# Patient Record
Sex: Male | Born: 1955
Health system: Southern US, Community
[De-identification: ages and names within clinical notes are randomized; demographics above are authoritative.]

## PROBLEM LIST (undated history)

## (undated) DIAGNOSIS — I739 Peripheral vascular disease, unspecified: Secondary | ICD-10-CM

## (undated) DIAGNOSIS — Z992 Dependence on renal dialysis: Secondary | ICD-10-CM

## (undated) DIAGNOSIS — I10 Essential (primary) hypertension: Principal | ICD-10-CM

## (undated) DIAGNOSIS — D638 Anemia in other chronic diseases classified elsewhere: Secondary | ICD-10-CM

## (undated) DIAGNOSIS — N186 End stage renal disease: Secondary | ICD-10-CM

## (undated) DIAGNOSIS — I2510 Atherosclerotic heart disease of native coronary artery without angina pectoris: Secondary | ICD-10-CM

## (undated) DIAGNOSIS — I351 Nonrheumatic aortic (valve) insufficiency: Secondary | ICD-10-CM

## (undated) HISTORY — DX: Essential (primary) hypertension: I10

## (undated) HISTORY — DX: Nonrheumatic aortic (valve) insufficiency: I35.1

## (undated) HISTORY — DX: End stage renal disease: Z99.2

## (undated) HISTORY — PX: CARDIAC CATHETERIZATION: SHX172

## (undated) HISTORY — PX: TIBIA FRACTURE SURGERY: SHX806

## (undated) HISTORY — DX: Atherosclerotic heart disease of native coronary artery without angina pectoris: I25.10

## (undated) HISTORY — DX: End stage renal disease: N18.6

## (undated) HISTORY — DX: Anemia in other chronic diseases classified elsewhere: D63.8

## (undated) HISTORY — DX: Peripheral vascular disease, unspecified: I73.9

## (undated) HISTORY — PX: PROSTATE SURGERY: SHX751

---

## 2002-11-25 ENCOUNTER — Inpatient Hospital Stay (HOSPITAL_COMMUNITY): Admission: EM | Admit: 2002-11-25 | Discharge: 2002-11-28 | Payer: Self-pay | Admitting: Cardiology

## 2002-11-26 ENCOUNTER — Encounter: Payer: Self-pay | Admitting: Cardiology

## 2003-06-06 ENCOUNTER — Ambulatory Visit (HOSPITAL_COMMUNITY): Admission: RE | Admit: 2003-06-06 | Discharge: 2003-06-06 | Payer: Self-pay | Admitting: Internal Medicine

## 2005-12-11 ENCOUNTER — Encounter: Payer: Self-pay | Admitting: Cardiology

## 2005-12-11 ENCOUNTER — Ambulatory Visit: Payer: Self-pay | Admitting: Cardiology

## 2005-12-18 ENCOUNTER — Ambulatory Visit: Payer: Self-pay | Admitting: Cardiology

## 2005-12-18 ENCOUNTER — Encounter: Payer: Self-pay | Admitting: Cardiology

## 2005-12-23 ENCOUNTER — Encounter: Payer: Self-pay | Admitting: Cardiology

## 2006-05-22 ENCOUNTER — Ambulatory Visit: Payer: Self-pay | Admitting: Cardiology

## 2006-05-26 ENCOUNTER — Ambulatory Visit: Payer: Self-pay | Admitting: Cardiology

## 2006-06-10 ENCOUNTER — Ambulatory Visit: Payer: Self-pay | Admitting: Cardiology

## 2007-08-07 ENCOUNTER — Ambulatory Visit: Payer: Self-pay | Admitting: Cardiology

## 2007-09-01 ENCOUNTER — Ambulatory Visit: Payer: Self-pay | Admitting: Cardiology

## 2008-11-16 ENCOUNTER — Ambulatory Visit: Payer: Self-pay | Admitting: Cardiology

## 2008-11-23 ENCOUNTER — Encounter: Payer: Self-pay | Admitting: Cardiology

## 2008-11-23 ENCOUNTER — Ambulatory Visit: Payer: Self-pay | Admitting: Cardiology

## 2009-04-07 DIAGNOSIS — I38 Endocarditis, valve unspecified: Secondary | ICD-10-CM | POA: Insufficient documentation

## 2009-04-07 DIAGNOSIS — I10 Essential (primary) hypertension: Secondary | ICD-10-CM | POA: Insufficient documentation

## 2009-07-26 ENCOUNTER — Telehealth (INDEPENDENT_AMBULATORY_CARE_PROVIDER_SITE_OTHER): Payer: Self-pay | Admitting: *Deleted

## 2010-01-29 ENCOUNTER — Encounter: Payer: Self-pay | Admitting: Cardiology

## 2010-02-12 ENCOUNTER — Encounter: Payer: Self-pay | Admitting: Physician Assistant

## 2010-02-12 ENCOUNTER — Ambulatory Visit: Payer: Self-pay | Admitting: Cardiology

## 2010-02-12 DIAGNOSIS — I351 Nonrheumatic aortic (valve) insufficiency: Secondary | ICD-10-CM | POA: Insufficient documentation

## 2010-02-12 DIAGNOSIS — R609 Edema, unspecified: Secondary | ICD-10-CM | POA: Insufficient documentation

## 2010-02-12 DIAGNOSIS — R079 Chest pain, unspecified: Secondary | ICD-10-CM | POA: Insufficient documentation

## 2010-02-20 ENCOUNTER — Encounter: Payer: Self-pay | Admitting: Cardiology

## 2010-02-20 ENCOUNTER — Ambulatory Visit: Payer: Self-pay | Admitting: Cardiology

## 2010-02-22 ENCOUNTER — Encounter (INDEPENDENT_AMBULATORY_CARE_PROVIDER_SITE_OTHER): Payer: Self-pay | Admitting: *Deleted

## 2010-03-19 ENCOUNTER — Ambulatory Visit: Payer: Self-pay | Admitting: Physician Assistant

## 2010-08-21 NOTE — Assessment & Plan Note (Signed)
Summary: 2 WK F/U PER 7/25 OV-JM   Visit Type:  Follow-up Primary Kendalynn Wideman:  [facility] Department   History of Present Illness: patient returns for scheduled early followup, and review of a 2-D echo for reassessment of known aortic regurgitation.  Patient has history of nonobstructive CAD by catheterization in 2004, and a negative exercise stress test in 2007. He has documented history of mild aortic root dilatation, by previous echocardiography.  Current echocardiogram, however, indicated stable aortic root, with diameter only 33 mm. Aortic regurgitation was assessed as mild/moderate, by Dr. Dannielle Burn.  Clinically, patient feels somewhat better, with respect to long standing history of chest and thoracic pain, which he has had since 2004.  Preventive Screening-Counseling & Management  Alcohol-Tobacco     Smoking Status: quit     Year Quit: 2000  Current Medications (verified): 1)  Aspir-Low 81 Mg Tbec (Aspirin) .... Take 1 Tablet By Mouth Once A Day 2)  Calan Sr 180 Mg Cr-Tabs (Verapamil Hcl) .... Take 1 Tablet By Mouth Once A Day 3)  Atenolol-Chlorthalidone 50-25 Mg Tabs (Atenolol-Chlorthalidone) .... Take 1 Tablet By Mouth Once A Day 4)  Extra Strength Acetaminophen 500 Mg Caps (Acetaminophen) .... As Needed 5)  Accupril 20 Mg Tabs (Quinapril Hcl) .... Take 1 Tablet By Mouth Once A Day 6)  Norvasc 10 Mg Tabs (Amlodipine Besylate) .... Take 1 Tablet By Mouth Once A Day 7)  Nitrostat 0.4 Mg Subl (Nitroglycerin) .... Use As Directed 8)  Isosorbide Mononitrate Cr 30 Mg Xr24h-Tab (Isosorbide Mononitrate) .... Take 1 Tablet By Mouth Once A Day 9)  Ibuprofen 200 Mg Tabs (Ibuprofen) .... As Needed 10)  Methocarbamol 500 Mg Tabs (Methocarbamol) .... Take 2 By Mouth Every Six Hours As Needed  Allergies (verified): No Known Drug Allergies  Comments:  Nurse/Medical Assistant: The patient's medication bottles and allergies were reviewed with the patient and were updated in  the Medication and Allergy Lists.  Past History:  Past Medical History: Last updated: 02/12/2010 Aortic Insufficiency - mild to moderate Hypertension Mitral Insufficiency - mild to moderate  Review of Systems       No fevers, chills, hemoptysis, dysphagia, melena, hematocheezia, hematuria, rash, claudication, orthopnea, pnd, pedal edema. All other systems negative.   Vital Signs:  Patient profile:   55 year old male Height:      72 inches Weight:      207 pounds Pulse rate:   64 / minute BP sitting:   162 / 79  (left arm) Cuff size:   regular  Vitals Entered By: [name] (March 19, 2010 1:20 PM)  Physical Exam  Additional Exam:  GEN:55 year old male, sitting upright, no distress HEENT: NCAT,PERRLA,EOMI NECK: palpable pulses, no bruits; no JVD; no TM LUNGS: CTA bilaterally HEART: RRR (S1S2); positive S4; no significant murmurs; no rubs; no gallops ABD: soft, NT; intact BS EXT: intact distal pulses; 1+ bilateral, pitting edema SKIN: warm, dry MUSC: no obvious deformity NEURO: A/O (x3)     Impression & Recommendations:  Problem # 1:  AORTIC VALVE DISORDERS (ICD-424.1)  recent echocardiogram indicates mild/moderate aortic regurgitation, with stable aortic root of only 33 mm. We'll reassess in one year, unless clinical status changes. We'll schedule return clinic visit with Dr. Domenic Polite in one year.  Problem # 2:  HYPERTENSION, UNSPECIFIED (ICD-401.9)  patient is due to followup at the health clinic next week. I emphasized the importance of blood pressure control, and to remain aware of what his pressure is. He is on 4 antihypertensives, we  will defer to his [facility] team at the health department for management of his blood pressure.  Problem # 3:  CHEST PAIN (ICD-786.50)  no further cardiac testing indicated at this time. Recent echocardiogram indicated normal wall motion. Continue low-dose aspirin, in addition to current medication regimen.  Patient  Instructions: 1)  Your physician wants you to follow-up in: 1 year. You will receive a reminder letter in the mail one-two months in advance. If you don't receive a letter, please call our office to schedule the follow-up appointment. 2)  Your physician recommends that you continue on your current medications as directed. Please refer to the Current Medication list given to you today. 3)  No salt added diet

## 2010-08-21 NOTE — Letter (Signed)
Summary: Appointment -missed  [facility] at [place]. [address] [place], [place] [address]   Phone: [phone]  Fax: [phone]     January 29, 2010 MRN: [id number]     [facility] [address]  [place], [place]  [address]     Dear Mr. MAMONE,  [place] records indicate you missed your appointment on January 29, 2010                        with Dr.   Domenic Polite.   It is very important that we reach you to reschedule this appointment. We look forward to participating in your health care needs.   Please contact us at the number listed above at your earliest convenience to reschedule this appointment.   Sincerely,    Public relations account executive

## 2010-08-21 NOTE — Assessment & Plan Note (Signed)
Summary: 1 yr fu-missed last aPPT-VS   Visit Type:  Follow-up Primary Provider:  [facility]   History of Present Illness: 55 year old male presents for followup.  Patient returns to the clinic since last seen in April 2010, by Dr. Domenic Gordon. A followup 2-D echo was ordered at that time, for monitoring of known aortic regurgitation. This suggested mild/moderate severity, with continued normal LVF (EF 65-70%). RV function was normal.  Clinically, [name] presents with a variety of complaints; namely, recurrent "back" pain, radiating to his chest and left thoracic region, as well as daily chest pain, both with and without exertion. He reportedly has run out of his nitroglycerin supply. He also was wondering about his "aneurysm" in his heart, as well as his "blockages". Review of previous records indicate that he had nonobstructive CAD by catheterization in May of 2004, with a negative exercise stress test in 2007. There was mention of mild aortic root dilatation by previous echocardiography. However, none was mentioned during this most recent study in May of last year.  Preventive Screening-Counseling & Management  Alcohol-Tobacco     Smoking Status: quit     Year Quit: 2000  Current Medications (verified): 1)  Aspir-Low 81 Mg Tbec (Aspirin) .... Take 1 Tablet By Mouth Once A Day 2)  Calan Sr 180 Mg Cr-Tabs (Verapamil Hcl) .... Take 1 Tablet By Mouth Once A Day 3)  Atenolol 50 Mg Tabs (Atenolol) .... Take 1 Tablet By Mouth Once A Day 4)  Tylenol Pm Extra Strength 500-25 Mg Tabs (Diphenhydramine-Apap (Sleep)) .... Take 2 Tablet By Mouth Once A Day At Bedtime 5)  Accupril 20 Mg Tabs (Quinapril Hcl) .... Take 1 Tablet By Mouth Once A Day 6)  Norvasc 10 Mg Tabs (Amlodipine Besylate) .... Take 1 Tablet By Mouth Once A Day 7)  Nitrostat 0.4 Mg Subl (Nitroglycerin) .... Use As Directed 8)  Isosorbide Mononitrate Cr 30 Mg Xr24h-Tab (Isosorbide Mononitrate) .... Take 1  Tablet By Mouth Once A Day 9)  Ibuprofen 200 Mg Tabs (Ibuprofen) .... As Needed  Allergies (verified): No Known Drug Allergies  Comments:  Nurse/Medical Assistant: The patient's medications and allergies were verbally reviewed with the patient and were updated in the Medication and Allergy Lists.  Past History:  Social History: Last updated: 04/07/2009 Single  Tobacco Use - Former.  Alcohol Use - no Drug Use - no  Past Medical History: Aortic Insufficiency - mild to moderate Hypertension Mitral Insufficiency - mild to moderate  Clinical Review Panels:  Echocardiogram Echocardiogram Conclusions:         1. Moderate concentric left ventricular hypertrophy is observed.         2. The EF is 65-70%. There is slight chordal SAM of the mitral valve,         but no LVOT gradient.         3. The right ventricular global systolic function is normal.         4. The aortic valve is trileaflet.         5. Systolic excursion of the aortic valve is normal.         6. There is mild/moderate AI that is central. (11/23/2008)    Review of Systems       No fevers, chills, hemoptysis, dysphagia, melena, hematocheezia, hematuria, rash, or claudication. patient reports having had an area of redness on the left side of his chest one week ago, and was concerned that there was underlying bleeding. He did not  seek medical attention for this. complains that he is short of breath "all the time", and cites chronic peripheral edema and occasional orthopnea. All other systems negative.   Vital Signs:  Patient profile:   55 year old male Height:      72 inches Weight:      209 pounds BMI:     28.45 Pulse rate:   67 / minute BP sitting:   157 / 79  (left arm) Cuff size:   regular  Vitals Entered By: Rodney Gordon (February 12, 2010 3:55 PM)  Nutrition Counseling: Patient's BMI is greater than 25 and therefore counseled on weight management options.  Physical Exam  Additional Exam:   GEN:55 year old male, sitting upright, no distress HEENT: NCAT,PERRLA,EOMI NECK: palpable pulses, no bruits; no JVD; no TM LUNGS: CTA bilaterally HEART: RRR (S1S2); positives S4; no significant murmurs; no rubs; no gallops ABD: soft, NT; intact BS EXT: intact distal pulses; 1+ bilateral, pitting edema SKIN: warm, dry MUSC: no obvious deformity NEURO: A/O (x3)     EKG  Procedure date:  02/12/2010  Findings:      normal sinus rhythm at 66 bpm; normal axis; LVH pattern  Impression & Recommendations:  Problem # 1:  CHEST PAIN (ICD-786.50)  patient complains of atypical chest pain, previously evaluated by cardiac catheterization in 2004, yielding nonobstructive CAD. A stress test in 2007 was negative for evidence of definite ischemia. Resting EKG today shows no ischemic changes. We'll defer repeat ischemic testing at this time, pending review of a 2-D echo which we will order, for ongoing surveillance of documented aortic regurgitation. Schedule early clinic followup with myself and Dr. Domenic Gordon in 2 weeks, for review of echocardiogram results and further recommendations.  Problem # 2:  VALVULAR HEART DISEASE (ICD-424.90)  repeat 2-D echo for reassessment of severity of aortic regurgitation.  Problem # 3:  HYPERTENSION, UNSPECIFIED (ICD-401.9) Assessment: Comment Only  Problem # 4:  EDEMA (ICD-782.3)  normal RV function by previous echo. May be exacerbated by Norvasc. Will defer to primary care team for monitoring and management.  Other Orders: EKG w/ Interpretation (93000) 2-D Echocardiogram (2D Echo)  Patient Instructions: 1)  FOLLOW UP APPT WITH Rodney Dinero Chavira, PA-C ON THURSDAY, March 01, 2010 AT 2PM. 2)  Your physician has requested that you have an echocardiogram.  Echocardiography is a painless test that uses sound waves to create images of your heart. It provides your doctor with information about the size and shape of your heart and how well your heart's chambers and valves  are working.  This procedure takes approximately one hour. There are no restrictions for this procedure. 3)  Your physician recommends that you continue on your current medications as directed. Please refer to the Current Medication list given to you today.

## 2010-08-21 NOTE — Progress Notes (Signed)
  Recieved Request for Records from [facility] forwarded to [facility] for processing [facility]  July 26, 2009 12:43 PM

## 2010-08-21 NOTE — Letter (Signed)
Summary: Risk analyst at [facility]. [address]   [place], [place] [address]   Phone: [phone]  Fax: [phone]        February 22, 2010 MRN: [id number]    [place]  [place], [place]  [address]     Dear Mr. ZAKOWSKI,  Your test ordered by Rande Lawman has been reviewed by your physician (or physician assistant) and was found to be normal or stable. Your physician (or physician assistant) felt no changes were needed at this time.  __X__ Echocardiogram   ____ Cardiac Stress Test  ____ Lab Work  ____ Peripheral vascular study of arms, legs or neck  ____ CT scan or X-Nigh  ____ Lung or Breathing test  ____ Other:   Thank you.   Gurney Maxin, RN, BSN    Bryon Lions, M.D., F.A.C.C. Maceo Pro, M.D., F.A.C.C. Cammy Copa, M.D., F.A.C.C. Vonda Antigua, M.D., F.A.C.C. Vita Barley, M.D., F.A.C.C. Mare Ferrari, M.D., F.A.C.C. Emi Belfast, PA-C

## 2010-12-04 NOTE — Assessment & Plan Note (Signed)
Rodney Gordon OFFICE NOTE   Rodney Gordon                        MRN:          [id number]  DATE:08/07/2007                            DOB:          24-Aug-1955    PRIMARY CARDIOLOGIST:  Listed as Dr. Dola Argyle.   PRIMARY CARE:  By Abran Cantor at the [facility]  Department.   REASON FOR VISIT:  Routine cardiac follow-up.   HISTORY OF PRESENT ILLNESS:  Rodney Gordon was seen last in late November  2007.  He has a history of previously documented nonobstructive coronary  artery disease with overall normal ventricle ejection fraction.  In  addition, he had valvular heart disease that has been followed over time  including mild to moderate aortic regurgitation, mild to moderate mitral  regurgitation, as well as mild aortic root dilatation.  This was last  assessed echocardiography in November 2007.  In terms of symptoms, he  has stable NYHA class II dyspnea on exertion.  He has no exertional  chest pain.  He does complain of a musculoskeletal sounding left-sided  back discomfort.  His electrocardiogram today shows normal sinus rhythm  at 68 beats per minute.  His blood pressure is better controlled than at  last visit although remains elevated.  He reports compliance with  medications.   ALLERGIES:  No known drug allergies.   MEDICATIONS:  Enteric-coated aspirin 81 mg p.o. daily.  Calan SR 108 mg  p.o. daily.  Imdur 30 mg p.o. daily.  Atenolol 50 mg p.o. daily.  Tylenol PM 2 nightly.  Accupril 10 mg daily.   REVIEW OF SYSTEMS:  Status post illness.  States his urine has been  somewhat dark recently.  Otherwise negative.   EXAMINATION:  Blood pressure is 175/85, heart rate 70 and regular.  Weight 211 pounds.  The patient is overweight no acute distress.  HEENT:  Conjunctiva is normal.  NECK:  Is supple.  No elevated expression of present or loud bruits.  Lungs are clear.  Without labored  breathing.  Cardiac exam reveals a regular rate and rhythm.  Soft systolic murmur at  the base.  No diastolic murmur.  No S3 gallop.  ABDOMEN:  Soft, nontender.  EXTREMITIES:  Exhibit no significant pitting edema distal pulse 2+.  SKIN:  Warm and dry.  MUSCULOSKELETAL:  No kyphosis noted.  NEUROPSYCHIATRIC:  The patient is alert and oriented x3.  Affect is  grossly normal.   IMPRESSION/RECOMMENDATIONS:  1. Valvular heart disease including mild to moderate aortic and mitral      regurgitation.  Last documented echocardiography in November 2007.      This also associated with mild aortic root dilatation.  The patient      was last seen by Rodney Gordon in clinic.  Plan at this point will be      to arrange a follow-up echocardiogram and anticipate a yearly      follow-up.  Assuming his valvular heart disease has not progressed      and also that his aortic root size is stable.  It is  of note, the      patient had a CT scan of his chest, also in 2007 that did not be      indicate aortic aneurysm.  2. Hypertension, better than at last visit although not optimally      controlled.  Medical regimen is reasonable.  I suggest that he      follow-up with Health Department regular basis for continued      medication titration as necessary.  I also suggested that he might      mention his urine changes and discuss possibility of urinalysis if      this continues.     Satira Sark, MD  Electronically Signed    SGM/MedQ  DD: 08/07/2007  DT: 08/07/2007  Job #: [id number]   cc:   Abran Cantor

## 2010-12-04 NOTE — Assessment & Plan Note (Signed)
[facility] OFFICE NOTE   GANT, GUSTASON                        MRN:          [id number]  DATE:11/16/2008                            DOB:          08-07-1955    PRIMARY CARE Vala Raffo:  [facility], [facility].   REASON FOR VISIT:  Scheduled followup.   HISTORY OF PRESENT ILLNESS:  Mr. Diegel returns for an annual visit in  followup of valvular heart disease including previously documented mild-  to-moderate aortic and mitral regurgitation in the setting of  hypertension.  There has also been some question about mild aortic root  dilatation, although this was not clearly documented by previous CT  angiography and in fact his followup echocardiogram done in February of  2009 revealed a normal aortic root diameter of 34 mm.  His valvular  heart disease has been stable and by that last echocardiogram, his  ejection fraction was 55-60% with mild-to-moderate aortic regurgitation  and mild mitral regurgitation.  Symptomatically, he reports NYHA class  II-III exertion.  He also describes some musculoskeletal sounding back  pain.  He has had lower extremity edema while on Norvasc, but no  orthopnea or PND.  I see that he is back on Accupril.  He has not had  any followup labs recently.   ALLERGIES:  No known drug allergies.   MEDICATIONS:  1. Enteric-coated aspirin 81 mg p.o. daily.  2. Calan SR 180 mg p.o. daily.  3. Atenolol 50 mg p.o. daily.  4. Tylenol PM 2 tablets p.o. at bedtime.  5. Accupril 10 mg p.o. daily.  6. Norvasc 10 mg p.o. daily.  7. Sublingual nitroglycerin 0.4 mg p.r.n.   REVIEW OF SYSTEMS:  As outlined above.  Otherwise reviewed negative.   PHYSICAL EXAMINATION:  VITAL SIGNS:  Blood pressure is 141/81, heart  rate is 62, and weight is 210 pounds.  GENERAL:  The patient is comfortable, and in no acute distress.  HEENT:  Conjunctivae are normal.  Oropharynx clear.  NECK:   Supple.  No elevated jugular venous pressure.  No loud bruits.  No thyromegaly noted.  LUNGS:  Clear without labored breathing at rest.  CARDIAC:  Reveals a regular rate and rhythm, 2/6 systolic murmur heard  at the base.  No loud diastolic murmur.  No S3 gallop.  ABDOMEN:  Soft  and nontender.  EXTREMITIES:  Exhibit 1+ edema below the knees, bilateral, symmetrical.  Distal pulses are 1-2+.  SKIN:  Warm and dry.  MUSCULOSKELETAL:  No kyphosis noted.  NEUROPSYCHIATRIC:  The patient alert and oriented x3.  Affect is  appropriate.   IMPRESSION AND RECOMMENDATIONS:  1. Valvular heart disease including mild-to-moderate aortic      regurgitation and mild mitral regurgitation.  A Followup      echocardiography will be obtained.  Overtime, ejection fraction has      been normal.  2. Lower extremity edema in the setting of hypertension.  This may be      a side effect of Norvasc.  We will obtain a followup BMET to ensure  normal renal function, as his blood pressure still has room for      control and it might be worth considering adding      hydrochlorothiazide to his present regimen for treatment of both      hypertension and his edema.  He has followup scheduled with a      Health Department soon and can discuss this at that time.     Satira Sark, MD  Electronically Signed    SGM/MedQ  DD: 11/16/2008  DT: 11/17/2008  Job #: [id number]   cc:   [place]

## 2010-12-07 NOTE — Assessment & Plan Note (Signed)
[place] OFFICE NOTE   Rodney Gordon, Rodney Gordon                        MRN:          [id number]  DATE:06/10/2006                            DOB:          24-Jul-1955    PRIMARY CARDIOLOGIST:  Carlena Bjornstad, M.D.   REASON FOR VISIT:  Scheduled one month follow up.  Please refer to my office  note of May 22, 2006 for full details.   At that time, the patient was referred for repeat echocardiogram for  continued monitoring of aortic regurgitation and mildly dilated aortic root.  A CT scan was also repeated in follow up of a previous scan in June of this  year which was abnormal and revealed new findings of small areas of  nodularity as well as minimal ground glass opacity.   The repeat CT scan showed an interval decrease in the areas of ground glass  opacity as well resolution of the nodularity.  It was felt that this most  likely reflected history of prior inflammatory infectious process.  No  further workup was recommended.   The echocardiogram showed continued normal left ventricular size/function  (EF 60-65%) with no definite wall motion abnormalities.  The aortic  insufficiency was felt to be mild/moderate which is unchanged from the  previous study of 2004.  Mild/moderate mitral regurgitation was also noted  as well as mild pulmonary hypertension.   From a clinical standpoint, the patient continues to report exertional  dyspnea but no frank exertional chest discomfort.  As noted at time of our  previous visit, the patient continues to remain focused on this left-sided  pain which is actually originating in his back.  He thinks it is traveling  all the way down to his left kidney and feels it is causing some damage.  As  noted in our previous visit, he feels this is also related to the aneurysm  which he has in his heart and which was previously sited to him.   CURRENT MEDICATIONS:  Unchanged,  save for down titration of aspirin to 81  daily.   PHYSICAL EXAMINATION:  Blood pressure initially 200/94 with a pulse of 56  and weight of 203.  GENERAL:  A 55 year old male sitting upright in no distress.  HEENT:  Normocephalic and atraumatic.  NECK:  Palpable bilateral carotid pulses without bruits.  LUNGS:  Clear to auscultation in all fields.  HEART:  Regular rate and rhythm.  S1-S2.  No significant murmurs.  No  diastolic blow.  No rubs.  EXTREMITIES:  Palpable distal pulses.  MUSCULOSKELETAL:  Increased discomfort and tenderness with movement of the  posterior thorax and left upper arm.   IMPRESSION:  1. Musculoskeletal pain.      a.     Associated with strenuous activity and movement of the thorax       and left arm and clearly associated with work and various jobs.  2. Nonobstructive coronary artery disease.      a.     Cardiac catheterization May of 2004.      b.  No definite ischemia by exercise stress test May 2007.  3. Uncontrolled hypertension.  4. History of mild aortic root dilatation.      a.     No significant aortic dilatation by current echocardiogram.  5. Aortic insufficiency.      a.     Stable by current echocardiogram with no significant change       compared to previous study.  6. History of medication noncompliance.  7. Abnormal chest CT scan.      a.     Stable by current repeat study.   PLAN:  We have repeated the patient's blood pressure readings in both arms  (200/98 left, 212/110 right).   The patient states that he did not take his medications early this morning  as he typically does but rather around noon time.  He has not had follow-up  with his primary care physician, as I had recommended at the time of his  last visit, with particular emphasis on his blood pressure control.  We will  therefore treat him with Clonidine here in the office to bring down his  blood pressure prior to discharging him.  However, I have strongly emphasize  that  he resume his relationship with the [facility]  with respect to his blood pressure and that he have early follow up and  reassessment of his antihypertensive regimen.   Regarding his musculoskeletal pain, I reassured him that the etiology of  this is not ischemic nor related to the mildly dilated aortic root, nor the  aortic regurgitation that has been well documented.  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  Of note, he is not  employed on a full-time basis and reports to me that his discomfort  dissipates when he has been out of work for several weeks at end.  I  therefore tried to explain to him that this is clearly related to overuse of  his muscles and may also reflect some inflammation of his nerve roots  perhaps.  In summary, I felt that at this point there is no clinical  indication to repeat stress test that he had earlier this year.   Following stabilization of his blood pressure, the patient will be cleared  for discharge and we will plan on continuing cardiology follow up in one  year with Dr. Carlena Bjornstad with a repeat  echocardiogram at that time.  However, he is to be more compliant with his  antihypertensives as well as schedule follow up [facility].      Rodney Stabile, PA-C  Electronically Signed      Carlena Bjornstad, MD, [facility]  Electronically Signed   GS/MedQ  DD: 06/10/2006  DT: 06/10/2006  Job #: [id number]   cc:   Abran Cantor, PA-C

## 2010-12-07 NOTE — Cardiovascular Report (Signed)
NAME:  Rodney Gordon, Rodney Gordon                           ACCOUNT NO.:  [id number]   MEDICAL RECORD NO.:  [id number]                   PATIENT TYPE:  INP   LOCATION:  2015                                 FACILITY:  [facility]   PHYSICIAN:  Christy Sartorius, M.D.                   DATE OF BIRTH:  11-27-1955   DATE OF PROCEDURE:  11/26/2002  DATE OF DISCHARGE:                              CARDIAC CATHETERIZATION   PROCEDURES PERFORMED:  1. Left heart catheterization.  2. Left ventriculogram.  3. Selective coronary angiography.  4. Aortic root angiogram.   DIAGNOSES:  1. Mild coronary atherosclerotic disease.  2. Normal left ventricular systolic function.  3. Aortic insufficiency, 1+.  4. Mild aortic root dilatation.   HISTORY:  The patient is a 55 year old black gentleman with a history of  dyslipidemia, hypertension, and moderate aortic insufficiency, who presents  with progressive chest discomfort.  The patient recently underwent stress  imaging study which showed a fixed inferobasal defect consistent with  diaphragmatic attenuation, otherwise well-perfused LV, with good exercise  capacity.  He did have a hypertensive response.  The patient, unfortunately,  has had recurrence of chest discomfort with exertion, and he was finally  admitted to [facility] with chest pain.  This was relieved with  nitroglycerin.  The patient, at this point, wished to proceed with further  assessment of cardiac catheterization.  He was subsequently transferred to  [facility] for cardiac catheterization to define his anatomy.   TECHNIQUE:  Informed consent was obtained.  The patient was brought to the  catheterization lab.  A 6-French sheath was placed in the right femoral  artery using the modified Seldinger technique.  Then 6-French JL4 and JR4  catheters were then used to engage the left and right coronary arteries, and  selective angiography performed in various projections using manual  injection of  contrast.  A 6-French pigtail catheter was then advanced into  the left ventricle and left ventriculogram performed using power injections  of contrast.  The pigtail catheter was brought back in the descending aorta  and angiogram of the root performed using power injections of contrast.  At  the termination of the case, catheters and sheath were removed, and manual  pressure applied until adequate hemostasis was achieved.  The patient  tolerated the procedure well and was transferred to the floor in stable  condition.   FINDINGS:  1. Left Main Trunk:  Large-caliber vessel, short in its extent,     angiographically normal.  2. LAD:  This is a large-caliber vessel caliber vessel and provides a major     first diagonal branch in the proximal segment.  The LAD then extends to     the apex.  The native LAD has mild irregularities.  The diagonal branch     has an ostial narrowing of 70%.  3. Left Circumflex Artery:  This is a  large-caliber vessel and provides 2     small marginal branches in the proximal segment and a large third     marginal branch distally.  The left circumflex system has mild     irregularities.  4. Right Coronary Artery:  The right coronary artery is dominant.  This is a     medium-caliber vessel that provides the posterior descending artery and     posterior ventricular branch in its terminal segment.  The right coronary     artery has mild disease of 30% in the proximal segment.  There is an     eccentric plaque of 30% in the mid section after an RV marginal branch.  5. Left Ventriculogram:  Normal end-systolic and end-diastolic dimensions.     Overall left ventricular function is well preserved.  Ejection fraction     is greater than 55%.  No mitral regurgitation.  LV pressure is 160/5,     aortic is 160/90.  LVEDP equals 10.  6. Aortic Root:  The ascending aortic root above the sinus of Valsalva is     mildly dilated.  There is 1+ aortic insufficiency.   ASSESSMENT  AND PLAN:  The patient is a 55 year old gentleman who presents  with substernal chest discomfort.  He has a 70% narrowing in a first  diagonal branch that appears to be 2.5 mm in diameter.  Treatment options  were discussed with the patient.  At this point we have elected to proceed  with medical therapy.  We will advance his antihypertensives as well as  antianginals, and at this point I will initiate therapy with Imdur.  We will  follow his blood pressure closely.  Should the patient have persistent or  worsening symptoms, consideration may be given towards percutaneous  intervention.  The patient's aortic insufficiency appears to be mild at this  point without LV impairment.  Continue medical therapy will be pursued.  This may be followed with serial echocardiograms.  The aortic root also  appears mildly dilated but not critically, and again further assessment with  imaging studies, such as CAT scan, may be beneficial in following this.                                               Christy Sartorius, M.D.    Archie Balboa  D:  11/26/2002  T:  11/29/2002  Job:  [id number]   cc:   Satira Sark, M.D. [facility]  [place]  [place] [address]  Fax: [phone]

## 2010-12-07 NOTE — Discharge Summary (Signed)
NAME:  Rodney Gordon, Rodney Gordon                           ACCOUNT NO.:  [id number]   MEDICAL RECORD NO.:  [id number]                   PATIENT TYPE:  INP   LOCATION:  2015                                 FACILITY:  [facility]   PHYSICIAN:  Marcello Moores C. Wall, M.D.                DATE OF BIRTH:  1955/11/25   DATE OF ADMISSION:  11/25/2002  DATE OF DISCHARGE:  11/28/2002                           DISCHARGE SUMMARY - REFERRING   REASON FOR ADMISSION:  Recurrent chest discomfort.   DISCHARGE DIAGNOSES:  1. Moderate coronary artery disease.     a. Cardiac catheterization this admission by Christy Sartorius, M.D. on Nov 26, 2002 revealing left main normal, left anterior descending 70% in first        diagonal, left circumflex with luminal disease, large third obtuse        marginal artery with luminal disease, right coronary artery with 30%        proximal and mid stenosis, left ventricular ejection fraction at        greater than 55%.     b. Medical therapy plan for first diagonal disease.  2. Mild to moderate aortic insufficiency.     a. At cardiac catheterization ascending aorta dilated with 1+ apical        impulse.  3. Hypertension.  4. Dyslipidemia with low HDL.  5. Arthritis.  6. Chronic headache.   PROCEDURES PERFORMED THIS ADMISSION:  Cardiac catheterization by Christy Sartorius, M.D. on Nov 26, 2002.   HOSPITAL COURSE:  Please see the dictated consult note done at [facility] by Satira Sark, M.D. for complete details.  Briefly, this 55 year old male was transferred from [facility] with complaints of recurrent chest pain on Nov 25, 2002.  His stress  test in April 2004 was negative.  He continued to have left chest pain.  At  [facility] he went for cardiac catheterization with Christy Sartorius, M.D.  The results are noted above.  He tolerated the procedure well and had no new  complications.  It was decided that medical therapy would be attempted  first.  If  he failed this then PCI could be considered in the future.  The  patient would need a follow up of his AI in the future with outpatient  echocardiograms.  He would need assessment of the size of his ascending  aorta with an outpatient CT scan of his chest.  The patient was watched over  the next two days on telemetry.  He had no further chest pain.  His blood  pressure was somewhat difficult to control.  His Norvasc and Altace were  both increased.  Imdur was added for medical therapy of his CAD.  In  transfer, the patient's HCTZ was held.  Due to increases and changes his  blood pressure medications this was not restarted at  discharge.  Dr. Johnsie Cancel  saw the patient on Nov 28, 2002 and thought he was ready for discharge to  home.   LABORATORY AND ACCESSORY DATA:  (At [facility]) On May 8th, sodium 128,  potassium 3.3, chloride 106, CO2 26, glucose 119, BUN 12, creatinine 1.2,  calcium 8.8.   Chest x-[place] without acute disease.   At [facility] his triglycerides were 242, cholesterol was 114, HDL  28, LDL 49.  INR was 1.  White count 8300, platelet count 303,000,  hemoglobin 13, hematocrit 38.  Troponin-I was negative x3.    DISCHARGE MEDICATIONS:  1. Coated aspirin 325 mg daily.  2. Imdur 30 mg daily.  3. Labetalol 200 mg twice daily.  4. Altace 10 mg daily.  5. Norvasc 10 mg daily.  6. Nitroglycerin p.r.n. chest pain.  7. The patient has been advised to stop taking his HCTZ for now.   PAIN MANAGEMENT:  The patient is to use nitroglycerin p.r.n. chest pain.  He  is to call our office in [place] or 9-1-1 with recurrent chest pain.   ACTIVITY:  No lifting, driving, sexual activity, or heavy exertion for two  days.  Slowly advance as tolerated.   DIET:  The patient is to maintain a low salt, low fat, low cholesterol diet.   WOUND CARE:  The patient is to observe his cath site for any bruising,  drainage, swelling, or discomfort and to call our office in [place] with   concerns.   FOLLOW UP:  The patient will see either Dr. Domenic Polite or Dr. Ron Parker or the P.A.  in [place] in the next two weeks.  The office should call him with an  appointment.  He has been provided with a phone number in case he is not  contacted.  He will need a chest CT done as an outpatient to evaluate the  size of his ascending aorta.  This can be set up at his follow up  appointment.  He will need repeat echocardiograms in the future to monitor  his aortic insufficiency.  As noted above, if he has recurrent chest  discomfort then PCI may be considered of his first diagonal.     Richardson Dopp, P.A.                        Thomas C. Wall, M.D.    SW/MEDQ  D:  11/28/2002  T:  11/29/2002  Job:  [id number]   cc:   Satira Sark, M.D. [facility]  [place]  [place] [address]  Fax: [phone]

## 2010-12-07 NOTE — H&P (Signed)
NAME:  Rodney Gordon, Rodney Gordon                           ACCOUNT NO.:  [id number]   MEDICAL RECORD NO.:  [id number]                   PATIENT TYPE:   LOCATION:                                       FACILITY:  [facility]   PHYSICIAN:  R. Garfield Cornea, M.D.              DATE OF BIRTH:  March 17, 1956   DATE OF ADMISSION:  05/31/2003  DATE OF DISCHARGE:                                HISTORY & PHYSICAL   REQUESTING PHYSICIAN:  Satira Sark, M.D.   CHIEF COMPLAINT:  Elevated LFTs, right upper quadrant abdominal pain.   HISTORY OF PRESENT ILLNESS:  Rodney Gordon is a 55 year old African-  American male who presents to our office with a 7-month history of  intermittent right upper quadrant abdominal pain.  Rodney Gordon describes his pain as  aching and only lasting a few minutes.  Rodney Gordon does have some occasional  nausea however, no emesis.  Rodney Gordon complains the pain is worse in the morning  prior to breakfast.  Rodney Gordon also states the pain increases with movement and  heavy lifting.  Rodney Gordon also has complaints of heartburn and indigestion.  Rodney Gordon is  currently not on PPI.  Rodney Gordon takes aspirin 325 mg daily for the last 8 months  approximately.  LFTs were obtained by Isaias Cowman, P.A.-C. in August 2004  where Rodney Gordon was found to have hyperbilirubinemia with mostly indirect as well  as an elevated SGPT.  Most recently LFTs were obtained on April 19, 2003  which revealed a total bilirubin of 1.9, direct bilirubin of 0.4, indirect  bilirubin of 1.5 and SGPT of 68, SGOT 36, alkaline phosphatase 78, albumin  4.3, total protein 8, triglycerides were elevated at 960.  Abdominal  ultrasound was performed March 15, 2003 which was normal without  cholelithiasis or CBD dilatation.  Apparently Rodney Gordon was started on statin  therapy for plaque stabilization end of May 2004, Lipitor 5 mg daily, which  was stopped approximately beginning of August.  Rodney Gordon denies any history of  hepatitis, drug use, tattoos, or sexual risks.  Rodney Gordon reports his bowel  movements have been normal without any blood, melena, or clay-colored  stools.  Rodney Gordon does occasionally take acetaminophen 500 mg which Rodney Gordon feels  alleviates some of the pain.   PAST MEDICAL HISTORY:  1. Hypertension.  2. Hyperlipidemia.  3. Coronary artery disease with reported 70% blockage.  4. Aortic regurgitation.  5. Osteoarthritis.   PAST SURGICAL HISTORY:  Left leg fracture.   CURRENT MEDICATIONS:  1. Labetalol 200 mg b.i.d.  2. Norvasc 10 mg daily.  3. Imdur 30 mg daily.  4. Aspirin 325 mg daily.  5. Nitroglycerin 0.4 mg sublingual p.r.n. chest pain.  6. Altace 10 mg daily.   ALLERGIES:  No known drug allergies.   FAMILY HISTORY:  No known family history of colorectal carcinoma or other  chronic GI problems.  Mother is alive at age 28 with  CAD.  Father is  deceased in his 77s secondary to cerebral aneurysm.  Rodney Gordon has one sister and  five brothers all of which are in good health.   SOCIAL HISTORY:  Rodney Gordon is currently single.  Rodney Gordon is unemployed  except for Rodney Gordon reports Rodney Gordon does occasional yard work.  Rodney Gordon currently is a  nonsmoker, quitting 3 years ago with a one-pack-per-day for 17-year history.  Rodney Gordon denies any alcohol or drug use.   REVIEW OF SYSTEMS:  CONSTITUTIONAL:  Weight is slightly increasing.  CARDIOVASCULAR:  Rodney Gordon does report occasional chest pain and palpitation;  however, Rodney Gordon is being followed by Dr. Myles Gip office.  See HPI.  PULMONARY:  Rodney Gordon reports occasional shortness of breath on exertion.  GU:  Rodney Gordon  denies any dysuria, hematuria, or increased urinary frequency.  GI:  See  HPI.   PHYSICAL EXAMINATION:  VITAL SIGNS:  Weight 203 pounds, height 73 inches,  temperature 98, blood pressure 164/90, pulse 70.  GENERAL:  Rodney Gordon is a 55 year old African-American male who is  alert, pleasant, oriented, cooperative and in no acute distress.  HEENT:  Sclerae clear, nonicteric.  Conjunctivae pink.  Neck supple without  any mass or thyromegaly.  Oropharynx pink  and moist without any lesions.  CHEST:  Heart regular rate and rhythm without murmurs, clicks, rubs, or  gallops.  Lungs clear to auscultation bilaterally.  ABDOMEN:  Positive bowel sounds x4, soft, nontender, nondistended, with no  palpable organomegaly.  RECTAL:  There are no visible external hemorrhoids.  Rodney Gordon does have a small  amount of light brown heme-positive stool.  EXTREMITIES:  Two plus pedal pulses bilaterally.  No pedal edema.  SKIN:  Brown, warm, and dry without any jaundice.   LABORATORY STUDIES:  See HPI.  CBC from October 01, 2002 - WBC 8.5, hemoglobin  13.4, hematocrit 38.7, platelets 272.  Glucose 108, BUN 11, creatinine 1,  calcium 8.4, sodium 137, potassium 3.7, chloride 108, CO2 26.   ASSESSMENT:  Rodney Gordon is a 55 year old African-American male with  several would appear to be different concerns today.   Problem 1. HYPERBILIRUBINEMIA WITH MOSTLY INDIRECT AS WELL AS ELEVATED SGPT  PER LAST REPORT WITH OTHERWISE NORMAL TRANSAMINASES.  This is more likely to  be hepatocellular in origin and can possibly be related to Lipitor, however,  I believe it has been a few months since Rodney Gordon has discontinued this.  We will  recheck liver enzymes today and if remain elevated we will check for  hepatitis B and C as well as iron, TIBC, and ferritin.   Problem 2. RIGHT UPPER QUADRANT PAIN.  Rodney Gordon does appear to have some upper  gastrointestinal symptoms including reflux, indigestion and dyspeptic  symptoms along with Hemoccult-positive stool.  Initially I would like to  start him on proton pump inhibitor today as well as schedule him for EGD  with Dr. Gala Romney given heme-positive stools today.  I have discussed this  procedure with Mr. Montemurro including risks and benefits which include but are  not limited to bleeding, perforation, infection, and Rodney Gordon agrees with this  plan.  Rodney Gordon is to hold his aspirin for 3 days prior to the procedure. Etiology of his right upper quadrant pain is very  unclear, it does not  appear to be of biliary obstruction nature.  We will rule out peptic ulcer  disease and if this is negative, would consider musculoskeletal origin as  well.   RECOMMENDATIONS:  1. LFTs today, if they remain  elevated we will do further labs as above.  2. Protonix 40 mg (#30) to be taken 30 minutes before breakfast with three     refills; I have also given him (#20) samples.  3. EGD is scheduled with Dr. Gala Romney.  Hold aspirin for 3 days.  4. Further recommendations pending procedure and laboratory results.   We would like to thank Dr. Domenic Polite, [facility] cardiologist.     _____________________________________  ___________________________________________  Les Pou, N.P.               Bridgette Habermann, M.D.   KC/MEDQ  D:  05/31/2003  T:  05/31/2003  Job:  [id number]   cc:   Satira Sark, M.D. [facility]

## 2010-12-07 NOTE — Op Note (Signed)
NAME:  Rodney Gordon, Rodney Gordon                           ACCOUNT NO.:  [id number]   MEDICAL RECORD NO.:  [id number]                   PATIENT TYPE:  AMB   LOCATION:  DAY                                  FACILITY:  [facility]   PHYSICIAN:  R. Garfield Cornea, M.D.              DATE OF BIRTH:  1955/10/26   DATE OF PROCEDURE:  DATE OF DISCHARGE:  06/06/2003                                 OPERATIVE REPORT   PROCEDURE:  Diagnostic esophagogastroduodenoscopy.   INDICATIONS:  The patient is a 55 year old gentleman with upper abdominal  discomfort, indigestion and hemoccult positive stool.  Recent ultrasound  of the right upper quadrant was negative.  Gallbladder in situ.  He has had  a mild bump in his LFTs from May 31, 2003 and a total bilirubin of 2.5,  direct 0.6, indirect 1.6, SGOT 40, SGPT 94.  Viral markers and parent  studies are pending through our office.  He was on Lipitor but this was  stopped.  He was started on Protonix 40 mg early daily through my office  recently with some significant improvement, but not complete resolution of  his abdominal discomfort.  An EGD is now being done to further evaluate his  symptoms .  This approach has been discussed with the patient at length  previously and again at bedside.  The potential risks, benefits and  alternatives have been reviewed, questions answered.  Please see my dictated  consultation note of May 31, 2003.   DESCRIPTION OF PROCEDURE:  Oxygen saturation, blood pressure, pulse and  respiration were monitored throughout the entire procedure.  Conscious  sedation with Versed 3 mg IV, Demerol 75 mg IV in divided doses, ampicillin  2 g IV, gentamicin 140 mg IV for SBE prophylaxis. The instrument was the  Olympus video tip adult gastroscope.   FINDINGS:  Esophagus:  Examination of the tubular esophagus revealed no  mucosal abnormalities. The EG junction was easily traversed.   Stomach:  The gastric cavity was empty and insufflated well  with air.  A  thorough examination of the gastric mucosa including the retroflexed view of  the proximal stomach and esophagogastric junction demonstrated only a small  hiatal hernia and a couple of antral erosions.  Pylorus was patent and  easily traversed.   Duodenum:  Bulb and second portion appeared normal.   Therapy and diagnostic maneuvers:  None.   The patient tolerated the procedure well and was reactive after endoscopy.   IMPRESSION:  1. Normal esophagus.  2. Small hiatal hernia.  3. Antral erosions of uncertain significance the remainder of the gastric     mucosa remained normal.  4. Normal D1 and D2.   RECOMMENDATIONS:  1. Will continue Protonix 40 mg early daily for now.  2. Check H pylori serology.  3  Follow up on pending studies to further evaluate his elevated liver  functions.  1. He really needs  to have a colonoscopy to further evaluate hemoccult-     positive stool.  Will arrange this in the near future.  Further     recommendations to follow.      ___________________________________________                                            Bridgette Habermann, M.D.   RMR/MEDQ  D:  06/06/2003  T:  06/06/2003  Job:  [id number]   cc:   Satira Sark, M.D. [facility]

## 2010-12-07 NOTE — Assessment & Plan Note (Signed)
[place] OFFICE NOTE   Rodney Gordon, Rodney Gordon                        MRN:          [id number]  DATE:05/22/2006                            DOB:          03/07/1956    Primary cardiologist is Dr. Dola Argyle.   REASON FOR OFFICE VISIT:  Scheduled 21-month followup.   Since last seen here in the clinic in May of this year by Roque Cash, PA-C,  the patient was subsequently referred for a chest CT scan for continued  monitoring of a previously documented dilated ascending aorta.  This was  mentioned by Dr. Lyndel Safe during coronary angiography in May of 2004, at which  time he quantitated the aortic root as mildly dilated with an associated 1+  aortic insufficiency.   The recent chest CT scan in June of this year, however, makes no mention of  a dilated aorta, nor any aortic or SVC lesion.  There is mention, however,  of the possibility of granulomatous disease, which appears to be new,  consisting of small nodular areas in the left lung base as well as some  minimal ground-glass opacity.  It was noted that this appeared to be new,  and a repeat study in 1-3 months was recommended.  Of note, there was no  evidence of pulmonary embolism.   The patient was also referred for an exercise stress Cardiolite, during  which the patient exercised over 7 minutes, achieving 83% of PMHR, and with  no definite evidence of ischemia per perfusion imaging; calculated ejection  fraction 51%.   Although the patient denies any frank exertional chest discomfort when  walking up a flight of stairs or up a slight incline, he seems to be quite  focused on this occasional left-sided thoracic chest discomfort, which is  intermittent in nature.  He uses the word dissection when referring to  this pain, and also was concerned that it may damaging his kidneys.  He  otherwise has some exertional dyspnea, which is not new, and denies PND,  orthopnea or lower extremity edema.  He also denies any tachy palpitations.   The patient does not smoke tobacco.   The patient has history of mixed dyslipidemia with elevated  triglycerides/low HDL, and is not currently on any lipid-lowering agent.  However, he die report history of elevated liver enzymes, and we do have  documented elevated LFTs from 2004.  However, the patient denies having had  a formal GI evaluation.   CURRENT MEDICATIONS:  1. Coated aspirin 325 mg daily.  2. Calan SR 180 mg daily.  3. Imdur 30 mg daily.  4. Atenolol 50 mg daily.  5. Acetaminophen 2 tablets nightly.   PHYSICAL EXAMINATION:  Blood pressure 158/78, pulse 64, regular.  Weight 202  (unchanged).  GENERAL:  A 55 year old male, sitting upright in no apparent distress.  NECK:  Palpable bilateral carotid pulses without bruits.  LUNGS:  Clear to auscultation all fields.  HEART:  Regular rate and rhythm (S1, S2), no significant murmurs.  No  diastolic blow.  No rubs.  ABDOMEN:  Soft, nontender, with intact bowel sounds.  EXTREMITIES:  Palpable distal pulses without edema.  NEUROLOGIC:  No focal deficits.   IMPRESSION:  1. Nonobstructive coronary artery disease.      a.     Cardiac catheterization May 2004.      b.     Normal left ventricular function.      c.     No definite ischemia by exercise stress testing May 2007.  2. History of mildly dilated aortic root.      a.     By cardiac catheterization 2004.      b.     Mild/moderate aortic insufficiency by echocardiogram March 2004.  3. Atypical chest pain.  4. Mixed dyslipidemia.      a.     History of elevated liver enzymes.  5. Hypertension.      a.     Intolerant to multiple medications, including Accupril and, most       recently, Norvasc.  6. History of medication noncompliance.   PLAN:  1. Repeat chest CT scan, as recommended, for reassessment of newly      diagnosed possible granulomatous disease found on chest CT scan this      past  June.  We will also see if this makes any mention of a possibly      dilated aortic root.  2. Repeat 2D echocardiogram for reassessment of left ventricular function,      again with focus on the aortic root, and reassessment of aortic      regurgitation.  3. The patient strongly advised to resume followup with his [name].  4. Decrease aspirin to 81 mg daily.  5. Schedule return clinic followup with myself/Dr. Dola Argyle in 1      month.      Mannie Stabile, PA-C  Electronically Signed      Satira Sark, MD  Electronically Signed   GS/MedQ  DD: 05/22/2006  DT: 05/23/2006  Job #: [id number]   cc:   Rodney Cantor, PA-C

## 2011-04-12 ENCOUNTER — Encounter: Payer: Self-pay | Admitting: Cardiology

## 2011-04-15 ENCOUNTER — Ambulatory Visit (INDEPENDENT_AMBULATORY_CARE_PROVIDER_SITE_OTHER): Payer: Self-pay | Admitting: Physician Assistant

## 2011-04-15 ENCOUNTER — Encounter: Payer: Self-pay | Admitting: Cardiology

## 2011-04-15 DIAGNOSIS — R002 Palpitations: Secondary | ICD-10-CM | POA: Insufficient documentation

## 2011-04-15 DIAGNOSIS — R079 Chest pain, unspecified: Secondary | ICD-10-CM

## 2011-04-15 DIAGNOSIS — I38 Endocarditis, valve unspecified: Secondary | ICD-10-CM

## 2011-04-15 DIAGNOSIS — R0989 Other specified symptoms and signs involving the circulatory and respiratory systems: Secondary | ICD-10-CM

## 2011-04-15 DIAGNOSIS — I10 Essential (primary) hypertension: Secondary | ICD-10-CM

## 2011-04-15 NOTE — Assessment & Plan Note (Addendum)
Recommend a followup 2D echo for reassessment of LVF and severity of AR.

## 2011-04-15 NOTE — Assessment & Plan Note (Signed)
Uncontrolled. Will refer pt to [facility] this week, for reassessment and further recommendations. Pt unable to tolerate Calan.

## 2011-04-15 NOTE — Patient Instructions (Addendum)
   Need to be seen at [facility] Department this week for blood pressure management.  Call office when/if disability approved   Application given for Rodney Gordon financial assistance   Will hold off on further testing at this point to see if patient gets insurance coverage (Echo, Carotid Dopplers, 21 day Cardionet, & labs) Your physician wants you to follow up in: 6 months.  You will receive a reminder letter in the mail one-two months in advance.  If you don't receive a letter, please call our office to schedule the follow up appointment

## 2011-04-15 NOTE — Progress Notes (Signed)
Clinical Summary [name] is a 55 y.o.male presenting for followup. He was seen in August of last year.  Since his last visit, [name] suggests somewhat more pronounced DOE. Although he has lost about 10 lbs, he reports occasional PND, orthopnea, and chronic LE edema. He also has longstanding h/o CP, with neg CAD in 2004, and a neg GXT CL in 2007. He seems to suggest a decrease in frequency/intensity, but continues to have occasional CP with moderate exertion. He also reports occasional palpitations, but no fluttering, which occurs about 2x/week. He also experiences "cramps", and is on a diuretic.  He presents with uncontrolled HTN, and reports having recently stopped Calan, secondary to GI upset. He reports systolic readings at home in the 130-150 range. He took only a few of his scheduled antihypertensives this AM, because he hasn't yet had something to eat.  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   Allergies not on file  Medication list reviewed.  Past Medical History  Diagnosis Date  . Aortic insufficiency     Mild to moderate  . Essential hypertension, benign   . Coronary atherosclerosis of native coronary artery     Nonobstructive at catheterization 2004    Past Surgical History  Procedure Date  . Tibia fracture surgery     Left    Family History  Problem Relation Age of Onset  . Cancer Other   . Coronary artery disease Other     Social History [name] reports that he has quit smoking. His smoking use included Cigarettes. He has never used smokeless tobacco. [name] reports that he does not drink alcohol.  Review of Systems Negative for ower extremity edema, presyncope/syncope, claudication, reflux, hematuria, hematochezia, or melena. Remaining systems reviewed, and are negative.    Physical Examination There were no vitals filed for this visit.  GENERAL: well-nourished, well-developed; NAD HEENT: NCAT,  PERRLA, EOMI; sclera clear; no xanthelasma NECK: palpable bilateral carotid pulses, soft right sided supero-/infraclavicular bruit; no JVD; no TM LUNGS: CTA bilaterally CARDIAC: RRR (S1, S2); no significant murmurs; no rubs or gallops ABDOMEN: soft, non-tender; intact BS EXTREMETIES: intact distal pulses; 1-2+, bilateral peripheral edema SKIN: warm/dry; no obvious rash/lesions MUSCULOSKELETAL: no joint deformity NEURO: no focal deficit; NL affect  ECG see EKG section   Studies Echocardiogram 02/20/2010: LVEF 55-60%, mildly thickened aortic valve leaflets with mild to moderate aortic regurgitation, mild tricuspid regurgitation, aortic root 33 mm.   Problem List and Plan

## 2011-04-15 NOTE — Assessment & Plan Note (Addendum)
Recommend carotid Dopplers for further assessment of right sided supra/infraclavicular bruit. Patient has no known history of CVD. This may be secondary to his known AR.

## 2011-04-15 NOTE — Assessment & Plan Note (Signed)
Recommend a 21 day CardioNet monitor, to rule out dysrhythmia.

## 2011-04-15 NOTE — Assessment & Plan Note (Signed)
Patient has long-standing history of atypical CP, with negative CAD, 2004, and a negative GXT Cardiolite, 2007. Will consider reassessment, pending review of echocardiogram results. Of note, patient states he would not be able to tolerate a GXT~stress test.

## 2011-07-05 ENCOUNTER — Encounter: Payer: Self-pay | Admitting: *Deleted

## 2011-07-05 ENCOUNTER — Telehealth: Payer: Self-pay | Admitting: *Deleted

## 2011-07-05 NOTE — Telephone Encounter (Signed)
Pt was seen in the office by Gene on 9/24. He had an Echo, Carotid Dopplers, 21 day Cardionet, & labs ordered at that time. Pt did not have insurance coverage. He was trying to get disability and was also given information regarding [facility] assistance. He was to call the office to schedule tests when this was completed. He is also due for follow up appt with Dr. Domenic Polite in March.  Pt did not contact the office and tests have not been completed. He needs appt scheduled for March. Attempted to reach pt to discuss further. Pt's number is not in service. A letter will be mailed asking him to call the office. `

## 2011-07-18 NOTE — Telephone Encounter (Signed)
Pt called regarding test after letter received. Pt states he hasn't finalized his disability yet. He should be doing this in the next week and will bring a copy of card by the office once this is done so that tests can be scheduled.

## 2011-10-15 ENCOUNTER — Encounter: Payer: Self-pay | Admitting: Cardiology

## 2011-10-15 ENCOUNTER — Ambulatory Visit (INDEPENDENT_AMBULATORY_CARE_PROVIDER_SITE_OTHER): Payer: Medicaid Other | Admitting: Cardiology

## 2011-10-15 VITALS — BP 168/72 | HR 68 | Ht 72.0 in | Wt 192.0 lb

## 2011-10-15 DIAGNOSIS — R198 Other specified symptoms and signs involving the digestive system and abdomen: Secondary | ICD-10-CM

## 2011-10-15 DIAGNOSIS — R002 Palpitations: Secondary | ICD-10-CM

## 2011-10-15 DIAGNOSIS — I10 Essential (primary) hypertension: Secondary | ICD-10-CM

## 2011-10-15 DIAGNOSIS — I359 Nonrheumatic aortic valve disorder, unspecified: Secondary | ICD-10-CM

## 2011-10-15 NOTE — Patient Instructions (Signed)
Your physician wants you to follow-up in: 6 months. You will receive a reminder letter in the mail one-two months in advance. If you don't receive a letter, please call our office to schedule the follow-up appointment. Your physician recommends that you continue on your current medications as directed. Please refer to the Current Medication list given to you today. Your physician has requested that you have an echocardiogram. Echocardiography is a painless test that uses sound waves to create images of your heart. It provides your doctor with information about the size and shape of your heart and how well your heart's chambers and valves are working. This procedure takes approximately one hour. There are no restrictions for this procedure. If the results of your test are normal or stable, you will receive a letter. If they are abnormal, the nurse will contact you by phone.

## 2011-10-15 NOTE — Assessment & Plan Note (Signed)
Mild to moderate aortic regurgitation based on prior testing. Particularly in light of his hypertension and shortness of breath, followup complete echocardiogram to be obtained to reassess left ventricular function and degree of aortic regurgitation.

## 2011-10-15 NOTE — Assessment & Plan Note (Signed)
Chronic complaint, also associated with nausea intermittently. He does have apparent hepatomegaly on examination, cannot rule out other chronic intra-abdominal process. He states that he has had these symptoms since 2009. He plans to establish with [name] in the next few weeks, and I asked him to discuss these symptoms with him so that additional abdominal imaging can be obtained.

## 2011-10-15 NOTE — Progress Notes (Signed)
   Clinical Summary Rodney Gordon is a 56 y.o.male presenting for followup. He was seen by Rodney Gordon back in September 2012. Followup testing was recommended at that time to investigate reported progressive dyspnea on exertion, however this was never completed. Patient had no standing health insurance and was undergoing disability determinations at that point. At this point he has Medicaid, and is to establish with Rodney Gordon within the next few weeks.  He reports chronic dyspnea and exertion, although does not indicate any progression since last visit. Has occasional chest pain, also musculoskeletal sounding neck and shoulder discomfort. He reports compliance with the medications outlined below, did see the health Department on occasion. Followup ECG is reviewed below.  He also reports intermittent nausea, feeling of abdominal fullness.  He states that this has been a problem since "2009."  No Known Allergies  Current Outpatient Prescriptions  Medication Sig Dispense Refill  . acetaminophen (TYLENOL) 500 MG tablet Take 500 mg by mouth as needed.        Marland Kitchen amLODipine (NORVASC) 10 MG tablet Take 10 mg by mouth daily.        Marland Kitchen aspirin 81 MG tablet Take 81 mg by mouth daily.        Marland Kitchen atenolol-chlorthalidone (TENORETIC) 50-25 MG per tablet Take 1 tablet by mouth daily.        Marland Kitchen ibuprofen (ADVIL,MOTRIN) 200 MG tablet Take 200 mg by mouth as needed.        . methocarbamol (ROBAXIN) 500 MG tablet Take 1,000 mg by mouth every 6 (six) hours as needed.        . nitroGLYCERIN (NITROSTAT) 0.4 MG SL tablet Place 0.4 mg under the tongue as directed.        . quinapril (ACCUPRIL) 20 MG tablet Take 20 mg by mouth at bedtime.          Past Medical History  Diagnosis Date  . Aortic insufficiency     Mild to moderate  . Essential hypertension, benign   . Coronary atherosclerosis of native coronary artery     Nonobstructive at catheterization 2004    Social History Rodney Gordon reports that he quit smoking about 13  years ago. His smoking use included Cigarettes. He has a 25 pack-year smoking history. He has never used smokeless tobacco. Rodney Gordon reports that he does not drink alcohol.  Review of Systems No significant palpitations or syncope. No reported bleeding problems. Stable bowel habits. Otherwise negative except as outlined.  Physical Examination Filed Vitals:   10/15/11 1317  BP: 168/72  Pulse: 68   Normally nourished appearing male in no acute distress. HEENT: Conjunctiva and lids normal, oropharynx clear. Neck: Supple, no elevated JVP or carotid bruits, no thyromegaly. Lungs: Clear to auscultation, nonlabored breathing at rest. Cardiac: Regular rate and rhythm, no S3, 2/6 systolic murmur that radiates up on the right, soft diastolic murmur near apex., no pericardial rub. Abdomen: Nontender, fullness with apparent hepatomegaly palpated, bowel sounds present, no guarding or rebound. Extremities: No pitting edema, distal pulses 2+. Skin: Warm and dry. Musculoskeletal: No kyphosis. Neuropsychiatric: Alert and oriented x3, affect grossly appropriate.   ECG Sinus rhythm at 66 with LVH.   Problem List and Plan

## 2011-10-15 NOTE — Assessment & Plan Note (Signed)
Blood pressure is up today. He reports compliance with his medications. Fortunately he now has Medicaid and plans to establish with [name] for primary care. We discussed sodium restriction and diet. He will likely need further medication adjustments over time.

## 2011-10-15 NOTE — Assessment & Plan Note (Signed)
Less problematic of late. Observation recommended for now. Followup ECG shows sinus rhythm.

## 2011-10-17 ENCOUNTER — Other Ambulatory Visit: Payer: Self-pay

## 2011-10-17 ENCOUNTER — Other Ambulatory Visit (INDEPENDENT_AMBULATORY_CARE_PROVIDER_SITE_OTHER): Payer: Medicaid Other

## 2011-10-17 DIAGNOSIS — R002 Palpitations: Secondary | ICD-10-CM

## 2011-10-17 DIAGNOSIS — I359 Nonrheumatic aortic valve disorder, unspecified: Secondary | ICD-10-CM

## 2011-10-22 ENCOUNTER — Telehealth: Payer: Self-pay | Admitting: *Deleted

## 2011-10-22 NOTE — Telephone Encounter (Signed)
Attempted to reach pt regarding results. No answer, no voicemail.

## 2011-10-22 NOTE — Telephone Encounter (Signed)
Message copied by Marcille Buffy on Tue Oct 22, 2011  3:40 PM ------      Message from: MCDOWELL, Aloha Gell      Created: Mon Oct 21, 2011  9:11 AM       Reviewed report. LVEF is normal. Mild to moderate aortic regurgitation is stable. Also mild to moderate mitral regurgitation. Mildly elevated pulmonary pressures. Will continue observation and medical therapy for now.

## 2011-10-24 ENCOUNTER — Other Ambulatory Visit: Payer: Self-pay | Admitting: *Deleted

## 2011-10-24 MED ORDER — ATENOLOL-CHLORTHALIDONE 50-25 MG PO TABS: 1.0000 | ORAL_TABLET | Freq: Every day | ORAL | Status: DC

## 2011-10-24 MED ORDER — NITROGLYCERIN 0.4 MG SL SUBL: 0.4000 mg | SUBLINGUAL_TABLET | SUBLINGUAL | Status: DC

## 2011-10-31 ENCOUNTER — Encounter: Payer: Self-pay | Admitting: *Deleted

## 2011-10-31 NOTE — Telephone Encounter (Signed)
This encounter was created in error - please disregard.

## 2011-11-07 NOTE — Telephone Encounter (Signed)
Attempted to reach pt. Received message stating "the person you are trying to reach is not in the office right now.

## 2011-11-12 NOTE — Telephone Encounter (Signed)
Pt notified of results and verbalized understanding  

## 2011-12-27 ENCOUNTER — Other Ambulatory Visit: Payer: Self-pay | Admitting: *Deleted

## 2011-12-27 MED ORDER — AMLODIPINE BESYLATE 10 MG PO TABS: 10.0000 mg | ORAL_TABLET | Freq: Every day | ORAL | Status: DC

## 2011-12-27 MED ORDER — QUINAPRIL HCL 20 MG PO TABS: 20.0000 mg | ORAL_TABLET | Freq: Every day | ORAL | Status: DC

## 2012-04-16 ENCOUNTER — Ambulatory Visit: Payer: Medicaid Other | Admitting: Cardiology

## 2012-04-17 ENCOUNTER — Ambulatory Visit: Payer: Medicaid Other | Admitting: Cardiology

## 2012-05-28 ENCOUNTER — Ambulatory Visit (INDEPENDENT_AMBULATORY_CARE_PROVIDER_SITE_OTHER): Payer: Medicaid Other | Admitting: Cardiology

## 2012-05-28 ENCOUNTER — Encounter: Payer: Self-pay | Admitting: Cardiology

## 2012-05-28 VITALS — BP 198/82 | HR 58 | Ht 72.0 in | Wt 187.0 lb

## 2012-05-28 DIAGNOSIS — I10 Essential (primary) hypertension: Secondary | ICD-10-CM

## 2012-05-28 DIAGNOSIS — I351 Nonrheumatic aortic (valve) insufficiency: Secondary | ICD-10-CM

## 2012-05-28 DIAGNOSIS — I359 Nonrheumatic aortic valve disorder, unspecified: Secondary | ICD-10-CM

## 2012-05-28 DIAGNOSIS — I38 Endocarditis, valve unspecified: Secondary | ICD-10-CM

## 2012-05-28 MED ORDER — HYDRALAZINE HCL 25 MG PO TABS: 25.0000 mg | ORAL_TABLET | Freq: Three times a day (TID) | ORAL | Status: DC

## 2012-05-28 NOTE — Assessment & Plan Note (Signed)
Continue current regimen with the exception of stopping Accupril and initiating hydralazine 25 mg 3 times a day. Keep interval followup with [name].

## 2012-05-28 NOTE — Progress Notes (Signed)
Clinical Summary Rodney Gordon is a 56 y.o.male presenting for followup. He was seen back in March. He has since established with [name]. I reviewed his medications, he is on 2 different ACE inhibitors. Blood pressure remains elevated, although he states that he has been taking his medications.  Echocardiogram in March of this year revealed normal LV chamber size with EF 60-65%, mild to moderate aortic regurgitation which was stable, mild to moderate mitral regurgitation.  He reports no progressive shortness of breath, no anginal chest pain or palpitations.   No Known Allergies  Current Outpatient Prescriptions  Medication Sig Dispense Refill  . acetaminophen (TYLENOL) 500 MG tablet Take 500 mg by mouth as needed.        Marland Kitchen amLODipine (NORVASC) 10 MG tablet Take 1 tablet (10 mg total) by mouth daily.  30 tablet  6  . aspirin 81 MG tablet Take 81 mg by mouth daily.        Marland Kitchen atenolol-chlorthalidone (TENORETIC) 50-25 MG per tablet Take 1 tablet by mouth daily.  30 tablet  6  . cyclobenzaprine (FLEXERIL) 10 MG tablet Take 10 mg by mouth 2 (two) times daily.      . hydrALAZINE (APRESOLINE) 25 MG tablet Take 1 tablet (25 mg total) by mouth 3 (three) times daily.  90 tablet  6  . ibuprofen (ADVIL,MOTRIN) 200 MG tablet Take 200 mg by mouth as needed.        . meloxicam (MOBIC) 15 MG tablet Take 15 mg by mouth daily.      . nitroGLYCERIN (NITROSTAT) 0.4 MG SL tablet Place 1 tablet (0.4 mg total) under the tongue as directed.  25 tablet  2  . omeprazole (PRILOSEC) 10 MG capsule Take 20 mg by mouth daily.      . ramipril (ALTACE) 10 MG capsule Take 10 mg by mouth daily.      . [DISCONTINUED] hydrALAZINE (APRESOLINE) 25 MG tablet Take 25 mg by mouth 3 (three) times daily.      . [DISCONTINUED] isosorbide mononitrate (IMDUR) 30 MG 24 hr tablet Take 30 mg by mouth daily.          Past Medical History  Diagnosis Date  . Aortic insufficiency     Mild to moderate  . Essential hypertension, benign     . Coronary atherosclerosis of native coronary artery     Nonobstructive at catheterization 2004    Social History Mr. Kestler reports that he quit smoking about 13 years ago. His smoking use included Cigarettes. He has a 25 pack-year smoking history. He has never used smokeless tobacco. Mr. Soucie reports that he does not drink alcohol.  Review of Systems Intermittent left posterior musculoskeletal thoracic discomfort. No bleeding problems. No interval hospitalizations. Otherwise negative.  Physical Examination Filed Vitals:   05/28/12 1448  BP: 198/82  Pulse:    Filed Weights   05/28/12 1439  Weight: 187 lb (84.823 kg)    HEENT: Conjunctiva and lids normal, oropharynx clear.  Neck: Supple, no elevated JVP or carotid bruits, no thyromegaly.  Lungs: Clear to auscultation, nonlabored breathing at rest.  Cardiac: Regular rate and rhythm, no S3, 2/6 systolic murmur that radiates up on the right, soft diastolic murmur near apex., no pericardial rub.  Abdomen: Nontender, fullness as before, bowel sounds present, no guarding or rebound.  Extremities: No pitting edema, distal pulses 2+.  Skin: Warm and dry.  Musculoskeletal: No kyphosis.  Neuropsychiatric: Alert and oriented x3, affect grossly appropriate.  Problem List and Plan  Essential hypertension, benign Continue current regimen with the exception of stopping Accupril and initiating hydralazine 25 mg 3 times a day. Keep interval followup with [name].  VALVULAR HEART DISEASE Mild to moderate aortic and mitral regurgitation, preserved LV function with normal chamber size. Followup echocardiogram for the next visit in 6 months.    Satira Sark, M.D., F.A.C.C.

## 2012-05-28 NOTE — Assessment & Plan Note (Signed)
Mild to moderate aortic and mitral regurgitation, preserved LV function with normal chamber size. Followup echocardiogram for the next visit in 6 months.

## 2012-05-28 NOTE — Patient Instructions (Addendum)
Your physician recommends that you schedule a follow-up appointment in: 6 months. You will receive a reminder letter in the mail in about 4 months reminding you to call and schedule your appointment. If you don't receive this letter, please contact our office.  Your physician has recommended you make the following change in your medication: Stop quinapril (accupril) 20 mg. Start hydralazine 25 mg three times daily. Your new prescription has been sent to your pharmacy. All other medications will remain the same. Your physician has requested that you have an echocardiogram in 6 months just before your next appointment. Echocardiography is a painless test that uses sound waves to create images of your heart. It provides your doctor with information about the size and shape of your heart and how well your heart's chambers and valves are working. This procedure takes approximately one hour. There are no restrictions for this procedure.

## 2012-10-20 ENCOUNTER — Other Ambulatory Visit: Payer: Self-pay | Admitting: Cardiology

## 2012-11-19 ENCOUNTER — Other Ambulatory Visit (INDEPENDENT_AMBULATORY_CARE_PROVIDER_SITE_OTHER): Payer: Medicaid Other

## 2012-11-19 ENCOUNTER — Other Ambulatory Visit: Payer: Self-pay

## 2012-11-19 DIAGNOSIS — I059 Rheumatic mitral valve disease, unspecified: Secondary | ICD-10-CM

## 2012-11-19 DIAGNOSIS — I351 Nonrheumatic aortic (valve) insufficiency: Secondary | ICD-10-CM

## 2012-11-19 DIAGNOSIS — I359 Nonrheumatic aortic valve disorder, unspecified: Secondary | ICD-10-CM

## 2012-11-26 ENCOUNTER — Encounter: Payer: Medicaid Other | Admitting: Cardiology

## 2012-11-26 ENCOUNTER — Encounter: Payer: Self-pay | Admitting: Cardiology

## 2012-11-26 NOTE — Progress Notes (Signed)
No show  This encounter was created in error - please disregard.

## 2012-11-27 ENCOUNTER — Telehealth: Payer: Self-pay | Admitting: *Deleted

## 2012-11-27 NOTE — Telephone Encounter (Signed)
Message copied by Merlene Laughter on Fri Nov 27, 2012 11:06 AM ------      Message from: MCDOWELL, Aloha Gell      Created: Thu Nov 19, 2012  4:08 PM       Reviewed. Valvular abnormalities are stable, mild to moderate AR, and only mild MR. Incidentally noted is ascites in the abdomen. Please forward a copy of this echocardiogram to [name]. Please contact his office and explain that it may be a good idea to proceed with an abdominal ultrasound to investigate this further. ------

## 2012-11-27 NOTE — Telephone Encounter (Signed)
Patient informed and copy with message sent to Hasanj's office. Nurse informed patient to contact his office to discuss these findings.

## 2012-11-27 NOTE — Telephone Encounter (Addendum)
Spoke with Deanna, and Amy at [place] office and confirmed they received results and also make sure their office would be following up on these findings. They did received results per Amy and will f/u with patient.

## 2012-12-24 ENCOUNTER — Other Ambulatory Visit: Payer: Self-pay | Admitting: *Deleted

## 2012-12-24 MED ORDER — ATENOLOL-CHLORTHALIDONE 50-25 MG PO TABS: 1.0000 | ORAL_TABLET | Freq: Every day | ORAL | Status: DC

## 2013-01-18 ENCOUNTER — Ambulatory Visit (INDEPENDENT_AMBULATORY_CARE_PROVIDER_SITE_OTHER): Payer: Medicaid Other | Admitting: Cardiology

## 2013-01-18 ENCOUNTER — Encounter: Payer: Self-pay | Admitting: Cardiology

## 2013-01-18 VITALS — BP 177/77 | HR 51 | Ht 73.0 in | Wt 185.0 lb

## 2013-01-18 DIAGNOSIS — I359 Nonrheumatic aortic valve disorder, unspecified: Secondary | ICD-10-CM

## 2013-01-18 DIAGNOSIS — I2510 Atherosclerotic heart disease of native coronary artery without angina pectoris: Secondary | ICD-10-CM

## 2013-01-18 DIAGNOSIS — I10 Essential (primary) hypertension: Secondary | ICD-10-CM

## 2013-01-18 NOTE — Patient Instructions (Addendum)

## 2013-01-18 NOTE — Assessment & Plan Note (Signed)
Blood pressure elevated today. Patient states that he has been compliant with his medications. Question whether his bladder outlet obstruction may also be contributing by limiting urine output. This is being managed by [name]. If there is no mechanical intervention required regarding the patient's bladder outlet obstruction, may actually consider putting him back on hydralazine without Imdur to see if he tolerates this.

## 2013-01-18 NOTE — Progress Notes (Signed)
Clinical Summary Mr. Chatmon is a 57 y.o.male last seen in November of last year. He reports no chest pain to suggest angina symptoms, no progressive shortness of breath. He reports compliance with his medications. States that he was not able to take Imdur or hydralazine, reported headaches.  Echocardiogram from May of this year showed moderate LVH with LVEF 0000000, grade 1 diastolic dysfunction, mild to moderate aortic regurgitation, mild mitral regurgitation, moderately dilated left atrium, RVSP 47 mmHg, possible ascitic fluid noted (results forwarded to [name]). It looks like he did have a followup abdominal ultrasound in May that demonstrated severe bilateral hydronephrosis, right greater than left with markedly distended urinary bladder consistent with bladder outlet obstruction. No description of ascites.  ECG today shows sinus bradycardia.  No Known Allergies  Current Outpatient Prescriptions  Medication Sig Dispense Refill  . acetaminophen (TYLENOL) 500 MG tablet Take 500 mg by mouth as needed.        Marland Kitchen amLODipine (NORVASC) 10 MG tablet TAKE ONE TABLET BY MOUTH EVERY DAY  30 tablet  3  . aspirin 81 MG tablet Take 81 mg by mouth daily.        Marland Kitchen atenolol-chlorthalidone (TENORETIC) 50-25 MG per tablet Take 1 tablet by mouth daily.  30 tablet  1  . cyclobenzaprine (FLEXERIL) 10 MG tablet Take 10 mg by mouth 2 (two) times daily.      . diclofenac (VOLTAREN) 50 MG EC tablet Take 50 mg by mouth 2 (two) times daily.      Marland Kitchen ibuprofen (ADVIL,MOTRIN) 200 MG tablet Take 200 mg by mouth as needed.        . nitroGLYCERIN (NITROSTAT) 0.4 MG SL tablet Place 1 tablet (0.4 mg total) under the tongue as directed.  25 tablet  2  . omeprazole (PRILOSEC) 10 MG capsule Take 20 mg by mouth daily.      . ramipril (ALTACE) 10 MG capsule Take 10 mg by mouth daily.      . tamsulosin (FLOMAX) 0.4 MG CAPS Take 0.4 mg by mouth daily after supper.      . [DISCONTINUED] isosorbide mononitrate (IMDUR) 30 MG 24 hr  tablet Take 30 mg by mouth daily.         No current facility-administered medications for this visit.    Past Medical History  Diagnosis Date  . Aortic insufficiency     Mild to moderate  . Essential hypertension, benign   . Coronary atherosclerosis of native coronary artery     Nonobstructive at catheterization 2004    Social History Mr. Moede reports that he quit smoking about 14 years ago. His smoking use included Cigarettes. He has a 25 pack-year smoking history. He has never used smokeless tobacco. Mr. Cusano reports that he does not drink alcohol.  Review of Systems Some prostatic symptoms, now on Flomax. Otherwise as outlined.  Physical Examination Filed Vitals:   01/18/13 1000  BP: 177/77  Pulse: 51   Filed Weights   01/18/13 1000  Weight: 185 lb (83.915 kg)   Comfortable at rest. HEENT: Conjunctiva and lids normal, oropharynx clear.  Neck: Supple, no elevated JVP or carotid bruits, no thyromegaly.  Lungs: Clear to auscultation, nonlabored breathing at rest.  Cardiac: Regular rate and rhythm, no S3, 2/6 systolic murmur that radiates up on the right, soft diastolic murmur near apex., no pericardial rub.  Abdomen: Nontender, fullness as before, bowel sounds present, no guarding or rebound.  Extremities: No pitting edema, distal pulses 2+.  Skin: Warm and  dry.  Musculoskeletal: No kyphosis.  Neuropsychiatric: Alert and oriented x3, affect grossly appropriate.   Problem List and Plan   Aortic valve disorders Mild to moderate aortic regurgitation, stable.  Essential hypertension, benign Blood pressure elevated today. Patient states that he has been compliant with his medications. Question whether his bladder outlet obstruction may also be contributing by limiting urine output. This is being managed by [name]. If there is no mechanical intervention required regarding the patient's bladder outlet obstruction, may actually consider putting him back on hydralazine  without Imdur to see if he tolerates this.    Satira Sark, M.D., F.A.C.C.

## 2013-01-18 NOTE — Assessment & Plan Note (Signed)
Mild to moderate aortic regurgitation, stable.

## 2013-02-24 ENCOUNTER — Other Ambulatory Visit: Payer: Self-pay | Admitting: Cardiology

## 2013-02-24 MED ORDER — AMLODIPINE BESYLATE 10 MG PO TABS: 10.0000 mg | ORAL_TABLET | Freq: Every day | ORAL | Status: DC

## 2013-03-10 ENCOUNTER — Other Ambulatory Visit: Payer: Self-pay | Admitting: *Deleted

## 2013-03-10 MED ORDER — ATENOLOL-CHLORTHALIDONE 50-25 MG PO TABS: 1.0000 | ORAL_TABLET | Freq: Every day | ORAL | Status: DC

## 2013-04-05 ENCOUNTER — Other Ambulatory Visit: Payer: Self-pay | Admitting: Cardiology

## 2013-04-05 MED ORDER — NITROGLYCERIN 0.4 MG SL SUBL: 0.4000 mg | SUBLINGUAL_TABLET | SUBLINGUAL | Status: DC

## 2013-05-26 ENCOUNTER — Telehealth: Payer: Self-pay | Admitting: Cardiology

## 2013-05-26 NOTE — Telephone Encounter (Signed)
Information forwarded to MD.

## 2013-07-20 ENCOUNTER — Other Ambulatory Visit: Payer: Self-pay | Admitting: *Deleted

## 2013-07-20 DIAGNOSIS — Z01810 Encounter for preprocedural cardiovascular examination: Secondary | ICD-10-CM

## 2013-07-20 DIAGNOSIS — N185 Chronic kidney disease, stage 5: Secondary | ICD-10-CM

## 2013-07-26 ENCOUNTER — Encounter: Payer: Self-pay | Admitting: Cardiology

## 2013-07-26 ENCOUNTER — Ambulatory Visit (INDEPENDENT_AMBULATORY_CARE_PROVIDER_SITE_OTHER): Payer: Medicaid Other | Admitting: Cardiology

## 2013-07-26 VITALS — BP 186/83 | HR 61 | Ht 73.0 in | Wt 185.0 lb

## 2013-07-26 DIAGNOSIS — I351 Nonrheumatic aortic (valve) insufficiency: Secondary | ICD-10-CM

## 2013-07-26 DIAGNOSIS — Z992 Dependence on renal dialysis: Secondary | ICD-10-CM | POA: Insufficient documentation

## 2013-07-26 DIAGNOSIS — I10 Essential (primary) hypertension: Secondary | ICD-10-CM

## 2013-07-26 DIAGNOSIS — N186 End stage renal disease: Secondary | ICD-10-CM

## 2013-07-26 DIAGNOSIS — I359 Nonrheumatic aortic valve disorder, unspecified: Secondary | ICD-10-CM

## 2013-07-26 NOTE — Assessment & Plan Note (Signed)
Now on Norvasc and labetalol. May need further medication adjustments pending stabilization of his renal status.

## 2013-07-26 NOTE — Patient Instructions (Signed)
Your physician recommends that you schedule a follow-up appointment in: 6 months. You will receive a reminder letter in the mail in about 4 months reminding you to call and schedule your appointment. If you don't receive this letter, please contact our office. Your physician recommends that you continue on your current medications as directed. Please refer to the Current Medication list given to you today. Your physician has requested that you have an echocardiogram in 6 months just before your next visit. Echocardiography is a painless test that uses sound waves to create images of your heart. It provides your doctor with information about the size and shape of your heart and how well your heart's chambers and valves are working. This procedure takes approximately one hour. There are no restrictions for this procedure. You will be contacted when this is due.

## 2013-07-26 NOTE — Progress Notes (Signed)
Clinical Summary Rodney Gordon is a 58 y.o.male last seen in June 2014. He reports no major change in terms of his cardiac status, no progressive shortness of breath, no chest pain. In the interim I note that he has developed end-stage renal disease. Lab work done just recently at [facility] showed BUN 41, creatinine 7.0. He has been followed by [name], and has consultations coming up with [name] and Dr. Trula Slade for vascular access for hemodialysis.  Echocardiogram from May 2014 showed moderate LVH with LVEF 0000000, grade 1 diastolic dysfunction, mild to moderate aortic regurgitation, mild mitral regurgitation, moderately dilated left atrium, RVSP 47 mmHg.  He reports compliance with his medications, states his systolic blood pressure has been running 130-150.  No Known Allergies  Current Outpatient Prescriptions  Medication Sig Dispense Refill  . acetaminophen (TYLENOL) 500 MG tablet Take 500 mg by mouth as needed.        Marland Kitchen amLODipine (NORVASC) 10 MG tablet Take 1 tablet (10 mg total) by mouth daily.  30 tablet  6  . aspirin 81 MG tablet Take 81 mg by mouth daily.        Marland Kitchen dutasteride (AVODART) 0.5 MG capsule Take 0.5 mg by mouth daily.      Marland Kitchen ibuprofen (ADVIL,MOTRIN) 200 MG tablet Take 200 mg by mouth as needed.        . labetalol (NORMODYNE) 300 MG tablet Take 300 mg by mouth daily.      . nitroGLYCERIN (NITROSTAT) 0.4 MG SL tablet Place 1 tablet (0.4 mg total) under the tongue as directed.  25 tablet  2  . tamsulosin (FLOMAX) 0.4 MG CAPS Take 0.4 mg by mouth daily after supper.      . [DISCONTINUED] isosorbide mononitrate (IMDUR) 30 MG 24 hr tablet Take 30 mg by mouth daily.         No current facility-administered medications for this visit.    Past Medical History  Diagnosis Date  . Aortic insufficiency     Mild to moderate  . Essential hypertension, benign   . Coronary atherosclerosis of native coronary artery     Nonobstructive at catheterization 2004    Social  History Rodney Gordon reports that he quit smoking about 15 years ago. His smoking use included Cigarettes. He has a 25 pack-year smoking history. He has never used smokeless tobacco. Rodney Gordon reports that he does not drink alcohol.  Review of Systems No palpitations, no syncope. No fevers or chills. Otherwise negative.  Physical Examination Filed Vitals:   07/26/13 1452  BP: 186/83  Pulse: 61   Filed Weights   07/26/13 1452  Weight: 185 lb (83.915 kg)    Comfortable at rest.  HEENT: Conjunctiva and lids normal, oropharynx clear.  Neck: Supple, no elevated JVP or carotid bruits, no thyromegaly.  Lungs: Clear to auscultation, nonlabored breathing at rest.  Cardiac: Regular rate and rhythm, no S3, 2/6 systolic murmur that radiates up on the right, soft diastolic murmur near apex., no pericardial rub.  Abdomen: Nontender, fullness as before, bowel sounds present, no guarding or rebound.  Extremities: No pitting edema, distal pulses 2+.  Skin: Warm and dry.  Musculoskeletal: No kyphosis.  Neuropsychiatric: Alert and oriented x3, affect grossly appropriate.   Problem List and Plan   Aortic regurgitation Last echocardiogram demonstrated mild to moderate aortic regurgitation with grade 1 diastolic dysfunction and preserved LVEF. Plan to continue observation, followup study this coming year.  ESRD (end stage renal disease) Interval diagnosis noted, creatinine  7.0. He will be seeing [name] for nephrology consultation soon followed by Dr. Trula Slade for vascular access considerations. He is off diuretics and ACE inhibitor at this point.  Essential hypertension, benign Now on Norvasc and labetalol. May need further medication adjustments pending stabilization of his renal status.    Satira Sark, M.D., F.A.C.C.

## 2013-07-26 NOTE — Assessment & Plan Note (Signed)
Last echocardiogram demonstrated mild to moderate aortic regurgitation with grade 1 diastolic dysfunction and preserved LVEF. Plan to continue observation, followup study this coming year.

## 2013-07-26 NOTE — Assessment & Plan Note (Signed)
Interval diagnosis noted, creatinine 7.0. He will be seeing [name] for nephrology consultation soon followed by Dr. Trula Slade for vascular access considerations. He is off diuretics and ACE inhibitor at this point.

## 2013-08-13 ENCOUNTER — Encounter: Payer: Self-pay | Admitting: Surgery

## 2013-08-16 ENCOUNTER — Inpatient Hospital Stay (HOSPITAL_COMMUNITY): Admission: RE | Admit: 2013-08-16 | Payer: Medicaid Other | Source: Ambulatory Visit

## 2013-08-16 ENCOUNTER — Ambulatory Visit: Payer: Medicaid Other | Admitting: Surgery

## 2014-01-10 ENCOUNTER — Other Ambulatory Visit: Payer: Self-pay | Admitting: *Deleted

## 2014-01-10 DIAGNOSIS — I38 Endocarditis, valve unspecified: Secondary | ICD-10-CM

## 2014-01-10 DIAGNOSIS — I351 Nonrheumatic aortic (valve) insufficiency: Secondary | ICD-10-CM

## 2014-01-26 ENCOUNTER — Other Ambulatory Visit: Payer: Medicaid Other

## 2014-02-09 ENCOUNTER — Ambulatory Visit: Payer: Medicaid Other | Admitting: Cardiology

## 2014-02-09 ENCOUNTER — Other Ambulatory Visit: Payer: Medicaid Other

## 2014-03-01 ENCOUNTER — Encounter: Payer: Medicaid Other | Admitting: Cardiology

## 2014-03-01 ENCOUNTER — Encounter: Payer: Self-pay | Admitting: Cardiology

## 2014-03-01 NOTE — Progress Notes (Signed)
No show  This encounter was created in error - please disregard.

## 2014-03-16 ENCOUNTER — Other Ambulatory Visit (INDEPENDENT_AMBULATORY_CARE_PROVIDER_SITE_OTHER): Payer: Medicaid Other

## 2014-03-16 ENCOUNTER — Other Ambulatory Visit: Payer: Self-pay

## 2014-03-16 DIAGNOSIS — I38 Endocarditis, valve unspecified: Secondary | ICD-10-CM

## 2014-03-16 DIAGNOSIS — I351 Nonrheumatic aortic (valve) insufficiency: Secondary | ICD-10-CM

## 2014-03-16 DIAGNOSIS — I059 Rheumatic mitral valve disease, unspecified: Secondary | ICD-10-CM

## 2014-03-16 DIAGNOSIS — I359 Nonrheumatic aortic valve disorder, unspecified: Secondary | ICD-10-CM

## 2014-03-25 ENCOUNTER — Ambulatory Visit (INDEPENDENT_AMBULATORY_CARE_PROVIDER_SITE_OTHER): Payer: Medicaid Other | Admitting: Cardiology

## 2014-03-25 ENCOUNTER — Encounter: Payer: Self-pay | Admitting: Cardiology

## 2014-03-25 VITALS — BP 184/82 | HR 53 | Ht 73.0 in | Wt 181.0 lb

## 2014-03-25 DIAGNOSIS — I10 Essential (primary) hypertension: Secondary | ICD-10-CM | POA: Diagnosis not present

## 2014-03-25 DIAGNOSIS — N186 End stage renal disease: Secondary | ICD-10-CM | POA: Diagnosis not present

## 2014-03-25 DIAGNOSIS — I351 Nonrheumatic aortic (valve) insufficiency: Secondary | ICD-10-CM

## 2014-03-25 DIAGNOSIS — I359 Nonrheumatic aortic valve disorder, unspecified: Secondary | ICD-10-CM

## 2014-03-25 NOTE — Assessment & Plan Note (Addendum)
Blood pressure elevated today. He continues on Norvasc and labetalol. Heart rate limits further titration of beta blocker. May need to consider addition of hydralazine. Keep followup with [name]. I also plan to arrange an abdominal ultrasound since he had some posterior thoracic and epigastric discomfort, make sure that his ascending aorta is stable.

## 2014-03-25 NOTE — Patient Instructions (Signed)
Continue all current medications. Your physician wants you to follow up in: 6 months.  You will receive a reminder letter in the mail one-two months in advance.  If you don't receive a letter, please call our office to schedule the follow up appointment   

## 2014-03-25 NOTE — Assessment & Plan Note (Signed)
Mild to moderate, stable and asymptomatic.

## 2014-03-25 NOTE — Assessment & Plan Note (Signed)
Patient has had prostate surgery with improved urinary stream, however creatinine recently 7.0. He is following with [name].

## 2014-03-25 NOTE — Progress Notes (Signed)
Clinical Summary [name] is a 58 y.o.male last seen in January. Since that time he states that he has had prostate surgery, has much better urine stream. Renal function remains abnormal however, recent lab work on September 2 showed BUN 47, creatinine 7.0. He continues to follow with Dr. Lowanda Gordon.  Echocardiogram from August 26 showed moderate LVH with LVEF 123456, grade 1 diastolic dysfunction, stable mild to moderate aortic regurgitation, aortic root 32 mm, mild mitral regurgitation, mild left atrial enlargement, mildly dilated RV, mild to moderate tricuspid regurgitation with PASD 55 mm mercury. I reviewed these results with him today.   No Known Allergies  Current Outpatient Prescriptions  Medication Sig Dispense Refill  . acetaminophen (TYLENOL) 500 MG tablet Take 500 mg by mouth as needed.        Marland Kitchen amLODipine (NORVASC) 10 MG tablet Take 1 tablet (10 mg total) by mouth daily.  30 tablet  6  . aspirin 81 MG tablet Take 81 mg by mouth daily.        . ciprofloxacin (CIPRO) 250 MG tablet Take 250 mg by mouth daily.      Marland Kitchen doxazosin (CARDURA) 8 MG tablet Take 8 mg by mouth daily.      . furosemide (LASIX) 40 MG tablet Take 40 mg by mouth daily.      Marland Kitchen labetalol (NORMODYNE) 300 MG tablet Take 300 mg by mouth 2 (two) times daily.       . nitroGLYCERIN (NITROSTAT) 0.4 MG SL tablet Place 1 tablet (0.4 mg total) under the tongue as directed.  25 tablet  2  . silodosin (RAPAFLO) 8 MG CAPS capsule Take 8 mg by mouth daily.      Marland Kitchen sulfamethoxazole-trimethoprim (BACTRIM DS,SEPTRA DS) 800-160 MG per tablet Take 1 tablet by mouth 2 (two) times daily.      . tamsulosin (FLOMAX) 0.4 MG CAPS Take 0.4 mg by mouth daily after supper.      . [DISCONTINUED] isosorbide mononitrate (IMDUR) 30 MG 24 hr tablet Take 30 mg by mouth daily.         No current facility-administered medications for this visit.    Past Medical History  Diagnosis Date  . Aortic insufficiency     Mild to moderate  . Essential  hypertension, benign   . Coronary atherosclerosis of native coronary artery     Nonobstructive at catheterization 2004  . ESRD (end stage renal disease)     Social History [name] reports that he quit smoking about 15 years ago. His smoking use included Cigarettes. He has a 25 pack-year smoking history. He has never used smokeless tobacco. [name] reports that he does not drink alcohol.  Review of Systems No palpitations or syncope. Complains of midthoracic posterior pain that goes through to his epigastrium. Reports soreness in his thoracic level. Other systems reviewed and negative.  Physical Examination Filed Vitals:   03/25/14 1444  BP: 184/82  Pulse: 53   Filed Weights   03/25/14 1444  Weight: 181 lb (82.101 kg)    Comfortable at rest.  HEENT: Conjunctiva and lids normal, oropharynx clear.  Neck: Supple, no elevated JVP or carotid bruits, no thyromegaly.  Lungs: Clear to auscultation, nonlabored breathing at rest.  Cardiac: Regular rate and rhythm, no S3, 2/6 systolic murmur that radiates up on the right, soft diastolic murmur near apex., no pericardial rub.  Abdomen: Nontender, fullness as before, bowel sounds present, no guarding or rebound.  Extremities: No pitting edema, distal pulses 2+.  Skin: Warm and dry.  Musculoskeletal: No kyphosis.  Neuropsychiatric: Alert and oriented x3, affect grossly appropriate.   Problem List and Plan   Aortic regurgitation Mild to moderate, stable and asymptomatic.  ESRD (end stage renal disease) Patient has had prostate surgery with improved urinary stream, however creatinine recently 7.0. He is following with Dr. Lowanda Gordon.  Essential hypertension, benign Blood pressure elevated today. He continues on Norvasc and labetalol. Heart rate limits further titration of beta blocker. May need to consider addition of hydralazine. Keep followup with Dr. Lowanda Gordon. I also plan to arrange an abdominal ultrasound since he had some posterior  thoracic and epigastric discomfort, make sure that his ascending aorta is stable.    Satira Sark, M.D., F.A.C.C.

## 2014-03-29 ENCOUNTER — Telehealth: Payer: Self-pay | Admitting: *Deleted

## 2014-03-29 NOTE — Telephone Encounter (Signed)
Per Dr. Domenic Polite - please have patient schedule abdominal ultrasound since he had some posterior thoracic and epigastric discomfort, make sure that his ascending aorta is stable.  Attempted to notify patient - left message.

## 2014-04-05 NOTE — Telephone Encounter (Signed)
Left message to return call 

## 2014-04-08 NOTE — Telephone Encounter (Signed)
Left message to return call 

## 2014-04-18 NOTE — Telephone Encounter (Signed)
Left message to return call 

## 2014-04-25 ENCOUNTER — Encounter: Payer: Self-pay | Admitting: *Deleted

## 2014-04-25 NOTE — Telephone Encounter (Signed)
No return call from patient to present date.  Will mail letter.

## 2014-09-23 ENCOUNTER — Encounter: Payer: Self-pay | Admitting: Cardiology

## 2014-09-23 ENCOUNTER — Ambulatory Visit (INDEPENDENT_AMBULATORY_CARE_PROVIDER_SITE_OTHER): Payer: Medicaid Other | Admitting: Cardiology

## 2014-09-23 VITALS — BP 158/70 | HR 56 | Ht 73.0 in | Wt 188.0 lb

## 2014-09-23 DIAGNOSIS — I351 Nonrheumatic aortic (valve) insufficiency: Secondary | ICD-10-CM

## 2014-09-23 DIAGNOSIS — I2510 Atherosclerotic heart disease of native coronary artery without angina pectoris: Secondary | ICD-10-CM

## 2014-09-23 NOTE — Patient Instructions (Signed)

## 2014-09-23 NOTE — Progress Notes (Signed)
Cardiology Office Note  Date: 09/23/2014   ID: Rodney Gordon, DOB 1956-06-01, MRN [id number]  PCP: Neale Burly, MD  Primary Cardiologist: Rozann Lesches, MD   Chief Complaint  Patient presents with  . Coronary Artery Disease  . Aortic regurgitation    History of Present Illness: Rodney Gordon is a 59 y.o. male last seen in September 2015. He presents for a routine follow-up visit. Reports that he has been doing fairly well. No abdominal pain or back pain, reports good urine flow after his previous prostate surgery. Unfortunately, it does not sound like his renal function has improved, he recalls his recent creatinine around 8. He still follows with [name].  From a cardiac perspective he reports no angina symptoms, no increasing shortness of breath. His most recent echocardiogram is documented below.   Past Medical History  Diagnosis Date  . Aortic insufficiency     Mild to moderate  . Essential hypertension, benign   . Coronary atherosclerosis of native coronary artery     Nonobstructive at catheterization 2004  . ESRD (end stage renal disease)     Current Outpatient Prescriptions  Medication Sig Dispense Refill  . acetaminophen (TYLENOL) 500 MG tablet Take 500 mg by mouth as needed.      Marland Kitchen amLODipine (NORVASC) 10 MG tablet Take 1 tablet (10 mg total) by mouth daily. 30 tablet 6  . aspirin 81 MG tablet Take 81 mg by mouth daily.      Marland Kitchen doxazosin (CARDURA) 8 MG tablet Take 8 mg by mouth daily.    . furosemide (LASIX) 40 MG tablet Take 40 mg by mouth daily.    . hydrALAZINE (APRESOLINE) 50 MG tablet Take 50 mg by mouth 2 (two) times daily.    Marland Kitchen labetalol (NORMODYNE) 300 MG tablet Take 300 mg by mouth 2 (two) times daily.     . nitroGLYCERIN (NITROSTAT) 0.4 MG SL tablet Place 1 tablet (0.4 mg total) under the tongue as directed. 25 tablet 2  . silodosin (RAPAFLO) 8 MG CAPS capsule Take 8 mg by mouth daily.    . sodium bicarbonate 650 MG tablet Take 650 mg by mouth  4 (four) times daily.    . tamsulosin (FLOMAX) 0.4 MG CAPS Take 0.4 mg by mouth daily after supper.    . [DISCONTINUED] isosorbide mononitrate (IMDUR) 30 MG 24 hr tablet Take 30 mg by mouth daily.       No current facility-administered medications for this visit.    Allergies:  Review of patient's allergies indicates no known allergies.   Social History: The patient  reports that he quit smoking about 16 years ago. His smoking use included Cigarettes. He has a 25 pack-year smoking history. He has never used smokeless tobacco. He reports that he does not drink alcohol or use illicit drugs.    ROS:  Please see the history of present illness. Otherwise, complete review of systems is positive for none.  All other systems are reviewed and negative.    Physical Exam: VS:  BP 158/70 mmHg  Pulse 56  Ht 6\' 1"  (1.854 m)  Wt 188 lb (85.276 kg)  BMI 24.81 kg/m2  SpO2 98%, BMI Body mass index is 24.81 kg/(m^2).  Wt Readings from Last 3 Encounters:  09/23/14 188 lb (85.276 kg)  03/25/14 181 lb (82.101 kg)  07/26/13 185 lb (83.915 kg)     Comfortable at rest.  HEENT: Conjunctiva and lids normal, oropharynx clear.  Neck: Supple, no elevated JVP or  carotid bruits, no thyromegaly.  Lungs: Clear to auscultation, nonlabored breathing at rest.  Cardiac: Regular rate and rhythm, no S3, 2/6 systolic murmur that radiates up on the right, soft diastolic murmur near apex., no pericardial rub.  Abdomen: Nontender, fullness as before, bowel sounds present, no guarding or rebound.  Extremities: No pitting edema, distal pulses 2+.  Skin: Warm and dry.  Musculoskeletal: No kyphosis.  Neuropsychiatric: Alert and oriented x3, affect grossly appropriate.   ECG: ECG is not ordered today.   Other Studies Reviewed Today:  Echocardiogram from August 2015 showed moderate LVH with LVEF 123456, grade 1 diastolic dysfunction, stable mild to moderate aortic regurgitation, aortic root 32 mm, mild mitral  regurgitation, mild left atrial enlargement, mildly dilated RV, mild to moderate tricuspid regurgitation with PASD 55 mm mercury.   Assessment and Plan:  1. History of nonobstructive CAD, no active angina symptoms. Continue medical therapy and observation.  2. Aortic regurgitation, mild to moderate by recent echocardiogram in August 2015. Asymptomatic.  Current medicines are reviewed at length with the patient today.  The patient does not have concerns regarding medicines.  Disposition: FU with me in 6 months.   Signed, Satira Sark, MD, [facility] 09/23/2014 2:19 PM    [place] [facility] [place], [place], [place] [address] Phone: [phone]; Fax: [phone]

## 2015-04-13 ENCOUNTER — Telehealth: Payer: Self-pay | Admitting: Cardiology

## 2015-04-13 NOTE — Telephone Encounter (Signed)
Tried calling patient to schedule past due Echo.  His phone # does not accept incoming phone calls. Will mail patient a letter.

## 2015-04-20 ENCOUNTER — Other Ambulatory Visit: Payer: Self-pay | Admitting: Cardiology

## 2015-04-20 DIAGNOSIS — I350 Nonrheumatic aortic (valve) stenosis: Secondary | ICD-10-CM

## 2015-04-26 ENCOUNTER — Other Ambulatory Visit: Payer: Self-pay

## 2015-04-26 ENCOUNTER — Ambulatory Visit (INDEPENDENT_AMBULATORY_CARE_PROVIDER_SITE_OTHER): Payer: Medicaid Other

## 2015-04-26 DIAGNOSIS — I350 Nonrheumatic aortic (valve) stenosis: Secondary | ICD-10-CM | POA: Diagnosis not present

## 2015-04-27 ENCOUNTER — Encounter: Payer: Self-pay | Admitting: *Deleted

## 2015-04-27 ENCOUNTER — Telehealth: Payer: Self-pay | Admitting: *Deleted

## 2015-04-27 NOTE — Telephone Encounter (Signed)
Contacted Dr. Rhett Bannister office to request an appointment to follow up this patient's elevated blood pressure to adjust medications. Per Deanna,patient was recently seen by Hasanj on 04/10/15. Deanna will contact this patient for an appointment.

## 2015-04-27 NOTE — Telephone Encounter (Signed)
Patient informed and also informed nurse that he was no longer on hydralazine since June 2016. Patient said that Dr. Telford Nab stopped hydralazine because it was causing patient to have headaches. Nurse attempted to review medications on the phone with patient but patient informed nurse that he was on a minute phone and he was about to run out of minutes. Nurse advised patient that the pharmacy would be contacted to update his medication profile.

## 2015-04-27 NOTE — Telephone Encounter (Signed)
Patient informed and verbalized understanding of plan. Patient informed nurse that Hasanj's office had already called him and also said that he restarted his cardura 8 mg that he was supposed to be on but had stopped taking it for a while.

## 2015-04-27 NOTE — Telephone Encounter (Signed)
Thank you for tracking all this information down [place]. Since [name] is adjusting the patient's medications and actually stopped his hydralazine, I would recommend that he be seen in the near future by [name] to continue to address blood pressure control.  Maybe we can help get him an appointment soon with [name].

## 2015-04-27 NOTE — Telephone Encounter (Signed)
Spoke with staff at [facility] to review medications filled by patient in the past 3 -4 months. Medications reconciled according to pharmacy. Please see medication list for updated medications and further recommendations r/e medication adjustment.

## 2015-04-27 NOTE — Telephone Encounter (Signed)
-----   Message from Rodney Sark, MD sent at 04/26/2015  4:07 PM EDT ----- Thanks Roderic Palau. I saw [name] about 6 months ago. He follows with [name] for his blood pressure and also has end-stage renal disease complicating management of his hypertension. I am not certain about his current medications, based on the list from 6 months ago I would recommend increasing hydralazine to 50 mg 3 times a day for now. I will forward this to [place].  ----- Message -----    From: Rodney Lenis, MD    Sent: 04/26/2015   3:52 PM      To: Rodney Sark, MD  Sam, Patient of yours with echo today. Asymptomatic SBP at 200, took his bp meds about 1 hour prior to coming in. States he runs this high sometimes, typically 160s-170s at home he reports. Told him I would notify you in case you want to alter any meds.   Zandra Abts MD

## 2015-04-27 NOTE — Telephone Encounter (Signed)
This encounter was created in error - please disregard.

## 2015-04-27 NOTE — Telephone Encounter (Signed)
-----   Message from Satira Sark, MD sent at 04/26/2015  4:07 PM EDT ----- Reviewed. LVEF remains normal. He has moderate aortic regurgitation which is relatively stable. We will continue to follow and discuss further at office visit.

## 2015-05-11 ENCOUNTER — Encounter: Payer: Self-pay | Admitting: Cardiology

## 2015-05-11 ENCOUNTER — Ambulatory Visit (INDEPENDENT_AMBULATORY_CARE_PROVIDER_SITE_OTHER): Payer: Medicaid Other | Admitting: Cardiology

## 2015-05-11 VITALS — BP 164/58 | HR 50 | Ht 73.0 in | Wt 180.8 lb

## 2015-05-11 DIAGNOSIS — I10 Essential (primary) hypertension: Secondary | ICD-10-CM | POA: Diagnosis not present

## 2015-05-11 DIAGNOSIS — R002 Palpitations: Secondary | ICD-10-CM

## 2015-05-11 DIAGNOSIS — I351 Nonrheumatic aortic (valve) insufficiency: Secondary | ICD-10-CM

## 2015-05-11 DIAGNOSIS — I3139 Other pericardial effusion (noninflammatory): Secondary | ICD-10-CM

## 2015-05-11 DIAGNOSIS — I319 Disease of pericardium, unspecified: Secondary | ICD-10-CM | POA: Diagnosis not present

## 2015-05-11 DIAGNOSIS — I313 Pericardial effusion (noninflammatory): Secondary | ICD-10-CM

## 2015-05-11 NOTE — Patient Instructions (Signed)
Your physician recommends that you continue on your current medications as directed. Please refer to the Current Medication list given to you today. Your physician recommends that you schedule a follow-up appointment in: 6 months. You will receive a reminder letter in the mail in about 4 months reminding you to call and schedule your appointment. If you don't receive this letter, please contact our office. 

## 2015-05-11 NOTE — Progress Notes (Signed)
Cardiology Office Note  Date: 05/11/2015   ID: [name], DOB [birth year], MRN [id number]  PCP: Neale Burly, MD  Primary Cardiologist: Rozann Lesches, MD   Chief Complaint  Patient presents with  . Aortic valve disease  . Coronary Artery Disease    History of Present Illness: Rodney Gordon is a 59 y.o. male last seen in March. He presents for a routine follow-up visit. Since last encounter he does not report any significant chest pain or progressing shortness of breath. He had a recent follow-up echocardiogram which is outlined below. Overall stable moderate aortic regurgitation is noted, preserved LVEF. He did have elevated pulmonary pressures, and also a moderate circumferential pericardial effusion without tamponade physiology. He does have chronic kidney disease, but is also on Minoxidil which could be related.  ECG today shows sinus bradycardia with nonspecific T-wave changes.   Past Medical History  Diagnosis Date  . Aortic insufficiency     Mild to moderate  . Essential hypertension, benign   . Coronary atherosclerosis of native coronary artery     Nonobstructive at catheterization 2004  . ESRD (end stage renal disease) ([place])     Current Outpatient Prescriptions  Medication Sig Dispense Refill  . acetaminophen (TYLENOL) 650 MG CR tablet Take 650 mg by mouth as needed for pain.    Marland Kitchen amLODipine (NORVASC) 10 MG tablet Take 1 tablet (10 mg total) by mouth daily. 30 tablet 6  . aspirin 81 MG tablet Take 81 mg by mouth daily.      Marland Kitchen atenolol (TENORMIN) 100 MG tablet Take 100 mg by mouth 2 (two) times daily.    Marland Kitchen doxazosin (CARDURA) 8 MG tablet Take 8 mg by mouth daily.    . furosemide (LASIX) 40 MG tablet Take 40 mg by mouth 2 (two) times daily.     . minoxidil (LONITEN) 10 MG tablet Take 10 mg by mouth daily.    . nitroGLYCERIN (NITROSTAT) 0.4 MG SL tablet Place 1 tablet (0.4 mg total) under the tongue as directed. 25 tablet 2  . tamsulosin (FLOMAX) 0.4 MG CAPS  Take 0.4 mg by mouth daily after supper.    . [DISCONTINUED] isosorbide mononitrate (IMDUR) 30 MG 24 hr tablet Take 30 mg by mouth daily.       No current facility-administered medications for this visit.    Allergies:  Review of patient's allergies indicates no known allergies.   Social History: The patient  reports that he quit smoking about 16 years ago. His smoking use included Cigarettes. He has a 25 pack-year smoking history. He has never used smokeless tobacco. He reports that he does not drink alcohol or use illicit drugs.   ROS:  Please see the history of present illness. Otherwise, complete review of systems is positive for none.  All other systems are reviewed and negative.   Physical Exam: VS:  BP 164/58 mmHg  Pulse 50  Ht 6\' 1"  (1.854 m)  Wt 180 lb 12.8 oz (82.01 kg)  BMI 23.86 kg/m2  SpO2 98%, BMI Body mass index is 23.86 kg/(m^2).  Wt Readings from Last 3 Encounters:  05/11/15 180 lb 12.8 oz (82.01 kg)  09/23/14 188 lb (85.276 kg)  03/25/14 181 lb (82.101 kg)     Comfortable at rest.  HEENT: Conjunctiva and lids normal, oropharynx clear.  Neck: Supple, no elevated JVP or carotid bruits, no thyromegaly.  Lungs: Clear to auscultation, nonlabored breathing at rest.  Cardiac: Regular rate and rhythm, no S3, 2/6  systolic murmur that radiates up on the right, soft diastolic murmur near apex., no pericardial rub.  Abdomen: Nontender, fullness as before, bowel sounds present, no guarding or rebound.  Extremities: No pitting edema, distal pulses 2+.   ECG: ECG is ordered today.  Other Studies Reviewed Today:  Echocardiogram 04/26/2015: Study Conclusions  - Left ventricle: The cavity size was normal. Wall thickness was increased in a pattern of moderate to severe LVH. Systolic function was vigorous. The estimated ejection fraction was in the range of 65% to 70%. Wall motion was normal; there were no regional wall motion abnormalities. Doppler parameters  are consistent with abnormal left ventricular relaxation (grade 1 diastolic dysfunction). - Aortic valve: Mildly calcified annulus. Trileaflet; mildly thickened leaflets. There was moderate regurgitation. The AI VC is 0.3 cm. Valve area (VTI): 3.19 cm^2. Valve area (Vmax): 3.12 cm^2. Valve area (Vmean): 2.93 cm^2. Regurgitation pressure half-time: 463 ms. - Mitral valve: Mildly calcified annulus. Normal thickness leaflets . There was mild regurgitation. - Left atrium: The atrium was moderately dilated. - Tricuspid valve: There was mild-moderate regurgitation. The TR VC is 0.4 cm. - Pulmonary arteries: Systolic pressure was moderately increased. PA peak pressure: 62 mm Hg (S). - Inferior vena cava: The vessel was dilated. The respirophasic diameter changes were blunted (< 50%), consistent with elevated central venous pressure. - Pericardium, extracardiac: There is a moderate circumferential pericardial effusion, it measures 1.5 cm adjacent to the RA. There is no tamponade physiology by echo. - Technically adequate study.  Assessment and Plan:  1. Overall stable, moderate aortic regurgitation. LVEF remains normal in the 65-70% range. No chamber dilatation.   2.  Moderate, circumferential pericardial effusion. He is asymptomatic, no rub on examination. Although he does have chronic kidney disease, he is on minoxidil which may be the culprit. Recommend stopping this medication and changing to a different antihypertensive. Hydralazine might be a good choice for him.  3.  Chronic kidney disease, followed by [name].  Current medicines were reviewed with the patient today.   Orders Placed This Encounter  Procedures  . EKG 12-Lead    Disposition: FU with me in 6 months.   Signed, Satira Sark, MD, [facility] 05/11/2015 2:53 PM    [facility] [place] [place], [place], [place] [address] Phone: [phone]; Fax: [phone]

## 2015-11-10 ENCOUNTER — Encounter: Payer: Self-pay | Admitting: Cardiology

## 2015-11-10 ENCOUNTER — Ambulatory Visit (INDEPENDENT_AMBULATORY_CARE_PROVIDER_SITE_OTHER): Payer: Medicaid Other | Admitting: Cardiology

## 2015-11-10 VITALS — BP 173/70 | HR 55 | Ht 73.0 in | Wt 179.6 lb

## 2015-11-10 DIAGNOSIS — I10 Essential (primary) hypertension: Secondary | ICD-10-CM

## 2015-11-10 DIAGNOSIS — I351 Nonrheumatic aortic (valve) insufficiency: Secondary | ICD-10-CM

## 2015-11-10 DIAGNOSIS — I319 Disease of pericardium, unspecified: Secondary | ICD-10-CM

## 2015-11-10 DIAGNOSIS — N186 End stage renal disease: Secondary | ICD-10-CM | POA: Diagnosis not present

## 2015-11-10 DIAGNOSIS — I3139 Other pericardial effusion (noninflammatory): Secondary | ICD-10-CM

## 2015-11-10 DIAGNOSIS — I2510 Atherosclerotic heart disease of native coronary artery without angina pectoris: Secondary | ICD-10-CM

## 2015-11-10 DIAGNOSIS — I313 Pericardial effusion (noninflammatory): Secondary | ICD-10-CM

## 2015-11-10 NOTE — Progress Notes (Signed)
Cardiology Office Note  Date: 11/10/2015   ID: XAVI BRICH, DOB 01/30/1956, MRN [id number]  PCP: Neale Burly, MD  Primary Cardiologist: Rozann Lesches, MD   Chief Complaint  Patient presents with  . Aortic regurgitation  . Coronary Artery Disease    History of Present Illness: Rodney Gordon is a 60 y.o. male last seen in October 2016. He presents for a routine follow-up visit. He reports NYHA class II dyspnea, no exertional chest pain. He still follows with [name] and [name].  Last echocardiogram was in October of last year. He had moderate aortic regurgitation at that time, also moderate pericardial effusion. He was taken off minoxidil in case it was related.  He does not report any dizziness or syncope. States that his leg edema is somewhat better since stopping minoxidil. Blood pressure is up today, he reports compliance with his medications.  Past Medical History  Diagnosis Date  . Aortic insufficiency     Mild to moderate  . Essential hypertension, benign   . Coronary atherosclerosis of native coronary artery     Nonobstructive at catheterization 2004  . ESRD (end stage renal disease) [facility]     Past Surgical History  Procedure Laterality Date  . Tibia fracture surgery      Left    Current Outpatient Prescriptions  Medication Sig Dispense Refill  . acetaminophen (TYLENOL) 650 MG CR tablet Take 650 mg by mouth as needed for pain.    Marland Kitchen amLODipine (NORVASC) 10 MG tablet Take 1 tablet (10 mg total) by mouth daily. 30 tablet 6  . atenolol (TENORMIN) 100 MG tablet Take 100 mg by mouth 2 (two) times daily.    Marland Kitchen doxazosin (CARDURA) 8 MG tablet Take 8 mg by mouth daily.    . furosemide (LASIX) 40 MG tablet Take 40 mg by mouth 2 (two) times daily.     . hydrALAZINE (APRESOLINE) 50 MG tablet Take 50 mg by mouth 2 (two) times daily.    . nitroGLYCERIN (NITROSTAT) 0.4 MG SL tablet Place 1 tablet (0.4 mg total) under the tongue as directed. 25 tablet 2  .  sodium bicarbonate 650 MG tablet Take 650 mg by mouth 4 (four) times daily.    . tamsulosin (FLOMAX) 0.4 MG CAPS Take 0.4 mg by mouth daily after supper.    Marland Kitchen aspirin 81 MG tablet Take 81 mg by mouth daily. Reported on 11/10/2015    . [DISCONTINUED] isosorbide mononitrate (IMDUR) 30 MG 24 hr tablet Take 30 mg by mouth daily.       No current facility-administered medications for this visit.   Allergies:  Review of patient's allergies indicates no known allergies.   Social History: The patient  reports that he quit smoking about 17 years ago. His smoking use included Cigarettes. He has a 25 pack-year smoking history. He has never used smokeless tobacco. He reports that he does not drink alcohol or use illicit drugs.   ROS:  Please see the history of present illness. Otherwise, complete review of systems is positive for occasional indigestion.  All other systems are reviewed and negative.   Physical Exam: VS:  BP 173/70 mmHg  Pulse 55  Ht 6\' 1"  (1.854 m)  Wt 179 lb 9.6 oz (81.466 kg)  BMI 23.70 kg/m2  SpO2 100%, BMI Body mass index is 23.7 kg/(m^2).  Wt Readings from Last 3 Encounters:  11/10/15 179 lb 9.6 oz (81.466 kg)  05/11/15 180 lb 12.8 oz (82.01 kg)  09/23/14 188 lb (85.276 kg)    Comfortable at rest.  HEENT: Conjunctiva and lids normal, oropharynx clear.  Neck: Supple, no elevated JVP or carotid bruits, no thyromegaly.  Lungs: Clear to auscultation, nonlabored breathing at rest.  Cardiac: Regular rate and rhythm, no S3, 2/6 systolic murmur that radiates up on the right, soft diastolic murmur near apex., no pericardial rub.  Abdomen: Nontender, fullness as before, bowel sounds present, no guarding or rebound.  Extremities: No pitting edema, distal pulses 2+.   ECG: I personally reviewed the prior tracing from 05/11/2015 which showed sinus bradycardia with nonspecific T-wave changes.  Other Studies Reviewed Today:  Echocardiogram 04/26/2015: Study Conclusions  - Left  ventricle: The cavity size was normal. Wall thickness was increased in a pattern of moderate to severe LVH. Systolic function was vigorous. The estimated ejection fraction was in the range of 65% to 70%. Wall motion was normal; there were no regional wall motion abnormalities. Doppler parameters are consistent with abnormal left ventricular relaxation (grade 1 diastolic dysfunction). - Aortic valve: Mildly calcified annulus. Trileaflet; mildly thickened leaflets. There was moderate regurgitation. The AI VC is 0.3 cm. Valve area (VTI): 3.19 cm^2. Valve area (Vmax): 3.12 cm^2. Valve area (Vmean): 2.93 cm^2. Regurgitation pressure half-time: 463 ms. - Mitral valve: Mildly calcified annulus. Normal thickness leaflets . There was mild regurgitation. - Left atrium: The atrium was moderately dilated. - Tricuspid valve: There was mild-moderate regurgitation. The TR VC is 0.4 cm. - Pulmonary arteries: Systolic pressure was moderately increased. PA peak pressure: 62 mm Hg (S). - Inferior vena cava: The vessel was dilated. The respirophasic diameter changes were blunted (< 50%), consistent with elevated central venous pressure. - Pericardium, extracardiac: There is a moderate circumferential pericardial effusion, it measures 1.5 cm adjacent to the RA. There is no tamponade physiology by echo. - Technically adequate study.  Assessment and Plan:  1. Aortic regurgitation, moderate by echocardiogram in October of last year. He is asymptomatic.  2. Moderate pericardial effusion as noted by previous echocardiogram. He has stopped minoxidil since that time. No increasing shortness of breath or syncope. Follow-up study will be obtained for reassessment.  3. Hypertension in the setting of chronic kidney disease. Hydralazine has been added since discontinuation of minoxidil. Keep follow-up with PCP and nephrology for further management.  4. Progressing renal failure,  followed by [name].  5. History of nonobstructive CAD without active angina symptoms. Continues on aspirin.  Current medicines were reviewed with the patient today.   Orders Placed This Encounter  Procedures  . ECHOCARDIOGRAM COMPLETE    Disposition: FU with me in 6 months.   Signed, Satira Sark, MD, [facility] 11/10/2015 11:46 AM    [facility] [place], [place], [place] [address] Phone: [phone]; Fax: [phone]

## 2015-11-10 NOTE — Patient Instructions (Signed)

## 2015-11-22 ENCOUNTER — Other Ambulatory Visit: Payer: Self-pay

## 2015-11-22 ENCOUNTER — Ambulatory Visit (INDEPENDENT_AMBULATORY_CARE_PROVIDER_SITE_OTHER): Payer: Medicaid Other

## 2015-11-22 DIAGNOSIS — I313 Pericardial effusion (noninflammatory): Secondary | ICD-10-CM

## 2015-11-22 DIAGNOSIS — I319 Disease of pericardium, unspecified: Secondary | ICD-10-CM

## 2015-11-22 DIAGNOSIS — I3139 Other pericardial effusion (noninflammatory): Secondary | ICD-10-CM

## 2015-11-22 DIAGNOSIS — I351 Nonrheumatic aortic (valve) insufficiency: Secondary | ICD-10-CM

## 2015-11-24 ENCOUNTER — Telehealth: Payer: Self-pay | Admitting: *Deleted

## 2015-11-24 NOTE — Telephone Encounter (Signed)
Patient informed. 

## 2015-11-24 NOTE — Telephone Encounter (Signed)
-----   Message from Satira Sark, MD sent at 11/22/2015  4:32 PM EDT ----- Reviewed report. Aortic regurgitation remains in moderate range. Pericardial effusion has decreased in size compared to the prior study. Continue with current plan.

## 2016-05-21 ENCOUNTER — Encounter: Payer: Self-pay | Admitting: *Deleted

## 2016-05-21 NOTE — Progress Notes (Signed)
Cardiology Office Note  Date: 05/22/2016   ID: Rodney Gordon, DOB 06/02/1956, MRN [id number]  PCP: Rodney Burly, MD  Primary Cardiologist: Rodney Lesches, MD   Chief Complaint  Patient presents with  . Aortic regurgitation  . Hypertension    History of Present Illness: Rodney Gordon is a 60 y.o. male last seen in April. He presents for a routine follow-up visit today. Reports no chest pain or unusual shortness of breath. He continues to follow with nephrology and also receives treatment for anemia of chronic disease. I am requesting his most recent lab work from Rodney Gordon.  Follow-up echocardiogram in May showed stable, moderate aortic regurgitation. Pericardial effusion had decreased to small size. We reviewed this today.  ECG today shows sinus bradycardia with LVH and repolarization abnormalities.  We went over his medications which are outlined below. He has not been on hydralazine, continues on high doses of Norvasc and atenolol. Blood pressure not well controlled.  Past Medical History:  Diagnosis Date  . Anemia of chronic disease   . Aortic insufficiency    Mild to moderate  . CKD (chronic kidney disease)    Rodney Gordon  . Coronary atherosclerosis of native coronary artery    Nonobstructive at catheterization 2004  . Essential hypertension, benign     Past Surgical History:  Procedure Laterality Date  . TIBIA FRACTURE SURGERY     Left    Current Outpatient Prescriptions  Medication Sig Dispense Refill  . acetaminophen (TYLENOL) 650 MG CR tablet Take 650 mg by mouth as needed for pain.    Marland Kitchen amLODipine (NORVASC) 10 MG tablet Take 1 tablet (10 mg total) by mouth daily. 30 tablet 6  . aspirin 81 MG tablet Take 81 mg by mouth daily. Reported on 11/10/2015    . atenolol (TENORMIN) 100 MG tablet Take 100 mg by mouth 2 (two) times daily.    . Calcium Carbonate-Vitamin D (CALTRATE 600+D PO) Take 1 capsule by mouth daily.    Marland Kitchen doxazosin (CARDURA) 8 MG tablet Take  8 mg by mouth daily.    . furosemide (LASIX) 40 MG tablet Take 40 mg by mouth daily.     Marland Kitchen ibuprofen (ADVIL,MOTRIN) 200 MG tablet Take 200 mg by mouth every 6 (six) hours as needed.    . Magnesium Citrate 100 MG TABS Take 1 tablet by mouth daily.    . nitroGLYCERIN (NITROSTAT) 0.4 MG SL tablet Place 1 tablet (0.4 mg total) under the tongue as directed. 25 tablet 2  . Omega-3 Fatty Acids (FISH OIL) 1000 MG CAPS Take 1 capsule by mouth daily.    . sodium bicarbonate 650 MG tablet Take 1,300 mg by mouth 3 (three) times daily.     . tamsulosin (FLOMAX) 0.4 MG CAPS Take 0.4 mg by mouth daily after supper.    . hydrALAZINE (APRESOLINE) 25 MG tablet Take 1 tablet (25 mg total) by mouth 2 (two) times daily. 60 tablet 6   No current facility-administered medications for this visit.    Allergies:  Review of patient's allergies indicates no known allergies.   Social History: The patient  reports that he quit smoking about 17 years ago. His smoking use included Cigarettes. He started smoking about 34 years ago. He has a 25.00 pack-year smoking history. He has never used smokeless tobacco. He reports that he does not drink alcohol or use drugs.   ROS:  Please see the history of present illness. Otherwise, complete review of systems is  positive for none.  All other systems are reviewed and negative.   Physical Exam: VS:  BP (!) 187/79 (BP Location: Right Arm, Cuff Size: Normal)   Pulse (!) 46   Ht 5\' 10"  (1.778 m)   Wt 182 lb (82.6 kg)   BMI 26.11 kg/m , BMI Body mass index is 26.11 kg/m.  Wt Readings from Last 3 Encounters:  05/22/16 182 lb (82.6 kg)  11/10/15 179 lb 9.6 oz (81.5 kg)  05/11/15 180 lb 12.8 oz (82 kg)    Comfortable at rest.  HEENT: Conjunctiva and lids normal, oropharynx clear.  Neck: Supple, no elevated JVP or carotid bruits, no thyromegaly.  Lungs: Clear to auscultation, nonlabored breathing at rest.  Cardiac: Regular rate and rhythm, no S3, 2/6 systolic murmur that  radiates up on the right, soft diastolic murmur near apex., no pericardial rub.  Abdomen: Nontender, fullness as before, bowel sounds present, no guarding or rebound.  Extremities: No pitting edema, distal pulses 2+.  Skin: Warm and dry. Musculoskeletal: No kyphosis. Neuropsychiatric: Alert and oriented 3, affect appropriate.  ECG: I personally reviewed the tracing from 05/11/2015 which showed sinus bradycardia with nonspecific T-wave abnormalities.  Other Studies Reviewed Today:  Echocardiogram 11/22/2015: Study Conclusions  - Left ventricle: The cavity size was normal. Wall thickness was   increased in a pattern of mild LVH. Systolic function was normal.   The estimated ejection fraction was in the range of 55% to 60%.   Doppler parameters are consistent with abnormal left ventricular   relaxation (grade 1 diastolic dysfunction). - Regional wall motion abnormality: Hypokinesis of the basal-mid   anterolateral and apical lateral myocardium. - Aortic valve: Mildly calcified annulus. Trileaflet; mildly   thickened leaflets. There was moderate regurgitation. The AI VC   is 0.4 cm. Valve area (VTI): 1.89 cm^2. Valve area (Vmax): 1.67   cm^2. Valve area (Vmean): 1.82 cm^2. Regurgitation pressure   half-time: 658 ms. - Mitral valve: Mildly calcified annulus. Mildly thickened leaflets   . There was mild regurgitation. - Left atrium: The atrium was severely dilated. - Atrial septum: No defect or patent foramen ovale was identified. - Pulmonary arteries: Systolic pressure was moderately increased.   PA peak pressure: 50 mm Hg (S). - Pericardium, extracardiac: There is a small circumferential   pericardial effusion measuring 0.5 cm adjacent to the LV in   diastole. There is no evidence of tamponade physiology. - Technically adequate study.  Assessment and Plan:  1. Moderate aortic regurgitation, stable by follow-up echocardiogram earlier this year. LVEF 55-60%.  2. Essential  hypertension, not well controlled. Adding hydralazine back to his regimen, will start at 55m g twice daily. This can be further uptitrated.  3.Chronic kidney disease followed by Rodney Gordon. Requesting most recent lab work from Rodney Gordon.  4. Pericardial effusion, small by follow-up echocardiogram. He was taken off minoxidil.  Current medicines were reviewed with the patient today.   Orders Placed This Encounter  Procedures  . EKG 12-Lead    Disposition: Follow-up in 6 months.   Signed, Satira Sark, MD, [facility] 05/22/2016 4:30 PM    [facility] [place], [place], [place] [address] Phone: [phone]; Fax: [phone]

## 2016-05-22 ENCOUNTER — Ambulatory Visit (INDEPENDENT_AMBULATORY_CARE_PROVIDER_SITE_OTHER): Payer: Medicaid Other | Admitting: Cardiology

## 2016-05-22 ENCOUNTER — Encounter: Payer: Self-pay | Admitting: *Deleted

## 2016-05-22 ENCOUNTER — Encounter: Payer: Self-pay | Admitting: Cardiology

## 2016-05-22 VITALS — BP 187/79 | HR 46 | Ht 70.0 in | Wt 182.0 lb

## 2016-05-22 DIAGNOSIS — I351 Nonrheumatic aortic (valve) insufficiency: Secondary | ICD-10-CM | POA: Diagnosis not present

## 2016-05-22 DIAGNOSIS — I3139 Other pericardial effusion (noninflammatory): Secondary | ICD-10-CM

## 2016-05-22 DIAGNOSIS — I10 Essential (primary) hypertension: Secondary | ICD-10-CM | POA: Diagnosis not present

## 2016-05-22 DIAGNOSIS — N189 Chronic kidney disease, unspecified: Secondary | ICD-10-CM | POA: Diagnosis not present

## 2016-05-22 DIAGNOSIS — I313 Pericardial effusion (noninflammatory): Secondary | ICD-10-CM | POA: Diagnosis not present

## 2016-05-22 MED ORDER — HYDRALAZINE HCL 25 MG PO TABS: 25.0000 mg | ORAL_TABLET | Freq: Two times a day (BID) | ORAL | 6 refills | Status: DC

## 2016-05-22 NOTE — Patient Instructions (Addendum)
Medication Instructions:   Begin Hydralazine 25mg  twice a day.    Continue all other medications.    Labwork: none  Testing/Procedures: none  Follow-Up: Your physician wants you to follow up in: 6 months.  You will receive a reminder letter in the mail one-two months in advance.  If you don't receive a letter, please call our office to schedule the follow up appointment   Any Other Special Instructions Will Be Listed Below (If Applicable).  If you need a refill on your cardiac medications before your next appointment, please call your pharmacy.

## 2016-09-06 ENCOUNTER — Other Ambulatory Visit: Payer: Self-pay | Admitting: Vascular Surgery

## 2016-09-06 DIAGNOSIS — N185 Chronic kidney disease, stage 5: Secondary | ICD-10-CM

## 2016-09-06 DIAGNOSIS — Z01810 Encounter for preprocedural cardiovascular examination: Secondary | ICD-10-CM

## 2016-09-20 ENCOUNTER — Encounter: Payer: Self-pay | Admitting: Vascular Surgery

## 2016-10-02 ENCOUNTER — Encounter: Payer: Medicaid Other | Admitting: Vascular Surgery

## 2016-10-02 ENCOUNTER — Ambulatory Visit (HOSPITAL_COMMUNITY): Payer: Medicaid Other

## 2016-10-02 ENCOUNTER — Other Ambulatory Visit (HOSPITAL_COMMUNITY): Payer: Medicaid Other

## 2016-11-18 NOTE — Progress Notes (Signed)
Cardiology Office Note  Date: 11/19/2016   ID: ALDO SONDGEROTH, DOB [birth year], MRN [id number]  PCP: Neale Burly, MD  Primary Cardiologist: Rozann Lesches, MD   Chief Complaint  Patient presents with  . Aortic regurgitation    History of Present Illness: Rodney Gordon is a 61 y.o. male last seen in November 2017. He presents for a routine follow-up visit. States that he was hospitalized at [facility] back in February with severe anemia noted on routine blood work per nephrology. States that he did receive packed red cell transfusions, records being requested. Uncertain if this was felt to be related to GI blood loss or not. He does say that his renal function is further deteriorated, he follows with [name].  I reviewed his medications. Antihypertensive regimen is stable. He reports compliance with his medications.  His last echocardiogram was in May 2017 as outlined below.  Past Medical History:  Diagnosis Date  . Anemia of chronic disease   . Aortic insufficiency    Mild to moderate  . CKD (chronic kidney disease)    [name]  . Coronary atherosclerosis of native coronary artery    Nonobstructive at catheterization 2004  . Essential hypertension, benign     Past Surgical History:  Procedure Laterality Date  . TIBIA FRACTURE SURGERY     Left    Current Outpatient Prescriptions  Medication Sig Dispense Refill  . acetaminophen (TYLENOL) 650 MG CR tablet Take 650 mg by mouth as needed for pain.    Marland Kitchen amLODipine (NORVASC) 10 MG tablet Take 1 tablet (10 mg total) by mouth daily. 30 tablet 6  . aspirin 81 MG tablet Take 81 mg by mouth daily. Reported on 11/10/2015    . atenolol (TENORMIN) 100 MG tablet Take 100 mg by mouth 2 (two) times daily.    . calcium acetate (PHOSLO) 667 MG capsule Take 667 mg by mouth 3 (three) times daily with meals.    . Calcium Carbonate-Vitamin D (CALTRATE 600+D PO) Take 1 capsule by mouth daily.    Marland Kitchen doxazosin  (CARDURA) 8 MG tablet Take 8 mg by mouth daily.    Marland Kitchen epoetin alfa (EPOGEN,PROCRIT) 01093 UNIT/ML injection Inject 10,000 Units into the skin once a week.    . Ferrous Sulfate (IRON) 325 (65 Fe) MG TABS Take by mouth as needed. When he gets to feeling tired & sluggish.    . furosemide (LASIX) 80 MG tablet Take 80 mg by mouth 2 (two) times daily.    . hydrALAZINE (APRESOLINE) 25 MG tablet Take 1 tablet (25 mg total) by mouth 2 (two) times daily. 60 tablet 6  . ibuprofen (ADVIL,MOTRIN) 200 MG tablet Take 200 mg by mouth every 6 (six) hours as needed.    . Magnesium Citrate 100 MG TABS Take 1 tablet by mouth daily.    . mycophenolate (CELLCEPT) 250 MG capsule Take 250 mg by mouth 2 (two) times daily.    . nitroGLYCERIN (NITROSTAT) 0.4 MG SL tablet Place 1 tablet (0.4 mg total) under the tongue as directed. 25 tablet 2  . Omega-3 Fatty Acids (FISH OIL) 1000 MG CAPS Take 1 capsule by mouth daily.    . sodium bicarbonate 650 MG tablet Take 1,300 mg by mouth 3 (three) times daily.     . tamsulosin (FLOMAX) 0.4 MG CAPS Take 0.4 mg by mouth daily after supper.     No current facility-administered medications for this visit.    Allergies:  Patient  has no known allergies.   Social History: The patient  reports that he quit smoking about 18 years ago. His smoking use included Cigarettes. He started smoking about 35 years ago. He has a 25.00 pack-year smoking history. He has never used smokeless tobacco. He reports that he does not drink alcohol or use drugs.   ROS:  Please see the history of present illness. Otherwise, complete review of systems is positive for no reported changes in stools.  All other systems are reviewed and negative.   Physical Exam: VS:  BP (!) 188/80   Pulse (!) 51   Ht 5\' 10"  (1.778 m)   Wt 178 lb 3.2 oz (80.8 kg)   SpO2 100%   BMI 25.57 kg/m , BMI Body mass index is 25.57 kg/m.  Wt Readings from Last 3 Encounters:  11/19/16 178 lb 3.2 oz (80.8 kg)  05/22/16 182 lb (82.6  kg)  11/10/15 179 lb 9.6 oz (81.5 kg)    Comfortable at rest.  HEENT: Conjunctiva and lids normal, oropharynx clear.  Neck: Supple, no elevated JVP or carotid bruits, no thyromegaly.  Lungs: Clear to auscultation, nonlabored breathing at rest.  Cardiac: Regular rate and rhythm, no S3, 2/6 systolic murmur that radiates up on the right, soft diastolic murmur near apex., no pericardial rub.  Abdomen: Nontender, fullness as before, bowel sounds present, no guarding or rebound.  Extremities: No pitting edema, distal pulses 2+.  Skin: Warm and dry. Musculoskeletal: No kyphosis. Neuropsychiatric: Alert and oriented 3, affect appropriate.  ECG: I personally reviewed the tracing from 05/22/2016 showed sinus bradycardia with increased voltage and nonspecific ST-T changes.  Recent Labwork:  July 2017: Cholesterol 112, triglycerides 125, HDL 39, LDL 48, hemoglobin 9.0, platelets 256   Other Studies Reviewed Today:  Echocardiogram 11/22/2015: Study Conclusions  - Left ventricle: The cavity size was normal. Wall thickness was   increased in a pattern of mild LVH. Systolic function was normal.   The estimated ejection fraction was in the range of 55% to 60%.   Doppler parameters are consistent with abnormal left ventricular   relaxation (grade 1 diastolic dysfunction). - Regional wall motion abnormality: Hypokinesis of the basal-mid   anterolateral and apical lateral myocardium. - Aortic valve: Mildly calcified annulus. Trileaflet; mildly   thickened leaflets. There was moderate regurgitation. The AI VC   is 0.4 cm. Valve area (VTI): 1.89 cm^2. Valve area (Vmax): 1.67   cm^2. Valve area (Vmean): 1.82 cm^2. Regurgitation pressure   half-time: 658 ms. - Mitral valve: Mildly calcified annulus. Mildly thickened leaflets   . There was mild regurgitation. - Left atrium: The atrium was severely dilated. - Atrial septum: No defect or patent foramen ovale was identified. - Pulmonary arteries:  Systolic pressure was moderately increased.   PA peak pressure: 50 mm Hg (S). - Pericardium, extracardiac: There is a small circumferential   pericardial effusion measuring 0.5 cm adjacent to the LV in   diastole. There is no evidence of tamponade physiology. - Technically adequate study.  Assessment and Plan:  1. Aortic valve disease, moderate aortic regurgitation by echocardiogram last year. Follow-up study will be arranged.  2. Essential hypertension blood pressure control complicated by progressive renal disease. No changes made in present antihypertensive regimen.  3. Chronic kidney disease followed by [name]. Recent lab work being requested. Patient states that his renal function has further deteriorated.  4. Anemia of chronic disease, acute on chronic anemia as of February with hospitalization. Records being requested.  Current medicines were  reviewed with the patient today.   Orders Placed This Encounter  Procedures  . ECHOCARDIOGRAM COMPLETE    Disposition: Follow-up in 6 months.  Signed, Satira Sark, MD, [facility] 11/19/2016 1:16 PM    [facility] [place], [place], [place] [address] Phone: [phone]; Fax: [phone]

## 2016-11-19 ENCOUNTER — Ambulatory Visit (INDEPENDENT_AMBULATORY_CARE_PROVIDER_SITE_OTHER): Payer: Medicaid Other | Admitting: Cardiology

## 2016-11-19 ENCOUNTER — Telehealth: Payer: Self-pay | Admitting: Cardiology

## 2016-11-19 ENCOUNTER — Encounter: Payer: Self-pay | Admitting: Cardiology

## 2016-11-19 ENCOUNTER — Encounter: Payer: Self-pay | Admitting: Vascular Surgery

## 2016-11-19 VITALS — BP 188/80 | HR 51 | Ht 70.0 in | Wt 178.2 lb

## 2016-11-19 DIAGNOSIS — D638 Anemia in other chronic diseases classified elsewhere: Secondary | ICD-10-CM

## 2016-11-19 DIAGNOSIS — N189 Chronic kidney disease, unspecified: Secondary | ICD-10-CM

## 2016-11-19 DIAGNOSIS — I10 Essential (primary) hypertension: Secondary | ICD-10-CM | POA: Diagnosis not present

## 2016-11-19 DIAGNOSIS — I351 Nonrheumatic aortic (valve) insufficiency: Secondary | ICD-10-CM

## 2016-11-19 MED ORDER — NITROGLYCERIN 0.4 MG SL SUBL: 0.4000 mg | SUBLINGUAL_TABLET | SUBLINGUAL | 2 refills | Status: AC

## 2016-11-19 MED ORDER — HYDRALAZINE HCL 25 MG PO TABS: 25.0000 mg | ORAL_TABLET | Freq: Two times a day (BID) | ORAL | 6 refills | Status: DC

## 2016-11-19 NOTE — Telephone Encounter (Signed)
Echo scheduled in [place] on Dec 10, 2016

## 2016-11-19 NOTE — Patient Instructions (Signed)

## 2016-11-28 ENCOUNTER — Ambulatory Visit (HOSPITAL_COMMUNITY)
Admission: RE | Admit: 2016-11-28 | Discharge: 2016-11-28 | Disposition: A | Discharge status: Home / Self Care | Payer: Medicaid Other | Source: Ambulatory Visit | Attending: Vascular Surgery | Admitting: Vascular Surgery

## 2016-11-28 ENCOUNTER — Encounter: Payer: Self-pay | Admitting: Vascular Surgery

## 2016-11-28 ENCOUNTER — Ambulatory Visit (INDEPENDENT_AMBULATORY_CARE_PROVIDER_SITE_OTHER)
Admission: RE | Admit: 2016-11-28 | Discharge: 2016-11-28 | Disposition: A | Discharge status: Home / Self Care | Payer: Medicaid Other | Source: Ambulatory Visit | Attending: Vascular Surgery | Admitting: Vascular Surgery

## 2016-11-28 ENCOUNTER — Ambulatory Visit (INDEPENDENT_AMBULATORY_CARE_PROVIDER_SITE_OTHER): Payer: Medicaid Other | Admitting: Vascular Surgery

## 2016-11-28 VITALS — BP 192/73 | HR 49 | Temp 96.7°F | Resp 20 | Ht 70.0 in | Wt 180.0 lb

## 2016-11-28 DIAGNOSIS — N185 Chronic kidney disease, stage 5: Secondary | ICD-10-CM

## 2016-11-28 DIAGNOSIS — Z01810 Encounter for preprocedural cardiovascular examination: Secondary | ICD-10-CM | POA: Diagnosis not present

## 2016-11-28 NOTE — Progress Notes (Signed)
Referring Physician: Dr Mercy Moore  Patient name: Rodney Gordon MRN: [id number] DOB: August 09, 1955 Sex: male  REASON FOR CONSULT: CKD 5 hemodialysis access  HPI: Rodney Gordon is a 61 y.o. male sent for evaluation for long-term hemodialysis access. The patient is not currently on dialysis. He is right handed. He currently does not work and is on disability from cardiac dysfunction. He states he did see Dr. Domenic Polite about 2 weeks ago. He has an echo scheduled for May 22. The patient become short of breath when walking on an incline or on short distances. He occasionally gets some chest pain.   Past Medical History:  Diagnosis Date  . Anemia of chronic disease   . Aortic insufficiency    Mild to moderate  . CKD (chronic kidney disease)    [name]  . Coronary atherosclerosis of native coronary artery    Nonobstructive at catheterization 2004  . Essential hypertension, benign   . Peripheral vascular disease [facility])    Past Surgical History:  Procedure Laterality Date  . TIBIA FRACTURE SURGERY     Left    Family History  Problem Relation Age of Onset  . Cancer Other   . Coronary artery disease Other     SOCIAL HISTORY: Social History   Social History  . Marital status: Single    Spouse name: N/A  . Number of children: N/A  . Years of education: N/A   Occupational History  . Not on file.   Social History Main Topics  . Smoking status: Former Smoker    Packs/day: 1.00    Years: 25.00    Types: Cigarettes    Start date: 07/07/1981    Quit date: 07/22/1998  . Smokeless tobacco: Never Used  . Alcohol use No  . Drug use: No  . Sexual activity: Not on file   Other Topics Concern  . Not on file   Social History Narrative  . No narrative on file    No Known Allergies  Current Outpatient Prescriptions  Medication Sig Dispense Refill  . acetaminophen (TYLENOL) 650 MG CR tablet Take 650 mg by mouth as needed for pain.    Marland Kitchen amLODipine (NORVASC) 10 MG tablet Take 1  tablet (10 mg total) by mouth daily. 30 tablet 6  . aspirin 81 MG tablet Take 81 mg by mouth daily. Reported on 11/10/2015    . atenolol (TENORMIN) 100 MG tablet Take 100 mg by mouth 2 (two) times daily.    . calcium acetate (PHOSLO) 667 MG capsule Take 667 mg by mouth 3 (three) times daily with meals.    . Calcium Carbonate-Vitamin D (CALTRATE 600+D PO) Take 1 capsule by mouth daily.    Marland Kitchen doxazosin (CARDURA) 8 MG tablet Take 8 mg by mouth daily.    Marland Kitchen epoetin alfa (EPOGEN,PROCRIT) 63893 UNIT/ML injection Inject 10,000 Units into the skin once a week.    . Ferrous Sulfate (IRON) 325 (65 Fe) MG TABS Take by mouth as needed. When he gets to feeling tired & sluggish.    . furosemide (LASIX) 80 MG tablet Take 80 mg by mouth 2 (two) times daily.    . hydrALAZINE (APRESOLINE) 25 MG tablet Take 1 tablet (25 mg total) by mouth 2 (two) times daily. 60 tablet 6  . ibuprofen (ADVIL,MOTRIN) 200 MG tablet Take 200 mg by mouth every 6 (six) hours as needed.    . Magnesium Citrate 100 MG TABS Take 1 tablet by mouth daily.    Marland Kitchen  mycophenolate (CELLCEPT) 250 MG capsule Take 250 mg by mouth 2 (two) times daily.    . nitroGLYCERIN (NITROSTAT) 0.4 MG SL tablet Place 1 tablet (0.4 mg total) under the tongue as directed. 25 tablet 2  . Omega-3 Fatty Acids (FISH OIL) 1000 MG CAPS Take 1 capsule by mouth daily.    . sodium bicarbonate 650 MG tablet Take 1,300 mg by mouth 3 (three) times daily.     . tamsulosin (FLOMAX) 0.4 MG CAPS Take 0.4 mg by mouth daily after supper.     No current facility-administered medications for this visit.     ROS:   General:  No weight loss, Fever, chills  HEENT: No recent headaches, no nasal bleeding, no visual changes, no sore throat  Neurologic: No dizziness, blackouts, seizures. No recent symptoms of stroke or mini- stroke. No recent episodes of slurred speech, or temporary blindness.  Cardiac: + recent episodes of chest pain/pressure, no shortness of breath at rest.  + shortness  of breath with exertion.  Denies history of atrial fibrillation or irregular heartbeat  Vascular: No history of rest pain in feet.  No history of claudication.  No history of non-healing ulcer, No history of DVT   Pulmonary: No home oxygen, no productive cough, no hemoptysis,  No asthma or wheezing  Musculoskeletal:  [ ]  Arthritis, [ ]  Low back pain,  [ ]  Joint pain  Hematologic:No history of hypercoagulable state.  No history of easy bleeding.  No history of anemia  Gastrointestinal: No hematochezia or melena,  No gastroesophageal reflux, no trouble swallowing  Urinary: [X]  chronic Kidney disease, [ ]  on HD - [ ]  MWF or [ ]  TTHS, [ ]  Burning with urination, [ ]  Frequent urination, [ ]  Difficulty urinating;   Skin: No rashes  Psychological: No history of anxiety,  No history of depression   Physical Examination  Vitals:   11/28/16 1415 11/28/16 1418  BP: (!) 194/74 (!) 192/73  Pulse: (!) 49   Resp: 20   Temp: (!) 96.7 F (35.9 C)   TempSrc: Oral   SpO2: 100%   Weight: 180 lb (81.6 kg)   Height: 5\' 10"  (1.778 m)     Body mass index is 25.83 kg/m.  General:  Alert and oriented, no acute distress HEENT: Normal Neck: No bruit or JVD Pulmonary: Clear to auscultation bilaterally Cardiac: Regular Rate and Rhythm without murmur Abdomen: Soft, non-tender, non-distended, no mass, no scars Skin: No rash Extremity Pulses:  2+ radial, brachial, femoral, dorsalis pedis, posterior tibial pulses bilaterally Musculoskeletal: No deformity or edema  Neurologic: Upper and lower extremity motor 5/5 and symmetric  DATA:  Had a vein mapping ultrasound today which shows the right cephalic vein is 3 mm in diameter in the upper arm. It is small in the forearm. The basilic vein is 3-4 mm in the right arm. The cephalic vein is diffusely small in the left arm. Basilic vein is 3 mm in the left arm. He also had arterial duplex exam which showed normal brachial artery anatomy.  ASSESSMENT:   Patient needs long-term hemodialysis access. Believe his best option for his initial access would be a right brachiocephalic AV fistula.   PLAN:  Right brachiocephalic AV fistula scheduled for 12/17/2016. Risks benefits possible, the patient's procedure details were explained to the patient today including but limited to bleeding infection non-maturation of the fistula ischemic steal. He understands and agrees to proceed. We will delay scheduling his fistula until after his echocardiogram.  Ruta Hinds, MD  [facility] Office: [phone] Pager: [phone]

## 2016-11-29 ENCOUNTER — Other Ambulatory Visit: Payer: Self-pay

## 2016-12-10 ENCOUNTER — Other Ambulatory Visit: Payer: Self-pay

## 2016-12-10 ENCOUNTER — Ambulatory Visit (INDEPENDENT_AMBULATORY_CARE_PROVIDER_SITE_OTHER): Payer: Medicaid Other

## 2016-12-10 ENCOUNTER — Telehealth: Payer: Self-pay

## 2016-12-10 DIAGNOSIS — I351 Nonrheumatic aortic (valve) insufficiency: Secondary | ICD-10-CM

## 2016-12-10 NOTE — Telephone Encounter (Signed)
-----   Message from Satira Sark, MD sent at 12/10/2016  1:01 PM EDT ----- Results reviewed. LVEF remains relatively stable, there is some mild LV enlargement, but overall degree of aortic regurgitation is still in the moderate range. Pulmonary pressures remain elevated as well. Continue with current plan. A copy of this test should be forwarded to Neale Burly, MD.

## 2016-12-10 NOTE — Telephone Encounter (Signed)
Patient notified. Routed to PCP 

## 2016-12-13 NOTE — Progress Notes (Signed)
Left generic instructions regarding DOS 12/17/16

## 2016-12-17 ENCOUNTER — Ambulatory Visit (HOSPITAL_COMMUNITY)
Admission: RE | Admit: 2016-12-17 | Discharge: 2016-12-17 | Disposition: A | Discharge status: Home / Self Care | Payer: Medicaid Other | Source: Ambulatory Visit | Attending: Vascular Surgery | Admitting: Vascular Surgery

## 2016-12-17 ENCOUNTER — Encounter (HOSPITAL_COMMUNITY)
Admission: RE | Disposition: A | Discharge status: Home / Self Care | Payer: Self-pay | Source: Ambulatory Visit | Attending: Vascular Surgery

## 2016-12-17 ENCOUNTER — Telehealth: Payer: Self-pay | Admitting: Vascular Surgery

## 2016-12-17 ENCOUNTER — Encounter (HOSPITAL_COMMUNITY): Payer: Self-pay | Admitting: *Deleted

## 2016-12-17 ENCOUNTER — Ambulatory Visit (HOSPITAL_COMMUNITY): Payer: Medicaid Other | Admitting: Certified Registered"

## 2016-12-17 DIAGNOSIS — Z8249 Family history of ischemic heart disease and other diseases of the circulatory system: Secondary | ICD-10-CM | POA: Insufficient documentation

## 2016-12-17 DIAGNOSIS — I2510 Atherosclerotic heart disease of native coronary artery without angina pectoris: Secondary | ICD-10-CM | POA: Diagnosis not present

## 2016-12-17 DIAGNOSIS — I120 Hypertensive chronic kidney disease with stage 5 chronic kidney disease or end stage renal disease: Secondary | ICD-10-CM | POA: Insufficient documentation

## 2016-12-17 DIAGNOSIS — N186 End stage renal disease: Secondary | ICD-10-CM | POA: Insufficient documentation

## 2016-12-17 DIAGNOSIS — Z809 Family history of malignant neoplasm, unspecified: Secondary | ICD-10-CM | POA: Diagnosis not present

## 2016-12-17 DIAGNOSIS — N185 Chronic kidney disease, stage 5: Secondary | ICD-10-CM | POA: Diagnosis not present

## 2016-12-17 DIAGNOSIS — R079 Chest pain, unspecified: Secondary | ICD-10-CM | POA: Diagnosis not present

## 2016-12-17 DIAGNOSIS — Z79899 Other long term (current) drug therapy: Secondary | ICD-10-CM | POA: Insufficient documentation

## 2016-12-17 DIAGNOSIS — I739 Peripheral vascular disease, unspecified: Secondary | ICD-10-CM | POA: Diagnosis not present

## 2016-12-17 DIAGNOSIS — I351 Nonrheumatic aortic (valve) insufficiency: Secondary | ICD-10-CM | POA: Insufficient documentation

## 2016-12-17 DIAGNOSIS — Z7982 Long term (current) use of aspirin: Secondary | ICD-10-CM | POA: Insufficient documentation

## 2016-12-17 DIAGNOSIS — Z87891 Personal history of nicotine dependence: Secondary | ICD-10-CM | POA: Insufficient documentation

## 2016-12-17 HISTORY — PX: AV FISTULA PLACEMENT: SHX1204

## 2016-12-17 LAB — POCT I-STAT 4, (NA,K, GLUC, HGB,HCT)
Glucose, Bld: 101 mg/dL — ABNORMAL HIGH (ref 65–99)
HCT: 27 % — ABNORMAL LOW (ref 39.0–52.0)
Hemoglobin: 9.2 g/dL — ABNORMAL LOW (ref 13.0–17.0)
Potassium: 4.4 mmol/L (ref 3.5–5.1)
Sodium: 144 mmol/L (ref 135–145)

## 2016-12-17 SURGERY — ARTERIOVENOUS (AV) FISTULA CREATION
Anesthesia: General | Site: Arm Upper | Laterality: Right

## 2016-12-17 MED ORDER — DEXTROSE 5 % IV SOLN
1.5000 g | INTRAVENOUS | Status: AC
  Administered 2016-12-17: 1.5 g via INTRAVENOUS
  Filled 2016-12-17: qty 1.5

## 2016-12-17 MED ORDER — LIDOCAINE HCL (PF) 1 % IJ SOLN
INTRAMUSCULAR | Status: DC | PRN
  Administered 2016-12-17: 20 mL

## 2016-12-17 MED ORDER — 0.9 % SODIUM CHLORIDE (POUR BTL) OPTIME
TOPICAL | Status: DC | PRN
  Administered 2016-12-17: 1000 mL

## 2016-12-17 MED ORDER — FENTANYL CITRATE (PF) 250 MCG/5ML IJ SOLN
INTRAMUSCULAR | Status: AC
  Filled 2016-12-17: qty 5

## 2016-12-17 MED ORDER — FENTANYL CITRATE (PF) 100 MCG/2ML IJ SOLN
INTRAMUSCULAR | Status: DC | PRN
Start: 2016-12-17 — End: 2016-12-17
  Administered 2016-12-17: 50 ug via INTRAVENOUS

## 2016-12-17 MED ORDER — MIDAZOLAM HCL 2 MG/2ML IJ SOLN
INTRAMUSCULAR | Status: AC
  Filled 2016-12-17: qty 2

## 2016-12-17 MED ORDER — OXYCODONE-ACETAMINOPHEN 5-325 MG PO TABS
1.0000 | ORAL_TABLET | Freq: Four times a day (QID) | ORAL | 0 refills | Status: DC | PRN
Start: 2016-12-17 — End: 2017-09-05

## 2016-12-17 MED ORDER — SODIUM CHLORIDE 0.9 % IV SOLN
INTRAVENOUS | Status: DC | PRN
  Administered 2016-12-17: 10:00:00

## 2016-12-17 MED ORDER — PROPOFOL 500 MG/50ML IV EMUL
INTRAVENOUS | Status: DC | PRN
  Administered 2016-12-17: 50 ug/kg/min via INTRAVENOUS

## 2016-12-17 MED ORDER — SODIUM CHLORIDE 0.9 % IV SOLN
INTRAVENOUS | Status: DC
  Administered 2016-12-17 (×2): via INTRAVENOUS

## 2016-12-17 MED ORDER — CHLORHEXIDINE GLUCONATE CLOTH 2 % EX PADS: 6.0000 | MEDICATED_PAD | Freq: Once | CUTANEOUS | Status: DC

## 2016-12-17 MED ORDER — LIDOCAINE HCL 1 % IJ SOLN
INTRAMUSCULAR | Status: AC
  Filled 2016-12-17: qty 20

## 2016-12-17 MED ORDER — HEPARIN SODIUM (PORCINE) 1000 UNIT/ML IJ SOLN
INTRAMUSCULAR | Status: DC | PRN
  Administered 2016-12-17: 5000 [IU] via INTRAVENOUS

## 2016-12-17 MED ORDER — MIDAZOLAM HCL 5 MG/5ML IJ SOLN
INTRAMUSCULAR | Status: DC | PRN
  Administered 2016-12-17: 1 mg via INTRAVENOUS

## 2016-12-17 MED ORDER — PHENYLEPHRINE 40 MCG/ML (10ML) SYRINGE FOR IV PUSH (FOR BLOOD PRESSURE SUPPORT)
PREFILLED_SYRINGE | INTRAVENOUS | Status: AC
  Filled 2016-12-17: qty 10

## 2016-12-17 MED ORDER — PROTAMINE SULFATE 10 MG/ML IV SOLN
INTRAVENOUS | Status: DC | PRN
  Administered 2016-12-17: 10 mg via INTRAVENOUS
  Administered 2016-12-17 (×2): 20 mg via INTRAVENOUS

## 2016-12-17 MED ORDER — PROPOFOL 10 MG/ML IV BOLUS
INTRAVENOUS | Status: AC
  Filled 2016-12-17: qty 20

## 2016-12-17 SURGICAL SUPPLY — 33 items
ARMBAND PINK RESTRICT EXTREMIT (MISCELLANEOUS) ×4 IMPLANT
CANISTER SUCT 3000ML PPV (MISCELLANEOUS) ×2 IMPLANT
CANNULA VESSEL 3MM 2 BLNT TIP (CANNULA) ×2 IMPLANT
CLIP TI MEDIUM 6 (CLIP) ×2 IMPLANT
CLIP TI WIDE RED SMALL 6 (CLIP) ×2 IMPLANT
DECANTER SPIKE VIAL GLASS SM (MISCELLANEOUS) ×2 IMPLANT
DERMABOND ADVANCED (GAUZE/BANDAGES/DRESSINGS) ×1
DERMABOND ADVANCED .7 DNX12 (GAUZE/BANDAGES/DRESSINGS) ×1 IMPLANT
DRAIN PENROSE 1/4X12 LTX STRL (WOUND CARE) ×2 IMPLANT
ELECT REM PT RETURN 9FT ADLT (ELECTROSURGICAL) ×2
ELECTRODE REM PT RTRN 9FT ADLT (ELECTROSURGICAL) ×1 IMPLANT
GLOVE BIO SURGEON STRL SZ 6.5 (GLOVE) ×2 IMPLANT
GLOVE BIO SURGEON STRL SZ7.5 (GLOVE) ×2 IMPLANT
GLOVE BIOGEL PI IND STRL 6.5 (GLOVE) ×2 IMPLANT
GLOVE BIOGEL PI IND STRL 7.0 (GLOVE) ×2 IMPLANT
GLOVE BIOGEL PI INDICATOR 6.5 (GLOVE) ×2
GLOVE BIOGEL PI INDICATOR 7.0 (GLOVE) ×2
GLOVE ECLIPSE 6.5 STRL STRAW (GLOVE) ×2 IMPLANT
GOWN STRL REUS W/ TWL LRG LVL3 (GOWN DISPOSABLE) ×3 IMPLANT
GOWN STRL REUS W/TWL LRG LVL3 (GOWN DISPOSABLE) ×3
KIT BASIN OR (CUSTOM PROCEDURE TRAY) ×2 IMPLANT
KIT ROOM TURNOVER OR (KITS) ×2 IMPLANT
NS IRRIG 1000ML POUR BTL (IV SOLUTION) ×2 IMPLANT
PACK CV ACCESS (CUSTOM PROCEDURE TRAY) ×2 IMPLANT
PAD ARMBOARD 7.5X6 YLW CONV (MISCELLANEOUS) ×4 IMPLANT
SUT PROLENE 7 0 BV 1 (SUTURE) ×4 IMPLANT
SUT SILK 3 0 (SUTURE) ×1
SUT SILK 3-0 18XBRD TIE 12 (SUTURE) ×1 IMPLANT
SUT VIC AB 3-0 SH 27 (SUTURE) ×1
SUT VIC AB 3-0 SH 27X BRD (SUTURE) ×1 IMPLANT
SUT VICRYL 4-0 PS2 18IN ABS (SUTURE) ×2 IMPLANT
UNDERPAD 30X30 (UNDERPADS AND DIAPERS) ×2 IMPLANT
WATER STERILE IRR 1000ML POUR (IV SOLUTION) ×2 IMPLANT

## 2016-12-17 NOTE — Anesthesia Postprocedure Evaluation (Signed)
Anesthesia Post Note  Patient: Rodney Gordon  Procedure(s) Performed: Procedure(s) (LRB): ARTERIOVENOUS (AV) FISTULA CREATION- RIGHT BRACHIOCEPHALIC (Right)  Patient location during evaluation: PACU Anesthesia Type: General Level of consciousness: awake Pain management: pain level controlled Vital Signs Assessment: post-procedure vital signs reviewed and stable Cardiovascular status: stable Anesthetic complications: no       Last Vitals:  Vitals:   12/17/16 1100 12/17/16 1115  BP:  (!) 189/72  Pulse: (!) 46 (!) 50  Resp: 17 16  Temp:      Last Pain:  Vitals:   12/17/16 1115  TempSrc:   PainSc: 0-No pain                 Taiven Greenley

## 2016-12-17 NOTE — Anesthesia Postprocedure Evaluation (Signed)
Anesthesia Post Note  Patient: Rodney Gordon  Procedure(s) Performed: Procedure(s) (LRB): ARTERIOVENOUS (AV) FISTULA CREATION- RIGHT BRACHIOCEPHALIC (Right)  Patient location during evaluation: PACU Anesthesia Type: General Level of consciousness: awake Pain management: pain level controlled Vital Signs Assessment: post-procedure vital signs reviewed and stable Respiratory status: spontaneous breathing Cardiovascular status: stable Anesthetic complications: no       Last Vitals:  Vitals:   12/17/16 0804 12/17/16 1040  BP: (!) 214/69 (!) 148/61  Pulse: (!) 57 (!) 46  Resp: 20   Temp: 36.5 C 36.5 C    Last Pain:  Vitals:   12/17/16 1040  TempSrc:   PainSc: Asleep                 Lovey Crupi

## 2016-12-17 NOTE — Transfer of Care (Signed)
Immediate Anesthesia Transfer of Care Note  Patient: Rodney Gordon  Procedure(s) Performed: Procedure(s): ARTERIOVENOUS (AV) FISTULA CREATION- RIGHT BRACHIOCEPHALIC (Right)  Patient Location: PACU  Anesthesia Type:MAC  Level of Consciousness: sedated  Airway & Oxygen Therapy: Patient connected to face mask oxygen  Post-op Assessment: Post -op Vital signs reviewed and stable  Post vital signs: stable  Last Vitals:  Vitals:   12/17/16 0804 12/17/16 1040  BP: (!) 214/69 (!) (P) 148/61  Pulse: (!) 57 (!) (P) 46  Resp: 20   Temp: 36.5 C (P) 36.5 C    Last Pain:  Vitals:   12/17/16 0804  TempSrc: Oral      Patients Stated Pain Goal: 2 (06/00/45 9977)  Complications: No apparent anesthesia complications

## 2016-12-17 NOTE — Telephone Encounter (Signed)
-----   Message from Mena Goes, RN sent at 12/17/2016 11:52 AM EDT ----- Regarding: 4-6 weeks w/ duplex   ----- Message ----- From: Alvia Grove, PA-C Sent: 12/17/2016  10:28 AM To: [facility] Charge Pool  S/p right brachial cephalic AV fistula 3/91/22  F/u with Dr. Oneida Alar or NP in 4-6 weeks with duplex  Thanks Maudie Mercury

## 2016-12-17 NOTE — Interval H&P Note (Signed)
History and Physical Interval Note:  12/17/2016 7:26 AM  Rodney Gordon  has presented today for surgery, with the diagnosis of Chronic Kidney Disease Stage 5  The various methods of treatment have been discussed with the patient and family. After consideration of risks, benefits and other options for treatment, the patient has consented to  Procedure(s): ARTERIOVENOUS (AV) FISTULA CREATION- RIGHT BRACHIOCEPHALIC (Right) as a surgical intervention .  The patient's history has been reviewed, patient examined, no change in status, stable for surgery.  I have reviewed the patient's chart and labs.  Questions were answered to the patient's satisfaction.     Ruta Hinds

## 2016-12-17 NOTE — Op Note (Signed)
Procedure: Right Brachial Cephalic AV fistula  Preop: ESRD  Postop: ESRD  Anesthesia: Local with IV sedation  Assistant: Silva Bandy PA-C  Findings: 3.5 mm cephalic vein  Procedure: After obtaining informed consent, the patient was taken to the operating room.  After adequate sedation, the right upper extremity was prepped and draped in usual sterile fashion.  A transverse incision was then made near the antecubital crease the right arm. The incision was carried into the subcutaneous tissues down to level of the cephalic vein. The cephalic vein was approximately 3.5 mm in diameter. It was of good quality. There were dense adhesions around the artery and vein from prior needle punctures which made dissection fairly tedious.  The vein was dissected free circumferentially and small side branches ligated and divided between silk ties or clips. Next the brachial artery was dissected free in the medial portion of the incision. The artery was  3-4 mm in diameter. The vessel loops were placed proximal and distal to the planned site of arteriotomy. The patient was given 5000 units of intravenous heparin. After appropriate circulation time, the vessel loops were used to control the artery. A longitudinal opening was made in the brachial artery.  The vein was ligated distally with a 2-0 silk tie. The vein was controlled proximally with a fine bulldog clamp. The vein was then swung over to the artery and sewn end of vein to side of artery using a running 7-0 Prolene suture. Just prior to completion of the anastomosis, everything was fore bled back bled and thoroughly flushed. The anastomosis was secured, vessel loops released, and there was a palpable thrill in the fistula immediately. The patient was given 50 mg of protamine. After hemostasis was obtained, the subcutaneous tissues were reapproximated using a running 3-0 Vicryl suture. The skin was then closed with a 4 0 Vicryl subcuticular stitch. Dermabond was  applied to the skin incision.  The patient had a palpable radial pulse at the end of the case.  Ruta Hinds, MD [facility] Office: [phone] Pager: [phone]

## 2016-12-17 NOTE — H&P (View-Only) (Signed)
Referring Physician: Dr Mercy Moore  Patient name: Rodney Gordon MRN: [id number] DOB: 1956/06/20 Sex: male  REASON FOR CONSULT: CKD 5 hemodialysis access  HPI: [name] is a 61 y.o. male sent for evaluation for long-term hemodialysis access. The patient is not currently on dialysis. He is right handed. He currently does not work and is on disability from cardiac dysfunction. He states he did see Dr. Domenic Polite about 2 weeks ago. He has an echo scheduled for May 22. The patient become short of breath when walking on an incline or on short distances. He occasionally gets some chest pain.   Past Medical History:  Diagnosis Date  . Anemia of chronic disease   . Aortic insufficiency    Mild to moderate  . CKD (chronic kidney disease)    [name]  . Coronary atherosclerosis of native coronary artery    Nonobstructive at catheterization 2004  . Essential hypertension, benign   . Peripheral vascular disease [facility])    Past Surgical History:  Procedure Laterality Date  . TIBIA FRACTURE SURGERY     Left    Family History  Problem Relation Age of Onset  . Cancer Other   . Coronary artery disease Other     SOCIAL HISTORY: Social History   Social History  . Marital status: Single    Spouse name: N/A  . Number of children: N/A  . Years of education: N/A   Occupational History  . Not on file.   Social History Main Topics  . Smoking status: Former Smoker    Packs/day: 1.00    Years: 25.00    Types: Cigarettes    Start date: 07/07/1981    Quit date: 07/22/1998  . Smokeless tobacco: Never Used  . Alcohol use No  . Drug use: No  . Sexual activity: Not on file   Other Topics Concern  . Not on file   Social History Narrative  . No narrative on file    No Known Allergies  Current Outpatient Prescriptions  Medication Sig Dispense Refill  . acetaminophen (TYLENOL) 650 MG CR tablet Take 650 mg by mouth as needed for pain.    Marland Kitchen amLODipine (NORVASC) 10 MG tablet Take 1  tablet (10 mg total) by mouth daily. 30 tablet 6  . aspirin 81 MG tablet Take 81 mg by mouth daily. Reported on 11/10/2015    . atenolol (TENORMIN) 100 MG tablet Take 100 mg by mouth 2 (two) times daily.    . calcium acetate (PHOSLO) 667 MG capsule Take 667 mg by mouth 3 (three) times daily with meals.    . Calcium Carbonate-Vitamin D (CALTRATE 600+D PO) Take 1 capsule by mouth daily.    Marland Kitchen doxazosin (CARDURA) 8 MG tablet Take 8 mg by mouth daily.    Marland Kitchen epoetin alfa (EPOGEN,PROCRIT) 62035 UNIT/ML injection Inject 10,000 Units into the skin once a week.    . Ferrous Sulfate (IRON) 325 (65 Fe) MG TABS Take by mouth as needed. When he gets to feeling tired & sluggish.    . furosemide (LASIX) 80 MG tablet Take 80 mg by mouth 2 (two) times daily.    . hydrALAZINE (APRESOLINE) 25 MG tablet Take 1 tablet (25 mg total) by mouth 2 (two) times daily. 60 tablet 6  . ibuprofen (ADVIL,MOTRIN) 200 MG tablet Take 200 mg by mouth every 6 (six) hours as needed.    . Magnesium Citrate 100 MG TABS Take 1 tablet by mouth daily.    Marland Kitchen  mycophenolate (CELLCEPT) 250 MG capsule Take 250 mg by mouth 2 (two) times daily.    . nitroGLYCERIN (NITROSTAT) 0.4 MG SL tablet Place 1 tablet (0.4 mg total) under the tongue as directed. 25 tablet 2  . Omega-3 Fatty Acids (FISH OIL) 1000 MG CAPS Take 1 capsule by mouth daily.    . sodium bicarbonate 650 MG tablet Take 1,300 mg by mouth 3 (three) times daily.     . tamsulosin (FLOMAX) 0.4 MG CAPS Take 0.4 mg by mouth daily after supper.     No current facility-administered medications for this visit.     ROS:   General:  No weight loss, Fever, chills  HEENT: No recent headaches, no nasal bleeding, no visual changes, no sore throat  Neurologic: No dizziness, blackouts, seizures. No recent symptoms of stroke or mini- stroke. No recent episodes of slurred speech, or temporary blindness.  Cardiac: + recent episodes of chest pain/pressure, no shortness of breath at rest.  + shortness  of breath with exertion.  Denies history of atrial fibrillation or irregular heartbeat  Vascular: No history of rest pain in feet.  No history of claudication.  No history of non-healing ulcer, No history of DVT   Pulmonary: No home oxygen, no productive cough, no hemoptysis,  No asthma or wheezing  Musculoskeletal:  [ ]  Arthritis, [ ]  Low back pain,  [ ]  Joint pain  Hematologic:No history of hypercoagulable state.  No history of easy bleeding.  No history of anemia  Gastrointestinal: No hematochezia or melena,  No gastroesophageal reflux, no trouble swallowing  Urinary: [X]  chronic Kidney disease, [ ]  on HD - [ ]  MWF or [ ]  TTHS, [ ]  Burning with urination, [ ]  Frequent urination, [ ]  Difficulty urinating;   Skin: No rashes  Psychological: No history of anxiety,  No history of depression   Physical Examination  Vitals:   11/28/16 1415 11/28/16 1418  BP: (!) 194/74 (!) 192/73  Pulse: (!) 49   Resp: 20   Temp: (!) 96.7 F (35.9 C)   TempSrc: Oral   SpO2: 100%   Weight: 180 lb (81.6 kg)   Height: 5\' 10"  (1.778 m)     Body mass index is 25.83 kg/m.  General:  Alert and oriented, no acute distress HEENT: Normal Neck: No bruit or JVD Pulmonary: Clear to auscultation bilaterally Cardiac: Regular Rate and Rhythm without murmur Abdomen: Soft, non-tender, non-distended, no mass, no scars Skin: No rash Extremity Pulses:  2+ radial, brachial, femoral, dorsalis pedis, posterior tibial pulses bilaterally Musculoskeletal: No deformity or edema  Neurologic: Upper and lower extremity motor 5/5 and symmetric  DATA:  Had a vein mapping ultrasound today which shows the right cephalic vein is 3 mm in diameter in the upper arm. It is small in the forearm. The basilic vein is 3-4 mm in the right arm. The cephalic vein is diffusely small in the left arm. Basilic vein is 3 mm in the left arm. He also had arterial duplex exam which showed normal brachial artery anatomy.  ASSESSMENT:   Patient needs long-term hemodialysis access. Believe his best option for his initial access would be a right brachiocephalic AV fistula.   PLAN:  Right brachiocephalic AV fistula scheduled for 12/17/2016. Risks benefits possible, the patient's procedure details were explained to the patient today including but limited to bleeding infection non-maturation of the fistula ischemic steal. He understands and agrees to proceed. We will delay scheduling his fistula until after his echocardiogram.  Ruta Hinds, MD  [facility] [place] Office: [phone] Pager: [phone]

## 2016-12-17 NOTE — Telephone Encounter (Signed)
Sched appt 01/16/17; lab at 11:00 and NP at 11:45. Spoke to pt to confirm appt.

## 2016-12-17 NOTE — Anesthesia Preprocedure Evaluation (Addendum)
Anesthesia Evaluation  Patient identified by MRN, date of birth, ID band Patient awake    Reviewed: Allergy & Precautions, NPO status , Patient's Chart, lab work & pertinent test results  Airway Mallampati: II  TM Distance: >3 FB     Dental   Pulmonary former smoker,    breath sounds clear to auscultation       Cardiovascular hypertension, + CAD and + Peripheral Vascular Disease   Rhythm:Regular Rate:Normal     Neuro/Psych    GI/Hepatic negative GI ROS,   Endo/Other  negative endocrine ROS  Renal/GU Renal disease     Musculoskeletal   Abdominal   Peds  Hematology  (+) anemia ,   Anesthesia Other Findings   Reproductive/Obstetrics                             Anesthesia Physical Anesthesia Plan  ASA: III  Anesthesia Plan: MAC   Post-op Pain Management:    Induction: Intravenous  Airway Management Planned: Simple Face Mask  Additional Equipment:   Intra-op Plan:   Post-operative Plan:   Informed Consent: I have reviewed the patients History and Physical, chart, labs and discussed the procedure including the risks, benefits and alternatives for the proposed anesthesia with the patient or authorized representative who has indicated his/her understanding and acceptance.   Dental advisory given  Plan Discussed with: CRNA and Anesthesiologist  Anesthesia Plan Comments:        Anesthesia Quick Evaluation

## 2016-12-18 ENCOUNTER — Encounter (HOSPITAL_COMMUNITY): Payer: Self-pay | Admitting: Vascular Surgery

## 2016-12-19 NOTE — Addendum Note (Signed)
Addendum  created 12/19/16 1005 by Belinda Block, MD   Sign clinical note

## 2016-12-19 NOTE — Anesthesia Postprocedure Evaluation (Signed)
Anesthesia Post Note  Patient: Rodney Gordon  Procedure(s) Performed: Procedure(s) (LRB): ARTERIOVENOUS (AV) FISTULA CREATION- RIGHT BRACHIOCEPHALIC (Right)  Patient location during evaluation: PACU Anesthesia Type: MAC Pain management: pain level controlled Respiratory status: spontaneous breathing Cardiovascular status: stable Anesthetic complications: no       Last Vitals:  Vitals:   12/17/16 1100 12/17/16 1115  BP:  (!) 189/72  Pulse: (!) 46 (!) 50  Resp: 17 16  Temp:      Last Pain:  Vitals:   12/17/16 1115  TempSrc:   PainSc: 0-No pain                 [name]

## 2017-01-03 ENCOUNTER — Encounter: Payer: Self-pay | Admitting: Family

## 2017-01-16 ENCOUNTER — Encounter: Payer: Medicaid Other | Admitting: Family

## 2017-01-16 ENCOUNTER — Encounter (HOSPITAL_COMMUNITY): Payer: Medicaid Other

## 2017-01-20 ENCOUNTER — Encounter: Payer: Self-pay | Admitting: Family

## 2017-01-23 ENCOUNTER — Other Ambulatory Visit: Payer: Self-pay | Admitting: Vascular Surgery

## 2017-01-23 DIAGNOSIS — N185 Chronic kidney disease, stage 5: Secondary | ICD-10-CM

## 2017-01-27 ENCOUNTER — Ambulatory Visit (HOSPITAL_COMMUNITY)
Admission: RE | Admit: 2017-01-27 | Discharge: 2017-01-27 | Disposition: A | Discharge status: Home / Self Care | Payer: Medicaid Other | Source: Ambulatory Visit | Attending: Vascular Surgery | Admitting: Vascular Surgery

## 2017-01-27 ENCOUNTER — Encounter: Payer: Self-pay | Admitting: Family

## 2017-01-27 ENCOUNTER — Ambulatory Visit (INDEPENDENT_AMBULATORY_CARE_PROVIDER_SITE_OTHER): Payer: Self-pay | Admitting: Family

## 2017-01-27 VITALS — BP 162/70 | HR 46 | Temp 97.0°F | Resp 16 | Ht 70.0 in | Wt 186.0 lb

## 2017-01-27 DIAGNOSIS — N185 Chronic kidney disease, stage 5: Secondary | ICD-10-CM | POA: Insufficient documentation

## 2017-01-27 DIAGNOSIS — Z9889 Other specified postprocedural states: Secondary | ICD-10-CM | POA: Insufficient documentation

## 2017-01-27 DIAGNOSIS — I770 Arteriovenous fistula, acquired: Secondary | ICD-10-CM

## 2017-01-27 NOTE — Progress Notes (Signed)
    Postoperative Access Visit   History of Present Illness  Rodney Gordon is a 61 y.o. year old male who is s/p right brachial cephalic AV fistula creation on 12-17-16 by Dr. Oneida Alar.  He returns today for 4-6 weeks postoperative AV fistula duplex.  He currently does not work and is on disability from cardiac dysfunction.    He has not started hemodialysis yet, states his nephrologist would like to start him on HD as soon as his AV fistula is ready for use.  Pt is right hand dominant.   The patient's left antecubital incision is well healed.  The patient notes mild steal symptoms at the tip of his right thumb.  The patient is able to complete their activities of daily living.  The patient's current symptoms are: dependent edema at the ulnar aspect of his right elbow which he noticed for the first time today.   For VQI Use Only  PRE-ADM LIVING: Home  AMB STATUS: Ambulatory  Physical Examination Vitals:   01/27/17 1455  BP: (!) 162/70  Pulse: (!) 46  Resp: 16  Temp: (!) 97 F (36.1 C)  TempSrc: Oral  SpO2: 98%  Weight: 186 lb (84.4 kg)  Height: 5\' 10"  (1.778 m)   Body mass index is 26.69 kg/m.  Right antecubital ncision is  healed, skin feels warm and normal, hand grip is 5/5, sensation in digits is intact, palpable thrill, bruit can be auscultated.  Bilateral radial pulses are 2+ palpable.   Medical Decision Making  Rodney Gordon is a 61 y.o. year old male who presents s/p right brachial cephalic AV fistula creation on 12-17-16.  I spoke with Dr. Donnetta Hutching by phone, reviewed the access duplex results, pt HPI, physical exam results. Need to wait the full 3 months after access creation on 12-17-16 before using the AV fistula; if dialysis is needed prior to this then a temporary catheter may be inserted for use.   Follow up with [place] as needed.   Thank you for allowing [place] to participate in this patient's care.  [name], [name], RN, MSN, FNP-C [facility]  [place] Office: [phone]  01/27/2017, 3:19 PM  Clinic MD: Early on call

## 2017-04-21 ENCOUNTER — Other Ambulatory Visit (HOSPITAL_COMMUNITY): Payer: Self-pay | Admitting: Nephrology

## 2017-04-21 DIAGNOSIS — N186 End stage renal disease: Secondary | ICD-10-CM

## 2017-04-23 ENCOUNTER — Other Ambulatory Visit: Payer: Self-pay | Admitting: General Surgery

## 2017-04-24 ENCOUNTER — Ambulatory Visit (HOSPITAL_COMMUNITY)
Admission: RE | Admit: 2017-04-24 | Discharge: 2017-04-24 | Disposition: A | Discharge status: Home / Self Care | Payer: Medicaid Other | Source: Ambulatory Visit | Attending: Nephrology | Admitting: Nephrology

## 2017-04-24 ENCOUNTER — Encounter (HOSPITAL_COMMUNITY): Payer: Self-pay | Admitting: Interventional Radiology

## 2017-04-24 ENCOUNTER — Other Ambulatory Visit (HOSPITAL_COMMUNITY): Payer: Self-pay | Admitting: Nephrology

## 2017-04-24 DIAGNOSIS — N186 End stage renal disease: Secondary | ICD-10-CM | POA: Insufficient documentation

## 2017-04-24 DIAGNOSIS — M7989 Other specified soft tissue disorders: Secondary | ICD-10-CM | POA: Insufficient documentation

## 2017-04-24 HISTORY — PX: IR US GUIDE VASC ACCESS RIGHT: IMG2390

## 2017-04-24 HISTORY — PX: IR DIALY SHUNT INTRO NEEDLE/INTRACATH INITIAL W/IMG RIGHT: IMG6115

## 2017-04-24 MED ORDER — IOPAMIDOL (ISOVUE-300) INJECTION 61%
INTRAVENOUS | Status: AC
  Administered 2017-04-24: 36 mL
  Filled 2017-04-24: qty 100

## 2017-04-24 MED ORDER — LIDOCAINE HCL (PF) 1 % IJ SOLN
INTRAMUSCULAR | Status: DC | PRN
  Administered 2017-04-24: 5 mL

## 2017-04-24 MED ORDER — LIDOCAINE HCL 1 % IJ SOLN
INTRAMUSCULAR | Status: AC
  Filled 2017-04-24: qty 20

## 2017-04-24 NOTE — Procedures (Signed)
ESRD  S/p RUE av fistulogram  No comp Widely patent Full report in pacs

## 2017-05-09 DIAGNOSIS — N186 End stage renal disease: Secondary | ICD-10-CM | POA: Diagnosis not present

## 2017-05-09 DIAGNOSIS — L03113 Cellulitis of right upper limb: Secondary | ICD-10-CM | POA: Diagnosis not present

## 2017-05-09 DIAGNOSIS — D638 Anemia in other chronic diseases classified elsewhere: Secondary | ICD-10-CM | POA: Diagnosis not present

## 2017-05-09 DIAGNOSIS — Z992 Dependence on renal dialysis: Secondary | ICD-10-CM | POA: Diagnosis not present

## 2017-05-09 DIAGNOSIS — I10 Essential (primary) hypertension: Secondary | ICD-10-CM | POA: Diagnosis not present

## 2017-05-20 DIAGNOSIS — Z992 Dependence on renal dialysis: Secondary | ICD-10-CM | POA: Diagnosis not present

## 2017-05-20 DIAGNOSIS — N186 End stage renal disease: Secondary | ICD-10-CM | POA: Diagnosis not present

## 2017-05-22 ENCOUNTER — Ambulatory Visit: Payer: Medicaid Other | Admitting: Cardiology

## 2017-06-05 NOTE — Progress Notes (Signed)
Cardiology Office Note  Date: 06/06/2017   ID: [name], DOB 1955/10/27, MRN [id number]  PCP: Neale Burly, MD  Primary Cardiologist: Rozann Lesches, MD   Chief Complaint  Patient presents with  . Aortic regurgitation    History of Present Illness: Rodney Gordon is a 61 y.o. male last seen in May.  He presents for a routine follow-up visit.  He is now on hemodialysis per [name], has sessions on Tuesday, Thursday, and Saturday.  He states that he is tolerating this well.  He reports no chest pain and has chronic dyspnea on exertion.  Follow-up echocardiogram in May revealed LVEF 50-55% with grade 2 diastolic dysfunction, moderate aortic regurgitation, and PASP 56 mmHg.  I reviewed the results with him today.  I reviewed his medications.  Current regimen includes Norvasc, atenolol, Lasix, and hydralazine.  Past Medical History:  Diagnosis Date  . Anemia of chronic disease   . Aortic insufficiency    Mild to moderate  . Coronary atherosclerosis of native coronary artery    Nonobstructive at catheterization 2004  . ESRD on hemodialysis ([place])    [name]  . Essential hypertension, benign   . Peripheral vascular disease [facility])     Past Surgical History:  Procedure Laterality Date  . ARTERIOVENOUS (AV) FISTULA CREATION- RIGHT BRACHIOCEPHALIC Right 6/50/3546   Performed by Elam Dutch, MD at [place]  . CARDIAC CATHETERIZATION     at [facility]  . IR DIALY SHUNT INTRO NEEDLE/INTRACATH INITIAL W/IMG RIGHT Right 04/24/2017  . IR US GUIDE VASC ACCESS RIGHT  04/24/2017  . PROSTATE SURGERY    . TIBIA FRACTURE SURGERY     Left    Current Outpatient Medications  Medication Sig Dispense Refill  . acetaminophen (TYLENOL) 500 MG tablet Take 1,000 mg by mouth 2 (two) times daily as needed for mild pain.    Marland Kitchen amLODipine (NORVASC) 10 MG tablet Take 1 tablet (10 mg total) by mouth daily. 30 tablet 6  . aspirin 81 MG tablet Take 81 mg by mouth daily as needed for pain.  Reported on 11/10/2015    . atenolol (TENORMIN) 100 MG tablet Take 100 mg by mouth 2 (two) times daily.    . Calcium Carbonate-Vitamin D (CALTRATE 600+D PO) Take 1 tablet by mouth daily.     . diphenhydramine-acetaminophen (TYLENOL PM) 25-500 MG TABS tablet Take 1 tablet by mouth 2 (two) times daily as needed (PAIN).    Marland Kitchen doxazosin (CARDURA) 8 MG tablet Take 8 mg by mouth daily.    Marland Kitchen epoetin alfa (EPOGEN,PROCRIT) 56812 UNIT/ML injection Inject 10,000 Units into the skin once a week. Friday    . Ferrous Sulfate (IRON) 325 (65 Fe) MG TABS Take 325 mg by mouth daily as needed. When he gets to feeling tired & sluggish.     . furosemide (LASIX) 80 MG tablet Take 80 mg by mouth 2 (two) times daily.    . hydrALAZINE (APRESOLINE) 25 MG tablet Take 1 tablet (25 mg total) by mouth 2 (two) times daily. (Patient taking differently: Take 25 mg by mouth daily. ) 60 tablet 6  . ibuprofen (ADVIL,MOTRIN) 200 MG tablet Take 200 mg by mouth daily as needed for moderate pain.     . Magnesium Citrate 100 MG TABS Take 1 tablet by mouth daily.    . nitroGLYCERIN (NITROSTAT) 0.4 MG SL tablet Place 1 tablet (0.4 mg total) under the tongue as directed. 25 tablet 2  . Omega-3 Fatty Acids (  FISH OIL) 500 MG CAPS Take 500 mg by mouth 2 (two) times daily.    Marland Kitchen oxyCODONE-acetaminophen (ROXICET) 5-325 MG tablet Take 1-2 tablets by mouth every 6 (six) hours as needed. 6 tablet 0  . sodium bicarbonate 650 MG tablet Take 1,300 mg by mouth 2 (two) times daily.      No current facility-administered medications for this visit.    Allergies:  Patient has no known allergies.   Social History: The patient  reports that he quit smoking about 18 years ago. His smoking use included cigarettes. He started smoking about 35 years ago. He has a 25.00 pack-year smoking history. he has never used smokeless tobacco. He reports that he does not drink alcohol or use drugs.   ROS:  Please see the history of present illness. Otherwise, complete  review of systems is positive for fatigue.  All other systems are reviewed and negative.   Physical Exam: VS:  BP (!) 158/78   Pulse (!) 59   Ht 6\' 1"  (1.854 m)   Wt 157 lb (71.2 kg)   SpO2 98%   BMI 20.71 kg/m , BMI Body mass index is 20.71 kg/m.  Wt Readings from Last 3 Encounters:  06/06/17 157 lb (71.2 kg)  01/27/17 186 lb (84.4 kg)  11/28/16 180 lb (81.6 kg)    General: Patient appears comfortable at rest. HEENT: Conjunctiva and lids normal, oropharynx clear. Neck: Supple, no elevated JVP or carotid bruits, no thyromegaly. Lungs: Clear to auscultation, nonlabored breathing at rest. Cardiac: Regular rate and rhythm, no S3, 9-5/6 systolic murmur and soft diastolic murmur, no pericardial rub. Abdomen: Soft, nontender, bowel sounds present, no guarding or rebound. Extremities: No pitting edema, distal pulses 2+.  Left arm AV fistula in place. Skin: Warm and dry. Musculoskeletal: No kyphosis. Neuropsychiatric: Alert and oriented x3, affect grossly appropriate.  ECG: I personally reviewed the tracing from 10/21/2016 which showed sinus rhythm with left atrial enlargement, increased voltage and repolarization abnormalities.  Recent Labwork: 12/17/2016: Hemoglobin 9.2; Potassium 4.4; Sodium 144   Other Studies Reviewed Today:  Echocardiogram 12/10/2016: Study Conclusions  - Left ventricle: The cavity size was mildly dilated. Wall   thickness was increased in a pattern of mild LVH. Systolic   function was normal. The estimated ejection fraction was in the   range of 50% to 55%. Diffuse hypokinesis. Features are consistent   with a pseudonormal left ventricular filling pattern, with   concomitant abnormal relaxation and increased filling pressure   (grade 2 diastolic dysfunction). - Aortic valve: Moderately calcified annulus. Trileaflet; mildly   thickened leaflets. There was moderate regurgitation. - Mitral valve: Mildly calcified annulus. There was mild   regurgitation. -  Left atrium: The atrium was severely dilated. - Right atrium: Central venous pressure (est): 8 mm Hg. - Tricuspid valve: There was mild regurgitation. - Pulmonary arteries: PA peak pressure: 56 mm Hg (S). - Pericardium, extracardiac: A small pericardial effusion was   identified circumferential to the heart.  Impressions:  - Mild LVH with mild LV chamber dilatation and LVEF 50-55%. There   is diffuse hypokinesis. Grade 2 diastolic dysfunction with   increased LV filling pressure. Severe left atrial enlargement.   Mildly calcified mitral annulus with mild mitral regurgitation.   Mildly thickened aortic leaflets with calcified annulus and   overall moderate aortic regurgitation. Mild tricuspid   regurgitation with PASP estimated 56 mmHg. Small circumferential   pericardial effusion.  Assessment and Plan:  1.  Moderate aortic regurgitation, symptomatically stable.  Follow-up echo cardia Phillip Heal will be obtained for next May.  2.  Essential hypertension.  Continues on multimodal therapy, keep follow-up with Dr. Lew Dawes.  3.  End-stage renal disease, now on hemodialysis.  Follows with [name].  4.  Anemia of chronic disease.  He is on iron supplements.  Current medicines were reviewed with the patient today.   Orders Placed This Encounter  Procedures  . ECHOCARDIOGRAM COMPLETE    Disposition: Follow-up after echocardiogram.  Signed, Satira Sark, MD, [facility] 06/06/2017 11:32 AM    [facility] [place]. [address], [place], [place] [address] Phone: [phone]; Fax: [phone]

## 2017-06-06 ENCOUNTER — Ambulatory Visit (INDEPENDENT_AMBULATORY_CARE_PROVIDER_SITE_OTHER): Payer: Medicaid Other | Admitting: Cardiology

## 2017-06-06 ENCOUNTER — Encounter: Payer: Self-pay | Admitting: Cardiology

## 2017-06-06 VITALS — BP 158/78 | HR 59 | Ht 73.0 in | Wt 157.0 lb

## 2017-06-06 DIAGNOSIS — I10 Essential (primary) hypertension: Secondary | ICD-10-CM

## 2017-06-06 DIAGNOSIS — N186 End stage renal disease: Secondary | ICD-10-CM | POA: Diagnosis not present

## 2017-06-06 DIAGNOSIS — D638 Anemia in other chronic diseases classified elsewhere: Secondary | ICD-10-CM | POA: Diagnosis not present

## 2017-06-06 DIAGNOSIS — I351 Nonrheumatic aortic (valve) insufficiency: Secondary | ICD-10-CM | POA: Diagnosis not present

## 2017-06-06 NOTE — Patient Instructions (Signed)
Medication Instructions:  Continue all current medications.  Labwork: none  Testing/Procedures: Your physician has requested that you have an echocardiogram. Echocardiography is a painless test that uses sound waves to create images of your heart. It provides your doctor with information about the size and shape of your heart and how well your heart's chambers and valves are working. This procedure takes approximately one hour. There are no restrictions for this procedure.  (DUE IN MAY)  Follow-Up: Your physician wants you to follow up in: 6 months.  You will receive a reminder letter in the mail one-two months in advance.  If you don't receive a letter, please call our office to schedule the follow up appointment   Any Other Special Instructions Will Be Listed Below (If Applicable).  If you need a refill on your cardiac medications before your next appointment, please call your pharmacy.

## 2017-06-20 DIAGNOSIS — N186 End stage renal disease: Secondary | ICD-10-CM | POA: Diagnosis not present

## 2017-06-20 DIAGNOSIS — Z992 Dependence on renal dialysis: Secondary | ICD-10-CM | POA: Diagnosis not present

## 2017-06-21 DIAGNOSIS — Z23 Encounter for immunization: Secondary | ICD-10-CM | POA: Diagnosis not present

## 2017-06-21 DIAGNOSIS — N2581 Secondary hyperparathyroidism of renal origin: Secondary | ICD-10-CM | POA: Diagnosis not present

## 2017-06-21 DIAGNOSIS — D509 Iron deficiency anemia, unspecified: Secondary | ICD-10-CM | POA: Diagnosis not present

## 2017-06-21 DIAGNOSIS — D631 Anemia in chronic kidney disease: Secondary | ICD-10-CM | POA: Diagnosis not present

## 2017-06-21 DIAGNOSIS — N186 End stage renal disease: Secondary | ICD-10-CM | POA: Diagnosis not present

## 2017-06-21 DIAGNOSIS — Z992 Dependence on renal dialysis: Secondary | ICD-10-CM | POA: Diagnosis not present

## 2017-06-24 DIAGNOSIS — N2581 Secondary hyperparathyroidism of renal origin: Secondary | ICD-10-CM | POA: Diagnosis not present

## 2017-06-24 DIAGNOSIS — Z23 Encounter for immunization: Secondary | ICD-10-CM | POA: Diagnosis not present

## 2017-06-24 DIAGNOSIS — Z992 Dependence on renal dialysis: Secondary | ICD-10-CM | POA: Diagnosis not present

## 2017-06-24 DIAGNOSIS — D509 Iron deficiency anemia, unspecified: Secondary | ICD-10-CM | POA: Diagnosis not present

## 2017-06-24 DIAGNOSIS — N186 End stage renal disease: Secondary | ICD-10-CM | POA: Diagnosis not present

## 2017-06-24 DIAGNOSIS — D631 Anemia in chronic kidney disease: Secondary | ICD-10-CM | POA: Diagnosis not present

## 2017-06-26 DIAGNOSIS — Z992 Dependence on renal dialysis: Secondary | ICD-10-CM | POA: Diagnosis not present

## 2017-06-26 DIAGNOSIS — D509 Iron deficiency anemia, unspecified: Secondary | ICD-10-CM | POA: Diagnosis not present

## 2017-06-26 DIAGNOSIS — N2581 Secondary hyperparathyroidism of renal origin: Secondary | ICD-10-CM | POA: Diagnosis not present

## 2017-06-26 DIAGNOSIS — Z23 Encounter for immunization: Secondary | ICD-10-CM | POA: Diagnosis not present

## 2017-06-26 DIAGNOSIS — D631 Anemia in chronic kidney disease: Secondary | ICD-10-CM | POA: Diagnosis not present

## 2017-06-26 DIAGNOSIS — N186 End stage renal disease: Secondary | ICD-10-CM | POA: Diagnosis not present

## 2017-06-28 DIAGNOSIS — Z23 Encounter for immunization: Secondary | ICD-10-CM | POA: Diagnosis not present

## 2017-06-28 DIAGNOSIS — N2581 Secondary hyperparathyroidism of renal origin: Secondary | ICD-10-CM | POA: Diagnosis not present

## 2017-06-28 DIAGNOSIS — D509 Iron deficiency anemia, unspecified: Secondary | ICD-10-CM | POA: Diagnosis not present

## 2017-06-28 DIAGNOSIS — Z992 Dependence on renal dialysis: Secondary | ICD-10-CM | POA: Diagnosis not present

## 2017-06-28 DIAGNOSIS — N186 End stage renal disease: Secondary | ICD-10-CM | POA: Diagnosis not present

## 2017-06-28 DIAGNOSIS — D631 Anemia in chronic kidney disease: Secondary | ICD-10-CM | POA: Diagnosis not present

## 2017-07-01 DIAGNOSIS — N2581 Secondary hyperparathyroidism of renal origin: Secondary | ICD-10-CM | POA: Diagnosis not present

## 2017-07-01 DIAGNOSIS — D509 Iron deficiency anemia, unspecified: Secondary | ICD-10-CM | POA: Diagnosis not present

## 2017-07-01 DIAGNOSIS — N186 End stage renal disease: Secondary | ICD-10-CM | POA: Diagnosis not present

## 2017-07-01 DIAGNOSIS — D631 Anemia in chronic kidney disease: Secondary | ICD-10-CM | POA: Diagnosis not present

## 2017-07-01 DIAGNOSIS — Z23 Encounter for immunization: Secondary | ICD-10-CM | POA: Diagnosis not present

## 2017-07-01 DIAGNOSIS — Z992 Dependence on renal dialysis: Secondary | ICD-10-CM | POA: Diagnosis not present

## 2017-07-03 DIAGNOSIS — D631 Anemia in chronic kidney disease: Secondary | ICD-10-CM | POA: Diagnosis not present

## 2017-07-03 DIAGNOSIS — N2581 Secondary hyperparathyroidism of renal origin: Secondary | ICD-10-CM | POA: Diagnosis not present

## 2017-07-03 DIAGNOSIS — Z23 Encounter for immunization: Secondary | ICD-10-CM | POA: Diagnosis not present

## 2017-07-03 DIAGNOSIS — N186 End stage renal disease: Secondary | ICD-10-CM | POA: Diagnosis not present

## 2017-07-03 DIAGNOSIS — D509 Iron deficiency anemia, unspecified: Secondary | ICD-10-CM | POA: Diagnosis not present

## 2017-07-03 DIAGNOSIS — Z992 Dependence on renal dialysis: Secondary | ICD-10-CM | POA: Diagnosis not present

## 2017-07-04 ENCOUNTER — Ambulatory Visit: Payer: Medicaid Other | Admitting: Cardiology

## 2017-07-05 DIAGNOSIS — Z23 Encounter for immunization: Secondary | ICD-10-CM | POA: Diagnosis not present

## 2017-07-05 DIAGNOSIS — D631 Anemia in chronic kidney disease: Secondary | ICD-10-CM | POA: Diagnosis not present

## 2017-07-05 DIAGNOSIS — N186 End stage renal disease: Secondary | ICD-10-CM | POA: Diagnosis not present

## 2017-07-05 DIAGNOSIS — Z992 Dependence on renal dialysis: Secondary | ICD-10-CM | POA: Diagnosis not present

## 2017-07-05 DIAGNOSIS — D509 Iron deficiency anemia, unspecified: Secondary | ICD-10-CM | POA: Diagnosis not present

## 2017-07-05 DIAGNOSIS — N2581 Secondary hyperparathyroidism of renal origin: Secondary | ICD-10-CM | POA: Diagnosis not present

## 2017-07-08 DIAGNOSIS — D509 Iron deficiency anemia, unspecified: Secondary | ICD-10-CM | POA: Diagnosis not present

## 2017-07-08 DIAGNOSIS — D631 Anemia in chronic kidney disease: Secondary | ICD-10-CM | POA: Diagnosis not present

## 2017-07-08 DIAGNOSIS — Z23 Encounter for immunization: Secondary | ICD-10-CM | POA: Diagnosis not present

## 2017-07-08 DIAGNOSIS — N186 End stage renal disease: Secondary | ICD-10-CM | POA: Diagnosis not present

## 2017-07-08 DIAGNOSIS — N2581 Secondary hyperparathyroidism of renal origin: Secondary | ICD-10-CM | POA: Diagnosis not present

## 2017-07-08 DIAGNOSIS — Z992 Dependence on renal dialysis: Secondary | ICD-10-CM | POA: Diagnosis not present

## 2017-07-10 DIAGNOSIS — D509 Iron deficiency anemia, unspecified: Secondary | ICD-10-CM | POA: Diagnosis not present

## 2017-07-10 DIAGNOSIS — N186 End stage renal disease: Secondary | ICD-10-CM | POA: Diagnosis not present

## 2017-07-10 DIAGNOSIS — N2581 Secondary hyperparathyroidism of renal origin: Secondary | ICD-10-CM | POA: Diagnosis not present

## 2017-07-10 DIAGNOSIS — D631 Anemia in chronic kidney disease: Secondary | ICD-10-CM | POA: Diagnosis not present

## 2017-07-10 DIAGNOSIS — Z23 Encounter for immunization: Secondary | ICD-10-CM | POA: Diagnosis not present

## 2017-07-10 DIAGNOSIS — Z992 Dependence on renal dialysis: Secondary | ICD-10-CM | POA: Diagnosis not present

## 2017-07-12 DIAGNOSIS — Z23 Encounter for immunization: Secondary | ICD-10-CM | POA: Diagnosis not present

## 2017-07-12 DIAGNOSIS — D509 Iron deficiency anemia, unspecified: Secondary | ICD-10-CM | POA: Diagnosis not present

## 2017-07-12 DIAGNOSIS — Z992 Dependence on renal dialysis: Secondary | ICD-10-CM | POA: Diagnosis not present

## 2017-07-12 DIAGNOSIS — N2581 Secondary hyperparathyroidism of renal origin: Secondary | ICD-10-CM | POA: Diagnosis not present

## 2017-07-12 DIAGNOSIS — D631 Anemia in chronic kidney disease: Secondary | ICD-10-CM | POA: Diagnosis not present

## 2017-07-12 DIAGNOSIS — N186 End stage renal disease: Secondary | ICD-10-CM | POA: Diagnosis not present

## 2017-07-14 DIAGNOSIS — D631 Anemia in chronic kidney disease: Secondary | ICD-10-CM | POA: Diagnosis not present

## 2017-07-14 DIAGNOSIS — D509 Iron deficiency anemia, unspecified: Secondary | ICD-10-CM | POA: Diagnosis not present

## 2017-07-14 DIAGNOSIS — Z992 Dependence on renal dialysis: Secondary | ICD-10-CM | POA: Diagnosis not present

## 2017-07-14 DIAGNOSIS — Z23 Encounter for immunization: Secondary | ICD-10-CM | POA: Diagnosis not present

## 2017-07-14 DIAGNOSIS — N186 End stage renal disease: Secondary | ICD-10-CM | POA: Diagnosis not present

## 2017-07-14 DIAGNOSIS — N2581 Secondary hyperparathyroidism of renal origin: Secondary | ICD-10-CM | POA: Diagnosis not present

## 2017-07-17 DIAGNOSIS — Z23 Encounter for immunization: Secondary | ICD-10-CM | POA: Diagnosis not present

## 2017-07-17 DIAGNOSIS — N2581 Secondary hyperparathyroidism of renal origin: Secondary | ICD-10-CM | POA: Diagnosis not present

## 2017-07-17 DIAGNOSIS — Z992 Dependence on renal dialysis: Secondary | ICD-10-CM | POA: Diagnosis not present

## 2017-07-17 DIAGNOSIS — D631 Anemia in chronic kidney disease: Secondary | ICD-10-CM | POA: Diagnosis not present

## 2017-07-17 DIAGNOSIS — N186 End stage renal disease: Secondary | ICD-10-CM | POA: Diagnosis not present

## 2017-07-17 DIAGNOSIS — D509 Iron deficiency anemia, unspecified: Secondary | ICD-10-CM | POA: Diagnosis not present

## 2017-07-19 DIAGNOSIS — Z23 Encounter for immunization: Secondary | ICD-10-CM | POA: Diagnosis not present

## 2017-07-19 DIAGNOSIS — N2581 Secondary hyperparathyroidism of renal origin: Secondary | ICD-10-CM | POA: Diagnosis not present

## 2017-07-19 DIAGNOSIS — D509 Iron deficiency anemia, unspecified: Secondary | ICD-10-CM | POA: Diagnosis not present

## 2017-07-19 DIAGNOSIS — D631 Anemia in chronic kidney disease: Secondary | ICD-10-CM | POA: Diagnosis not present

## 2017-07-19 DIAGNOSIS — N186 End stage renal disease: Secondary | ICD-10-CM | POA: Diagnosis not present

## 2017-07-19 DIAGNOSIS — Z992 Dependence on renal dialysis: Secondary | ICD-10-CM | POA: Diagnosis not present

## 2017-07-21 DIAGNOSIS — Z992 Dependence on renal dialysis: Secondary | ICD-10-CM | POA: Diagnosis not present

## 2017-07-21 DIAGNOSIS — D631 Anemia in chronic kidney disease: Secondary | ICD-10-CM | POA: Diagnosis not present

## 2017-07-21 DIAGNOSIS — N2581 Secondary hyperparathyroidism of renal origin: Secondary | ICD-10-CM | POA: Diagnosis not present

## 2017-07-21 DIAGNOSIS — Z23 Encounter for immunization: Secondary | ICD-10-CM | POA: Diagnosis not present

## 2017-07-21 DIAGNOSIS — D509 Iron deficiency anemia, unspecified: Secondary | ICD-10-CM | POA: Diagnosis not present

## 2017-07-21 DIAGNOSIS — N186 End stage renal disease: Secondary | ICD-10-CM | POA: Diagnosis not present

## 2017-07-24 DIAGNOSIS — Z992 Dependence on renal dialysis: Secondary | ICD-10-CM | POA: Diagnosis not present

## 2017-07-24 DIAGNOSIS — D631 Anemia in chronic kidney disease: Secondary | ICD-10-CM | POA: Diagnosis not present

## 2017-07-24 DIAGNOSIS — Z23 Encounter for immunization: Secondary | ICD-10-CM | POA: Diagnosis not present

## 2017-07-24 DIAGNOSIS — D509 Iron deficiency anemia, unspecified: Secondary | ICD-10-CM | POA: Diagnosis not present

## 2017-07-24 DIAGNOSIS — N186 End stage renal disease: Secondary | ICD-10-CM | POA: Diagnosis not present

## 2017-07-24 DIAGNOSIS — N2581 Secondary hyperparathyroidism of renal origin: Secondary | ICD-10-CM | POA: Diagnosis not present

## 2017-07-26 DIAGNOSIS — N186 End stage renal disease: Secondary | ICD-10-CM | POA: Diagnosis not present

## 2017-07-26 DIAGNOSIS — Z23 Encounter for immunization: Secondary | ICD-10-CM | POA: Diagnosis not present

## 2017-07-26 DIAGNOSIS — N2581 Secondary hyperparathyroidism of renal origin: Secondary | ICD-10-CM | POA: Diagnosis not present

## 2017-07-26 DIAGNOSIS — D509 Iron deficiency anemia, unspecified: Secondary | ICD-10-CM | POA: Diagnosis not present

## 2017-07-26 DIAGNOSIS — Z992 Dependence on renal dialysis: Secondary | ICD-10-CM | POA: Diagnosis not present

## 2017-07-26 DIAGNOSIS — D631 Anemia in chronic kidney disease: Secondary | ICD-10-CM | POA: Diagnosis not present

## 2017-07-29 DIAGNOSIS — Z23 Encounter for immunization: Secondary | ICD-10-CM | POA: Diagnosis not present

## 2017-07-29 DIAGNOSIS — N186 End stage renal disease: Secondary | ICD-10-CM | POA: Diagnosis not present

## 2017-07-29 DIAGNOSIS — N2581 Secondary hyperparathyroidism of renal origin: Secondary | ICD-10-CM | POA: Diagnosis not present

## 2017-07-29 DIAGNOSIS — Z992 Dependence on renal dialysis: Secondary | ICD-10-CM | POA: Diagnosis not present

## 2017-07-29 DIAGNOSIS — D631 Anemia in chronic kidney disease: Secondary | ICD-10-CM | POA: Diagnosis not present

## 2017-07-29 DIAGNOSIS — D509 Iron deficiency anemia, unspecified: Secondary | ICD-10-CM | POA: Diagnosis not present

## 2017-07-31 DIAGNOSIS — N2581 Secondary hyperparathyroidism of renal origin: Secondary | ICD-10-CM | POA: Diagnosis not present

## 2017-07-31 DIAGNOSIS — D509 Iron deficiency anemia, unspecified: Secondary | ICD-10-CM | POA: Diagnosis not present

## 2017-07-31 DIAGNOSIS — N186 End stage renal disease: Secondary | ICD-10-CM | POA: Diagnosis not present

## 2017-07-31 DIAGNOSIS — Z992 Dependence on renal dialysis: Secondary | ICD-10-CM | POA: Diagnosis not present

## 2017-07-31 DIAGNOSIS — D631 Anemia in chronic kidney disease: Secondary | ICD-10-CM | POA: Diagnosis not present

## 2017-07-31 DIAGNOSIS — Z23 Encounter for immunization: Secondary | ICD-10-CM | POA: Diagnosis not present

## 2017-08-02 DIAGNOSIS — Z992 Dependence on renal dialysis: Secondary | ICD-10-CM | POA: Diagnosis not present

## 2017-08-02 DIAGNOSIS — N186 End stage renal disease: Secondary | ICD-10-CM | POA: Diagnosis not present

## 2017-08-02 DIAGNOSIS — D631 Anemia in chronic kidney disease: Secondary | ICD-10-CM | POA: Diagnosis not present

## 2017-08-02 DIAGNOSIS — N2581 Secondary hyperparathyroidism of renal origin: Secondary | ICD-10-CM | POA: Diagnosis not present

## 2017-08-02 DIAGNOSIS — D509 Iron deficiency anemia, unspecified: Secondary | ICD-10-CM | POA: Diagnosis not present

## 2017-08-02 DIAGNOSIS — Z23 Encounter for immunization: Secondary | ICD-10-CM | POA: Diagnosis not present

## 2017-08-05 DIAGNOSIS — Z23 Encounter for immunization: Secondary | ICD-10-CM | POA: Diagnosis not present

## 2017-08-05 DIAGNOSIS — D631 Anemia in chronic kidney disease: Secondary | ICD-10-CM | POA: Diagnosis not present

## 2017-08-05 DIAGNOSIS — N2581 Secondary hyperparathyroidism of renal origin: Secondary | ICD-10-CM | POA: Diagnosis not present

## 2017-08-05 DIAGNOSIS — N186 End stage renal disease: Secondary | ICD-10-CM | POA: Diagnosis not present

## 2017-08-05 DIAGNOSIS — D509 Iron deficiency anemia, unspecified: Secondary | ICD-10-CM | POA: Diagnosis not present

## 2017-08-05 DIAGNOSIS — Z992 Dependence on renal dialysis: Secondary | ICD-10-CM | POA: Diagnosis not present

## 2017-08-07 DIAGNOSIS — D631 Anemia in chronic kidney disease: Secondary | ICD-10-CM | POA: Diagnosis not present

## 2017-08-07 DIAGNOSIS — D509 Iron deficiency anemia, unspecified: Secondary | ICD-10-CM | POA: Diagnosis not present

## 2017-08-07 DIAGNOSIS — Z992 Dependence on renal dialysis: Secondary | ICD-10-CM | POA: Diagnosis not present

## 2017-08-07 DIAGNOSIS — Z23 Encounter for immunization: Secondary | ICD-10-CM | POA: Diagnosis not present

## 2017-08-07 DIAGNOSIS — N186 End stage renal disease: Secondary | ICD-10-CM | POA: Diagnosis not present

## 2017-08-07 DIAGNOSIS — N2581 Secondary hyperparathyroidism of renal origin: Secondary | ICD-10-CM | POA: Diagnosis not present

## 2017-08-09 DIAGNOSIS — N186 End stage renal disease: Secondary | ICD-10-CM | POA: Diagnosis not present

## 2017-08-09 DIAGNOSIS — D631 Anemia in chronic kidney disease: Secondary | ICD-10-CM | POA: Diagnosis not present

## 2017-08-09 DIAGNOSIS — D509 Iron deficiency anemia, unspecified: Secondary | ICD-10-CM | POA: Diagnosis not present

## 2017-08-09 DIAGNOSIS — N2581 Secondary hyperparathyroidism of renal origin: Secondary | ICD-10-CM | POA: Diagnosis not present

## 2017-08-09 DIAGNOSIS — Z23 Encounter for immunization: Secondary | ICD-10-CM | POA: Diagnosis not present

## 2017-08-09 DIAGNOSIS — Z992 Dependence on renal dialysis: Secondary | ICD-10-CM | POA: Diagnosis not present

## 2017-08-12 DIAGNOSIS — Z992 Dependence on renal dialysis: Secondary | ICD-10-CM | POA: Diagnosis not present

## 2017-08-12 DIAGNOSIS — Z23 Encounter for immunization: Secondary | ICD-10-CM | POA: Diagnosis not present

## 2017-08-12 DIAGNOSIS — D631 Anemia in chronic kidney disease: Secondary | ICD-10-CM | POA: Diagnosis not present

## 2017-08-12 DIAGNOSIS — N186 End stage renal disease: Secondary | ICD-10-CM | POA: Diagnosis not present

## 2017-08-12 DIAGNOSIS — D509 Iron deficiency anemia, unspecified: Secondary | ICD-10-CM | POA: Diagnosis not present

## 2017-08-12 DIAGNOSIS — N2581 Secondary hyperparathyroidism of renal origin: Secondary | ICD-10-CM | POA: Diagnosis not present

## 2017-08-14 DIAGNOSIS — D631 Anemia in chronic kidney disease: Secondary | ICD-10-CM | POA: Diagnosis not present

## 2017-08-14 DIAGNOSIS — N186 End stage renal disease: Secondary | ICD-10-CM | POA: Diagnosis not present

## 2017-08-14 DIAGNOSIS — D509 Iron deficiency anemia, unspecified: Secondary | ICD-10-CM | POA: Diagnosis not present

## 2017-08-14 DIAGNOSIS — Z992 Dependence on renal dialysis: Secondary | ICD-10-CM | POA: Diagnosis not present

## 2017-08-14 DIAGNOSIS — Z23 Encounter for immunization: Secondary | ICD-10-CM | POA: Diagnosis not present

## 2017-08-14 DIAGNOSIS — N2581 Secondary hyperparathyroidism of renal origin: Secondary | ICD-10-CM | POA: Diagnosis not present

## 2017-08-16 DIAGNOSIS — D631 Anemia in chronic kidney disease: Secondary | ICD-10-CM | POA: Diagnosis not present

## 2017-08-16 DIAGNOSIS — N2581 Secondary hyperparathyroidism of renal origin: Secondary | ICD-10-CM | POA: Diagnosis not present

## 2017-08-16 DIAGNOSIS — Z992 Dependence on renal dialysis: Secondary | ICD-10-CM | POA: Diagnosis not present

## 2017-08-16 DIAGNOSIS — N186 End stage renal disease: Secondary | ICD-10-CM | POA: Diagnosis not present

## 2017-08-16 DIAGNOSIS — Z23 Encounter for immunization: Secondary | ICD-10-CM | POA: Diagnosis not present

## 2017-08-16 DIAGNOSIS — D509 Iron deficiency anemia, unspecified: Secondary | ICD-10-CM | POA: Diagnosis not present

## 2017-08-19 DIAGNOSIS — Z992 Dependence on renal dialysis: Secondary | ICD-10-CM | POA: Diagnosis not present

## 2017-08-19 DIAGNOSIS — D631 Anemia in chronic kidney disease: Secondary | ICD-10-CM | POA: Diagnosis not present

## 2017-08-19 DIAGNOSIS — D509 Iron deficiency anemia, unspecified: Secondary | ICD-10-CM | POA: Diagnosis not present

## 2017-08-19 DIAGNOSIS — Z23 Encounter for immunization: Secondary | ICD-10-CM | POA: Diagnosis not present

## 2017-08-19 DIAGNOSIS — N2581 Secondary hyperparathyroidism of renal origin: Secondary | ICD-10-CM | POA: Diagnosis not present

## 2017-08-19 DIAGNOSIS — N186 End stage renal disease: Secondary | ICD-10-CM | POA: Diagnosis not present

## 2017-08-20 ENCOUNTER — Encounter: Payer: Self-pay | Admitting: Vascular Surgery

## 2017-08-20 ENCOUNTER — Ambulatory Visit (INDEPENDENT_AMBULATORY_CARE_PROVIDER_SITE_OTHER): Payer: Medicare Other | Admitting: Vascular Surgery

## 2017-08-20 ENCOUNTER — Encounter: Payer: Self-pay | Admitting: *Deleted

## 2017-08-20 ENCOUNTER — Other Ambulatory Visit: Payer: Self-pay | Admitting: *Deleted

## 2017-08-20 VITALS — BP 192/68 | HR 57 | Temp 97.1°F | Resp 16 | Ht 73.0 in | Wt 168.0 lb

## 2017-08-20 DIAGNOSIS — Z992 Dependence on renal dialysis: Secondary | ICD-10-CM | POA: Diagnosis not present

## 2017-08-20 DIAGNOSIS — N186 End stage renal disease: Secondary | ICD-10-CM

## 2017-08-20 NOTE — Progress Notes (Signed)
Patient name: Rodney Gordon MRN: [id number] DOB: [birth year] Sex: male  REASON FOR VISIT:   "Whistle" in AV fistula.  The consult is requested by [name].  HPI:   Rodney Gordon is a pleasant 62 y.o. male who had a right brachiocephalic fistula placed on 12/17/2016.  He was noted to have a "whistle" in his fistula and was sent for further evaluation.  He tells me that the fistula is working well.  He denies any pain or paresthesias in his right arm.  He dialyzes on Tuesdays Thursdays and Saturdays.  He denies any recent uremic symptoms.  Specifically, he denies nausea, vomiting, fatigue, anorexia, or palpitations.  Past Medical History:  Diagnosis Date  . Anemia of chronic disease   . Aortic insufficiency    Mild to moderate  . Coronary atherosclerosis of native coronary artery    Nonobstructive at catheterization 2004  . ESRD on hemodialysis ([place])    [name]  . Essential hypertension, benign   . Peripheral vascular disease ([place])     Family History  Problem Relation Age of Onset  . Cancer Other   . Coronary artery disease Other     SOCIAL HISTORY: Social History   Tobacco Use  . Smoking status: Former Smoker    Packs/day: 1.00    Years: 25.00    Pack years: 25.00    Types: Cigarettes    Start date: 07/07/1981    Last attempt to quit: 07/22/1998    Years since quitting: 19.0  . Smokeless tobacco: Never Used  Substance Use Topics  . Alcohol use: No    Alcohol/week: 0.0 oz    No Known Allergies  Current Outpatient Medications  Medication Sig Dispense Refill  . acetaminophen (TYLENOL) 500 MG tablet Take 1,000 mg by mouth 2 (two) times daily as needed for mild pain.    Marland Kitchen amLODipine (NORVASC) 10 MG tablet Take 1 tablet (10 mg total) by mouth daily. 30 tablet 6  . aspirin 81 MG tablet Take 81 mg by mouth daily as needed for pain. Reported on 11/10/2015    . atenolol (TENORMIN) 100 MG tablet Take 100 mg by mouth 2 (two) times daily.    . Calcium Carbonate-Vitamin  D (CALTRATE 600+D PO) Take 1 tablet by mouth daily.     . diphenhydramine-acetaminophen (TYLENOL PM) 25-500 MG TABS tablet Take 1 tablet by mouth 2 (two) times daily as needed (PAIN).    Marland Kitchen doxazosin (CARDURA) 8 MG tablet Take 8 mg by mouth daily.    Marland Kitchen epoetin alfa (EPOGEN,PROCRIT) 83662 UNIT/ML injection Inject 10,000 Units into the skin once a week. Friday    . Ferrous Sulfate (IRON) 325 (65 Fe) MG TABS Take 325 mg by mouth daily as needed. When he gets to feeling tired & sluggish.     . furosemide (LASIX) 80 MG tablet Take 80 mg by mouth 2 (two) times daily.    . hydrALAZINE (APRESOLINE) 25 MG tablet Take 1 tablet (25 mg total) by mouth 2 (two) times daily. (Patient taking differently: Take 25 mg by mouth daily. ) 60 tablet 6  . ibuprofen (ADVIL,MOTRIN) 200 MG tablet Take 200 mg by mouth daily as needed for moderate pain.     . Magnesium Citrate 100 MG TABS Take 1 tablet by mouth daily.    . nitroGLYCERIN (NITROSTAT) 0.4 MG SL tablet Place 1 tablet (0.4 mg total) under the tongue as directed. 25 tablet 2  . Omega-3 Fatty Acids (FISH OIL) 500 MG CAPS  Take 500 mg by mouth 2 (two) times daily.    Marland Kitchen oxyCODONE-acetaminophen (ROXICET) 5-325 MG tablet Take 1-2 tablets by mouth every 6 (six) hours as needed. 6 tablet 0  . sodium bicarbonate 650 MG tablet Take 1,300 mg by mouth 2 (two) times daily.      No current facility-administered medications for this visit.     REVIEW OF SYSTEMS:  [X]  denotes positive finding, [ ]  denotes negative finding Cardiac  Comments:  Chest pain or chest pressure:    Shortness of breath upon exertion:    Short of breath when lying flat:    Irregular heart rhythm:        Vascular    Pain in calf, thigh, or hip brought on by ambulation:    Pain in feet at night that wakes you up from your sleep:     Blood clot in your veins:    Leg swelling:         Pulmonary    Oxygen at home:    Productive cough:     Wheezing:         Neurologic    Sudden weakness in arms  or legs:     Sudden numbness in arms or legs:     Sudden onset of difficulty speaking or slurred speech:    Temporary loss of vision in one eye:     Problems with dizziness:         Gastrointestinal    Blood in stool:     Vomited blood:         Genitourinary    Burning when urinating:     Blood in urine:        Psychiatric    Major depression:         Hematologic    Bleeding problems:    Problems with blood clotting too easily:        Skin    Rashes or ulcers:        Constitutional    Fever or chills:     PHYSICAL EXAM:   Vitals:   08/20/17 1047 08/20/17 1049  BP: (!) 198/68 (!) 192/68  Pulse: (!) 57   Resp: 16   Temp: (!) 97.1 F (36.2 C)   TempSrc: Oral   SpO2: 100%   Weight: 168 lb (76.2 kg)   Height: 6\' 1"  (1.854 m)     GENERAL: The patient is a well-nourished male, in no acute distress. The vital signs are documented above. CARDIAC: There is a regular rate and rhythm.  VASCULAR: His fistula in the right upper arm is slightly pulsatile. He has a palpable radial pulse bilaterally. He has no significant lower extremity swelling. PULMONARY: There is good air exchange bilaterally without wheezing or rales. NEUROLOGIC: No focal weakness or paresthesias are detected. SKIN: There are no ulcers or rashes noted. PSYCHIATRIC: The patient has a normal affect.  DATA:    No new data  MEDICAL ISSUES:   STENOSIS OF RIGHT BRACHIOCEPHALIC AV FISTULA: The fistula is somewhat pulsatile and I suspect that he has a stenosis in the fistula or in the venous outflow.  For this reason I recommended a fistulogram.  If he has a significant stenosis we would likely be able to address this with venoplasty.  Hello we will schedule this on a nondialysis day.  Tentatively this is been scheduled for 09/05/2017.  We have discussed the indications for the procedure and the potential complications.  He understands there is a small  1-2% risk of bleeding or injuring the fistula.  [name] [facility] [phone]

## 2017-08-21 DIAGNOSIS — Z992 Dependence on renal dialysis: Secondary | ICD-10-CM | POA: Diagnosis not present

## 2017-08-21 DIAGNOSIS — Z23 Encounter for immunization: Secondary | ICD-10-CM | POA: Diagnosis not present

## 2017-08-21 DIAGNOSIS — D509 Iron deficiency anemia, unspecified: Secondary | ICD-10-CM | POA: Diagnosis not present

## 2017-08-21 DIAGNOSIS — N2581 Secondary hyperparathyroidism of renal origin: Secondary | ICD-10-CM | POA: Diagnosis not present

## 2017-08-21 DIAGNOSIS — N186 End stage renal disease: Secondary | ICD-10-CM | POA: Diagnosis not present

## 2017-08-21 DIAGNOSIS — D631 Anemia in chronic kidney disease: Secondary | ICD-10-CM | POA: Diagnosis not present

## 2017-08-23 DIAGNOSIS — D509 Iron deficiency anemia, unspecified: Secondary | ICD-10-CM | POA: Diagnosis not present

## 2017-08-23 DIAGNOSIS — N2581 Secondary hyperparathyroidism of renal origin: Secondary | ICD-10-CM | POA: Diagnosis not present

## 2017-08-23 DIAGNOSIS — N186 End stage renal disease: Secondary | ICD-10-CM | POA: Diagnosis not present

## 2017-08-23 DIAGNOSIS — D631 Anemia in chronic kidney disease: Secondary | ICD-10-CM | POA: Diagnosis not present

## 2017-08-23 DIAGNOSIS — Z992 Dependence on renal dialysis: Secondary | ICD-10-CM | POA: Diagnosis not present

## 2017-08-26 DIAGNOSIS — D509 Iron deficiency anemia, unspecified: Secondary | ICD-10-CM | POA: Diagnosis not present

## 2017-08-26 DIAGNOSIS — N186 End stage renal disease: Secondary | ICD-10-CM | POA: Diagnosis not present

## 2017-08-26 DIAGNOSIS — N2581 Secondary hyperparathyroidism of renal origin: Secondary | ICD-10-CM | POA: Diagnosis not present

## 2017-08-26 DIAGNOSIS — D631 Anemia in chronic kidney disease: Secondary | ICD-10-CM | POA: Diagnosis not present

## 2017-08-26 DIAGNOSIS — Z992 Dependence on renal dialysis: Secondary | ICD-10-CM | POA: Diagnosis not present

## 2017-08-28 DIAGNOSIS — D631 Anemia in chronic kidney disease: Secondary | ICD-10-CM | POA: Diagnosis not present

## 2017-08-28 DIAGNOSIS — D509 Iron deficiency anemia, unspecified: Secondary | ICD-10-CM | POA: Diagnosis not present

## 2017-08-28 DIAGNOSIS — N2581 Secondary hyperparathyroidism of renal origin: Secondary | ICD-10-CM | POA: Diagnosis not present

## 2017-08-28 DIAGNOSIS — N186 End stage renal disease: Secondary | ICD-10-CM | POA: Diagnosis not present

## 2017-08-28 DIAGNOSIS — Z992 Dependence on renal dialysis: Secondary | ICD-10-CM | POA: Diagnosis not present

## 2017-08-30 DIAGNOSIS — D509 Iron deficiency anemia, unspecified: Secondary | ICD-10-CM | POA: Diagnosis not present

## 2017-08-30 DIAGNOSIS — D631 Anemia in chronic kidney disease: Secondary | ICD-10-CM | POA: Diagnosis not present

## 2017-08-30 DIAGNOSIS — Z992 Dependence on renal dialysis: Secondary | ICD-10-CM | POA: Diagnosis not present

## 2017-08-30 DIAGNOSIS — N186 End stage renal disease: Secondary | ICD-10-CM | POA: Diagnosis not present

## 2017-08-30 DIAGNOSIS — N2581 Secondary hyperparathyroidism of renal origin: Secondary | ICD-10-CM | POA: Diagnosis not present

## 2017-09-01 ENCOUNTER — Telehealth: Payer: Self-pay | Admitting: *Deleted

## 2017-09-01 NOTE — Telephone Encounter (Signed)
Patient called and notified of time change for procedure on the 09/05/17. T be at [facility] admitting at 9:30 am for procedure. States he will call his transportation and instructed to call this office should he need any help with this change.

## 2017-09-02 DIAGNOSIS — N2581 Secondary hyperparathyroidism of renal origin: Secondary | ICD-10-CM | POA: Diagnosis not present

## 2017-09-02 DIAGNOSIS — Z992 Dependence on renal dialysis: Secondary | ICD-10-CM | POA: Diagnosis not present

## 2017-09-02 DIAGNOSIS — D631 Anemia in chronic kidney disease: Secondary | ICD-10-CM | POA: Diagnosis not present

## 2017-09-02 DIAGNOSIS — D509 Iron deficiency anemia, unspecified: Secondary | ICD-10-CM | POA: Diagnosis not present

## 2017-09-02 DIAGNOSIS — N186 End stage renal disease: Secondary | ICD-10-CM | POA: Diagnosis not present

## 2017-09-03 DIAGNOSIS — N401 Enlarged prostate with lower urinary tract symptoms: Secondary | ICD-10-CM | POA: Diagnosis not present

## 2017-09-03 DIAGNOSIS — D508 Other iron deficiency anemias: Secondary | ICD-10-CM | POA: Diagnosis not present

## 2017-09-03 DIAGNOSIS — I10 Essential (primary) hypertension: Secondary | ICD-10-CM | POA: Diagnosis not present

## 2017-09-03 DIAGNOSIS — K210 Gastro-esophageal reflux disease with esophagitis: Secondary | ICD-10-CM | POA: Diagnosis not present

## 2017-09-03 DIAGNOSIS — N185 Chronic kidney disease, stage 5: Secondary | ICD-10-CM | POA: Diagnosis not present

## 2017-09-05 ENCOUNTER — Encounter (HOSPITAL_COMMUNITY)
Admission: RE | Disposition: A | Discharge status: Home / Self Care | Payer: Self-pay | Source: Ambulatory Visit | Attending: Vascular Surgery

## 2017-09-05 ENCOUNTER — Encounter (HOSPITAL_COMMUNITY): Payer: Self-pay

## 2017-09-05 ENCOUNTER — Ambulatory Visit (HOSPITAL_COMMUNITY)
Admission: RE | Admit: 2017-09-05 | Discharge: 2017-09-05 | Disposition: A | Discharge status: Home / Self Care | Payer: Medicare Other | Source: Ambulatory Visit | Attending: Vascular Surgery | Admitting: Vascular Surgery

## 2017-09-05 DIAGNOSIS — Y832 Surgical operation with anastomosis, bypass or graft as the cause of abnormal reaction of the patient, or of later complication, without mention of misadventure at the time of the procedure: Secondary | ICD-10-CM | POA: Insufficient documentation

## 2017-09-05 DIAGNOSIS — T82858A Stenosis of vascular prosthetic devices, implants and grafts, initial encounter: Secondary | ICD-10-CM | POA: Insufficient documentation

## 2017-09-05 HISTORY — PX: A/V FISTULAGRAM: CATH118298

## 2017-09-05 HISTORY — PX: PERIPHERAL VASCULAR BALLOON ANGIOPLASTY: CATH118281

## 2017-09-05 LAB — POCT I-STAT, CHEM 8
BUN: 41 mg/dL — AB (ref 6–20)
CALCIUM ION: 1.13 mmol/L — AB (ref 1.15–1.40)
Chloride: 101 mmol/L (ref 101–111)
Creatinine, Ser: 11.4 mg/dL — ABNORMAL HIGH (ref 0.61–1.24)
Glucose, Bld: 113 mg/dL — ABNORMAL HIGH (ref 65–99)
HEMATOCRIT: 29 % — AB (ref 39.0–52.0)
HEMOGLOBIN: 9.9 g/dL — AB (ref 13.0–17.0)
Potassium: 4.5 mmol/L (ref 3.5–5.1)
SODIUM: 142 mmol/L (ref 135–145)
TCO2: 30 mmol/L (ref 22–32)

## 2017-09-05 SURGERY — A/V FISTULAGRAM
Anesthesia: LOCAL | Laterality: Right

## 2017-09-05 MED ORDER — LIDOCAINE HCL 1 % IJ SOLN
INTRAMUSCULAR | Status: AC
  Filled 2017-09-05: qty 20

## 2017-09-05 MED ORDER — MIDAZOLAM HCL 2 MG/2ML IJ SOLN
INTRAMUSCULAR | Status: DC | PRN
  Administered 2017-09-05: 1 mg via INTRAVENOUS

## 2017-09-05 MED ORDER — FENTANYL CITRATE (PF) 100 MCG/2ML IJ SOLN
INTRAMUSCULAR | Status: DC | PRN
  Administered 2017-09-05: 50 ug via INTRAVENOUS

## 2017-09-05 MED ORDER — HEPARIN SODIUM (PORCINE) 1000 UNIT/ML IJ SOLN
INTRAMUSCULAR | Status: DC | PRN
  Administered 2017-09-05: 5000 [IU] via INTRAVENOUS

## 2017-09-05 MED ORDER — HEPARIN SODIUM (PORCINE) 1000 UNIT/ML IJ SOLN
INTRAMUSCULAR | Status: AC
  Filled 2017-09-05: qty 1

## 2017-09-05 MED ORDER — SODIUM CHLORIDE 0.9 % IV SOLN: 250.0000 mL | INTRAVENOUS | Status: DC | PRN

## 2017-09-05 MED ORDER — FENTANYL CITRATE (PF) 100 MCG/2ML IJ SOLN
INTRAMUSCULAR | Status: AC
  Filled 2017-09-05: qty 2

## 2017-09-05 MED ORDER — SODIUM CHLORIDE 0.9% FLUSH
3.0000 mL | Freq: Two times a day (BID) | INTRAVENOUS | Status: DC
Start: 2017-09-05 — End: 2017-09-05

## 2017-09-05 MED ORDER — MIDAZOLAM HCL 2 MG/2ML IJ SOLN
INTRAMUSCULAR | Status: AC
  Filled 2017-09-05: qty 2

## 2017-09-05 MED ORDER — IODIXANOL 320 MG/ML IV SOLN
INTRAVENOUS | Status: DC | PRN
  Administered 2017-09-05: 22 mL

## 2017-09-05 MED ORDER — SODIUM CHLORIDE 0.9% FLUSH: 3.0000 mL | INTRAVENOUS | Status: DC | PRN

## 2017-09-05 MED ORDER — HEPARIN (PORCINE) IN NACL 2-0.9 UNIT/ML-% IJ SOLN
INTRAMUSCULAR | Status: AC | PRN
  Administered 2017-09-05: 500 mL

## 2017-09-05 MED ORDER — HEPARIN (PORCINE) IN NACL 2-0.9 UNIT/ML-% IJ SOLN
INTRAMUSCULAR | Status: AC
  Filled 2017-09-05: qty 500

## 2017-09-05 MED ORDER — LIDOCAINE HCL (PF) 1 % IJ SOLN
INTRAMUSCULAR | Status: DC | PRN
  Administered 2017-09-05: 2 mL via INTRADERMAL

## 2017-09-05 SURGICAL SUPPLY — 17 items
BAG SNAP BAND KOVER 36X36 (MISCELLANEOUS) ×2 IMPLANT
BALLN MUSTANG 6.0X40 75 (BALLOONS) ×2
BALLN MUSTANG 7.0X20 75 (BALLOONS) ×2
BALLOON MUSTANG 6.0X40 75 (BALLOONS) ×1 IMPLANT
BALLOON MUSTANG 7.0X20 75 (BALLOONS) ×1 IMPLANT
COVER DOME SNAP 22 D (MISCELLANEOUS) ×2 IMPLANT
COVER PRB 48X5XTLSCP FOLD TPE (BAG) ×1 IMPLANT
COVER PROBE 5X48 (BAG) ×1
KIT ENCORE 26 ADVANTAGE (KITS) ×2 IMPLANT
PROTECTION STATION PRESSURIZED (MISCELLANEOUS) ×2
SET INTRODUCER MICROPUNCT 5F (INTRODUCER) ×2 IMPLANT
SHEATH PINNACLE R/O II 6F 4CM (SHEATH) ×2 IMPLANT
STATION PROTECTION PRESSURIZED (MISCELLANEOUS) ×1 IMPLANT
STOPCOCK MORSE 400PSI 3WAY (MISCELLANEOUS) ×2 IMPLANT
TRAY PV CATH (CUSTOM PROCEDURE TRAY) ×2 IMPLANT
TUBING CIL FLEX 10 FLL-RA (TUBING) ×2 IMPLANT
WIRE BENTSON .035X145CM (WIRE) ×2 IMPLANT

## 2017-09-05 NOTE — Discharge Instructions (Signed)
AV Fistula Placement, Care After °Refer to this sheet in the next few weeks. These instructions provide you with information about caring for yourself after your procedure. Your health care provider may also give you more specific instructions. Your treatment has been planned according to current medical practices, but problems sometimes occur. Call your health care provider if you have any problems or questions after your procedure. °What can I expect after the procedure? °After your procedure, it is common to: °· Feel sore. °· Have numbness. °· Feel cold. °· Feel a vibration (thrill) over the fistula. ° °Follow these instructions at home: ° °Incision care °· Do not take baths or showers, swim, or use a hot tub until your health care provider approves. °· Keep the area around your cut from surgery (incision) clean and dry. °· Follow instructions from your health care provider about how to take care of your incision. Make sure you: °? Wash your hands with soap and water before you change your bandage (dressing). If soap and water are not available, use hand sanitizer. °? Change your dressing as told by your health care provider. °? Leave stitches (sutures) and clips in place. They may need to stay in place for 2 weeks or longer. °Fistula Care °· Check your fistula site every day to make sure the thrill feels the same. °· Check your fistula site every day for signs of infection. Watch for: °? Redness. °? Swelling. °? Discharge. °? Tenderness. °? Enlargement. °· Keep your arm raised (elevated) while you rest. °· Do not lift anything that is heavier than a gallon of milk with the arm that has the fistula. °· Do not lie down on your fistula arm. °· Do not let anyone draw blood or take a blood pressure reading on your fistula arm. °· Do not wear tight jewelry or clothing over your fistula arm. °General instructions °· Rest at home for a day or two. °· Gradually start doing your usual activities again. Ask your surgeon  when you can return to work or school. °· Take over-the-counter and prescription medicines only as told by your health care provider. °· Keep all follow-up visits as told by your health care provider. This is important. °Contact a health care provider if: °· You have chills or a fever. °· You have pain at your fistula site that is not going away. °· You have numbness or coldness at your fistula site that is not going away. °· You feel a decrease or a change in the thrill. °· You have swelling in your arm or hand. °· You have redness, swelling, discharge, tenderness, or enlargement at your fistula site. °Get help right away if: °· You are bleeding from your fistula site. °· You have chest pain. °· You have trouble breathing. °This information is not intended to replace advice given to you by your health care provider. Make sure you discuss any questions you have with your health care provider. °Document Released: 07/08/2005 Document Revised: 11/15/2015 Document Reviewed: 09/28/2014 °Elsevier Interactive Patient Education © 2018 [employer] ° °

## 2017-09-05 NOTE — Op Note (Signed)
   PATIENT: Rodney Gordon   MRN: [id number] DOB: 12-01-1955    DATE OF PROCEDURE: 09/05/2017  INDICATIONS:    [name] is a 62 y.o. male who presents with a pulsatile right brachiocephalic AV fistula.  We suspect that he had an outflow stenosis and he presents for a fistulogram.  PROCEDURE:    1.  Ultrasound-guided access to right brachiocephalic AV fistula` 2.  Fistulogram right brachiocephalic AV fistula 3.  Venoplasty of 3 tandem stenoses and right brachiocephalic AV fistula 4.  Conscious sedation  SURGEON: Judeth Cornfield. Scot Dock, MD, FACS  ANESTHESIA: Local with sedation  EBL: Minimal  TECHNIQUE: The patient was taken to the peripheral vascular lab and was sedated. The period of conscious sedation was 39 minutes.  During that time period, I was present face-to-face 100% of the time.  The patient was administered 1 mg of Versed and 50 mcg of fentanyl. The patient's heart rate, blood pressure, and oxygen saturation were monitored by the nurse continuously during the procedure.  The right arm was prepped and draped in usual sterile fashion.  Under ultrasound guidance, after the skin was anesthetized, the proximal fistula was cannulated with a micropuncture needle and a micropuncture sheath introduced over the wire.  A fistulogram was obtained to evaluate the fistula from the point of cannulation to include the central veins.  There were 3 tandem stenoses in the upper part of the fistula which I elected to address with venoplasty.  I exchanged the micropuncture sheath for a short 6 French sheath over a Bentson wire.  The patient was then heparinized.  I initially selected a 6 mm x 4 cm balloon which was positioned across the stenosis and inflated to rated pressure for 1 minute.  Follow-up film showed residual stenosis.  I went back with a 7 mm x 2 cm balloon.  This was positioned across each stenosis and inflated to 20 atm for 1 minute.  Completion film showed significant improvement.  Of  note with the balloon inflated we also obtained a reflux shot to evaluate the proximal fistula and the arterial anastomosis.   FINDINGS:   1.  Patent right upper arm AV fistula 2.  3 tandem stenoses within the fistula centrally which will addressed with venoplasty as described above 3.  1 moderate competing branch in the proximal fistula 4.  Mild stenosis at the arterial anastomosis.  CLINICAL: Patient is undergone successful venoplasty of several stenoses within the central portion of the fistula.  If in the future the patient has continued problems with flow I think he should be considered for ligation of the competing branch and possible exploration of the moderate arterial anastomotic stenosis.  Currently this does not appear to be clinically significant.  [name], MD, FACS [facility]  DATE OF DICTATION:   09/05/2017

## 2017-09-06 DIAGNOSIS — N186 End stage renal disease: Secondary | ICD-10-CM | POA: Diagnosis not present

## 2017-09-06 DIAGNOSIS — Z992 Dependence on renal dialysis: Secondary | ICD-10-CM | POA: Diagnosis not present

## 2017-09-06 DIAGNOSIS — D631 Anemia in chronic kidney disease: Secondary | ICD-10-CM | POA: Diagnosis not present

## 2017-09-06 DIAGNOSIS — N2581 Secondary hyperparathyroidism of renal origin: Secondary | ICD-10-CM | POA: Diagnosis not present

## 2017-09-06 DIAGNOSIS — D509 Iron deficiency anemia, unspecified: Secondary | ICD-10-CM | POA: Diagnosis not present

## 2017-09-08 MED FILL — Lidocaine HCl Local Inj 1%: INTRAMUSCULAR | Qty: 20 | Status: AC

## 2017-09-09 DIAGNOSIS — D631 Anemia in chronic kidney disease: Secondary | ICD-10-CM | POA: Diagnosis not present

## 2017-09-09 DIAGNOSIS — Z992 Dependence on renal dialysis: Secondary | ICD-10-CM | POA: Diagnosis not present

## 2017-09-09 DIAGNOSIS — N186 End stage renal disease: Secondary | ICD-10-CM | POA: Diagnosis not present

## 2017-09-09 DIAGNOSIS — D509 Iron deficiency anemia, unspecified: Secondary | ICD-10-CM | POA: Diagnosis not present

## 2017-09-09 DIAGNOSIS — N2581 Secondary hyperparathyroidism of renal origin: Secondary | ICD-10-CM | POA: Diagnosis not present

## 2017-09-11 DIAGNOSIS — Z992 Dependence on renal dialysis: Secondary | ICD-10-CM | POA: Diagnosis not present

## 2017-09-11 DIAGNOSIS — N2581 Secondary hyperparathyroidism of renal origin: Secondary | ICD-10-CM | POA: Diagnosis not present

## 2017-09-11 DIAGNOSIS — D509 Iron deficiency anemia, unspecified: Secondary | ICD-10-CM | POA: Diagnosis not present

## 2017-09-11 DIAGNOSIS — N186 End stage renal disease: Secondary | ICD-10-CM | POA: Diagnosis not present

## 2017-09-11 DIAGNOSIS — D631 Anemia in chronic kidney disease: Secondary | ICD-10-CM | POA: Diagnosis not present

## 2017-09-13 DIAGNOSIS — D631 Anemia in chronic kidney disease: Secondary | ICD-10-CM | POA: Diagnosis not present

## 2017-09-13 DIAGNOSIS — N186 End stage renal disease: Secondary | ICD-10-CM | POA: Diagnosis not present

## 2017-09-13 DIAGNOSIS — N2581 Secondary hyperparathyroidism of renal origin: Secondary | ICD-10-CM | POA: Diagnosis not present

## 2017-09-13 DIAGNOSIS — Z992 Dependence on renal dialysis: Secondary | ICD-10-CM | POA: Diagnosis not present

## 2017-09-13 DIAGNOSIS — D509 Iron deficiency anemia, unspecified: Secondary | ICD-10-CM | POA: Diagnosis not present

## 2017-09-16 DIAGNOSIS — N186 End stage renal disease: Secondary | ICD-10-CM | POA: Diagnosis not present

## 2017-09-16 DIAGNOSIS — D509 Iron deficiency anemia, unspecified: Secondary | ICD-10-CM | POA: Diagnosis not present

## 2017-09-16 DIAGNOSIS — D631 Anemia in chronic kidney disease: Secondary | ICD-10-CM | POA: Diagnosis not present

## 2017-09-16 DIAGNOSIS — Z992 Dependence on renal dialysis: Secondary | ICD-10-CM | POA: Diagnosis not present

## 2017-09-16 DIAGNOSIS — N2581 Secondary hyperparathyroidism of renal origin: Secondary | ICD-10-CM | POA: Diagnosis not present

## 2017-09-18 DIAGNOSIS — Z992 Dependence on renal dialysis: Secondary | ICD-10-CM | POA: Diagnosis not present

## 2017-09-18 DIAGNOSIS — D631 Anemia in chronic kidney disease: Secondary | ICD-10-CM | POA: Diagnosis not present

## 2017-09-18 DIAGNOSIS — N186 End stage renal disease: Secondary | ICD-10-CM | POA: Diagnosis not present

## 2017-09-18 DIAGNOSIS — N2581 Secondary hyperparathyroidism of renal origin: Secondary | ICD-10-CM | POA: Diagnosis not present

## 2017-09-18 DIAGNOSIS — D509 Iron deficiency anemia, unspecified: Secondary | ICD-10-CM | POA: Diagnosis not present

## 2017-09-20 DIAGNOSIS — D631 Anemia in chronic kidney disease: Secondary | ICD-10-CM | POA: Diagnosis not present

## 2017-09-20 DIAGNOSIS — Z992 Dependence on renal dialysis: Secondary | ICD-10-CM | POA: Diagnosis not present

## 2017-09-20 DIAGNOSIS — N2581 Secondary hyperparathyroidism of renal origin: Secondary | ICD-10-CM | POA: Diagnosis not present

## 2017-09-20 DIAGNOSIS — N186 End stage renal disease: Secondary | ICD-10-CM | POA: Diagnosis not present

## 2017-09-20 DIAGNOSIS — D509 Iron deficiency anemia, unspecified: Secondary | ICD-10-CM | POA: Diagnosis not present

## 2017-09-23 DIAGNOSIS — Z992 Dependence on renal dialysis: Secondary | ICD-10-CM | POA: Diagnosis not present

## 2017-09-23 DIAGNOSIS — D509 Iron deficiency anemia, unspecified: Secondary | ICD-10-CM | POA: Diagnosis not present

## 2017-09-23 DIAGNOSIS — N2581 Secondary hyperparathyroidism of renal origin: Secondary | ICD-10-CM | POA: Diagnosis not present

## 2017-09-23 DIAGNOSIS — N186 End stage renal disease: Secondary | ICD-10-CM | POA: Diagnosis not present

## 2017-09-23 DIAGNOSIS — D631 Anemia in chronic kidney disease: Secondary | ICD-10-CM | POA: Diagnosis not present

## 2017-09-25 DIAGNOSIS — N186 End stage renal disease: Secondary | ICD-10-CM | POA: Diagnosis not present

## 2017-09-25 DIAGNOSIS — Z992 Dependence on renal dialysis: Secondary | ICD-10-CM | POA: Diagnosis not present

## 2017-09-25 DIAGNOSIS — D631 Anemia in chronic kidney disease: Secondary | ICD-10-CM | POA: Diagnosis not present

## 2017-09-25 DIAGNOSIS — N2581 Secondary hyperparathyroidism of renal origin: Secondary | ICD-10-CM | POA: Diagnosis not present

## 2017-09-25 DIAGNOSIS — D509 Iron deficiency anemia, unspecified: Secondary | ICD-10-CM | POA: Diagnosis not present

## 2017-09-27 DIAGNOSIS — D631 Anemia in chronic kidney disease: Secondary | ICD-10-CM | POA: Diagnosis not present

## 2017-09-27 DIAGNOSIS — D509 Iron deficiency anemia, unspecified: Secondary | ICD-10-CM | POA: Diagnosis not present

## 2017-09-27 DIAGNOSIS — N186 End stage renal disease: Secondary | ICD-10-CM | POA: Diagnosis not present

## 2017-09-27 DIAGNOSIS — Z992 Dependence on renal dialysis: Secondary | ICD-10-CM | POA: Diagnosis not present

## 2017-09-27 DIAGNOSIS — N2581 Secondary hyperparathyroidism of renal origin: Secondary | ICD-10-CM | POA: Diagnosis not present

## 2017-09-30 DIAGNOSIS — Z992 Dependence on renal dialysis: Secondary | ICD-10-CM | POA: Diagnosis not present

## 2017-09-30 DIAGNOSIS — D509 Iron deficiency anemia, unspecified: Secondary | ICD-10-CM | POA: Diagnosis not present

## 2017-09-30 DIAGNOSIS — N2581 Secondary hyperparathyroidism of renal origin: Secondary | ICD-10-CM | POA: Diagnosis not present

## 2017-09-30 DIAGNOSIS — D631 Anemia in chronic kidney disease: Secondary | ICD-10-CM | POA: Diagnosis not present

## 2017-09-30 DIAGNOSIS — N186 End stage renal disease: Secondary | ICD-10-CM | POA: Diagnosis not present

## 2017-10-02 DIAGNOSIS — N2581 Secondary hyperparathyroidism of renal origin: Secondary | ICD-10-CM | POA: Diagnosis not present

## 2017-10-02 DIAGNOSIS — Z992 Dependence on renal dialysis: Secondary | ICD-10-CM | POA: Diagnosis not present

## 2017-10-02 DIAGNOSIS — D631 Anemia in chronic kidney disease: Secondary | ICD-10-CM | POA: Diagnosis not present

## 2017-10-02 DIAGNOSIS — D509 Iron deficiency anemia, unspecified: Secondary | ICD-10-CM | POA: Diagnosis not present

## 2017-10-02 DIAGNOSIS — N186 End stage renal disease: Secondary | ICD-10-CM | POA: Diagnosis not present

## 2017-10-04 DIAGNOSIS — N186 End stage renal disease: Secondary | ICD-10-CM | POA: Diagnosis not present

## 2017-10-04 DIAGNOSIS — Z992 Dependence on renal dialysis: Secondary | ICD-10-CM | POA: Diagnosis not present

## 2017-10-04 DIAGNOSIS — N2581 Secondary hyperparathyroidism of renal origin: Secondary | ICD-10-CM | POA: Diagnosis not present

## 2017-10-04 DIAGNOSIS — D631 Anemia in chronic kidney disease: Secondary | ICD-10-CM | POA: Diagnosis not present

## 2017-10-04 DIAGNOSIS — D509 Iron deficiency anemia, unspecified: Secondary | ICD-10-CM | POA: Diagnosis not present

## 2017-10-07 DIAGNOSIS — Z992 Dependence on renal dialysis: Secondary | ICD-10-CM | POA: Diagnosis not present

## 2017-10-07 DIAGNOSIS — N186 End stage renal disease: Secondary | ICD-10-CM | POA: Diagnosis not present

## 2017-10-07 DIAGNOSIS — N2581 Secondary hyperparathyroidism of renal origin: Secondary | ICD-10-CM | POA: Diagnosis not present

## 2017-10-07 DIAGNOSIS — D631 Anemia in chronic kidney disease: Secondary | ICD-10-CM | POA: Diagnosis not present

## 2017-10-07 DIAGNOSIS — D509 Iron deficiency anemia, unspecified: Secondary | ICD-10-CM | POA: Diagnosis not present

## 2017-10-09 DIAGNOSIS — N186 End stage renal disease: Secondary | ICD-10-CM | POA: Diagnosis not present

## 2017-10-09 DIAGNOSIS — D631 Anemia in chronic kidney disease: Secondary | ICD-10-CM | POA: Diagnosis not present

## 2017-10-09 DIAGNOSIS — D509 Iron deficiency anemia, unspecified: Secondary | ICD-10-CM | POA: Diagnosis not present

## 2017-10-09 DIAGNOSIS — N2581 Secondary hyperparathyroidism of renal origin: Secondary | ICD-10-CM | POA: Diagnosis not present

## 2017-10-09 DIAGNOSIS — Z992 Dependence on renal dialysis: Secondary | ICD-10-CM | POA: Diagnosis not present

## 2017-10-11 DIAGNOSIS — D631 Anemia in chronic kidney disease: Secondary | ICD-10-CM | POA: Diagnosis not present

## 2017-10-11 DIAGNOSIS — N2581 Secondary hyperparathyroidism of renal origin: Secondary | ICD-10-CM | POA: Diagnosis not present

## 2017-10-11 DIAGNOSIS — D509 Iron deficiency anemia, unspecified: Secondary | ICD-10-CM | POA: Diagnosis not present

## 2017-10-11 DIAGNOSIS — Z992 Dependence on renal dialysis: Secondary | ICD-10-CM | POA: Diagnosis not present

## 2017-10-11 DIAGNOSIS — N186 End stage renal disease: Secondary | ICD-10-CM | POA: Diagnosis not present

## 2017-10-14 DIAGNOSIS — N2581 Secondary hyperparathyroidism of renal origin: Secondary | ICD-10-CM | POA: Diagnosis not present

## 2017-10-14 DIAGNOSIS — N186 End stage renal disease: Secondary | ICD-10-CM | POA: Diagnosis not present

## 2017-10-14 DIAGNOSIS — Z992 Dependence on renal dialysis: Secondary | ICD-10-CM | POA: Diagnosis not present

## 2017-10-14 DIAGNOSIS — D631 Anemia in chronic kidney disease: Secondary | ICD-10-CM | POA: Diagnosis not present

## 2017-10-14 DIAGNOSIS — D509 Iron deficiency anemia, unspecified: Secondary | ICD-10-CM | POA: Diagnosis not present

## 2017-10-16 ENCOUNTER — Ambulatory Visit: Payer: Self-pay | Admitting: Surgery

## 2017-10-16 DIAGNOSIS — N186 End stage renal disease: Secondary | ICD-10-CM | POA: Diagnosis not present

## 2017-10-16 DIAGNOSIS — Z992 Dependence on renal dialysis: Secondary | ICD-10-CM | POA: Diagnosis not present

## 2017-10-16 NOTE — H&P (View-Only) (Signed)
Surgical H&P  CC: ESRD  HPI: this is a very nice 62 year old gentleman who presented to discuss peritoneal dialysis catheter insertion.  He has been on hemodialysis for 4-5 months and he reports that his renal failure was secondary to obstructive uropathy from prostate enlargement.  Hemodialysis going well. He goes tu/thurs/ Saturdays. He is interested in peritoneal dialysis so that he does not have to spend all of his time in the dialysis unit. He does have a history of moderate aortic regurgitation, hypertension, chronic anemia.  He sees Dr. Domenic Polite and cardiology and last saw him in November. He denies any chest pain. He has never had any abdominal surgery. He had TURP. 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  No Known Allergies  Past Medical History:  Diagnosis Date  . Anemia of chronic disease   . Aortic insufficiency    Mild to moderate  . Coronary atherosclerosis of native coronary artery    Nonobstructive at catheterization 2004  . ESRD on hemodialysis ([place])    [name]  . Essential hypertension, benign   . Peripheral vascular disease [facility])     Past Surgical History:  Procedure Laterality Date  . A/V FISTULAGRAM Right 09/05/2017   Procedure: A/V FISTULAGRAM;  Surgeon: Angelia Mould, MD;  Location: [facility];  Service: Cardiovascular;  Laterality: Right;  . AV FISTULA PLACEMENT Right 12/17/2016   Procedure: ARTERIOVENOUS (AV) FISTULA CREATION- RIGHT BRACHIOCEPHALIC;  Surgeon: Elam Dutch, MD;  Location: [facility];  Service: Vascular;  Laterality: Right;  . CARDIAC CATHETERIZATION     at [facility]  . IR DIALY SHUNT INTRO NEEDLE/INTRACATH INITIAL W/IMG RIGHT Right 04/24/2017  . IR US GUIDE VASC ACCESS RIGHT  04/24/2017  . PERIPHERAL VASCULAR BALLOON ANGIOPLASTY Right 09/05/2017   Procedure: PERIPHERAL VASCULAR BALLOON ANGIOPLASTY;  Surgeon: Angelia Mould, MD;  Location: [facility];  Service: Cardiovascular;  Laterality: Right;  . PROSTATE SURGERY    . TIBIA FRACTURE SURGERY     Left    Family History  Problem Relation Age of Onset  . Cancer Other   . Coronary artery disease Other     Social History   Socioeconomic History  . Marital status: Single    Spouse name: Not on file  . Number of children: Not on file  . Years of education: Not on file  . Highest education level: Not on file  Occupational History  . Not on file  Social Needs  . Financial resource strain: Not on file  . Food insecurity:    Worry: Not on file    Inability: Not on file  . Transportation needs:    Medical: Not on file    Non-medical: Not on file  Tobacco Use  . Smoking status: Former Smoker    Packs/day: 1.00    Years: 25.00    Pack years: 25.00    Types: Cigarettes    Start date: 07/07/1981    Last attempt to quit: 07/22/1998    Years since quitting: 19.2  . Smokeless tobacco: Never Used  Substance and Sexual Activity  . Alcohol use: No    Alcohol/week: 0.0 oz  . Drug use: No  . Sexual activity: Not on file  Lifestyle  . Physical activity:    Days per week: Not on file    Minutes per session: Not on file  . Stress: Not on file  Relationships  . Social connections:  Talks on phone: Not on file    Gets together: Not on file    Attends religious service: Not on file    Active member of club or organization: Not on file    Attends meetings of clubs or organizations: Not on file    Relationship status: Not on file  Other Topics Concern  . Not on file  Social History Narrative  . Not on file    Current Outpatient Medications on File Prior to Visit  Medication Sig Dispense Refill  . acetaminophen (TYLENOL) 500 MG tablet Take 1,000 mg by mouth 2 (two) times daily as needed for mild pain.    Marland Kitchen acetaminophen (TYLENOL) 650 MG CR tablet Take 650 mg by mouth daily as needed for pain.    Marland Kitchen amLODipine (NORVASC) 10 MG tablet Take 1 tablet (10 mg total) by mouth  daily. 30 tablet 6  . aspirin 81 MG tablet Take 81 mg by mouth daily.     Marland Kitchen atenolol (TENORMIN) 100 MG tablet Take 100 mg by mouth daily.     . diphenhydramine-acetaminophen (TYLENOL PM) 25-500 MG TABS tablet Take 1 tablet by mouth 2 (two) times daily as needed (PAIN).    Marland Kitchen doxazosin (CARDURA) 4 MG tablet Take 4 mg by mouth daily.    Marland Kitchen epoetin alfa (EPOGEN,PROCRIT) 96283 UNIT/ML injection Inject 10,000 Units into the skin once a week.     . furosemide (LASIX) 80 MG tablet Take 80 mg by mouth 2 (two) times daily.    Marland Kitchen lidocaine-prilocaine (EMLA) cream Apply 1 application topically as needed (prior to dialysis).     . nitroGLYCERIN (NITROSTAT) 0.4 MG SL tablet Place 1 tablet (0.4 mg total) under the tongue as directed. (Patient taking differently: Place 0.4 mg under the tongue every 5 (five) minutes as needed for chest pain. ) 25 tablet 2  . sodium bicarbonate 650 MG tablet Take 1,300 mg by mouth 4 (four) times daily.     . [DISCONTINUED] isosorbide mononitrate (IMDUR) 30 MG 24 hr tablet Take 30 mg by mouth daily.       No current facility-administered medications on file prior to visit.     Review of Systems: a complete, 10pt review of systems was completed with pertinent positives and negatives as documented in the HPI  Physical Exam: There were no vitals filed for this visit. Gen: A&Ox3, no distress  Head: normocephalic, atraumatic Eyes: extraocular motions intact, anicteric.  Neck: supple without mass or thyromegaly Chest: unlabored respirations, symmetrical air entry, clear bilaterally   Cardiovascular: RRR with palpable distal pulses, no pedal edema. RUE AVF Abdomen: soft, nondistended, nontender. No mass or organomegaly. Small reduced umbilical hernia. Extremities: warm, without edema, no deformities  Neuro: grossly intact Psych: appropriate mood and affect, normal insight  Skin: warm and dry   CBC Latest Ref Rng & Units 09/05/2017 12/17/2016  Hemoglobin 13.0 - 17.0 g/dL 9.9(L)  9.2(L)  Hematocrit 39.0 - 52.0 % 29.0(L) 27.0(L)    CMP Latest Ref Rng & Units 09/05/2017 12/17/2016  Glucose 65 - 99 mg/dL 113(H) 101(H)  BUN 6 - 20 mg/dL 41(H) -  Creatinine 0.61 - 1.24 mg/dL 11.40(H) -  Sodium 135 - 145 mmol/L 142 144  Potassium 3.5 - 5.1 mmol/L 4.5 4.4  Chloride 101 - 111 mmol/L 101 -    No results found for: INR, PROTIME  Imaging: No results found.   A/P: 62 year old gentleman with end-stage renal disease on dialysis, desires peritoneal dialysis access  We discussed the laparoscopic technique  and the risks of bleeding, infection, pain, scarring, injury to intra-abdominal organs, catheter malfunction, infection or occlusion and possible need for revision or removal in the future.  Discussed risks of general anesthesia including heart attack, pneumonia, stroke, blood clots, death. Discussed ongoing follow up with the dialysis nurse and a 6-8 week period of healing and education prior to the catheter being able to be used. Questions were welcomed and answered. He is eager to proceed. He will need cardiac clearance prior to surgery.   Romana Juniper, MD [facility], [place] Pager [phone]

## 2017-10-16 NOTE — H&P (Signed)
Surgical H&P  CC: ESRD  HPI: this is a very nice 62 year old gentleman who presented to discuss peritoneal dialysis catheter insertion.  He has been on hemodialysis for 4-5 months and he reports that his renal failure was secondary to obstructive uropathy from prostate enlargement.  Hemodialysis going well. He goes tu/thurs/ Saturdays. He is interested in peritoneal dialysis so that he does not have to spend all of his time in the dialysis unit. He does have a history of moderate aortic regurgitation, hypertension, chronic anemia.  He sees Dr. Domenic Polite and cardiology and last saw him in November. He denies any chest pain. He has never had any abdominal surgery. He had TURP. 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  No Known Allergies  Past Medical History:  Diagnosis Date  . Anemia of chronic disease   . Aortic insufficiency    Mild to moderate  . Coronary atherosclerosis of native coronary artery    Nonobstructive at catheterization 2004  . ESRD on hemodialysis ([place])    [name]  . Essential hypertension, benign   . Peripheral vascular disease [facility])     Past Surgical History:  Procedure Laterality Date  . A/V FISTULAGRAM Right 09/05/2017   Procedure: A/V FISTULAGRAM;  Surgeon: Angelia Mould, MD;  Location: [facility];  Service: Cardiovascular;  Laterality: Right;  . AV FISTULA PLACEMENT Right 12/17/2016   Procedure: ARTERIOVENOUS (AV) FISTULA CREATION- RIGHT BRACHIOCEPHALIC;  Surgeon: Elam Dutch, MD;  Location: [facility];  Service: Vascular;  Laterality: Right;  . CARDIAC CATHETERIZATION     at [facility]  . IR DIALY SHUNT INTRO NEEDLE/INTRACATH INITIAL W/IMG RIGHT Right 04/24/2017  . IR US GUIDE VASC ACCESS RIGHT  04/24/2017  . PERIPHERAL VASCULAR BALLOON ANGIOPLASTY Right 09/05/2017   Procedure: PERIPHERAL VASCULAR BALLOON ANGIOPLASTY;  Surgeon: Angelia Mould, MD;  Location: [facility];  Service: Cardiovascular;  Laterality: Right;  . PROSTATE SURGERY    . TIBIA FRACTURE SURGERY     Left    Family History  Problem Relation Age of Onset  . Cancer Other   . Coronary artery disease Other     Social History   Socioeconomic History  . Marital status: Single    Spouse name: Not on file  . Number of children: Not on file  . Years of education: Not on file  . Highest education level: Not on file  Occupational History  . Not on file  Social Needs  . Financial resource strain: Not on file  . Food insecurity:    Worry: Not on file    Inability: Not on file  . Transportation needs:    Medical: Not on file    Non-medical: Not on file  Tobacco Use  . Smoking status: Former Smoker    Packs/day: 1.00    Years: 25.00    Pack years: 25.00    Types: Cigarettes    Start date: 07/07/1981    Last attempt to quit: 07/22/1998    Years since quitting: 19.2  . Smokeless tobacco: Never Used  Substance and Sexual Activity  . Alcohol use: No    Alcohol/week: 0.0 oz  . Drug use: No  . Sexual activity: Not on file  Lifestyle  . Physical activity:    Days per week: Not on file    Minutes per session: Not on file  . Stress: Not on file  Relationships  . Social connections:  Talks on phone: Not on file    Gets together: Not on file    Attends religious service: Not on file    Active member of club or organization: Not on file    Attends meetings of clubs or organizations: Not on file    Relationship status: Not on file  Other Topics Concern  . Not on file  Social History Narrative  . Not on file    Current Outpatient Medications on File Prior to Visit  Medication Sig Dispense Refill  . acetaminophen (TYLENOL) 500 MG tablet Take 1,000 mg by mouth 2 (two) times daily as needed for mild pain.    Marland Kitchen acetaminophen (TYLENOL) 650 MG CR tablet Take 650 mg by mouth daily as needed for pain.    Marland Kitchen amLODipine (NORVASC) 10 MG tablet Take 1 tablet (10 mg total) by mouth  daily. 30 tablet 6  . aspirin 81 MG tablet Take 81 mg by mouth daily.     Marland Kitchen atenolol (TENORMIN) 100 MG tablet Take 100 mg by mouth daily.     . diphenhydramine-acetaminophen (TYLENOL PM) 25-500 MG TABS tablet Take 1 tablet by mouth 2 (two) times daily as needed (PAIN).    Marland Kitchen doxazosin (CARDURA) 4 MG tablet Take 4 mg by mouth daily.    Marland Kitchen epoetin alfa (EPOGEN,PROCRIT) 51025 UNIT/ML injection Inject 10,000 Units into the skin once a week.     . furosemide (LASIX) 80 MG tablet Take 80 mg by mouth 2 (two) times daily.    Marland Kitchen lidocaine-prilocaine (EMLA) cream Apply 1 application topically as needed (prior to dialysis).     . nitroGLYCERIN (NITROSTAT) 0.4 MG SL tablet Place 1 tablet (0.4 mg total) under the tongue as directed. (Patient taking differently: Place 0.4 mg under the tongue every 5 (five) minutes as needed for chest pain. ) 25 tablet 2  . sodium bicarbonate 650 MG tablet Take 1,300 mg by mouth 4 (four) times daily.     . [DISCONTINUED] isosorbide mononitrate (IMDUR) 30 MG 24 hr tablet Take 30 mg by mouth daily.       No current facility-administered medications on file prior to visit.     Review of Systems: a complete, 10pt review of systems was completed with pertinent positives and negatives as documented in the HPI  Physical Exam: There were no vitals filed for this visit. Gen: A&Ox3, no distress  Head: normocephalic, atraumatic Eyes: extraocular motions intact, anicteric.  Neck: supple without mass or thyromegaly Chest: unlabored respirations, symmetrical air entry, clear bilaterally   Cardiovascular: RRR with palpable distal pulses, no pedal edema. RUE AVF Abdomen: soft, nondistended, nontender. No mass or organomegaly. Small reduced umbilical hernia. Extremities: warm, without edema, no deformities  Neuro: grossly intact Psych: appropriate mood and affect, normal insight  Skin: warm and dry   CBC Latest Ref Rng & Units 09/05/2017 12/17/2016  Hemoglobin 13.0 - 17.0 g/dL 9.9(L)  9.2(L)  Hematocrit 39.0 - 52.0 % 29.0(L) 27.0(L)    CMP Latest Ref Rng & Units 09/05/2017 12/17/2016  Glucose 65 - 99 mg/dL 113(H) 101(H)  BUN 6 - 20 mg/dL 41(H) -  Creatinine 0.61 - 1.24 mg/dL 11.40(H) -  Sodium 135 - 145 mmol/L 142 144  Potassium 3.5 - 5.1 mmol/L 4.5 4.4  Chloride 101 - 111 mmol/L 101 -    No results found for: INR, PROTIME  Imaging: No results found.   A/P: 63 year old gentleman with end-stage renal disease on dialysis, desires peritoneal dialysis access  We discussed the laparoscopic technique  and the risks of bleeding, infection, pain, scarring, injury to intra-abdominal organs, catheter malfunction, infection or occlusion and possible need for revision or removal in the future.  Discussed risks of general anesthesia including heart attack, pneumonia, stroke, blood clots, death. Discussed ongoing follow up with the dialysis nurse and a 6-8 week period of healing and education prior to the catheter being able to be used. Questions were welcomed and answered. He is eager to proceed. He will need cardiac clearance prior to surgery.   Romana Juniper, MD [facility] Surgery, [place] Pager [phone]

## 2017-10-18 DIAGNOSIS — N2581 Secondary hyperparathyroidism of renal origin: Secondary | ICD-10-CM | POA: Diagnosis not present

## 2017-10-18 DIAGNOSIS — Z992 Dependence on renal dialysis: Secondary | ICD-10-CM | POA: Diagnosis not present

## 2017-10-18 DIAGNOSIS — D631 Anemia in chronic kidney disease: Secondary | ICD-10-CM | POA: Diagnosis not present

## 2017-10-18 DIAGNOSIS — D509 Iron deficiency anemia, unspecified: Secondary | ICD-10-CM | POA: Diagnosis not present

## 2017-10-18 DIAGNOSIS — N186 End stage renal disease: Secondary | ICD-10-CM | POA: Diagnosis not present

## 2017-10-19 DIAGNOSIS — Z992 Dependence on renal dialysis: Secondary | ICD-10-CM | POA: Diagnosis not present

## 2017-10-19 DIAGNOSIS — N186 End stage renal disease: Secondary | ICD-10-CM | POA: Diagnosis not present

## 2017-10-21 DIAGNOSIS — N2581 Secondary hyperparathyroidism of renal origin: Secondary | ICD-10-CM | POA: Diagnosis not present

## 2017-10-21 DIAGNOSIS — N186 End stage renal disease: Secondary | ICD-10-CM | POA: Diagnosis not present

## 2017-10-21 DIAGNOSIS — D509 Iron deficiency anemia, unspecified: Secondary | ICD-10-CM | POA: Diagnosis not present

## 2017-10-21 DIAGNOSIS — D631 Anemia in chronic kidney disease: Secondary | ICD-10-CM | POA: Diagnosis not present

## 2017-10-21 DIAGNOSIS — Z992 Dependence on renal dialysis: Secondary | ICD-10-CM | POA: Diagnosis not present

## 2017-10-23 DIAGNOSIS — N2581 Secondary hyperparathyroidism of renal origin: Secondary | ICD-10-CM | POA: Diagnosis not present

## 2017-10-23 DIAGNOSIS — D509 Iron deficiency anemia, unspecified: Secondary | ICD-10-CM | POA: Diagnosis not present

## 2017-10-23 DIAGNOSIS — D631 Anemia in chronic kidney disease: Secondary | ICD-10-CM | POA: Diagnosis not present

## 2017-10-23 DIAGNOSIS — Z992 Dependence on renal dialysis: Secondary | ICD-10-CM | POA: Diagnosis not present

## 2017-10-23 DIAGNOSIS — N186 End stage renal disease: Secondary | ICD-10-CM | POA: Diagnosis not present

## 2017-10-25 DIAGNOSIS — Z992 Dependence on renal dialysis: Secondary | ICD-10-CM | POA: Diagnosis not present

## 2017-10-25 DIAGNOSIS — D509 Iron deficiency anemia, unspecified: Secondary | ICD-10-CM | POA: Diagnosis not present

## 2017-10-25 DIAGNOSIS — D631 Anemia in chronic kidney disease: Secondary | ICD-10-CM | POA: Diagnosis not present

## 2017-10-25 DIAGNOSIS — N186 End stage renal disease: Secondary | ICD-10-CM | POA: Diagnosis not present

## 2017-10-25 DIAGNOSIS — N2581 Secondary hyperparathyroidism of renal origin: Secondary | ICD-10-CM | POA: Diagnosis not present

## 2017-10-28 DIAGNOSIS — N186 End stage renal disease: Secondary | ICD-10-CM | POA: Diagnosis not present

## 2017-10-28 DIAGNOSIS — Z992 Dependence on renal dialysis: Secondary | ICD-10-CM | POA: Diagnosis not present

## 2017-10-28 DIAGNOSIS — D509 Iron deficiency anemia, unspecified: Secondary | ICD-10-CM | POA: Diagnosis not present

## 2017-10-28 DIAGNOSIS — D631 Anemia in chronic kidney disease: Secondary | ICD-10-CM | POA: Diagnosis not present

## 2017-10-28 DIAGNOSIS — N2581 Secondary hyperparathyroidism of renal origin: Secondary | ICD-10-CM | POA: Diagnosis not present

## 2017-10-28 NOTE — Pre-Procedure Instructions (Signed)
Rodney Gordon  10/28/2017      [facility] [address] [place] [address] Phone: [phone] Fax: [phone]    Your procedure is scheduled on Mon. April 15  Report to [facility] Admitting at 5:30 A.M.  Call this number if you have problems the morning of surgery:  [phone]   Remember:  Do not eat food or drink liquids after midnight on Sun. April 14   Take these medicines the morning of surgery with A SIP OF WATER : tylenol if needed,amlodipine (norvasc), atenolol (tenormin), nitroglycerine if needed doxazosin(cardura),              7 days prior to surgery STOP taking any Aspirin(unless otherwise instructed by your surgeon), Aleve, Naproxen, Ibuprofen, Motrin, Advil, Goody's, BC's, all herbal medications, fish oil, and all vitamins                   Follow your doctors instructions regarding your Aspirin.  If no instructions were given by your doctor, then you will need to call the prescribing office office to get instructions.     Do not wear jewelry.  Do not wear lotions, powders, or perfumes, or deodorant.  Do not shave 48 hours prior to surgery.  Men may shave face and neck.  Do not bring valuables to the hospital.  [facility] is not responsible for any belongings or valuables.  Contacts, dentures or bridgework may not be worn into surgery.  Leave your suitcase in the car.  After surgery it may be brought to your room.  For patients admitted to the hospital, discharge time will be determined by your treatment team.  Patients discharged the day of surgery will not be allowed to drive home.    Special instructions:  [place]- Preparing For Surgery  Before surgery, you can play an important role. Because skin is not sterile, your skin needs to be as free of germs as possible. You can reduce the number of germs on your skin by washing with CHG (chlorahexidine gluconate) Soap before surgery.  CHG is an  antiseptic cleaner which kills germs and bonds with the skin to continue killing germs even after washing.  Please do not use if you have an allergy to CHG or antibacterial soaps. If your skin becomes reddened/irritated stop using the CHG.  Do not shave (including legs and underarms) for at least 48 hours prior to first CHG shower. It is OK to shave your face.  Please follow these instructions carefully.   1. Shower the NIGHT BEFORE SURGERY and the MORNING OF SURGERY with CHG.   2. If you chose to wash your hair, wash your hair first as usual with your normal shampoo.  3. After you shampoo, rinse your hair and body thoroughly to remove the shampoo.  4. Use CHG as you would any other liquid soap. You can apply CHG directly to the skin and wash gently with a scrungie or a clean washcloth.   5. Apply the CHG Soap to your body ONLY FROM THE NECK DOWN.  Do not use on open wounds or open sores. Avoid contact with your eyes, ears, mouth and genitals (private parts). Wash Face and genitals (private parts)  with your normal soap.  6. Wash thoroughly, paying special attention to the area where your surgery will be performed.  7. Thoroughly rinse your body with warm water from the neck down.  8. DO NOT shower/wash with your normal soap after using and rinsing off the CHG Soap.  9. Pat yourself dry with a CLEAN TOWEL.  10. Wear CLEAN PAJAMAS to bed the night before surgery, wear comfortable clothes the morning of surgery  11. Place CLEAN SHEETS on your bed the night of your first shower and DO NOT SLEEP WITH PETS.    Day of Surgery: Do not apply any deodorants/lotions. Please wear clean clothes to the hospital/surgery center.      Please read over the following fact sheets that you were given. Coughing and Deep Breathing and Surgical Site Infection Prevention

## 2017-10-29 ENCOUNTER — Other Ambulatory Visit: Payer: Self-pay

## 2017-10-29 ENCOUNTER — Encounter (HOSPITAL_COMMUNITY)
Admission: RE | Admit: 2017-10-29 | Discharge: 2017-10-29 | Disposition: A | Discharge status: Home / Self Care | Payer: Medicare Other | Source: Ambulatory Visit | Attending: Surgery | Admitting: Surgery

## 2017-10-29 ENCOUNTER — Encounter (HOSPITAL_COMMUNITY): Payer: Self-pay

## 2017-10-29 DIAGNOSIS — Z01810 Encounter for preprocedural cardiovascular examination: Secondary | ICD-10-CM

## 2017-10-29 DIAGNOSIS — N186 End stage renal disease: Secondary | ICD-10-CM | POA: Insufficient documentation

## 2017-10-29 DIAGNOSIS — R001 Bradycardia, unspecified: Secondary | ICD-10-CM | POA: Insufficient documentation

## 2017-10-29 DIAGNOSIS — R9431 Abnormal electrocardiogram [ECG] [EKG]: Secondary | ICD-10-CM | POA: Diagnosis not present

## 2017-10-29 DIAGNOSIS — Z01818 Encounter for other preprocedural examination: Secondary | ICD-10-CM | POA: Diagnosis not present

## 2017-10-29 LAB — BASIC METABOLIC PANEL
ANION GAP: 17 — AB (ref 5–15)
BUN: 32 mg/dL — ABNORMAL HIGH (ref 6–20)
CO2: 25 mmol/L (ref 22–32)
Calcium: 9.4 mg/dL (ref 8.9–10.3)
Chloride: 97 mmol/L — ABNORMAL LOW (ref 101–111)
Creatinine, Ser: 9.82 mg/dL — ABNORMAL HIGH (ref 0.61–1.24)
GFR calc Af Amer: 6 mL/min — ABNORMAL LOW (ref >60)
GFR, EST NON AFRICAN AMERICAN: 5 mL/min — AB (ref >60)
Glucose, Bld: 110 mg/dL — ABNORMAL HIGH (ref 65–99)
POTASSIUM: 4.1 mmol/L (ref 3.5–5.1)
Sodium: 139 mmol/L (ref 135–145)

## 2017-10-29 LAB — CBC WITH DIFFERENTIAL/PLATELET
BASOS ABS: 0 10*3/uL (ref 0.0–0.1)
Basophils Relative: 0 %
Eosinophils Absolute: 0.1 10*3/uL (ref 0.0–0.7)
Eosinophils Relative: 1 %
HEMATOCRIT: 35.9 % — AB (ref 39.0–52.0)
HEMOGLOBIN: 11.9 g/dL — AB (ref 13.0–17.0)
LYMPHS PCT: 34 %
Lymphs Abs: 3 10*3/uL (ref 0.7–4.0)
MCH: 35.5 pg — ABNORMAL HIGH (ref 26.0–34.0)
MCHC: 33.1 g/dL (ref 30.0–36.0)
MCV: 107.2 fL — AB (ref 78.0–100.0)
MONO ABS: 0.7 10*3/uL (ref 0.1–1.0)
MONOS PCT: 8 %
NEUTROS ABS: 5.1 10*3/uL (ref 1.7–7.7)
Neutrophils Relative %: 57 %
Platelets: 271 10*3/uL (ref 150–400)
RBC: 3.35 MIL/uL — ABNORMAL LOW (ref 4.22–5.81)
RDW: 13.6 % (ref 11.5–15.5)
WBC: 8.9 10*3/uL (ref 4.0–10.5)

## 2017-10-29 MED ORDER — CHLORHEXIDINE GLUCONATE 4 % EX LIQD: 60.0000 mL | Freq: Once | CUTANEOUS | Status: DC

## 2017-10-29 NOTE — Progress Notes (Addendum)
FMB:WGYK Hasanaj, MD  Cardiologist: Rozann Lesches, MD  EKG: obtained today  Stress test: 7 year ago  ECHO: 12/10/16 in EPIC  Cardiac Cath: pt denies ever  Chest x-Maziarz: obtained today per order  Pt instructed to continue aspirin 81 mg up until day of surgery

## 2017-10-30 DIAGNOSIS — Z992 Dependence on renal dialysis: Secondary | ICD-10-CM | POA: Diagnosis not present

## 2017-10-30 DIAGNOSIS — D631 Anemia in chronic kidney disease: Secondary | ICD-10-CM | POA: Diagnosis not present

## 2017-10-30 DIAGNOSIS — D509 Iron deficiency anemia, unspecified: Secondary | ICD-10-CM | POA: Diagnosis not present

## 2017-10-30 DIAGNOSIS — N186 End stage renal disease: Secondary | ICD-10-CM | POA: Diagnosis not present

## 2017-10-30 DIAGNOSIS — N2581 Secondary hyperparathyroidism of renal origin: Secondary | ICD-10-CM | POA: Diagnosis not present

## 2017-10-30 NOTE — Progress Notes (Signed)
Anesthesia Chart Review: Patient is a 62 year old male scheduled for laparoscopic insertion of continuous ambulatory peritoneal dialysis on 11/03/17 by Dr. Romana Juniper.  History includes former smoker (quit '00), aortic regurgitation (moderate AR 11/2016), CAD (non-obstructive, '04), anemia of chronic, PAD, ESRD (currently hemodialysis TTS, [place]; has right brachiocephalic AVF), prostate surgery.   - PCP is Dr. Stoney Bang. - Cardiologist is Dr. Rozann Lesches. Last visit 06/06/17. Echo planned ~ 11/2017.  - Nephrologist is Dr. Fran Lowes.   Meds include amlodipine, aspirin 81 mg (continuing up until day of surgery), atenolol, PhosLo, doxazosin, nitro, sodium bicarbonate.  BP (!) 168/66   Pulse (!) 57   Temp 36.8 C   Resp 20   Ht 6\' 1"  (1.854 m)   Wt 168 lb (76.2 kg)   SpO2 100%   BMI 22.16 kg/m   EKG 18-Nov-2017: SB at 53 bpm, moderate voltage criteria for LVH, may be normal variant with repolarization abnormality. ST/T wave abnormality, consider lateral ischemia. No significant change since 06/11/16 tracing.   Echo 12/10/16: Impressions: - Mild LVH with mild LV chamber dilatation and LVEF 50-55%. There   is diffuse hypokinesis. Grade 2 diastolic dysfunction with   increased LV filling pressure. Severe left atrial enlargement.   Mildly calcified mitral annulus with mild mitral regurgitation.   Mildly thickened aortic leaflets with calcified annulus and   overall moderate aortic regurgitation. Mild tricuspid   regurgitation with PASP estimated 56 mmHg. Small circumferential   pericardial effusion. (Dr. Domenic Polite reviewed and felt results were overall stable.)  Cardiac cath 11/26/02:  - LM: Large caliber vessel, short in its extent, angiographically normal. - LAD: Large caliber vessel and provides a major first diagonal branch in the proximal segment.  The LAD extends to the apex.  The native LAD has mild irregularities.  The diagonal branch has an ostial narrowing of 70%. - LCX:  Large caliber vessel and provides 2 small marginal branches in the proximal segment and a large third marginal branch distally. The LCX system has mild irregularities. - RCA: RCA is dominant. This is a medium-caliber vessel that provides the PDA and posterior ventricular branch in its terminal segment. RCA has 30% proximal stenosis.There is an eccentric plaque of 30% in the mid section after an RV marginal branch. - LV:   Normal end-systolic and end-diastolic dimensions. Overall left ventricular function is well preserved.  Ejection fraction is greater than 55%.  No mitral regurgitation.  LV pressure is 160/5, aortic is 160/90.  LVEDP equals 10. - Aortic Root: The ascending aortic root above the sinus of Valsalva is mildly dilated.  There is 1+ aortic insufficiency. Plan: 70% narrowing in DAIG1 branch that appears to be 2.5 mm in diameter. Treatment options discussed with plans to proceed with medical therapy unless persistent or worsening symptoms. Mild AI can be followed by serial echocardiograms. Further assessment of mildly dilated aortic root with imaging studies may be beneficial.   CXR 11-18-17: IMPRESSION: No active cardiopulmonary disease.  Preoperative labs noted. He will get an ISTAT4 on the day of surgery.   Patient with stable EKG. He saw cardiology within the past six months. Based on currently available information then I would anticipate that he can proceed as planned if no acute changes and ISTAT results acceptable. Anesthesiology to evaluate on the day of surgery.  George Hugh [facility] Short Stay [facility]/Anesthesiology Phone [phone] 10/30/2017 1:44 PM

## 2017-11-01 DIAGNOSIS — D509 Iron deficiency anemia, unspecified: Secondary | ICD-10-CM | POA: Diagnosis not present

## 2017-11-01 DIAGNOSIS — D631 Anemia in chronic kidney disease: Secondary | ICD-10-CM | POA: Diagnosis not present

## 2017-11-01 DIAGNOSIS — N2581 Secondary hyperparathyroidism of renal origin: Secondary | ICD-10-CM | POA: Diagnosis not present

## 2017-11-01 DIAGNOSIS — N186 End stage renal disease: Secondary | ICD-10-CM | POA: Diagnosis not present

## 2017-11-01 DIAGNOSIS — Z992 Dependence on renal dialysis: Secondary | ICD-10-CM | POA: Diagnosis not present

## 2017-11-02 NOTE — Anesthesia Preprocedure Evaluation (Addendum)
Anesthesia Evaluation  Patient identified by MRN, date of birth, ID band Patient awake    Reviewed: Allergy & Precautions, NPO status , Patient's Chart, lab work & pertinent test results, reviewed documented beta blocker date and time   History of Anesthesia Complications Negative for: history of anesthetic complications  Airway Mallampati: II  TM Distance: >3 FB Neck ROM: Full    Dental  (+) Poor Dentition, Chipped, Dental Advisory Given   Pulmonary neg pulmonary ROS, former smoker (quit 2000),    breath sounds clear to auscultation       Cardiovascular hypertension, Pt. on medications and Pt. on home beta blockers + CAD (non-obstructive) and + Peripheral Vascular Disease  + Valvular Problems/Murmurs AI  Rhythm:Regular Rate:Normal  5/18 ECHO: EF 50-55%, mod AI, mild MR, mild TR   Neuro/Psych negative neurological ROS     GI/Hepatic Neg liver ROS, GERD  Controlled,  Endo/Other  negative endocrine ROS  Renal/GU ESRF and DialysisRenal disease (K+ 4.4)     Musculoskeletal   Abdominal   Peds  Hematology negative hematology ROS (+)   Anesthesia Other Findings   Reproductive/Obstetrics                            Anesthesia Physical Anesthesia Plan  ASA: III  Anesthesia Plan: General   Post-op Pain Management:    Induction: Intravenous  PONV Risk Score and Plan: 2 and Ondansetron and Dexamethasone  Airway Management Planned: Oral ETT  Additional Equipment:   Intra-op Plan:   Post-operative Plan: Extubation in OR  Informed Consent: I have reviewed the patients History and Physical, chart, labs and discussed the procedure including the risks, benefits and alternatives for the proposed anesthesia with the patient or authorized representative who has indicated his/her understanding and acceptance.   Dental advisory given  Plan Discussed with: CRNA and Surgeon  Anesthesia Plan  Comments: (Plan routine monitors, GETA)        Anesthesia Quick Evaluation

## 2017-11-03 ENCOUNTER — Encounter (HOSPITAL_COMMUNITY): Payer: Self-pay | Admitting: *Deleted

## 2017-11-03 ENCOUNTER — Ambulatory Visit (HOSPITAL_COMMUNITY): Payer: Medicare Other | Admitting: Vascular Surgery

## 2017-11-03 ENCOUNTER — Encounter (HOSPITAL_COMMUNITY)
Admission: RE | Disposition: A | Discharge status: Home / Self Care | Payer: Self-pay | Source: Ambulatory Visit | Attending: Surgery

## 2017-11-03 ENCOUNTER — Ambulatory Visit (HOSPITAL_COMMUNITY)
Admission: RE | Admit: 2017-11-03 | Discharge: 2017-11-03 | Disposition: A | Discharge status: Home / Self Care | Payer: Medicare Other | Source: Ambulatory Visit | Attending: Surgery | Admitting: Surgery

## 2017-11-03 ENCOUNTER — Ambulatory Visit (HOSPITAL_COMMUNITY): Payer: Medicare Other | Admitting: Anesthesiology

## 2017-11-03 DIAGNOSIS — Z79899 Other long term (current) drug therapy: Secondary | ICD-10-CM | POA: Insufficient documentation

## 2017-11-03 DIAGNOSIS — I739 Peripheral vascular disease, unspecified: Secondary | ICD-10-CM | POA: Insufficient documentation

## 2017-11-03 DIAGNOSIS — I120 Hypertensive chronic kidney disease with stage 5 chronic kidney disease or end stage renal disease: Secondary | ICD-10-CM | POA: Insufficient documentation

## 2017-11-03 DIAGNOSIS — R002 Palpitations: Secondary | ICD-10-CM | POA: Diagnosis not present

## 2017-11-03 DIAGNOSIS — I351 Nonrheumatic aortic (valve) insufficiency: Secondary | ICD-10-CM | POA: Diagnosis not present

## 2017-11-03 DIAGNOSIS — Z809 Family history of malignant neoplasm, unspecified: Secondary | ICD-10-CM | POA: Diagnosis not present

## 2017-11-03 DIAGNOSIS — Z87891 Personal history of nicotine dependence: Secondary | ICD-10-CM | POA: Diagnosis not present

## 2017-11-03 DIAGNOSIS — D631 Anemia in chronic kidney disease: Secondary | ICD-10-CM | POA: Diagnosis not present

## 2017-11-03 DIAGNOSIS — I2510 Atherosclerotic heart disease of native coronary artery without angina pectoris: Secondary | ICD-10-CM | POA: Diagnosis not present

## 2017-11-03 DIAGNOSIS — Z992 Dependence on renal dialysis: Secondary | ICD-10-CM | POA: Diagnosis not present

## 2017-11-03 DIAGNOSIS — Z7982 Long term (current) use of aspirin: Secondary | ICD-10-CM | POA: Diagnosis not present

## 2017-11-03 DIAGNOSIS — N186 End stage renal disease: Secondary | ICD-10-CM | POA: Insufficient documentation

## 2017-11-03 DIAGNOSIS — Z8249 Family history of ischemic heart disease and other diseases of the circulatory system: Secondary | ICD-10-CM | POA: Diagnosis not present

## 2017-11-03 HISTORY — PX: CAPD INSERTION: SHX5233

## 2017-11-03 LAB — POCT I-STAT 4, (NA,K, GLUC, HGB,HCT)
GLUCOSE: 123 mg/dL — AB (ref 65–99)
HEMATOCRIT: 34 % — AB (ref 39.0–52.0)
Hemoglobin: 11.6 g/dL — ABNORMAL LOW (ref 13.0–17.0)
POTASSIUM: 4.4 mmol/L (ref 3.5–5.1)
Sodium: 139 mmol/L (ref 135–145)

## 2017-11-03 SURGERY — LAPAROSCOPIC INSERTION CONTINUOUS AMBULATORY PERITONEAL DIALYSIS  (CAPD) CATHETER
Anesthesia: General | Site: Abdomen

## 2017-11-03 MED ORDER — ACETAMINOPHEN 500 MG PO TABS
1000.0000 mg | ORAL_TABLET | ORAL | Status: AC
  Administered 2017-11-03: 1000 mg via ORAL
  Filled 2017-11-03: qty 2

## 2017-11-03 MED ORDER — OXYCODONE-ACETAMINOPHEN 5-325 MG PO TABS: 1.0000 | ORAL_TABLET | Freq: Four times a day (QID) | ORAL | 0 refills | Status: DC | PRN

## 2017-11-03 MED ORDER — SUCCINYLCHOLINE CHLORIDE 200 MG/10ML IV SOSY
PREFILLED_SYRINGE | INTRAVENOUS | Status: AC
  Filled 2017-11-03: qty 10

## 2017-11-03 MED ORDER — ONDANSETRON HCL 4 MG/2ML IJ SOLN
INTRAMUSCULAR | Status: AC
  Filled 2017-11-03: qty 2

## 2017-11-03 MED ORDER — DEXAMETHASONE SODIUM PHOSPHATE 10 MG/ML IJ SOLN
INTRAMUSCULAR | Status: AC
  Filled 2017-11-03: qty 1

## 2017-11-03 MED ORDER — MIDAZOLAM HCL 2 MG/2ML IJ SOLN: 0.5000 mg | Freq: Once | INTRAMUSCULAR | Status: DC | PRN

## 2017-11-03 MED ORDER — BUPIVACAINE-EPINEPHRINE 0.25% -1:200000 IJ SOLN
INTRAMUSCULAR | Status: DC | PRN
  Administered 2017-11-03: 5 mL

## 2017-11-03 MED ORDER — EPHEDRINE SULFATE-NACL 50-0.9 MG/10ML-% IV SOSY
PREFILLED_SYRINGE | INTRAVENOUS | Status: DC | PRN
  Administered 2017-11-03 (×3): 5 mg via INTRAVENOUS

## 2017-11-03 MED ORDER — FENTANYL CITRATE (PF) 100 MCG/2ML IJ SOLN: 25.0000 ug | INTRAMUSCULAR | Status: DC | PRN

## 2017-11-03 MED ORDER — OXYCODONE HCL 5 MG PO TABS: 5.0000 mg | ORAL_TABLET | ORAL | Status: DC | PRN

## 2017-11-03 MED ORDER — MIDAZOLAM HCL 2 MG/2ML IJ SOLN
INTRAMUSCULAR | Status: AC
Start: 2017-11-03 — End: 2017-11-03
  Filled 2017-11-03: qty 2

## 2017-11-03 MED ORDER — PHENYLEPHRINE 40 MCG/ML (10ML) SYRINGE FOR IV PUSH (FOR BLOOD PRESSURE SUPPORT)
PREFILLED_SYRINGE | INTRAVENOUS | Status: AC
  Filled 2017-11-03: qty 10

## 2017-11-03 MED ORDER — CEFAZOLIN SODIUM-DEXTROSE 2-4 GM/100ML-% IV SOLN
2.0000 g | INTRAVENOUS | Status: AC
  Administered 2017-11-03: 2 g via INTRAVENOUS
  Filled 2017-11-03: qty 100

## 2017-11-03 MED ORDER — SODIUM CHLORIDE 0.9 % IR SOLN
Status: DC | PRN
  Administered 2017-11-03: 1

## 2017-11-03 MED ORDER — ACETAMINOPHEN 650 MG RE SUPP: 650.0000 mg | RECTAL | Status: DC | PRN

## 2017-11-03 MED ORDER — PROPOFOL 10 MG/ML IV BOLUS
INTRAVENOUS | Status: DC | PRN
  Administered 2017-11-03: 100 mg via INTRAVENOUS

## 2017-11-03 MED ORDER — SODIUM CHLORIDE 0.9% FLUSH: 3.0000 mL | Freq: Two times a day (BID) | INTRAVENOUS | Status: DC

## 2017-11-03 MED ORDER — SODIUM CHLORIDE 0.9 % IV SOLN
INTRAVENOUS | Status: DC | PRN
  Administered 2017-11-03: 07:00:00

## 2017-11-03 MED ORDER — DOCUSATE SODIUM 100 MG PO CAPS: 100.0000 mg | ORAL_CAPSULE | Freq: Two times a day (BID) | ORAL | 0 refills | Status: AC

## 2017-11-03 MED ORDER — GABAPENTIN 300 MG PO CAPS
300.0000 mg | ORAL_CAPSULE | ORAL | Status: AC
  Administered 2017-11-03: 300 mg via ORAL
  Filled 2017-11-03: qty 1

## 2017-11-03 MED ORDER — EPHEDRINE 5 MG/ML INJ
INTRAVENOUS | Status: AC
  Filled 2017-11-03: qty 10

## 2017-11-03 MED ORDER — SODIUM CHLORIDE 0.9% FLUSH: 3.0000 mL | INTRAVENOUS | Status: DC | PRN

## 2017-11-03 MED ORDER — NEOSTIGMINE METHYLSULFATE 10 MG/10ML IV SOLN
INTRAVENOUS | Status: DC | PRN
  Administered 2017-11-03: 3 mg via INTRAVENOUS

## 2017-11-03 MED ORDER — FENTANYL CITRATE (PF) 100 MCG/2ML IJ SOLN
INTRAMUSCULAR | Status: DC | PRN
  Administered 2017-11-03 (×2): 50 ug via INTRAVENOUS

## 2017-11-03 MED ORDER — ONDANSETRON HCL 4 MG/2ML IJ SOLN
INTRAMUSCULAR | Status: DC | PRN
  Administered 2017-11-03: 4 mg via INTRAVENOUS

## 2017-11-03 MED ORDER — DEXAMETHASONE SODIUM PHOSPHATE 10 MG/ML IJ SOLN
INTRAMUSCULAR | Status: DC | PRN
  Administered 2017-11-03: 10 mg via INTRAVENOUS

## 2017-11-03 MED ORDER — PHENYLEPHRINE 40 MCG/ML (10ML) SYRINGE FOR IV PUSH (FOR BLOOD PRESSURE SUPPORT)
PREFILLED_SYRINGE | INTRAVENOUS | Status: DC | PRN
  Administered 2017-11-03: 80 ug via INTRAVENOUS
  Administered 2017-11-03 (×2): 120 ug via INTRAVENOUS
  Administered 2017-11-03: 80 ug via INTRAVENOUS

## 2017-11-03 MED ORDER — MIDAZOLAM HCL 5 MG/5ML IJ SOLN
INTRAMUSCULAR | Status: DC | PRN
  Administered 2017-11-03: 2 mg via INTRAVENOUS

## 2017-11-03 MED ORDER — GLYCOPYRROLATE 0.2 MG/ML IJ SOLN
INTRAMUSCULAR | Status: DC | PRN
  Administered 2017-11-03: 0.4 mg via INTRAVENOUS

## 2017-11-03 MED ORDER — ACETAMINOPHEN 325 MG PO TABS: 650.0000 mg | ORAL_TABLET | ORAL | Status: DC | PRN

## 2017-11-03 MED ORDER — CELECOXIB 200 MG PO CAPS
ORAL_CAPSULE | ORAL | Status: AC
  Administered 2017-11-03: 200 mg
  Filled 2017-11-03: qty 1

## 2017-11-03 MED ORDER — NEOSTIGMINE METHYLSULFATE 5 MG/5ML IV SOSY
PREFILLED_SYRINGE | INTRAVENOUS | Status: AC
  Filled 2017-11-03: qty 5

## 2017-11-03 MED ORDER — LIDOCAINE 2% (20 MG/ML) 5 ML SYRINGE
INTRAMUSCULAR | Status: DC | PRN
  Administered 2017-11-03: 40 mg via INTRAVENOUS

## 2017-11-03 MED ORDER — FENTANYL CITRATE (PF) 250 MCG/5ML IJ SOLN
INTRAMUSCULAR | Status: AC
  Filled 2017-11-03: qty 5

## 2017-11-03 MED ORDER — PROMETHAZINE HCL 25 MG/ML IJ SOLN: 6.2500 mg | INTRAMUSCULAR | Status: DC | PRN

## 2017-11-03 MED ORDER — SODIUM CHLORIDE 0.9 % IV SOLN: 250.0000 mL | INTRAVENOUS | Status: DC | PRN

## 2017-11-03 MED ORDER — SODIUM CHLORIDE 0.9 % IV SOLN
INTRAVENOUS | Status: DC | PRN
  Administered 2017-11-03: 07:00:00 via INTRAVENOUS

## 2017-11-03 MED ORDER — MEPERIDINE HCL 50 MG/ML IJ SOLN: 6.2500 mg | INTRAMUSCULAR | Status: DC | PRN

## 2017-11-03 MED ORDER — PROPOFOL 10 MG/ML IV BOLUS
INTRAVENOUS | Status: AC
  Filled 2017-11-03: qty 20

## 2017-11-03 MED ORDER — SODIUM CHLORIDE 0.9 % IV SOLN
INTRAVENOUS | Status: AC
  Filled 2017-11-03: qty 1.2

## 2017-11-03 MED ORDER — LIDOCAINE 2% (20 MG/ML) 5 ML SYRINGE
INTRAMUSCULAR | Status: AC
  Filled 2017-11-03: qty 5

## 2017-11-03 MED ORDER — CISATRACURIUM BESYLATE (PF) 10 MG/5ML IV SOLN
INTRAVENOUS | Status: DC | PRN
  Administered 2017-11-03: 16 mg via INTRAVENOUS

## 2017-11-03 MED ORDER — BUPIVACAINE-EPINEPHRINE (PF) 0.25% -1:200000 IJ SOLN
INTRAMUSCULAR | Status: AC
  Filled 2017-11-03: qty 30

## 2017-11-03 MED ORDER — CELECOXIB 200 MG PO CAPS: 200.0000 mg | ORAL_CAPSULE | ORAL | Status: DC

## 2017-11-03 SURGICAL SUPPLY — 38 items
ADAPTER TITANIUM MEDIONICS (MISCELLANEOUS) ×2 IMPLANT
BAG DECANTER FOR FLEXI CONT (MISCELLANEOUS) ×2 IMPLANT
BLADE CLIPPER SURG (BLADE) IMPLANT
CANISTER SUCT 3000ML PPV (MISCELLANEOUS) IMPLANT
CATH EXTENDED DIALYSIS (CATHETERS) ×2 IMPLANT
CHLORAPREP W/TINT 26ML (MISCELLANEOUS) ×2 IMPLANT
COVER SURGICAL LIGHT HANDLE (MISCELLANEOUS) ×2 IMPLANT
DECANTER SPIKE VIAL GLASS SM (MISCELLANEOUS) ×2 IMPLANT
DERMABOND ADVANCED (GAUZE/BANDAGES/DRESSINGS) ×1
DERMABOND ADVANCED .7 DNX12 (GAUZE/BANDAGES/DRESSINGS) ×1 IMPLANT
DEVICE PMI PUNCTURE CLOSURE (MISCELLANEOUS) ×2 IMPLANT
DRSG TEGADERM 4X4.75 (GAUZE/BANDAGES/DRESSINGS) ×6 IMPLANT
ELECT REM PT RETURN 9FT ADLT (ELECTROSURGICAL) ×2
ELECTRODE REM PT RTRN 9FT ADLT (ELECTROSURGICAL) ×1 IMPLANT
GAUZE SPONGE 4X4 12PLY STRL (GAUZE/BANDAGES/DRESSINGS) ×2 IMPLANT
GLOVE BIO SURGEON STRL SZ 6 (GLOVE) ×2 IMPLANT
GLOVE BIOGEL PI IND STRL 6.5 (GLOVE) ×1 IMPLANT
GLOVE BIOGEL PI INDICATOR 6.5 (GLOVE) ×1
GOWN STRL REUS W/ TWL LRG LVL3 (GOWN DISPOSABLE) ×3 IMPLANT
GOWN STRL REUS W/TWL LRG LVL3 (GOWN DISPOSABLE) ×3
KIT BASIN OR (CUSTOM PROCEDURE TRAY) ×2 IMPLANT
KIT TURNOVER KIT B (KITS) ×2 IMPLANT
NS IRRIG 1000ML POUR BTL (IV SOLUTION) ×2 IMPLANT
PAD ARMBOARD 7.5X6 YLW CONV (MISCELLANEOUS) ×2 IMPLANT
SET EXT 12IN DIALYSIS STAY-SAF (MISCELLANEOUS) ×2 IMPLANT
SET IRRIG TUBING LAPAROSCOPIC (IRRIGATION / IRRIGATOR) IMPLANT
SLEEVE ENDOPATH XCEL 5M (ENDOMECHANICALS) ×2 IMPLANT
STYLET FALLER (MISCELLANEOUS) ×2 IMPLANT
SUT MNCRL AB 4-0 PS2 18 (SUTURE) ×2 IMPLANT
SUT SILK 0 TIES 10X30 (SUTURE) ×2 IMPLANT
TOWEL OR 17X24 6PK STRL BLUE (TOWEL DISPOSABLE) ×2 IMPLANT
TOWEL OR 17X26 10 PK STRL BLUE (TOWEL DISPOSABLE) ×2 IMPLANT
TRAY LAPAROSCOPIC MC (CUSTOM PROCEDURE TRAY) ×2 IMPLANT
TROCAR 5MMX150MM (TROCAR) ×2 IMPLANT
TROCAR XCEL NON-BLD 5MMX100MML (ENDOMECHANICALS) ×2 IMPLANT
TUBING CYSTO DISP (UROLOGICAL SUPPLIES) ×2 IMPLANT
TUBING INSUFFLATION (TUBING) ×2 IMPLANT
WATER STERILE IRR 1000ML POUR (IV SOLUTION) ×2 IMPLANT

## 2017-11-03 NOTE — Anesthesia Procedure Notes (Signed)
Procedure Name: Intubation Date/Time: 11/03/2017 7:39 AM Performed by: Gwyndolyn Saxon, CRNA Pre-anesthesia Checklist: Patient identified, Emergency Drugs available, Suction available, Patient being monitored and Timeout performed Patient Re-evaluated:Patient Re-evaluated prior to induction Oxygen Delivery Method: Circle system utilized Preoxygenation: Pre-oxygenation with 100% oxygen Induction Type: IV induction Ventilation: Mask ventilation without difficulty Laryngoscope Size: Miller and 2 Grade View: Grade I Tube type: Oral Tube size: 7.5 mm Number of attempts: 1 Placement Confirmation: ETT inserted through vocal cords under direct vision,  positive ETCO2,  CO2 detector and breath sounds checked- equal and bilateral Secured at: 22 cm Tube secured with: Tape Dental Injury: Teeth and Oropharynx as per pre-operative assessment

## 2017-11-03 NOTE — Op Note (Signed)
Operative Note  MATEO OVERBECK  [id number]  [id number]  11/03/2017   Surgeon: Victorino Sparrow ConnorMD  Assistant: OR staff  Procedure performed: laparoscopic insertion of tunneled peritoneal dialysis catheter, laparoscopic omentopexy  Preop diagnosis: ESRD Post-op diagnosis/intraop findings: same  Specimens: no Retained items: PD catheter EBL: minimal cc Complications: none  Description of procedure: After obtaining informed consent the patient was taken to the operating room and placed supine on operating room table wheregeneral endotracheal anesthesia was initiated, preoperative antibiotics were administered, SCDs applied, and a formal timeout was performed.  The patient's abdomen was prepped and draped in the usual sterile fashion.  Peritoneal access was gained using an Optiview technique in the left upper quadrant.  Insufflation to 15 mmHg ensued without incident.  Gross inspection revealed no intra-abdominal abnormalities or evidence of ventricular entry.  Under direct visualization and after insufflation with local and additional 5 mm trocar was placed in the left hemiabdomen.  A stab incision was made in the right upper quadrant and then a upper scopic suture passer was used to pexy the omentum to the abdominal wall in the right upper quadrant using a 0 silk suture.  The long 5 mm trocar was then introduced just to the left of the umbilicus at a 45 degree angle and tunneling somewhat in the preperitoneal space.  The abdominal portion of the catheter was placed through this trocar over the stylette and the coil formed in the pelvis.  The radiopaque stripe was intentionally directed anteriorly.  The cuff was placed within the rectus sheath and was made certain that there is no exposed cuff in the peritoneal cavity or the subcutaneous space.  The stylet and trocar were removed.  The subcutaneous component of the catheter was then brought to the field and incision made 2 fingerbreadths below  the costal margin and epigastrium where it was anticipated to tunnel.  The 2 components of the catheter were then trimmed appropriately and connected using the titanium adapter again keeping the radiopaque stripe anteriorly oriented. This was secured with 0 silk ties on either side. The Fowler tunneler was then attached to the end of the subcutaneous component and the apparatus was tunneled superiorly in the subcutaneous space to exit in the previously formed epigastric incision. This was then turned and tunneled back inferiorly within the subcutaneous tissue to exit in the left mid abdomen.  The catheter was confirmed to be appropriately oriented with no twisting or kinking.  The abdomen was reinsufflated and the catheter was inspected it was confirmed to sit flush in the pelvis with no twisting of the tubing.  Hemostasis was confirmed.  The abdomen was once again desufflated.  The external titanium adapter was applied to the end ofthecatheter RV and then the cystoscopy tubing was attached to this.  A liter of saline was instilled within the abdomen and this flowed easily.  The bag was then placed to gravity and clear fluid drained out, approximately 600 mL fluid out easily with out any evidence of obstruction of the tubing.  All the skin incisions were closed with subcuticular Monocryl and Dermabond.  The external tubing was connected to the subcutaneous component of the catheter and a dry bulky dressing was applied covering the entire catheter.  The patient was then awakened, extubated and taken to PACU in stable condition.   All counts were correct at the completion of the case.

## 2017-11-03 NOTE — Anesthesia Postprocedure Evaluation (Signed)
Anesthesia Post Note  Patient: Rodney Gordon  Procedure(s) Performed: LAPAROSCOPIC INSERTION CONTINUOUS AMBULATORY PERITONEAL DIALYSIS  (CAPD) CATHETER (N/A Abdomen)     Patient location during evaluation: PACU Anesthesia Type: General Level of consciousness: awake and alert, oriented and patient cooperative Pain management: pain level controlled Vital Signs Assessment: post-procedure vital signs reviewed and stable Respiratory status: spontaneous breathing, nonlabored ventilation and respiratory function stable Cardiovascular status: blood pressure returned to baseline and stable Postop Assessment: no apparent nausea or vomiting Anesthetic complications: no    Last Vitals:  Vitals:   11/03/17 0945 11/03/17 0955  BP: (!) 149/67 (!) 153/55  Pulse: (!) 57 (!) 52  Resp: 14 15  Temp: 36.4 C   SpO2: 100% 94%    Last Pain:  Vitals:   11/03/17 0955  TempSrc:   PainSc: 0-No pain                 Tessie Ordaz,E. Bette Brienza

## 2017-11-03 NOTE — Interval H&P Note (Signed)
History and Physical Interval Note:  11/03/2017 6:56 AM  Rodney Gordon  has presented today for surgery, with the diagnosis of end stage renal diseae  The various methods of treatment have been discussed with the patient and family. After consideration of risks, benefits and other options for treatment, the patient has consented to  Procedure(s): [place]  (CAPD) CATHETER (N/A) as a surgical intervention .  The patient's history has been reviewed, patient examined, no change in status, stable for surgery.  I have reviewed the patient's chart and labs.  Questions were answered to the patient's satisfaction.     [name] Rich Brave

## 2017-11-03 NOTE — Transfer of Care (Signed)
Immediate Anesthesia Transfer of Care Note  Patient: Rodney Gordon  Procedure(s) Performed: LAPAROSCOPIC INSERTION CONTINUOUS AMBULATORY PERITONEAL DIALYSIS  (CAPD) CATHETER (N/A Abdomen)  Patient Location: PACU  Anesthesia Type:General  Level of Consciousness: drowsy  Airway & Oxygen Therapy: Patient Spontanous Breathing and Patient connected to face mask oxygen  Post-op Assessment: Report given to RN and Post -op Vital signs reviewed and stable  Post vital signs: Reviewed and stable  Last Vitals:  Vitals Value Taken Time  BP 132/54 11/03/2017  8:45 AM  Temp    Pulse 59 11/03/2017  8:47 AM  Resp 11 11/03/2017  8:47 AM  SpO2 100 % 11/03/2017  8:47 AM  Vitals shown include unvalidated device data.  Last Pain:  Vitals:   11/03/17 0611  TempSrc: Oral      Patients Stated Pain Goal: 5 (75/19/82 4299)  Complications: No apparent anesthesia complications

## 2017-11-03 NOTE — Discharge Instructions (Signed)
PERITONEAL DIALYSIS (CAPD) CATHETER PLACEMENT: ° °POST OPERATIVE INSTRUCTIONS ° °FOLLOW UP with the Peritoneal Dialysis Nurses ° [address] [place], [place] [address] ° °Call [phone] or your neprologist to help arrange training/flushes of your CAPD catheter °  °The CAPD nurses & Nephrology usually follow you closely, making the need for follow-up in the [facility] surgery office redundant and therefore not always needed.  If they or you have concerns, please call us for possible follow-up in our office ° °1. Do NOT shower until dialysis nursing staff have advised.  Do NOT submerge in a bathtub or hot tub. °2. Your Home Therapy RN will advise and educate you on showering, bathing, and swimming when you are in training. °3. The Peritoneal Dialysis nurse will remove your waterproof bandages in the Dialysis Center a few days after surgery.  Do not remove the bandages until seen by them.  If you dressing becomes wet, saturated, or falls off call your Home Therapy RN. °4. ACTIVITIES as tolerated:   °a. You may resume regular (light) daily activities beginning the next day--such as daily self-care, walking, climbing stairs--gradually increasing activities as tolerated.  If you can walk 30 minutes without difficulty, it is safe to try more intense activity such as jogging, treadmill, bicycling, low-impact aerobics,  etc. °b. No swimming within the 1st month of catheter placement.  You must have a Dr's order. °c. Save the most intensive and strenuous activity for last such as sit-ups, heavy lifting, contact sports, etc  Refrain from any heavy lifting or straining until you are off narcotics for pain control.   °d. DO NOT PUSH THROUGH PAIN.  Let pain be your guide: If it hurts to do something, don't do it.  Pain is your body warning you to avoid that activity for another week until the pain goes down. °e. You may drive when you are no longer taking prescription pain medication, you can comfortably wear a seatbelt, and you can  safely maneuver your car and apply brakes. °f. You may have sexual intercourse when it is comfortable.  °g. Be sure your catheter is taped to Your abdomen nor injured. Did not allow catheter to ankle, or tension on the catheter-it may cause damage to skin over the catheter °h. You will be instructed on what to do an emergency, and will have access to on call our in 24 hours a day. The number to reach the home therapy nurse as listed below. °5. DIET: Follow a light bland diet the first 24 hours after arrival home, such as soup, liquids, crackers, etc.  Be sure to include lots of fluids daily.  Avoid fast food or heavy meals as your are more likely to get nauseated.   °6. Take your usually prescribed home medications unless otherwise directed. °7. PAIN CONTROL: °a. Pain is best controlled by a usual combination of three different methods TOGETHER: °i. Ice/Heat °ii. Tylenol (over the counter pain medication) °iii. Prescription pain medication °b. Most patients will experience some swelling and bruising around the incisions.  Ice packs or heating pads (30-60 minutes up to 6 times a day) will help. Use ice for the first few days to help decrease swelling and bruising, then switch to heat to help relax tight/sore spots and speed recovery.  Some people prefer to use ice alone, heat alone, alternating between ice & heat.  Experiment to what works for you.  Swelling and bruising can take several weeks to resolve.   °c. It is helpful to take   an over-the-counter pain medication regularly for the first few weeks.  Using acetaminophen (Tylenol, etc) 500-650mg four times a day (every meal & bedtime) is usually safest since NSAIDs are not advisable in patients with kidney disease. °d. A  prescription for pain medication (such as oxycodone, hydrocodone, etc) should be given to you upon discharge.  Take your pain medication as prescribed.  °i. If you are having problems/concerns with the prescription medicine (does not control pain,  nausea, vomiting, rash, itching, etc), please call us [phone] to see if we need to switch you to a different pain medicine that will work better for you and/or control your side effect better. °ii. If you need a refill on your pain medication, please contact your pharmacy.  They will contact our office to request authorization. Prescriptions will not be filled after 5 pm or on week-ends. °8. Avoid getting constipated.  Between the surgery and the pain medications, it is common to experience some constipation.  Increasing fluid intake and taking a fiber supplement (such as Metamucil, Citrucel, FiberCon, MiraLax, etc) 1-2 times a day regularly will usually help prevent this problem from occurring.  A mild laxative (prune juice, Milk of Magnesia, MiraLax, etc) should be taken according to package directions if there are no bowel movements after 48 hours.  ° ° ° ° ° °FOLLOW UP: °Peritoneal Dialysis nurses ° [address] [place], [place] [address] ° °Call [phone] or your neprologist to help arrange training/flushes of your CAPD catheter °  ° -The CAPD nurses & Nephrology usually follow you closely, making the need for follow-up in our office redundant and therefore not needed.  If they or you have concerns, please call us for possible follow-up in our office ° ° -Please call [facility] at [phone] only as needed. ° °WHEN TO CALL US [phone]: °1. Poor pain control °2. Reactions / problems with new medications (rash/itching, nausea, etc)  °3. Fever over 101.5 F (38.5 C) °4. Worsening swelling or bruising °5. Continued bleeding from incision. °6. Increased pain, redness, or drainage from the incision ° ° The clinic staff is available to answer your questions during regular business hours (8:30am-5pm).  Please don’t hesitate to call and ask to speak to one of our nurses for clinical concerns.  ° If you have a medical emergency, go to the nearest emergency room or call 911. ° A surgeon from [facility] is always on call at the hospitals ° °9. IF YOU HAVE DISABILITY OR FAMILY LEAVE FORMS, BRING THEM TO THE OFFICE FOR PROCESSING.  DO NOT GIVE THEM TO YOUR DOCTOR. ° °[facility] °[address], [address], [place], [place]  [address] ? °MAIN: [phone] ? TOLL FREE: [phone] ?  °FAX [phone] °[email or web address] ° °Peritoneal Dialysis - An Overview °Dialysis can be done using a machine outside of the body (hemodialysis). Or, it can be done inside the body (peritoneal dialysis). The word "peritoneal" refers to the lining or membrane of the belly (abdominal cavity). The peritoneal membrane is a thin, plastic-like lining inside the belly that covers the organs and fits in the abdominal or peritoneal cavity, such as the stomach, liver and the kidneys. This lining works like a filter. It will allow certain things to pass from your blood through the lining and into a special solution that has been placed into your belly. In this type of dialysis, the peritoneum is used to help clean the blood.  °If you need dialysis, your   kidneys are not working right. Healthy kidneys take out extra water and waste products, which becomes urine. When the kidneys do not do this, serious problems can develop. The waste and water build up in the blood. Your hands and feet might swell. You may feel tired, weak or sick to your stomach. Also, your blood pressure may rise. If not treated, you could die. Dialysis is a treatment that does the work that your kidneys would do if they were healthy. °· It cleans your blood.  °· It will make sure your body has the right amount of certain chemicals that it needs. They include potassium, sodium and bicarbonate.  °· It will help control your blood pressure.  °UNDERSTANDING PERITONEAL DIALYSIS °· Here is how peritoneal dialysis works:  °· First, you will have surgery to put a soft plastic tube (catheter) into your belly (abdomen). This will allow you  to easily connect yourself to special tubing, which will then let a special dialysis solution to be placed into your abdomen.  °· For each treatment, you will need at least one bag of dialysis solution (a liquid called dialysate). It is a mix of water that is pure and free of germs (sterile), sugar (dextrose) and the nutrients and minerals found in your blood. Sometimes, more than one bag is needed to get the right amount of fluid for your abdomen. Your caregiver will explain what size and how many bags you will need.  °· The dialysate is slowly put through the catheter to fill the abdomen (called the peritoneal cavity). This dialysate will need to stay in your body for 3-4 hours. This is known as the dwell time.  °· The solution is working to clean the blood and remove wastes from your body. At the end of this time, the solution is drained from your body through tubing into an empty bag. It is then replaced with a fresh dialysate.  °· The draining and replacing of the dialysate is called an exchange or cycle. The catheter is capped after each exchange. Once the solution is in your body, you are then free to do whatever you would like until the next exchange. Most people will need to do 4-5 exchanges each day.  °· There are two different methods that can be used.  °· Continuous ambulatory peritoneal dialysis (CAPD): You put the solution into your abdomen, cap your catheter and then go about your day. Several hours later, you reconnect to a tubing set up, drain out the solution and then put more solution in. This is done several times a day. No machine is needed.  °· Continuous cycler-assisted peritoneal dialysis (CCPD): A machine is used, which fills the abdomen with dialysate and then drains it. This happens several times. It usually is done at night while you are sleeping. When you wake up, you can disconnect from the machine and are free to go to go about your day.  °PREPARING FOR EXCHANGES °· Discuss the details  of the procedure with your caregivers. You will be working with a nurse who is specially trained in doing dialysis. Make sure you understand:  °· How to do an exchange.  °· How much solution you need.  °· What type of solution you will need.  °· How often you should do an exchange. Ask:  °· How many times each day?  °· When? At meals? At bedtime?  °· Always keep the dialysate bags and other supplies in a cool, clean and dry place.  °·   Keeping everything clean is very important.  °· The catheter and its cap must be free from germs (sterile)  °· The adapter also must be sterile. It attaches the dialysis bag and tubing to the catheter.  °· Clean the area of your body around the catheter every day. Use a chemical that fights infection (antiseptic).  °· Wash your hands thoroughly before starting an exchange.  °· You may be taught to wear a mask to cover your nose and mouth. This makes infection less likely to happen.  °· You may be taught to close doors, windows and turn off any fans before doing an exchange.  °· Check the dialysate bag very carefully.  °· Make sure it is the right size bag for you. This information is on the label.  °· Also, make sure it is the right mixture. For some people, the dialysate contents vary. For instance, the mixture might be a stronger solution for overnight.  °· Check the expiration date (the last date you can use the bag). It also is on the label. If the date has gone by, throw away the bag.  °· The solution should be clear. You should be able to see any writing on the side of the bag clearly through the solution. Do not use a cloudy solution.  °· Gently squeeze the bag to make sure there are no leaks.  °· Use a dry heating pad to warm the dialysate in the bag. Leave the cover on the bag while you do this.  °· This is for comfort. You can skip this step if you want.  °· Never place the bag of solution under warm or hot water. Water from a faucet is not sterile and could cause germs to  get into the bag. Infection could then result.  °PERFORMING AN EXCHANGE °· For continuous ambulatory dialysis:  °· Attach the dialysis bag and tubing to your catheter. Hang the bag so that gravity (the natural downward pull) draws the solution down and into your abdomen once the clamps are opened. This should take about 10 minutes.  °· Remove the bag and tubing from the catheter. Cap the catheter.  °· The solution stays in the abdomen for 3-4 hours (dwell time). The solution is working to clean the blood and remove wastes from your body.  °· When you are ready to drain the solution for another exchange, take the cap off the catheter. Then, attach the catheter to tubing, which is attached to an empty bag. Place this empty bag below the abdomen or on the floor or stool and undo the clamps.  °· Gravity helps pull the fluid out of the abdomen and into the bag. The fluid in the bag may look yellow and clear, like urine. It usually takes about 20 minutes to drain the fluid out of the abdomen.  °· When the solution has drained, start the process again by infusing a new bag of dialysate and then capping the catheter.  °· This should continue until you have used all of the solution that you are to use each day.  °· Sometimes, a small machine is used overnight. It is called a mini-cycler. This is done if the body cannot go all night without an exchange. The machine lets you sleep without having to get up and do an exchange.  °· For continuous cycler-assisted dialysis:  °· You will be taught how to set up or program your machine.  °· When you are ready for bed,   put the dialysate bags onto the cycler machine. Put on exactly the number of bags that your caregiver said to use.  °· Connect your catheter to the machine and turn the cycler machine on.  °· Overnight, the cycler will do several exchanges. It often does three to five, sometimes more.  °· Solution that is in your abdomen in the morning will stay during the day. The  machine is set to make the daytime solution stronger, if that is needed.  °· In the morning, you will disconnect from the machine and cap your catheter and go about your day.  °· Sometimes, an extra exchange is done during the day. This may be needed to remove excess waste or fluid.  °IMPORTANT REMINDERS °· You will need to follow a very strict schedule. Every step of the dialysis procedure must be done every day. Sometimes, several times a day. Altogether, this might take an extra 2 hours or more. However, you must stick to the routine. Do not skip a day. Do not skip a procedure.  °· Some people find it helpful to work with a counselor or social worker in addition to the renal (kidney) nurse. They can help you figure out how to change your daily routine to fit in the dialysis sessions.  °· You may need to change your diet. Ask your caregiver for advice, or talk with a nutritionist about what you should and should not eat.  °· You will need to weigh yourself every day and keep track of what your weight is.  °· You may be taught how to check your blood pressure before every exchange. Your blood pressure reading will help determine what type of solution to use. If your blood pressure is too high, you may need a stronger solution.  °RISKS AND COMPLICATIONS  °Possible problems vary, depending on the method you use. Your overall health also can have an effect. Problems that could develop because of dialysis include: °· Infection. This is the most common problem. It could occur:  °· In the peritoneum. This is called peritonitis.  °· Around the catheter.  °· Weight gain. The dialysate contains a type of sugar known as dextrose. Dextrose has a lot of calories. The body takes in several hundred calories from this sugar each day.  °· Weakened muscles in the abdomen. This can result from all of the fluid that your body has to hold in the abdomen.  °· Catheter replacement. Sometimes, a new one has to be put in.  °· Change in  dialysis method. Due to some complications, you may need to change to hemodialysis for a short time and have your dialysis done at a center.  °· Trouble adjusting to your new lifestyle. In some people, this leads to depression.  °· Sleep problems.  °· Dialysis-related amyloidosis. This sometimes occurs after 5 years of dialysis. Protein builds up in the blood. This can cause painful deposits on bones, joints and tendons (which connect muscle to bone). Or, it can cause hollow spots in bones that make them more likely to break.  °· Excess fluid. Your body may absorb too much of the fluid that is held in the abdomen. This can lead to heart or lung problems.  °SEEK MEDICAL CARE IF:  °· You have any problems with an exchange.  °· The area around the catheter becomes red or painful.  °· The catheter seems loose, or it feels like it is coming out.  °· A bag of dialysate looks   cloudy. Or, the liquid is an unusual color.  °· Abdominal pain or discomfort.  °· You feel sick to your stomach (nauseous) or throw up (vomit).  °· You develop a fever of more than 102° F (38.9° C).  °SEEK IMMEDIATE MEDICAL CARE IF:  °You develop a fever of more than 102° F (38.9° C). °Document Released: 05/05/2009 Document Revised: 06/27/2011 Document Reviewed: 05/05/2009 °ExitCare® Patient Information ©2012 [employer]. ° °Diet for Peritoneal Dialysis °This diet may be modified in protein, sodium, phosphorus, potassium, or fluid, depending on your needs. The goals of nutrition therapy are similar to those for patients on hemodialysis. Providing enough protein to replace peritoneal losses is a priority. °USES OF THIS DIET °The diet is designed for the patient with end-stage kidney (renal) disease, who is treated by peritoneal dialysis. Treatment options include: °· Continuous Ambulatory Peritoneal Dialysis (CAPD): Usually 4 exchanges of 1.5 to 2 liter volumes of glucose (sugar) and electrolyte-containing dialysate.  °· Continuous Cyclic Peritoneal  Dialysis (CCPD): Essentially a reversal of CAPD, with shorter exchanges at night and a longer one during the day.  °· Intermittent Peritoneal Dialysis (IPD): 10 to 12 hours of exchanges, 2 to 3 times weekly.  °ADEQUACY °The diet may not meet the Recommended Dietary Allowances of the National Research Council for calcium and ascorbic acid. Protein and water-soluble vitamin needs may be increased because of losses into the dialysate. Recommended daily supplements are the same as for hemodialysis patients. °ASSESSMENT/DETERMINATION OF DIET °Dietary needs will differ between patients. Parameters must be individualized. °Protein °· Guidelines: 1.2 to 1.3 gm/kg/day OR 1.5 gm/kg/day if patient is malnourished, catabolic, or has a protracted episode of peritonitis. A minimum of 50% of the protein intake should be of high biological value.  °· Goals: Meet protein requirements and replace dialysate losses while avoiding excessive accumulation of waste products. Achieve serum albumin greater than 3.5 g/dL.  °· Evaluate: Current nutritional status, serum albumin and BUN levels, presence of peritonitis.  °Sodium °· Guidelines: Usually 90 to 175 mEq (2000 to 4000 mg), but should be individualized.  °· Goals: Minimize complications of fluid imbalance.  °· Evaluate: Weight, blood pressure regulation, and presence of swelling (edema).  °Potassium °· Guidelines: Individualized; often not restricted, and may need to be supplemented.  °· Goals: Serum K+ levels between 4.0 to 5.0 mEq/L.  °· Evaluate: Serum K+ levels, usual intake of K+, appetite.  °Phosphorus °· Guidelines: 800 to 1200 mg/day (the high protein intake results in a high obligatory P intake).  °· Goal: Serum P levels between 4.5 to 6.0 mg/dL.  °· Evaluate: Serum P levels, usual P intake, P-binding medications: type, number, dosage, distribution.  °Fluids °· Guidelines: Individualized - may not be restricted for all patients.  °· Goal: Minimize complications of fluid  imbalance.  °· Evaluate: Weight, blood pressure regulation, sodium intake, and presence of edema.  °Document Released: 07/08/2005 Document Revised: 06/27/2011 Document Reviewed: 09/30/2006 °ExitCare® Patient Information ©2012 [employer]. °

## 2017-11-03 NOTE — Progress Notes (Signed)
Pt's sister called by Dr Kae Heller post op -no answer. Call for ride home at [phone]- voicemail. Pt more alert at 0940- given phone to test brother -in law Family at hospital at [phone]

## 2017-11-04 ENCOUNTER — Encounter (HOSPITAL_COMMUNITY): Payer: Self-pay | Admitting: Surgery

## 2017-11-06 DIAGNOSIS — Z992 Dependence on renal dialysis: Secondary | ICD-10-CM | POA: Diagnosis not present

## 2017-11-06 DIAGNOSIS — D509 Iron deficiency anemia, unspecified: Secondary | ICD-10-CM | POA: Diagnosis not present

## 2017-11-06 DIAGNOSIS — D631 Anemia in chronic kidney disease: Secondary | ICD-10-CM | POA: Diagnosis not present

## 2017-11-06 DIAGNOSIS — N2581 Secondary hyperparathyroidism of renal origin: Secondary | ICD-10-CM | POA: Diagnosis not present

## 2017-11-06 DIAGNOSIS — N186 End stage renal disease: Secondary | ICD-10-CM | POA: Diagnosis not present

## 2017-11-08 DIAGNOSIS — Z992 Dependence on renal dialysis: Secondary | ICD-10-CM | POA: Diagnosis not present

## 2017-11-08 DIAGNOSIS — N186 End stage renal disease: Secondary | ICD-10-CM | POA: Diagnosis not present

## 2017-11-08 DIAGNOSIS — D631 Anemia in chronic kidney disease: Secondary | ICD-10-CM | POA: Diagnosis not present

## 2017-11-08 DIAGNOSIS — D509 Iron deficiency anemia, unspecified: Secondary | ICD-10-CM | POA: Diagnosis not present

## 2017-11-08 DIAGNOSIS — N2581 Secondary hyperparathyroidism of renal origin: Secondary | ICD-10-CM | POA: Diagnosis not present

## 2017-11-11 DIAGNOSIS — N186 End stage renal disease: Secondary | ICD-10-CM | POA: Diagnosis not present

## 2017-11-11 DIAGNOSIS — D631 Anemia in chronic kidney disease: Secondary | ICD-10-CM | POA: Diagnosis not present

## 2017-11-11 DIAGNOSIS — D509 Iron deficiency anemia, unspecified: Secondary | ICD-10-CM | POA: Diagnosis not present

## 2017-11-11 DIAGNOSIS — N2581 Secondary hyperparathyroidism of renal origin: Secondary | ICD-10-CM | POA: Diagnosis not present

## 2017-11-11 DIAGNOSIS — Z992 Dependence on renal dialysis: Secondary | ICD-10-CM | POA: Diagnosis not present

## 2017-11-13 DIAGNOSIS — N2581 Secondary hyperparathyroidism of renal origin: Secondary | ICD-10-CM | POA: Diagnosis not present

## 2017-11-13 DIAGNOSIS — D509 Iron deficiency anemia, unspecified: Secondary | ICD-10-CM | POA: Diagnosis not present

## 2017-11-13 DIAGNOSIS — D631 Anemia in chronic kidney disease: Secondary | ICD-10-CM | POA: Diagnosis not present

## 2017-11-13 DIAGNOSIS — N186 End stage renal disease: Secondary | ICD-10-CM | POA: Diagnosis not present

## 2017-11-13 DIAGNOSIS — Z992 Dependence on renal dialysis: Secondary | ICD-10-CM | POA: Diagnosis not present

## 2017-11-15 DIAGNOSIS — D631 Anemia in chronic kidney disease: Secondary | ICD-10-CM | POA: Diagnosis not present

## 2017-11-15 DIAGNOSIS — N186 End stage renal disease: Secondary | ICD-10-CM | POA: Diagnosis not present

## 2017-11-15 DIAGNOSIS — N2581 Secondary hyperparathyroidism of renal origin: Secondary | ICD-10-CM | POA: Diagnosis not present

## 2017-11-15 DIAGNOSIS — D509 Iron deficiency anemia, unspecified: Secondary | ICD-10-CM | POA: Diagnosis not present

## 2017-11-15 DIAGNOSIS — Z992 Dependence on renal dialysis: Secondary | ICD-10-CM | POA: Diagnosis not present

## 2017-11-18 DIAGNOSIS — N2581 Secondary hyperparathyroidism of renal origin: Secondary | ICD-10-CM | POA: Diagnosis not present

## 2017-11-18 DIAGNOSIS — N186 End stage renal disease: Secondary | ICD-10-CM | POA: Diagnosis not present

## 2017-11-18 DIAGNOSIS — Z992 Dependence on renal dialysis: Secondary | ICD-10-CM | POA: Diagnosis not present

## 2017-11-18 DIAGNOSIS — D631 Anemia in chronic kidney disease: Secondary | ICD-10-CM | POA: Diagnosis not present

## 2017-11-18 DIAGNOSIS — D509 Iron deficiency anemia, unspecified: Secondary | ICD-10-CM | POA: Diagnosis not present

## 2017-11-20 DIAGNOSIS — Z23 Encounter for immunization: Secondary | ICD-10-CM | POA: Diagnosis not present

## 2017-11-20 DIAGNOSIS — N186 End stage renal disease: Secondary | ICD-10-CM | POA: Diagnosis not present

## 2017-11-20 DIAGNOSIS — Z992 Dependence on renal dialysis: Secondary | ICD-10-CM | POA: Diagnosis not present

## 2017-11-20 DIAGNOSIS — D631 Anemia in chronic kidney disease: Secondary | ICD-10-CM | POA: Diagnosis not present

## 2017-11-20 DIAGNOSIS — N2581 Secondary hyperparathyroidism of renal origin: Secondary | ICD-10-CM | POA: Diagnosis not present

## 2017-11-22 DIAGNOSIS — Z992 Dependence on renal dialysis: Secondary | ICD-10-CM | POA: Diagnosis not present

## 2017-11-22 DIAGNOSIS — N186 End stage renal disease: Secondary | ICD-10-CM | POA: Diagnosis not present

## 2017-11-22 DIAGNOSIS — D631 Anemia in chronic kidney disease: Secondary | ICD-10-CM | POA: Diagnosis not present

## 2017-11-22 DIAGNOSIS — N2581 Secondary hyperparathyroidism of renal origin: Secondary | ICD-10-CM | POA: Diagnosis not present

## 2017-11-22 DIAGNOSIS — Z23 Encounter for immunization: Secondary | ICD-10-CM | POA: Diagnosis not present

## 2017-11-25 DIAGNOSIS — N186 End stage renal disease: Secondary | ICD-10-CM | POA: Diagnosis not present

## 2017-11-25 DIAGNOSIS — Z23 Encounter for immunization: Secondary | ICD-10-CM | POA: Diagnosis not present

## 2017-11-25 DIAGNOSIS — Z992 Dependence on renal dialysis: Secondary | ICD-10-CM | POA: Diagnosis not present

## 2017-11-25 DIAGNOSIS — D631 Anemia in chronic kidney disease: Secondary | ICD-10-CM | POA: Diagnosis not present

## 2017-11-25 DIAGNOSIS — N2581 Secondary hyperparathyroidism of renal origin: Secondary | ICD-10-CM | POA: Diagnosis not present

## 2017-11-27 DIAGNOSIS — N186 End stage renal disease: Secondary | ICD-10-CM | POA: Diagnosis not present

## 2017-11-27 DIAGNOSIS — Z23 Encounter for immunization: Secondary | ICD-10-CM | POA: Diagnosis not present

## 2017-11-27 DIAGNOSIS — N2581 Secondary hyperparathyroidism of renal origin: Secondary | ICD-10-CM | POA: Diagnosis not present

## 2017-11-27 DIAGNOSIS — Z992 Dependence on renal dialysis: Secondary | ICD-10-CM | POA: Diagnosis not present

## 2017-11-27 DIAGNOSIS — D631 Anemia in chronic kidney disease: Secondary | ICD-10-CM | POA: Diagnosis not present

## 2017-11-29 DIAGNOSIS — Z992 Dependence on renal dialysis: Secondary | ICD-10-CM | POA: Diagnosis not present

## 2017-11-29 DIAGNOSIS — N2581 Secondary hyperparathyroidism of renal origin: Secondary | ICD-10-CM | POA: Diagnosis not present

## 2017-11-29 DIAGNOSIS — Z23 Encounter for immunization: Secondary | ICD-10-CM | POA: Diagnosis not present

## 2017-11-29 DIAGNOSIS — N186 End stage renal disease: Secondary | ICD-10-CM | POA: Diagnosis not present

## 2017-11-29 DIAGNOSIS — D631 Anemia in chronic kidney disease: Secondary | ICD-10-CM | POA: Diagnosis not present

## 2017-12-01 DIAGNOSIS — N185 Chronic kidney disease, stage 5: Secondary | ICD-10-CM | POA: Diagnosis not present

## 2017-12-01 DIAGNOSIS — Z125 Encounter for screening for malignant neoplasm of prostate: Secondary | ICD-10-CM | POA: Diagnosis not present

## 2017-12-01 DIAGNOSIS — N401 Enlarged prostate with lower urinary tract symptoms: Secondary | ICD-10-CM | POA: Diagnosis not present

## 2017-12-01 DIAGNOSIS — I10 Essential (primary) hypertension: Secondary | ICD-10-CM | POA: Diagnosis not present

## 2017-12-01 DIAGNOSIS — Z0000 Encounter for general adult medical examination without abnormal findings: Secondary | ICD-10-CM | POA: Diagnosis not present

## 2017-12-01 DIAGNOSIS — D508 Other iron deficiency anemias: Secondary | ICD-10-CM | POA: Diagnosis not present

## 2017-12-01 DIAGNOSIS — K210 Gastro-esophageal reflux disease with esophagitis: Secondary | ICD-10-CM | POA: Diagnosis not present

## 2017-12-02 DIAGNOSIS — D509 Iron deficiency anemia, unspecified: Secondary | ICD-10-CM | POA: Diagnosis not present

## 2017-12-02 DIAGNOSIS — N2581 Secondary hyperparathyroidism of renal origin: Secondary | ICD-10-CM | POA: Diagnosis not present

## 2017-12-02 DIAGNOSIS — Z992 Dependence on renal dialysis: Secondary | ICD-10-CM | POA: Diagnosis not present

## 2017-12-02 DIAGNOSIS — N186 End stage renal disease: Secondary | ICD-10-CM | POA: Diagnosis not present

## 2017-12-03 DIAGNOSIS — N186 End stage renal disease: Secondary | ICD-10-CM | POA: Diagnosis not present

## 2017-12-03 DIAGNOSIS — N2581 Secondary hyperparathyroidism of renal origin: Secondary | ICD-10-CM | POA: Diagnosis not present

## 2017-12-03 DIAGNOSIS — Z992 Dependence on renal dialysis: Secondary | ICD-10-CM | POA: Diagnosis not present

## 2017-12-03 DIAGNOSIS — D509 Iron deficiency anemia, unspecified: Secondary | ICD-10-CM | POA: Diagnosis not present

## 2017-12-04 DIAGNOSIS — Z992 Dependence on renal dialysis: Secondary | ICD-10-CM | POA: Diagnosis not present

## 2017-12-04 DIAGNOSIS — N186 End stage renal disease: Secondary | ICD-10-CM | POA: Diagnosis not present

## 2017-12-04 DIAGNOSIS — N2581 Secondary hyperparathyroidism of renal origin: Secondary | ICD-10-CM | POA: Diagnosis not present

## 2017-12-04 DIAGNOSIS — D509 Iron deficiency anemia, unspecified: Secondary | ICD-10-CM | POA: Diagnosis not present

## 2017-12-05 DIAGNOSIS — Z992 Dependence on renal dialysis: Secondary | ICD-10-CM | POA: Diagnosis not present

## 2017-12-05 DIAGNOSIS — D509 Iron deficiency anemia, unspecified: Secondary | ICD-10-CM | POA: Diagnosis not present

## 2017-12-05 DIAGNOSIS — N186 End stage renal disease: Secondary | ICD-10-CM | POA: Diagnosis not present

## 2017-12-05 DIAGNOSIS — N2581 Secondary hyperparathyroidism of renal origin: Secondary | ICD-10-CM | POA: Diagnosis not present

## 2017-12-06 DIAGNOSIS — N186 End stage renal disease: Secondary | ICD-10-CM | POA: Diagnosis not present

## 2017-12-06 DIAGNOSIS — D509 Iron deficiency anemia, unspecified: Secondary | ICD-10-CM | POA: Diagnosis not present

## 2017-12-06 DIAGNOSIS — Z992 Dependence on renal dialysis: Secondary | ICD-10-CM | POA: Diagnosis not present

## 2017-12-06 DIAGNOSIS — N2581 Secondary hyperparathyroidism of renal origin: Secondary | ICD-10-CM | POA: Diagnosis not present

## 2017-12-08 DIAGNOSIS — Z992 Dependence on renal dialysis: Secondary | ICD-10-CM | POA: Diagnosis not present

## 2017-12-08 DIAGNOSIS — N186 End stage renal disease: Secondary | ICD-10-CM | POA: Diagnosis not present

## 2017-12-09 DIAGNOSIS — N186 End stage renal disease: Secondary | ICD-10-CM | POA: Diagnosis not present

## 2017-12-09 DIAGNOSIS — Z992 Dependence on renal dialysis: Secondary | ICD-10-CM | POA: Diagnosis not present

## 2017-12-10 ENCOUNTER — Ambulatory Visit (INDEPENDENT_AMBULATORY_CARE_PROVIDER_SITE_OTHER): Payer: Medicare Other

## 2017-12-10 ENCOUNTER — Other Ambulatory Visit: Payer: Self-pay

## 2017-12-10 DIAGNOSIS — I351 Nonrheumatic aortic (valve) insufficiency: Secondary | ICD-10-CM | POA: Diagnosis not present

## 2017-12-11 ENCOUNTER — Telehealth: Payer: Self-pay

## 2017-12-11 DIAGNOSIS — Z992 Dependence on renal dialysis: Secondary | ICD-10-CM | POA: Diagnosis not present

## 2017-12-11 DIAGNOSIS — N186 End stage renal disease: Secondary | ICD-10-CM | POA: Diagnosis not present

## 2017-12-11 NOTE — Telephone Encounter (Signed)
-----   Message from Satira Sark, MD sent at 12/10/2017  4:56 PM EDT ----- Results reviewed.  Echocardiogram shows stable LVEF at 60 to 65% and aortic regurgitation remains moderate overall.  We will continue with current follow-up plan. A copy of this test should be forwarded to Neale Burly, MD.

## 2017-12-11 NOTE — Telephone Encounter (Signed)
Patient notified. Routed to PCP 

## 2017-12-12 ENCOUNTER — Ambulatory Visit: Payer: Medicaid Other | Admitting: Cardiology

## 2017-12-12 DIAGNOSIS — Z992 Dependence on renal dialysis: Secondary | ICD-10-CM | POA: Diagnosis not present

## 2017-12-12 DIAGNOSIS — N186 End stage renal disease: Secondary | ICD-10-CM | POA: Diagnosis not present

## 2017-12-13 DIAGNOSIS — N186 End stage renal disease: Secondary | ICD-10-CM | POA: Diagnosis not present

## 2017-12-13 DIAGNOSIS — Z992 Dependence on renal dialysis: Secondary | ICD-10-CM | POA: Diagnosis not present

## 2017-12-16 DIAGNOSIS — N186 End stage renal disease: Secondary | ICD-10-CM | POA: Diagnosis not present

## 2017-12-16 DIAGNOSIS — Z992 Dependence on renal dialysis: Secondary | ICD-10-CM | POA: Diagnosis not present

## 2017-12-17 DIAGNOSIS — N186 End stage renal disease: Secondary | ICD-10-CM | POA: Diagnosis not present

## 2017-12-17 DIAGNOSIS — Z992 Dependence on renal dialysis: Secondary | ICD-10-CM | POA: Diagnosis not present

## 2017-12-18 DIAGNOSIS — N186 End stage renal disease: Secondary | ICD-10-CM | POA: Diagnosis not present

## 2017-12-18 DIAGNOSIS — Z992 Dependence on renal dialysis: Secondary | ICD-10-CM | POA: Diagnosis not present

## 2017-12-19 DIAGNOSIS — N186 End stage renal disease: Secondary | ICD-10-CM | POA: Diagnosis not present

## 2017-12-19 DIAGNOSIS — Z992 Dependence on renal dialysis: Secondary | ICD-10-CM | POA: Diagnosis not present

## 2017-12-20 DIAGNOSIS — N186 End stage renal disease: Secondary | ICD-10-CM | POA: Diagnosis not present

## 2017-12-20 DIAGNOSIS — D631 Anemia in chronic kidney disease: Secondary | ICD-10-CM | POA: Diagnosis not present

## 2017-12-20 DIAGNOSIS — N2581 Secondary hyperparathyroidism of renal origin: Secondary | ICD-10-CM | POA: Diagnosis not present

## 2017-12-20 DIAGNOSIS — Z992 Dependence on renal dialysis: Secondary | ICD-10-CM | POA: Diagnosis not present

## 2017-12-20 DIAGNOSIS — D509 Iron deficiency anemia, unspecified: Secondary | ICD-10-CM | POA: Diagnosis not present

## 2017-12-21 DIAGNOSIS — D631 Anemia in chronic kidney disease: Secondary | ICD-10-CM | POA: Diagnosis not present

## 2017-12-21 DIAGNOSIS — N186 End stage renal disease: Secondary | ICD-10-CM | POA: Diagnosis not present

## 2017-12-21 DIAGNOSIS — D509 Iron deficiency anemia, unspecified: Secondary | ICD-10-CM | POA: Diagnosis not present

## 2017-12-21 DIAGNOSIS — Z992 Dependence on renal dialysis: Secondary | ICD-10-CM | POA: Diagnosis not present

## 2017-12-21 DIAGNOSIS — N2581 Secondary hyperparathyroidism of renal origin: Secondary | ICD-10-CM | POA: Diagnosis not present

## 2017-12-22 DIAGNOSIS — Z992 Dependence on renal dialysis: Secondary | ICD-10-CM | POA: Diagnosis not present

## 2017-12-22 DIAGNOSIS — D509 Iron deficiency anemia, unspecified: Secondary | ICD-10-CM | POA: Diagnosis not present

## 2017-12-22 DIAGNOSIS — N2581 Secondary hyperparathyroidism of renal origin: Secondary | ICD-10-CM | POA: Diagnosis not present

## 2017-12-22 DIAGNOSIS — D631 Anemia in chronic kidney disease: Secondary | ICD-10-CM | POA: Diagnosis not present

## 2017-12-22 DIAGNOSIS — N186 End stage renal disease: Secondary | ICD-10-CM | POA: Diagnosis not present

## 2017-12-23 DIAGNOSIS — D631 Anemia in chronic kidney disease: Secondary | ICD-10-CM | POA: Diagnosis not present

## 2017-12-23 DIAGNOSIS — M810 Age-related osteoporosis without current pathological fracture: Secondary | ICD-10-CM | POA: Diagnosis not present

## 2017-12-23 DIAGNOSIS — D509 Iron deficiency anemia, unspecified: Secondary | ICD-10-CM | POA: Diagnosis not present

## 2017-12-23 DIAGNOSIS — N186 End stage renal disease: Secondary | ICD-10-CM | POA: Diagnosis not present

## 2017-12-23 DIAGNOSIS — N2581 Secondary hyperparathyroidism of renal origin: Secondary | ICD-10-CM | POA: Diagnosis not present

## 2017-12-23 DIAGNOSIS — Z992 Dependence on renal dialysis: Secondary | ICD-10-CM | POA: Diagnosis not present

## 2017-12-23 DIAGNOSIS — M818 Other osteoporosis without current pathological fracture: Secondary | ICD-10-CM | POA: Diagnosis not present

## 2017-12-24 DIAGNOSIS — N186 End stage renal disease: Secondary | ICD-10-CM | POA: Diagnosis not present

## 2017-12-24 DIAGNOSIS — D631 Anemia in chronic kidney disease: Secondary | ICD-10-CM | POA: Diagnosis not present

## 2017-12-24 DIAGNOSIS — D509 Iron deficiency anemia, unspecified: Secondary | ICD-10-CM | POA: Diagnosis not present

## 2017-12-24 DIAGNOSIS — N2581 Secondary hyperparathyroidism of renal origin: Secondary | ICD-10-CM | POA: Diagnosis not present

## 2017-12-24 DIAGNOSIS — Z992 Dependence on renal dialysis: Secondary | ICD-10-CM | POA: Diagnosis not present

## 2017-12-25 DIAGNOSIS — D631 Anemia in chronic kidney disease: Secondary | ICD-10-CM | POA: Diagnosis not present

## 2017-12-25 DIAGNOSIS — D509 Iron deficiency anemia, unspecified: Secondary | ICD-10-CM | POA: Diagnosis not present

## 2017-12-25 DIAGNOSIS — N2581 Secondary hyperparathyroidism of renal origin: Secondary | ICD-10-CM | POA: Diagnosis not present

## 2017-12-25 DIAGNOSIS — Z992 Dependence on renal dialysis: Secondary | ICD-10-CM | POA: Diagnosis not present

## 2017-12-25 DIAGNOSIS — N186 End stage renal disease: Secondary | ICD-10-CM | POA: Diagnosis not present

## 2017-12-26 DIAGNOSIS — D631 Anemia in chronic kidney disease: Secondary | ICD-10-CM | POA: Diagnosis not present

## 2017-12-26 DIAGNOSIS — Z992 Dependence on renal dialysis: Secondary | ICD-10-CM | POA: Diagnosis not present

## 2017-12-26 DIAGNOSIS — N2581 Secondary hyperparathyroidism of renal origin: Secondary | ICD-10-CM | POA: Diagnosis not present

## 2017-12-26 DIAGNOSIS — N186 End stage renal disease: Secondary | ICD-10-CM | POA: Diagnosis not present

## 2017-12-26 DIAGNOSIS — D509 Iron deficiency anemia, unspecified: Secondary | ICD-10-CM | POA: Diagnosis not present

## 2017-12-27 DIAGNOSIS — D631 Anemia in chronic kidney disease: Secondary | ICD-10-CM | POA: Diagnosis not present

## 2017-12-27 DIAGNOSIS — N2581 Secondary hyperparathyroidism of renal origin: Secondary | ICD-10-CM | POA: Diagnosis not present

## 2017-12-27 DIAGNOSIS — D509 Iron deficiency anemia, unspecified: Secondary | ICD-10-CM | POA: Diagnosis not present

## 2017-12-27 DIAGNOSIS — Z992 Dependence on renal dialysis: Secondary | ICD-10-CM | POA: Diagnosis not present

## 2017-12-27 DIAGNOSIS — N186 End stage renal disease: Secondary | ICD-10-CM | POA: Diagnosis not present

## 2017-12-28 DIAGNOSIS — D509 Iron deficiency anemia, unspecified: Secondary | ICD-10-CM | POA: Diagnosis not present

## 2017-12-28 DIAGNOSIS — D631 Anemia in chronic kidney disease: Secondary | ICD-10-CM | POA: Diagnosis not present

## 2017-12-28 DIAGNOSIS — N2581 Secondary hyperparathyroidism of renal origin: Secondary | ICD-10-CM | POA: Diagnosis not present

## 2017-12-28 DIAGNOSIS — N186 End stage renal disease: Secondary | ICD-10-CM | POA: Diagnosis not present

## 2017-12-28 DIAGNOSIS — Z992 Dependence on renal dialysis: Secondary | ICD-10-CM | POA: Diagnosis not present

## 2017-12-29 DIAGNOSIS — D631 Anemia in chronic kidney disease: Secondary | ICD-10-CM | POA: Diagnosis not present

## 2017-12-29 DIAGNOSIS — N2581 Secondary hyperparathyroidism of renal origin: Secondary | ICD-10-CM | POA: Diagnosis not present

## 2017-12-29 DIAGNOSIS — D509 Iron deficiency anemia, unspecified: Secondary | ICD-10-CM | POA: Diagnosis not present

## 2017-12-29 DIAGNOSIS — Z992 Dependence on renal dialysis: Secondary | ICD-10-CM | POA: Diagnosis not present

## 2017-12-29 DIAGNOSIS — N186 End stage renal disease: Secondary | ICD-10-CM | POA: Diagnosis not present

## 2017-12-30 DIAGNOSIS — Z992 Dependence on renal dialysis: Secondary | ICD-10-CM | POA: Diagnosis not present

## 2017-12-30 DIAGNOSIS — D509 Iron deficiency anemia, unspecified: Secondary | ICD-10-CM | POA: Diagnosis not present

## 2017-12-30 DIAGNOSIS — D631 Anemia in chronic kidney disease: Secondary | ICD-10-CM | POA: Diagnosis not present

## 2017-12-30 DIAGNOSIS — N186 End stage renal disease: Secondary | ICD-10-CM | POA: Diagnosis not present

## 2017-12-30 DIAGNOSIS — N2581 Secondary hyperparathyroidism of renal origin: Secondary | ICD-10-CM | POA: Diagnosis not present

## 2017-12-31 DIAGNOSIS — D631 Anemia in chronic kidney disease: Secondary | ICD-10-CM | POA: Diagnosis not present

## 2017-12-31 DIAGNOSIS — N2581 Secondary hyperparathyroidism of renal origin: Secondary | ICD-10-CM | POA: Diagnosis not present

## 2017-12-31 DIAGNOSIS — Z992 Dependence on renal dialysis: Secondary | ICD-10-CM | POA: Diagnosis not present

## 2017-12-31 DIAGNOSIS — N186 End stage renal disease: Secondary | ICD-10-CM | POA: Diagnosis not present

## 2017-12-31 DIAGNOSIS — D509 Iron deficiency anemia, unspecified: Secondary | ICD-10-CM | POA: Diagnosis not present

## 2018-01-01 DIAGNOSIS — N186 End stage renal disease: Secondary | ICD-10-CM | POA: Diagnosis not present

## 2018-01-01 DIAGNOSIS — D631 Anemia in chronic kidney disease: Secondary | ICD-10-CM | POA: Diagnosis not present

## 2018-01-01 DIAGNOSIS — D509 Iron deficiency anemia, unspecified: Secondary | ICD-10-CM | POA: Diagnosis not present

## 2018-01-01 DIAGNOSIS — Z992 Dependence on renal dialysis: Secondary | ICD-10-CM | POA: Diagnosis not present

## 2018-01-01 DIAGNOSIS — N2581 Secondary hyperparathyroidism of renal origin: Secondary | ICD-10-CM | POA: Diagnosis not present

## 2018-01-02 DIAGNOSIS — D509 Iron deficiency anemia, unspecified: Secondary | ICD-10-CM | POA: Diagnosis not present

## 2018-01-02 DIAGNOSIS — D631 Anemia in chronic kidney disease: Secondary | ICD-10-CM | POA: Diagnosis not present

## 2018-01-02 DIAGNOSIS — N2581 Secondary hyperparathyroidism of renal origin: Secondary | ICD-10-CM | POA: Diagnosis not present

## 2018-01-02 DIAGNOSIS — Z992 Dependence on renal dialysis: Secondary | ICD-10-CM | POA: Diagnosis not present

## 2018-01-02 DIAGNOSIS — N186 End stage renal disease: Secondary | ICD-10-CM | POA: Diagnosis not present

## 2018-01-03 DIAGNOSIS — N2581 Secondary hyperparathyroidism of renal origin: Secondary | ICD-10-CM | POA: Diagnosis not present

## 2018-01-03 DIAGNOSIS — N186 End stage renal disease: Secondary | ICD-10-CM | POA: Diagnosis not present

## 2018-01-03 DIAGNOSIS — Z992 Dependence on renal dialysis: Secondary | ICD-10-CM | POA: Diagnosis not present

## 2018-01-03 DIAGNOSIS — D509 Iron deficiency anemia, unspecified: Secondary | ICD-10-CM | POA: Diagnosis not present

## 2018-01-03 DIAGNOSIS — D631 Anemia in chronic kidney disease: Secondary | ICD-10-CM | POA: Diagnosis not present

## 2018-01-04 DIAGNOSIS — N186 End stage renal disease: Secondary | ICD-10-CM | POA: Diagnosis not present

## 2018-01-04 DIAGNOSIS — N2581 Secondary hyperparathyroidism of renal origin: Secondary | ICD-10-CM | POA: Diagnosis not present

## 2018-01-04 DIAGNOSIS — Z992 Dependence on renal dialysis: Secondary | ICD-10-CM | POA: Diagnosis not present

## 2018-01-04 DIAGNOSIS — D631 Anemia in chronic kidney disease: Secondary | ICD-10-CM | POA: Diagnosis not present

## 2018-01-04 DIAGNOSIS — D509 Iron deficiency anemia, unspecified: Secondary | ICD-10-CM | POA: Diagnosis not present

## 2018-01-05 DIAGNOSIS — Z992 Dependence on renal dialysis: Secondary | ICD-10-CM | POA: Diagnosis not present

## 2018-01-05 DIAGNOSIS — D509 Iron deficiency anemia, unspecified: Secondary | ICD-10-CM | POA: Diagnosis not present

## 2018-01-05 DIAGNOSIS — N186 End stage renal disease: Secondary | ICD-10-CM | POA: Diagnosis not present

## 2018-01-05 DIAGNOSIS — D631 Anemia in chronic kidney disease: Secondary | ICD-10-CM | POA: Diagnosis not present

## 2018-01-05 DIAGNOSIS — N2581 Secondary hyperparathyroidism of renal origin: Secondary | ICD-10-CM | POA: Diagnosis not present

## 2018-01-06 DIAGNOSIS — N2581 Secondary hyperparathyroidism of renal origin: Secondary | ICD-10-CM | POA: Diagnosis not present

## 2018-01-06 DIAGNOSIS — D631 Anemia in chronic kidney disease: Secondary | ICD-10-CM | POA: Diagnosis not present

## 2018-01-06 DIAGNOSIS — N186 End stage renal disease: Secondary | ICD-10-CM | POA: Diagnosis not present

## 2018-01-06 DIAGNOSIS — D509 Iron deficiency anemia, unspecified: Secondary | ICD-10-CM | POA: Diagnosis not present

## 2018-01-06 DIAGNOSIS — Z992 Dependence on renal dialysis: Secondary | ICD-10-CM | POA: Diagnosis not present

## 2018-01-07 DIAGNOSIS — N186 End stage renal disease: Secondary | ICD-10-CM | POA: Diagnosis not present

## 2018-01-07 DIAGNOSIS — D509 Iron deficiency anemia, unspecified: Secondary | ICD-10-CM | POA: Diagnosis not present

## 2018-01-07 DIAGNOSIS — N2581 Secondary hyperparathyroidism of renal origin: Secondary | ICD-10-CM | POA: Diagnosis not present

## 2018-01-07 DIAGNOSIS — D631 Anemia in chronic kidney disease: Secondary | ICD-10-CM | POA: Diagnosis not present

## 2018-01-07 DIAGNOSIS — Z992 Dependence on renal dialysis: Secondary | ICD-10-CM | POA: Diagnosis not present

## 2018-01-08 DIAGNOSIS — Z992 Dependence on renal dialysis: Secondary | ICD-10-CM | POA: Diagnosis not present

## 2018-01-08 DIAGNOSIS — D509 Iron deficiency anemia, unspecified: Secondary | ICD-10-CM | POA: Diagnosis not present

## 2018-01-08 DIAGNOSIS — N2581 Secondary hyperparathyroidism of renal origin: Secondary | ICD-10-CM | POA: Diagnosis not present

## 2018-01-08 DIAGNOSIS — N186 End stage renal disease: Secondary | ICD-10-CM | POA: Diagnosis not present

## 2018-01-08 DIAGNOSIS — D631 Anemia in chronic kidney disease: Secondary | ICD-10-CM | POA: Diagnosis not present

## 2018-01-09 DIAGNOSIS — D509 Iron deficiency anemia, unspecified: Secondary | ICD-10-CM | POA: Diagnosis not present

## 2018-01-09 DIAGNOSIS — Z992 Dependence on renal dialysis: Secondary | ICD-10-CM | POA: Diagnosis not present

## 2018-01-09 DIAGNOSIS — N186 End stage renal disease: Secondary | ICD-10-CM | POA: Diagnosis not present

## 2018-01-09 DIAGNOSIS — D631 Anemia in chronic kidney disease: Secondary | ICD-10-CM | POA: Diagnosis not present

## 2018-01-09 DIAGNOSIS — N2581 Secondary hyperparathyroidism of renal origin: Secondary | ICD-10-CM | POA: Diagnosis not present

## 2018-01-10 DIAGNOSIS — N2581 Secondary hyperparathyroidism of renal origin: Secondary | ICD-10-CM | POA: Diagnosis not present

## 2018-01-10 DIAGNOSIS — Z992 Dependence on renal dialysis: Secondary | ICD-10-CM | POA: Diagnosis not present

## 2018-01-10 DIAGNOSIS — D509 Iron deficiency anemia, unspecified: Secondary | ICD-10-CM | POA: Diagnosis not present

## 2018-01-10 DIAGNOSIS — N186 End stage renal disease: Secondary | ICD-10-CM | POA: Diagnosis not present

## 2018-01-10 DIAGNOSIS — D631 Anemia in chronic kidney disease: Secondary | ICD-10-CM | POA: Diagnosis not present

## 2018-01-11 DIAGNOSIS — D631 Anemia in chronic kidney disease: Secondary | ICD-10-CM | POA: Diagnosis not present

## 2018-01-11 DIAGNOSIS — N186 End stage renal disease: Secondary | ICD-10-CM | POA: Diagnosis not present

## 2018-01-11 DIAGNOSIS — D509 Iron deficiency anemia, unspecified: Secondary | ICD-10-CM | POA: Diagnosis not present

## 2018-01-11 DIAGNOSIS — Z992 Dependence on renal dialysis: Secondary | ICD-10-CM | POA: Diagnosis not present

## 2018-01-11 DIAGNOSIS — N2581 Secondary hyperparathyroidism of renal origin: Secondary | ICD-10-CM | POA: Diagnosis not present

## 2018-01-12 DIAGNOSIS — Z992 Dependence on renal dialysis: Secondary | ICD-10-CM | POA: Diagnosis not present

## 2018-01-12 DIAGNOSIS — D631 Anemia in chronic kidney disease: Secondary | ICD-10-CM | POA: Diagnosis not present

## 2018-01-12 DIAGNOSIS — N2581 Secondary hyperparathyroidism of renal origin: Secondary | ICD-10-CM | POA: Diagnosis not present

## 2018-01-12 DIAGNOSIS — N186 End stage renal disease: Secondary | ICD-10-CM | POA: Diagnosis not present

## 2018-01-12 DIAGNOSIS — D509 Iron deficiency anemia, unspecified: Secondary | ICD-10-CM | POA: Diagnosis not present

## 2018-01-13 DIAGNOSIS — Z992 Dependence on renal dialysis: Secondary | ICD-10-CM | POA: Diagnosis not present

## 2018-01-13 DIAGNOSIS — N186 End stage renal disease: Secondary | ICD-10-CM | POA: Diagnosis not present

## 2018-01-13 DIAGNOSIS — D631 Anemia in chronic kidney disease: Secondary | ICD-10-CM | POA: Diagnosis not present

## 2018-01-13 DIAGNOSIS — D509 Iron deficiency anemia, unspecified: Secondary | ICD-10-CM | POA: Diagnosis not present

## 2018-01-13 DIAGNOSIS — N2581 Secondary hyperparathyroidism of renal origin: Secondary | ICD-10-CM | POA: Diagnosis not present

## 2018-01-14 DIAGNOSIS — N186 End stage renal disease: Secondary | ICD-10-CM | POA: Diagnosis not present

## 2018-01-14 DIAGNOSIS — N2581 Secondary hyperparathyroidism of renal origin: Secondary | ICD-10-CM | POA: Diagnosis not present

## 2018-01-14 DIAGNOSIS — D631 Anemia in chronic kidney disease: Secondary | ICD-10-CM | POA: Diagnosis not present

## 2018-01-14 DIAGNOSIS — Z992 Dependence on renal dialysis: Secondary | ICD-10-CM | POA: Diagnosis not present

## 2018-01-14 DIAGNOSIS — D509 Iron deficiency anemia, unspecified: Secondary | ICD-10-CM | POA: Diagnosis not present

## 2018-01-15 DIAGNOSIS — Z992 Dependence on renal dialysis: Secondary | ICD-10-CM | POA: Diagnosis not present

## 2018-01-15 DIAGNOSIS — D509 Iron deficiency anemia, unspecified: Secondary | ICD-10-CM | POA: Diagnosis not present

## 2018-01-15 DIAGNOSIS — D631 Anemia in chronic kidney disease: Secondary | ICD-10-CM | POA: Diagnosis not present

## 2018-01-15 DIAGNOSIS — N186 End stage renal disease: Secondary | ICD-10-CM | POA: Diagnosis not present

## 2018-01-15 DIAGNOSIS — N2581 Secondary hyperparathyroidism of renal origin: Secondary | ICD-10-CM | POA: Diagnosis not present

## 2018-01-16 DIAGNOSIS — D509 Iron deficiency anemia, unspecified: Secondary | ICD-10-CM | POA: Diagnosis not present

## 2018-01-16 DIAGNOSIS — N186 End stage renal disease: Secondary | ICD-10-CM | POA: Diagnosis not present

## 2018-01-16 DIAGNOSIS — N2581 Secondary hyperparathyroidism of renal origin: Secondary | ICD-10-CM | POA: Diagnosis not present

## 2018-01-16 DIAGNOSIS — Z992 Dependence on renal dialysis: Secondary | ICD-10-CM | POA: Diagnosis not present

## 2018-01-16 DIAGNOSIS — D631 Anemia in chronic kidney disease: Secondary | ICD-10-CM | POA: Diagnosis not present

## 2018-01-17 DIAGNOSIS — N186 End stage renal disease: Secondary | ICD-10-CM | POA: Diagnosis not present

## 2018-01-17 DIAGNOSIS — N2581 Secondary hyperparathyroidism of renal origin: Secondary | ICD-10-CM | POA: Diagnosis not present

## 2018-01-17 DIAGNOSIS — D631 Anemia in chronic kidney disease: Secondary | ICD-10-CM | POA: Diagnosis not present

## 2018-01-17 DIAGNOSIS — D509 Iron deficiency anemia, unspecified: Secondary | ICD-10-CM | POA: Diagnosis not present

## 2018-01-17 DIAGNOSIS — Z992 Dependence on renal dialysis: Secondary | ICD-10-CM | POA: Diagnosis not present

## 2018-01-18 DIAGNOSIS — D509 Iron deficiency anemia, unspecified: Secondary | ICD-10-CM | POA: Diagnosis not present

## 2018-01-18 DIAGNOSIS — Z992 Dependence on renal dialysis: Secondary | ICD-10-CM | POA: Diagnosis not present

## 2018-01-18 DIAGNOSIS — D631 Anemia in chronic kidney disease: Secondary | ICD-10-CM | POA: Diagnosis not present

## 2018-01-18 DIAGNOSIS — N186 End stage renal disease: Secondary | ICD-10-CM | POA: Diagnosis not present

## 2018-01-18 DIAGNOSIS — N2581 Secondary hyperparathyroidism of renal origin: Secondary | ICD-10-CM | POA: Diagnosis not present

## 2018-01-19 DIAGNOSIS — N2581 Secondary hyperparathyroidism of renal origin: Secondary | ICD-10-CM | POA: Diagnosis not present

## 2018-01-19 DIAGNOSIS — Z992 Dependence on renal dialysis: Secondary | ICD-10-CM | POA: Diagnosis not present

## 2018-01-19 DIAGNOSIS — D509 Iron deficiency anemia, unspecified: Secondary | ICD-10-CM | POA: Diagnosis not present

## 2018-01-19 DIAGNOSIS — D631 Anemia in chronic kidney disease: Secondary | ICD-10-CM | POA: Diagnosis not present

## 2018-01-19 DIAGNOSIS — N186 End stage renal disease: Secondary | ICD-10-CM | POA: Diagnosis not present

## 2018-01-20 DIAGNOSIS — D509 Iron deficiency anemia, unspecified: Secondary | ICD-10-CM | POA: Diagnosis not present

## 2018-01-20 DIAGNOSIS — Z992 Dependence on renal dialysis: Secondary | ICD-10-CM | POA: Diagnosis not present

## 2018-01-20 DIAGNOSIS — N2581 Secondary hyperparathyroidism of renal origin: Secondary | ICD-10-CM | POA: Diagnosis not present

## 2018-01-20 DIAGNOSIS — N186 End stage renal disease: Secondary | ICD-10-CM | POA: Diagnosis not present

## 2018-01-20 DIAGNOSIS — D631 Anemia in chronic kidney disease: Secondary | ICD-10-CM | POA: Diagnosis not present

## 2018-01-21 DIAGNOSIS — N186 End stage renal disease: Secondary | ICD-10-CM | POA: Diagnosis not present

## 2018-01-21 DIAGNOSIS — N2581 Secondary hyperparathyroidism of renal origin: Secondary | ICD-10-CM | POA: Diagnosis not present

## 2018-01-21 DIAGNOSIS — Z992 Dependence on renal dialysis: Secondary | ICD-10-CM | POA: Diagnosis not present

## 2018-01-21 DIAGNOSIS — D631 Anemia in chronic kidney disease: Secondary | ICD-10-CM | POA: Diagnosis not present

## 2018-01-21 DIAGNOSIS — D509 Iron deficiency anemia, unspecified: Secondary | ICD-10-CM | POA: Diagnosis not present

## 2018-01-22 DIAGNOSIS — N2581 Secondary hyperparathyroidism of renal origin: Secondary | ICD-10-CM | POA: Diagnosis not present

## 2018-01-22 DIAGNOSIS — D631 Anemia in chronic kidney disease: Secondary | ICD-10-CM | POA: Diagnosis not present

## 2018-01-22 DIAGNOSIS — N186 End stage renal disease: Secondary | ICD-10-CM | POA: Diagnosis not present

## 2018-01-22 DIAGNOSIS — D509 Iron deficiency anemia, unspecified: Secondary | ICD-10-CM | POA: Diagnosis not present

## 2018-01-22 DIAGNOSIS — Z992 Dependence on renal dialysis: Secondary | ICD-10-CM | POA: Diagnosis not present

## 2018-01-23 DIAGNOSIS — D509 Iron deficiency anemia, unspecified: Secondary | ICD-10-CM | POA: Diagnosis not present

## 2018-01-23 DIAGNOSIS — N2581 Secondary hyperparathyroidism of renal origin: Secondary | ICD-10-CM | POA: Diagnosis not present

## 2018-01-23 DIAGNOSIS — Z992 Dependence on renal dialysis: Secondary | ICD-10-CM | POA: Diagnosis not present

## 2018-01-23 DIAGNOSIS — N186 End stage renal disease: Secondary | ICD-10-CM | POA: Diagnosis not present

## 2018-01-23 DIAGNOSIS — D631 Anemia in chronic kidney disease: Secondary | ICD-10-CM | POA: Diagnosis not present

## 2018-01-24 DIAGNOSIS — N2581 Secondary hyperparathyroidism of renal origin: Secondary | ICD-10-CM | POA: Diagnosis not present

## 2018-01-24 DIAGNOSIS — D509 Iron deficiency anemia, unspecified: Secondary | ICD-10-CM | POA: Diagnosis not present

## 2018-01-24 DIAGNOSIS — N186 End stage renal disease: Secondary | ICD-10-CM | POA: Diagnosis not present

## 2018-01-24 DIAGNOSIS — D631 Anemia in chronic kidney disease: Secondary | ICD-10-CM | POA: Diagnosis not present

## 2018-01-24 DIAGNOSIS — Z992 Dependence on renal dialysis: Secondary | ICD-10-CM | POA: Diagnosis not present

## 2018-01-25 DIAGNOSIS — D631 Anemia in chronic kidney disease: Secondary | ICD-10-CM | POA: Diagnosis not present

## 2018-01-25 DIAGNOSIS — N2581 Secondary hyperparathyroidism of renal origin: Secondary | ICD-10-CM | POA: Diagnosis not present

## 2018-01-25 DIAGNOSIS — Z992 Dependence on renal dialysis: Secondary | ICD-10-CM | POA: Diagnosis not present

## 2018-01-25 DIAGNOSIS — N186 End stage renal disease: Secondary | ICD-10-CM | POA: Diagnosis not present

## 2018-01-25 DIAGNOSIS — D509 Iron deficiency anemia, unspecified: Secondary | ICD-10-CM | POA: Diagnosis not present

## 2018-01-26 DIAGNOSIS — N186 End stage renal disease: Secondary | ICD-10-CM | POA: Diagnosis not present

## 2018-01-26 DIAGNOSIS — N2581 Secondary hyperparathyroidism of renal origin: Secondary | ICD-10-CM | POA: Diagnosis not present

## 2018-01-26 DIAGNOSIS — Z992 Dependence on renal dialysis: Secondary | ICD-10-CM | POA: Diagnosis not present

## 2018-01-26 DIAGNOSIS — D631 Anemia in chronic kidney disease: Secondary | ICD-10-CM | POA: Diagnosis not present

## 2018-01-26 DIAGNOSIS — D509 Iron deficiency anemia, unspecified: Secondary | ICD-10-CM | POA: Diagnosis not present

## 2018-01-26 NOTE — Progress Notes (Signed)
Cardiology Office Note  Date: 01/27/2018   ID: Rodney Gordon, DOB 08-23-1955, MRN [id number]  PCP: Neale Burly, MD  Primary Cardiologist: Rozann Lesches, MD   Chief Complaint  Patient presents with  . Aortic regurgitation    History of Present Illness: Rodney Gordon is a 62 y.o. male last seen in November 2018.  He is here for routine visit.  He reports no unusual shortness of breath or chest pain.  He has been undergoing peritoneal dialysis per [name], states that he is tolerating it.  Recent follow-up echocardiogram revealed LVEF 60 to 65% with moderate LVH, also stable moderate aortic regurgitation.  We went over the results today and will plan to continue with an annual follow-up.  Review his current medications.  Antihypertensive regimen includes Norvasc, atenolol, Cardura, and Imdur.  Past Medical History:  Diagnosis Date  . Anemia of chronic disease   . Aortic insufficiency    Mild to moderate  . Coronary atherosclerosis of native coronary artery    Nonobstructive at catheterization 2004  . ESRD on hemodialysis ([place])    [name]  . Essential hypertension, benign   . Peripheral vascular disease [facility])     Past Surgical History:  Procedure Laterality Date  . A/V FISTULAGRAM Right 09/05/2017   Procedure: A/V FISTULAGRAM;  Surgeon: Angelia Mould, MD;  Location: [place] [facility];  Service: Cardiovascular;  Laterality: Right;  . AV FISTULA PLACEMENT Right 12/17/2016   Procedure: ARTERIOVENOUS (AV) FISTULA CREATION- RIGHT BRACHIOCEPHALIC;  Surgeon: Elam Dutch, MD;  Location: [place];  Service: Vascular;  Laterality: Right;  . CAPD INSERTION N/A 11/03/2017   Procedure: LAPAROSCOPIC INSERTION CONTINUOUS AMBULATORY PERITONEAL DIALYSIS  (CAPD) CATHETER;  Surgeon: Clovis Riley, MD;  Location: [place];  Service: General;  Laterality: N/A;  . CARDIAC [facility]TERIZATION     [facility]  . IR DIALY SHUNT INTRO NEEDLE/INTRACATH INITIAL W/IMG RIGHT Right  04/24/2017  . IR US GUIDE VASC ACCESS RIGHT  04/24/2017  . PERIPHERAL VASCULAR BALLOON ANGIOPLASTY Right 09/05/2017   Procedure: PERIPHERAL VASCULAR BALLOON ANGIOPLASTY;  Surgeon: Angelia Mould, MD;  Location: [place] [facility];  Service: Cardiovascular;  Laterality: Right;  . PROSTATE SURGERY    . TIBIA FRACTURE SURGERY     Left    Current Outpatient Medications  Medication Sig Dispense Refill  . acetaminophen (TYLENOL) 500 MG tablet Take 1,000 mg by mouth 2 (two) times daily as needed for mild pain.    Marland Kitchen acetaminophen (TYLENOL) 650 MG CR tablet Take 650 mg by mouth daily as needed for pain.    Marland Kitchen amLODipine (NORVASC) 10 MG tablet Take 1 tablet (10 mg total) by mouth daily. 30 tablet 6  . aspirin 81 MG tablet Take 81 mg by mouth daily.     Marland Kitchen atenolol (TENORMIN) 100 MG tablet Take 100 mg by mouth daily.     . calcium acetate (PHOSLO) 667 MG capsule Take 667 mg by mouth 3 (three) times daily with meals.    . diphenhydramine-acetaminophen (TYLENOL PM) 25-500 MG TABS tablet Take 1 tablet by mouth 2 (two) times daily as needed (PAIN).    Marland Kitchen doxazosin (CARDURA) 8 MG tablet Take 8 mg by mouth daily.    Marland Kitchen ibuprofen (ADVIL,MOTRIN) 200 MG tablet Take 200 mg by mouth every 6 (six) hours as needed (for apin.).    Marland Kitchen isosorbide mononitrate (IMDUR) 30 MG 24 hr tablet Take 30 mg by mouth daily.    Marland Kitchen lidocaine-prilocaine (EMLA)  cream Apply 1 application topically as needed (prior to dialysis).     . nitroGLYCERIN (NITROSTAT) 0.4 MG SL tablet Place 1 tablet (0.4 mg total) under the tongue as directed. (Patient taking differently: Place 0.4 mg under the tongue every 5 (five) minutes as needed for chest pain. ) 25 tablet 2  . oxyCODONE-acetaminophen (PERCOCET/ROXICET) 5-325 MG tablet Take 1 tablet by mouth every 6 (six) hours as needed for severe pain. 20 tablet 0  . sodium bicarbonate 650 MG tablet Take 1,300 mg by mouth 3 (three) times daily.      No current facility-administered medications for this  visit.    Allergies:  Patient has no known allergies.   Social History: The patient  reports that he quit smoking about 19 years ago. His smoking use included cigarettes. He started smoking about 36 years ago. He has a 25.00 pack-year smoking history. He has never used smokeless tobacco. He reports that he does not drink alcohol or use drugs.   ROS:  Please see the history of present illness. Otherwise, complete review of systems is positive for none.  All other systems are reviewed and negative.   Physical Exam: VS:  BP (!) 161/66   Pulse (!) 50   Ht 6\' 1"  (1.854 m)   Wt 172 lb 12.8 oz (78.4 kg)   SpO2 99%   BMI 22.80 kg/m , BMI Body mass index is 22.8 kg/m.  Wt Readings from Last 3 Encounters:  01/27/18 172 lb 12.8 oz (78.4 kg)  10/29/17 168 lb (76.2 kg)  09/05/17 165 lb (74.8 kg)    General: Patient appears comfortable at rest. HEENT: Conjunctiva and lids normal, oropharynx clear. Neck: Supple, no elevated JVP or carotid bruits, no thyromegaly. Lungs: Clear to auscultation, nonlabored breathing at rest. Cardiac: Regular rate and rhythm, no S3, 2/6 systolic and diastolic murmurs, no pericardial rub. Abdomen: Soft, nontender, bowel sounds present, peritoneal dialysis catheter lower abdomen. Extremities: No pitting edema, distal pulses 2+.  Left arm AV fistula. Skin: Warm and dry. Musculoskeletal: No kyphosis. Neuropsychiatric: Alert and oriented x3, affect grossly appropriate.  ECG: I personally reviewed the tracing from 10/29/2017 which showed sinus bradycardia with LVH and repolarization changes.  Recent Labwork: 10/29/2017: BUN 32; Creatinine, Ser 9.82; Platelets 271 11/03/2017: Hemoglobin 11.6; Potassium 4.4; Sodium 139   Other Studies Reviewed Today:  Echocardiogram 12/10/2017: Study Conclusions  - Left ventricle: The cavity size was normal. Wall thickness was   increased in a pattern of moderate LVH. Systolic function was   normal. The estimated ejection fraction  was in the range of 60%   to 65%. Wall motion was normal; there were no regional wall   motion abnormalities. Indeterminate diastolic function. - Aortic valve: Moderately calcified annulus. Trileaflet. There was   moderate regurgitation. - Mitral valve: Mildly calcified annulus. There was mild   regurgitation. - Left atrium: The atrium was moderately dilated. - Right atrium: Central venous pressure (est): 3 mm Hg. - Tricuspid valve: There was mild regurgitation. - Pulmonary arteries: PA peak pressure: 32 mm Hg (S). - Pericardium, extracardiac: There was no pericardial effusion.  Assessment and Plan:  1.  Stable, moderate aortic regurgitation.  We will obtain a follow-up echocardiogram with clinical visit in 1 year.  2.  Essential hypertension.  Continue on current medical therapy and keep follow-up with PCP as well as [name].  3.  ESRD, now undergoing peritoneal dialysis.  He follows with [name].  Current medicines were reviewed with the patient today.  Orders Placed This Encounter  Procedures  . ECHOCARDIOGRAM COMPLETE    Disposition: Follow-up in 1 year.  Signed, Satira Sark, MD, [facility] 01/27/2018 2:27 PM    [facility] [address], [place], [place] [address] Phone: [phone]; Fax: [phone]

## 2018-01-27 ENCOUNTER — Encounter: Payer: Self-pay | Admitting: Cardiology

## 2018-01-27 ENCOUNTER — Ambulatory Visit (INDEPENDENT_AMBULATORY_CARE_PROVIDER_SITE_OTHER): Payer: Medicare Other | Admitting: Cardiology

## 2018-01-27 ENCOUNTER — Encounter

## 2018-01-27 VITALS — BP 161/66 | HR 50 | Ht 73.0 in | Wt 172.8 lb

## 2018-01-27 DIAGNOSIS — I351 Nonrheumatic aortic (valve) insufficiency: Secondary | ICD-10-CM | POA: Diagnosis not present

## 2018-01-27 DIAGNOSIS — Z992 Dependence on renal dialysis: Secondary | ICD-10-CM

## 2018-01-27 DIAGNOSIS — N186 End stage renal disease: Secondary | ICD-10-CM | POA: Diagnosis not present

## 2018-01-27 DIAGNOSIS — D509 Iron deficiency anemia, unspecified: Secondary | ICD-10-CM | POA: Diagnosis not present

## 2018-01-27 DIAGNOSIS — D631 Anemia in chronic kidney disease: Secondary | ICD-10-CM | POA: Diagnosis not present

## 2018-01-27 DIAGNOSIS — I10 Essential (primary) hypertension: Secondary | ICD-10-CM | POA: Diagnosis not present

## 2018-01-27 DIAGNOSIS — N2581 Secondary hyperparathyroidism of renal origin: Secondary | ICD-10-CM | POA: Diagnosis not present

## 2018-01-27 NOTE — Patient Instructions (Signed)
Medication Instructions:  Your physician recommends that you continue on your current medications as directed. Please refer to the Current Medication list given to you today.  Labwork: NONE  Testing/Procedures: Your physician has requested that you have an echocardiogram IN 1 YEAR. Echocardiography is a painless test that uses sound waves to create images of your heart. It provides your doctor with information about the size and shape of your heart and how well your heart's chambers and valves are working. This procedure takes approximately one hour. There are no restrictions for this procedure.  Follow-Up: Your physician wants you to follow-up in: [place]. You will receive a reminder letter in the mail two months in advance. If you don't receive a letter, please call our office to schedule the follow-up appointment.  Any Other Special Instructions Will Be Listed Below (If Applicable).  If you need a refill on your cardiac medications before your next appointment, please call your pharmacy.

## 2018-01-28 DIAGNOSIS — D631 Anemia in chronic kidney disease: Secondary | ICD-10-CM | POA: Diagnosis not present

## 2018-01-28 DIAGNOSIS — N2581 Secondary hyperparathyroidism of renal origin: Secondary | ICD-10-CM | POA: Diagnosis not present

## 2018-01-28 DIAGNOSIS — Z992 Dependence on renal dialysis: Secondary | ICD-10-CM | POA: Diagnosis not present

## 2018-01-28 DIAGNOSIS — N186 End stage renal disease: Secondary | ICD-10-CM | POA: Diagnosis not present

## 2018-01-28 DIAGNOSIS — D509 Iron deficiency anemia, unspecified: Secondary | ICD-10-CM | POA: Diagnosis not present

## 2018-01-29 DIAGNOSIS — D509 Iron deficiency anemia, unspecified: Secondary | ICD-10-CM | POA: Diagnosis not present

## 2018-01-29 DIAGNOSIS — N2581 Secondary hyperparathyroidism of renal origin: Secondary | ICD-10-CM | POA: Diagnosis not present

## 2018-01-29 DIAGNOSIS — D631 Anemia in chronic kidney disease: Secondary | ICD-10-CM | POA: Diagnosis not present

## 2018-01-29 DIAGNOSIS — Z992 Dependence on renal dialysis: Secondary | ICD-10-CM | POA: Diagnosis not present

## 2018-01-29 DIAGNOSIS — N186 End stage renal disease: Secondary | ICD-10-CM | POA: Diagnosis not present

## 2018-01-30 DIAGNOSIS — D509 Iron deficiency anemia, unspecified: Secondary | ICD-10-CM | POA: Diagnosis not present

## 2018-01-30 DIAGNOSIS — N186 End stage renal disease: Secondary | ICD-10-CM | POA: Diagnosis not present

## 2018-01-30 DIAGNOSIS — N2581 Secondary hyperparathyroidism of renal origin: Secondary | ICD-10-CM | POA: Diagnosis not present

## 2018-01-30 DIAGNOSIS — D631 Anemia in chronic kidney disease: Secondary | ICD-10-CM | POA: Diagnosis not present

## 2018-01-30 DIAGNOSIS — Z992 Dependence on renal dialysis: Secondary | ICD-10-CM | POA: Diagnosis not present

## 2018-01-31 DIAGNOSIS — Z992 Dependence on renal dialysis: Secondary | ICD-10-CM | POA: Diagnosis not present

## 2018-01-31 DIAGNOSIS — D509 Iron deficiency anemia, unspecified: Secondary | ICD-10-CM | POA: Diagnosis not present

## 2018-01-31 DIAGNOSIS — N186 End stage renal disease: Secondary | ICD-10-CM | POA: Diagnosis not present

## 2018-01-31 DIAGNOSIS — N2581 Secondary hyperparathyroidism of renal origin: Secondary | ICD-10-CM | POA: Diagnosis not present

## 2018-01-31 DIAGNOSIS — D631 Anemia in chronic kidney disease: Secondary | ICD-10-CM | POA: Diagnosis not present

## 2018-02-01 DIAGNOSIS — Z992 Dependence on renal dialysis: Secondary | ICD-10-CM | POA: Diagnosis not present

## 2018-02-01 DIAGNOSIS — N2581 Secondary hyperparathyroidism of renal origin: Secondary | ICD-10-CM | POA: Diagnosis not present

## 2018-02-01 DIAGNOSIS — D631 Anemia in chronic kidney disease: Secondary | ICD-10-CM | POA: Diagnosis not present

## 2018-02-01 DIAGNOSIS — D509 Iron deficiency anemia, unspecified: Secondary | ICD-10-CM | POA: Diagnosis not present

## 2018-02-01 DIAGNOSIS — N186 End stage renal disease: Secondary | ICD-10-CM | POA: Diagnosis not present

## 2018-02-02 DIAGNOSIS — D631 Anemia in chronic kidney disease: Secondary | ICD-10-CM | POA: Diagnosis not present

## 2018-02-02 DIAGNOSIS — D509 Iron deficiency anemia, unspecified: Secondary | ICD-10-CM | POA: Diagnosis not present

## 2018-02-02 DIAGNOSIS — N2581 Secondary hyperparathyroidism of renal origin: Secondary | ICD-10-CM | POA: Diagnosis not present

## 2018-02-02 DIAGNOSIS — N186 End stage renal disease: Secondary | ICD-10-CM | POA: Diagnosis not present

## 2018-02-02 DIAGNOSIS — Z992 Dependence on renal dialysis: Secondary | ICD-10-CM | POA: Diagnosis not present

## 2018-02-03 DIAGNOSIS — Z992 Dependence on renal dialysis: Secondary | ICD-10-CM | POA: Diagnosis not present

## 2018-02-03 DIAGNOSIS — N186 End stage renal disease: Secondary | ICD-10-CM | POA: Diagnosis not present

## 2018-02-03 DIAGNOSIS — D631 Anemia in chronic kidney disease: Secondary | ICD-10-CM | POA: Diagnosis not present

## 2018-02-03 DIAGNOSIS — D509 Iron deficiency anemia, unspecified: Secondary | ICD-10-CM | POA: Diagnosis not present

## 2018-02-03 DIAGNOSIS — N2581 Secondary hyperparathyroidism of renal origin: Secondary | ICD-10-CM | POA: Diagnosis not present

## 2018-02-04 DIAGNOSIS — N2581 Secondary hyperparathyroidism of renal origin: Secondary | ICD-10-CM | POA: Diagnosis not present

## 2018-02-04 DIAGNOSIS — D509 Iron deficiency anemia, unspecified: Secondary | ICD-10-CM | POA: Diagnosis not present

## 2018-02-04 DIAGNOSIS — Z992 Dependence on renal dialysis: Secondary | ICD-10-CM | POA: Diagnosis not present

## 2018-02-04 DIAGNOSIS — D631 Anemia in chronic kidney disease: Secondary | ICD-10-CM | POA: Diagnosis not present

## 2018-02-04 DIAGNOSIS — N186 End stage renal disease: Secondary | ICD-10-CM | POA: Diagnosis not present

## 2018-02-05 DIAGNOSIS — D509 Iron deficiency anemia, unspecified: Secondary | ICD-10-CM | POA: Diagnosis not present

## 2018-02-05 DIAGNOSIS — N2581 Secondary hyperparathyroidism of renal origin: Secondary | ICD-10-CM | POA: Diagnosis not present

## 2018-02-05 DIAGNOSIS — D631 Anemia in chronic kidney disease: Secondary | ICD-10-CM | POA: Diagnosis not present

## 2018-02-05 DIAGNOSIS — Z992 Dependence on renal dialysis: Secondary | ICD-10-CM | POA: Diagnosis not present

## 2018-02-05 DIAGNOSIS — N186 End stage renal disease: Secondary | ICD-10-CM | POA: Diagnosis not present

## 2018-02-06 DIAGNOSIS — D631 Anemia in chronic kidney disease: Secondary | ICD-10-CM | POA: Diagnosis not present

## 2018-02-06 DIAGNOSIS — D509 Iron deficiency anemia, unspecified: Secondary | ICD-10-CM | POA: Diagnosis not present

## 2018-02-06 DIAGNOSIS — Z992 Dependence on renal dialysis: Secondary | ICD-10-CM | POA: Diagnosis not present

## 2018-02-06 DIAGNOSIS — N2581 Secondary hyperparathyroidism of renal origin: Secondary | ICD-10-CM | POA: Diagnosis not present

## 2018-02-06 DIAGNOSIS — N186 End stage renal disease: Secondary | ICD-10-CM | POA: Diagnosis not present

## 2018-02-07 DIAGNOSIS — N186 End stage renal disease: Secondary | ICD-10-CM | POA: Diagnosis not present

## 2018-02-07 DIAGNOSIS — D631 Anemia in chronic kidney disease: Secondary | ICD-10-CM | POA: Diagnosis not present

## 2018-02-07 DIAGNOSIS — N2581 Secondary hyperparathyroidism of renal origin: Secondary | ICD-10-CM | POA: Diagnosis not present

## 2018-02-07 DIAGNOSIS — Z992 Dependence on renal dialysis: Secondary | ICD-10-CM | POA: Diagnosis not present

## 2018-02-07 DIAGNOSIS — D509 Iron deficiency anemia, unspecified: Secondary | ICD-10-CM | POA: Diagnosis not present

## 2018-02-08 DIAGNOSIS — D509 Iron deficiency anemia, unspecified: Secondary | ICD-10-CM | POA: Diagnosis not present

## 2018-02-08 DIAGNOSIS — Z992 Dependence on renal dialysis: Secondary | ICD-10-CM | POA: Diagnosis not present

## 2018-02-08 DIAGNOSIS — N186 End stage renal disease: Secondary | ICD-10-CM | POA: Diagnosis not present

## 2018-02-08 DIAGNOSIS — D631 Anemia in chronic kidney disease: Secondary | ICD-10-CM | POA: Diagnosis not present

## 2018-02-08 DIAGNOSIS — N2581 Secondary hyperparathyroidism of renal origin: Secondary | ICD-10-CM | POA: Diagnosis not present

## 2018-02-09 DIAGNOSIS — N186 End stage renal disease: Secondary | ICD-10-CM | POA: Diagnosis not present

## 2018-02-09 DIAGNOSIS — D509 Iron deficiency anemia, unspecified: Secondary | ICD-10-CM | POA: Diagnosis not present

## 2018-02-09 DIAGNOSIS — N2581 Secondary hyperparathyroidism of renal origin: Secondary | ICD-10-CM | POA: Diagnosis not present

## 2018-02-09 DIAGNOSIS — D631 Anemia in chronic kidney disease: Secondary | ICD-10-CM | POA: Diagnosis not present

## 2018-02-09 DIAGNOSIS — Z992 Dependence on renal dialysis: Secondary | ICD-10-CM | POA: Diagnosis not present

## 2018-02-10 DIAGNOSIS — D509 Iron deficiency anemia, unspecified: Secondary | ICD-10-CM | POA: Diagnosis not present

## 2018-02-10 DIAGNOSIS — N2581 Secondary hyperparathyroidism of renal origin: Secondary | ICD-10-CM | POA: Diagnosis not present

## 2018-02-10 DIAGNOSIS — D631 Anemia in chronic kidney disease: Secondary | ICD-10-CM | POA: Diagnosis not present

## 2018-02-10 DIAGNOSIS — Z992 Dependence on renal dialysis: Secondary | ICD-10-CM | POA: Diagnosis not present

## 2018-02-10 DIAGNOSIS — N186 End stage renal disease: Secondary | ICD-10-CM | POA: Diagnosis not present

## 2018-02-11 DIAGNOSIS — N2581 Secondary hyperparathyroidism of renal origin: Secondary | ICD-10-CM | POA: Diagnosis not present

## 2018-02-11 DIAGNOSIS — N186 End stage renal disease: Secondary | ICD-10-CM | POA: Diagnosis not present

## 2018-02-11 DIAGNOSIS — D509 Iron deficiency anemia, unspecified: Secondary | ICD-10-CM | POA: Diagnosis not present

## 2018-02-11 DIAGNOSIS — Z992 Dependence on renal dialysis: Secondary | ICD-10-CM | POA: Diagnosis not present

## 2018-02-11 DIAGNOSIS — D631 Anemia in chronic kidney disease: Secondary | ICD-10-CM | POA: Diagnosis not present

## 2018-02-12 DIAGNOSIS — N2581 Secondary hyperparathyroidism of renal origin: Secondary | ICD-10-CM | POA: Diagnosis not present

## 2018-02-12 DIAGNOSIS — N186 End stage renal disease: Secondary | ICD-10-CM | POA: Diagnosis not present

## 2018-02-12 DIAGNOSIS — Z992 Dependence on renal dialysis: Secondary | ICD-10-CM | POA: Diagnosis not present

## 2018-02-12 DIAGNOSIS — D509 Iron deficiency anemia, unspecified: Secondary | ICD-10-CM | POA: Diagnosis not present

## 2018-02-12 DIAGNOSIS — D631 Anemia in chronic kidney disease: Secondary | ICD-10-CM | POA: Diagnosis not present

## 2018-02-13 DIAGNOSIS — D509 Iron deficiency anemia, unspecified: Secondary | ICD-10-CM | POA: Diagnosis not present

## 2018-02-13 DIAGNOSIS — N186 End stage renal disease: Secondary | ICD-10-CM | POA: Diagnosis not present

## 2018-02-13 DIAGNOSIS — N2581 Secondary hyperparathyroidism of renal origin: Secondary | ICD-10-CM | POA: Diagnosis not present

## 2018-02-13 DIAGNOSIS — D631 Anemia in chronic kidney disease: Secondary | ICD-10-CM | POA: Diagnosis not present

## 2018-02-13 DIAGNOSIS — Z992 Dependence on renal dialysis: Secondary | ICD-10-CM | POA: Diagnosis not present

## 2018-02-14 DIAGNOSIS — D509 Iron deficiency anemia, unspecified: Secondary | ICD-10-CM | POA: Diagnosis not present

## 2018-02-14 DIAGNOSIS — Z992 Dependence on renal dialysis: Secondary | ICD-10-CM | POA: Diagnosis not present

## 2018-02-14 DIAGNOSIS — N186 End stage renal disease: Secondary | ICD-10-CM | POA: Diagnosis not present

## 2018-02-14 DIAGNOSIS — N2581 Secondary hyperparathyroidism of renal origin: Secondary | ICD-10-CM | POA: Diagnosis not present

## 2018-02-14 DIAGNOSIS — D631 Anemia in chronic kidney disease: Secondary | ICD-10-CM | POA: Diagnosis not present

## 2018-02-15 DIAGNOSIS — D631 Anemia in chronic kidney disease: Secondary | ICD-10-CM | POA: Diagnosis not present

## 2018-02-15 DIAGNOSIS — N2581 Secondary hyperparathyroidism of renal origin: Secondary | ICD-10-CM | POA: Diagnosis not present

## 2018-02-15 DIAGNOSIS — D509 Iron deficiency anemia, unspecified: Secondary | ICD-10-CM | POA: Diagnosis not present

## 2018-02-15 DIAGNOSIS — Z992 Dependence on renal dialysis: Secondary | ICD-10-CM | POA: Diagnosis not present

## 2018-02-15 DIAGNOSIS — N186 End stage renal disease: Secondary | ICD-10-CM | POA: Diagnosis not present

## 2018-02-16 DIAGNOSIS — D631 Anemia in chronic kidney disease: Secondary | ICD-10-CM | POA: Diagnosis not present

## 2018-02-16 DIAGNOSIS — D509 Iron deficiency anemia, unspecified: Secondary | ICD-10-CM | POA: Diagnosis not present

## 2018-02-16 DIAGNOSIS — Z992 Dependence on renal dialysis: Secondary | ICD-10-CM | POA: Diagnosis not present

## 2018-02-16 DIAGNOSIS — N2581 Secondary hyperparathyroidism of renal origin: Secondary | ICD-10-CM | POA: Diagnosis not present

## 2018-02-16 DIAGNOSIS — N186 End stage renal disease: Secondary | ICD-10-CM | POA: Diagnosis not present

## 2018-02-17 DIAGNOSIS — N2581 Secondary hyperparathyroidism of renal origin: Secondary | ICD-10-CM | POA: Diagnosis not present

## 2018-02-17 DIAGNOSIS — Z992 Dependence on renal dialysis: Secondary | ICD-10-CM | POA: Diagnosis not present

## 2018-02-17 DIAGNOSIS — N186 End stage renal disease: Secondary | ICD-10-CM | POA: Diagnosis not present

## 2018-02-17 DIAGNOSIS — D631 Anemia in chronic kidney disease: Secondary | ICD-10-CM | POA: Diagnosis not present

## 2018-02-17 DIAGNOSIS — D509 Iron deficiency anemia, unspecified: Secondary | ICD-10-CM | POA: Diagnosis not present

## 2018-02-18 DIAGNOSIS — N186 End stage renal disease: Secondary | ICD-10-CM | POA: Diagnosis not present

## 2018-02-18 DIAGNOSIS — N2581 Secondary hyperparathyroidism of renal origin: Secondary | ICD-10-CM | POA: Diagnosis not present

## 2018-02-18 DIAGNOSIS — D509 Iron deficiency anemia, unspecified: Secondary | ICD-10-CM | POA: Diagnosis not present

## 2018-02-18 DIAGNOSIS — D631 Anemia in chronic kidney disease: Secondary | ICD-10-CM | POA: Diagnosis not present

## 2018-02-18 DIAGNOSIS — Z992 Dependence on renal dialysis: Secondary | ICD-10-CM | POA: Diagnosis not present

## 2018-02-19 DIAGNOSIS — N186 End stage renal disease: Secondary | ICD-10-CM | POA: Diagnosis not present

## 2018-02-19 DIAGNOSIS — N2581 Secondary hyperparathyroidism of renal origin: Secondary | ICD-10-CM | POA: Diagnosis not present

## 2018-02-19 DIAGNOSIS — D509 Iron deficiency anemia, unspecified: Secondary | ICD-10-CM | POA: Diagnosis not present

## 2018-02-19 DIAGNOSIS — Z992 Dependence on renal dialysis: Secondary | ICD-10-CM | POA: Diagnosis not present

## 2018-02-20 DIAGNOSIS — N2581 Secondary hyperparathyroidism of renal origin: Secondary | ICD-10-CM | POA: Diagnosis not present

## 2018-02-20 DIAGNOSIS — Z992 Dependence on renal dialysis: Secondary | ICD-10-CM | POA: Diagnosis not present

## 2018-02-20 DIAGNOSIS — D509 Iron deficiency anemia, unspecified: Secondary | ICD-10-CM | POA: Diagnosis not present

## 2018-02-20 DIAGNOSIS — N186 End stage renal disease: Secondary | ICD-10-CM | POA: Diagnosis not present

## 2018-02-21 DIAGNOSIS — Z992 Dependence on renal dialysis: Secondary | ICD-10-CM | POA: Diagnosis not present

## 2018-02-21 DIAGNOSIS — D509 Iron deficiency anemia, unspecified: Secondary | ICD-10-CM | POA: Diagnosis not present

## 2018-02-21 DIAGNOSIS — N186 End stage renal disease: Secondary | ICD-10-CM | POA: Diagnosis not present

## 2018-02-21 DIAGNOSIS — N2581 Secondary hyperparathyroidism of renal origin: Secondary | ICD-10-CM | POA: Diagnosis not present

## 2018-02-22 DIAGNOSIS — Z992 Dependence on renal dialysis: Secondary | ICD-10-CM | POA: Diagnosis not present

## 2018-02-22 DIAGNOSIS — N186 End stage renal disease: Secondary | ICD-10-CM | POA: Diagnosis not present

## 2018-02-22 DIAGNOSIS — N2581 Secondary hyperparathyroidism of renal origin: Secondary | ICD-10-CM | POA: Diagnosis not present

## 2018-02-22 DIAGNOSIS — D509 Iron deficiency anemia, unspecified: Secondary | ICD-10-CM | POA: Diagnosis not present

## 2018-02-23 DIAGNOSIS — N2581 Secondary hyperparathyroidism of renal origin: Secondary | ICD-10-CM | POA: Diagnosis not present

## 2018-02-23 DIAGNOSIS — Z992 Dependence on renal dialysis: Secondary | ICD-10-CM | POA: Diagnosis not present

## 2018-02-23 DIAGNOSIS — N186 End stage renal disease: Secondary | ICD-10-CM | POA: Diagnosis not present

## 2018-02-23 DIAGNOSIS — D509 Iron deficiency anemia, unspecified: Secondary | ICD-10-CM | POA: Diagnosis not present

## 2018-02-24 DIAGNOSIS — N186 End stage renal disease: Secondary | ICD-10-CM | POA: Diagnosis not present

## 2018-02-24 DIAGNOSIS — D509 Iron deficiency anemia, unspecified: Secondary | ICD-10-CM | POA: Diagnosis not present

## 2018-02-24 DIAGNOSIS — N2581 Secondary hyperparathyroidism of renal origin: Secondary | ICD-10-CM | POA: Diagnosis not present

## 2018-02-24 DIAGNOSIS — Z992 Dependence on renal dialysis: Secondary | ICD-10-CM | POA: Diagnosis not present

## 2018-02-25 DIAGNOSIS — N2581 Secondary hyperparathyroidism of renal origin: Secondary | ICD-10-CM | POA: Diagnosis not present

## 2018-02-25 DIAGNOSIS — N186 End stage renal disease: Secondary | ICD-10-CM | POA: Diagnosis not present

## 2018-02-25 DIAGNOSIS — Z992 Dependence on renal dialysis: Secondary | ICD-10-CM | POA: Diagnosis not present

## 2018-02-25 DIAGNOSIS — D509 Iron deficiency anemia, unspecified: Secondary | ICD-10-CM | POA: Diagnosis not present

## 2018-02-26 DIAGNOSIS — N2581 Secondary hyperparathyroidism of renal origin: Secondary | ICD-10-CM | POA: Diagnosis not present

## 2018-02-26 DIAGNOSIS — Z992 Dependence on renal dialysis: Secondary | ICD-10-CM | POA: Diagnosis not present

## 2018-02-26 DIAGNOSIS — N186 End stage renal disease: Secondary | ICD-10-CM | POA: Diagnosis not present

## 2018-02-26 DIAGNOSIS — D509 Iron deficiency anemia, unspecified: Secondary | ICD-10-CM | POA: Diagnosis not present

## 2018-02-27 DIAGNOSIS — N2581 Secondary hyperparathyroidism of renal origin: Secondary | ICD-10-CM | POA: Diagnosis not present

## 2018-02-27 DIAGNOSIS — Z992 Dependence on renal dialysis: Secondary | ICD-10-CM | POA: Diagnosis not present

## 2018-02-27 DIAGNOSIS — D509 Iron deficiency anemia, unspecified: Secondary | ICD-10-CM | POA: Diagnosis not present

## 2018-02-27 DIAGNOSIS — N186 End stage renal disease: Secondary | ICD-10-CM | POA: Diagnosis not present

## 2018-02-28 DIAGNOSIS — N2581 Secondary hyperparathyroidism of renal origin: Secondary | ICD-10-CM | POA: Diagnosis not present

## 2018-02-28 DIAGNOSIS — Z992 Dependence on renal dialysis: Secondary | ICD-10-CM | POA: Diagnosis not present

## 2018-02-28 DIAGNOSIS — D509 Iron deficiency anemia, unspecified: Secondary | ICD-10-CM | POA: Diagnosis not present

## 2018-02-28 DIAGNOSIS — N186 End stage renal disease: Secondary | ICD-10-CM | POA: Diagnosis not present

## 2018-03-01 DIAGNOSIS — N186 End stage renal disease: Secondary | ICD-10-CM | POA: Diagnosis not present

## 2018-03-01 DIAGNOSIS — N2581 Secondary hyperparathyroidism of renal origin: Secondary | ICD-10-CM | POA: Diagnosis not present

## 2018-03-01 DIAGNOSIS — Z992 Dependence on renal dialysis: Secondary | ICD-10-CM | POA: Diagnosis not present

## 2018-03-01 DIAGNOSIS — D509 Iron deficiency anemia, unspecified: Secondary | ICD-10-CM | POA: Diagnosis not present

## 2018-03-02 DIAGNOSIS — N2581 Secondary hyperparathyroidism of renal origin: Secondary | ICD-10-CM | POA: Diagnosis not present

## 2018-03-02 DIAGNOSIS — D509 Iron deficiency anemia, unspecified: Secondary | ICD-10-CM | POA: Diagnosis not present

## 2018-03-02 DIAGNOSIS — Z992 Dependence on renal dialysis: Secondary | ICD-10-CM | POA: Diagnosis not present

## 2018-03-02 DIAGNOSIS — N186 End stage renal disease: Secondary | ICD-10-CM | POA: Diagnosis not present

## 2018-03-03 DIAGNOSIS — N2581 Secondary hyperparathyroidism of renal origin: Secondary | ICD-10-CM | POA: Diagnosis not present

## 2018-03-03 DIAGNOSIS — D509 Iron deficiency anemia, unspecified: Secondary | ICD-10-CM | POA: Diagnosis not present

## 2018-03-03 DIAGNOSIS — Z992 Dependence on renal dialysis: Secondary | ICD-10-CM | POA: Diagnosis not present

## 2018-03-03 DIAGNOSIS — N186 End stage renal disease: Secondary | ICD-10-CM | POA: Diagnosis not present

## 2018-03-04 DIAGNOSIS — N186 End stage renal disease: Secondary | ICD-10-CM | POA: Diagnosis not present

## 2018-03-04 DIAGNOSIS — Z992 Dependence on renal dialysis: Secondary | ICD-10-CM | POA: Diagnosis not present

## 2018-03-04 DIAGNOSIS — N2581 Secondary hyperparathyroidism of renal origin: Secondary | ICD-10-CM | POA: Diagnosis not present

## 2018-03-04 DIAGNOSIS — D509 Iron deficiency anemia, unspecified: Secondary | ICD-10-CM | POA: Diagnosis not present

## 2018-03-05 DIAGNOSIS — Z992 Dependence on renal dialysis: Secondary | ICD-10-CM | POA: Diagnosis not present

## 2018-03-05 DIAGNOSIS — N2581 Secondary hyperparathyroidism of renal origin: Secondary | ICD-10-CM | POA: Diagnosis not present

## 2018-03-05 DIAGNOSIS — D509 Iron deficiency anemia, unspecified: Secondary | ICD-10-CM | POA: Diagnosis not present

## 2018-03-05 DIAGNOSIS — N186 End stage renal disease: Secondary | ICD-10-CM | POA: Diagnosis not present

## 2018-03-06 DIAGNOSIS — N186 End stage renal disease: Secondary | ICD-10-CM | POA: Diagnosis not present

## 2018-03-06 DIAGNOSIS — Z992 Dependence on renal dialysis: Secondary | ICD-10-CM | POA: Diagnosis not present

## 2018-03-06 DIAGNOSIS — D509 Iron deficiency anemia, unspecified: Secondary | ICD-10-CM | POA: Diagnosis not present

## 2018-03-06 DIAGNOSIS — N2581 Secondary hyperparathyroidism of renal origin: Secondary | ICD-10-CM | POA: Diagnosis not present

## 2018-03-07 DIAGNOSIS — D509 Iron deficiency anemia, unspecified: Secondary | ICD-10-CM | POA: Diagnosis not present

## 2018-03-07 DIAGNOSIS — N186 End stage renal disease: Secondary | ICD-10-CM | POA: Diagnosis not present

## 2018-03-07 DIAGNOSIS — N2581 Secondary hyperparathyroidism of renal origin: Secondary | ICD-10-CM | POA: Diagnosis not present

## 2018-03-07 DIAGNOSIS — Z992 Dependence on renal dialysis: Secondary | ICD-10-CM | POA: Diagnosis not present

## 2018-03-08 DIAGNOSIS — D509 Iron deficiency anemia, unspecified: Secondary | ICD-10-CM | POA: Diagnosis not present

## 2018-03-08 DIAGNOSIS — N2581 Secondary hyperparathyroidism of renal origin: Secondary | ICD-10-CM | POA: Diagnosis not present

## 2018-03-08 DIAGNOSIS — N186 End stage renal disease: Secondary | ICD-10-CM | POA: Diagnosis not present

## 2018-03-08 DIAGNOSIS — Z992 Dependence on renal dialysis: Secondary | ICD-10-CM | POA: Diagnosis not present

## 2018-03-09 DIAGNOSIS — N186 End stage renal disease: Secondary | ICD-10-CM | POA: Diagnosis not present

## 2018-03-09 DIAGNOSIS — N2581 Secondary hyperparathyroidism of renal origin: Secondary | ICD-10-CM | POA: Diagnosis not present

## 2018-03-09 DIAGNOSIS — Z992 Dependence on renal dialysis: Secondary | ICD-10-CM | POA: Diagnosis not present

## 2018-03-09 DIAGNOSIS — D509 Iron deficiency anemia, unspecified: Secondary | ICD-10-CM | POA: Diagnosis not present

## 2018-03-10 DIAGNOSIS — D509 Iron deficiency anemia, unspecified: Secondary | ICD-10-CM | POA: Diagnosis not present

## 2018-03-10 DIAGNOSIS — Z992 Dependence on renal dialysis: Secondary | ICD-10-CM | POA: Diagnosis not present

## 2018-03-10 DIAGNOSIS — N186 End stage renal disease: Secondary | ICD-10-CM | POA: Diagnosis not present

## 2018-03-10 DIAGNOSIS — N2581 Secondary hyperparathyroidism of renal origin: Secondary | ICD-10-CM | POA: Diagnosis not present

## 2018-03-11 DIAGNOSIS — Z992 Dependence on renal dialysis: Secondary | ICD-10-CM | POA: Diagnosis not present

## 2018-03-11 DIAGNOSIS — D509 Iron deficiency anemia, unspecified: Secondary | ICD-10-CM | POA: Diagnosis not present

## 2018-03-11 DIAGNOSIS — N2581 Secondary hyperparathyroidism of renal origin: Secondary | ICD-10-CM | POA: Diagnosis not present

## 2018-03-11 DIAGNOSIS — N186 End stage renal disease: Secondary | ICD-10-CM | POA: Diagnosis not present

## 2018-03-12 DIAGNOSIS — N2581 Secondary hyperparathyroidism of renal origin: Secondary | ICD-10-CM | POA: Diagnosis not present

## 2018-03-12 DIAGNOSIS — N186 End stage renal disease: Secondary | ICD-10-CM | POA: Diagnosis not present

## 2018-03-12 DIAGNOSIS — Z992 Dependence on renal dialysis: Secondary | ICD-10-CM | POA: Diagnosis not present

## 2018-03-12 DIAGNOSIS — D509 Iron deficiency anemia, unspecified: Secondary | ICD-10-CM | POA: Diagnosis not present

## 2018-03-13 DIAGNOSIS — N186 End stage renal disease: Secondary | ICD-10-CM | POA: Diagnosis not present

## 2018-03-13 DIAGNOSIS — D509 Iron deficiency anemia, unspecified: Secondary | ICD-10-CM | POA: Diagnosis not present

## 2018-03-13 DIAGNOSIS — Z992 Dependence on renal dialysis: Secondary | ICD-10-CM | POA: Diagnosis not present

## 2018-03-13 DIAGNOSIS — N2581 Secondary hyperparathyroidism of renal origin: Secondary | ICD-10-CM | POA: Diagnosis not present

## 2018-03-14 DIAGNOSIS — D509 Iron deficiency anemia, unspecified: Secondary | ICD-10-CM | POA: Diagnosis not present

## 2018-03-14 DIAGNOSIS — N2581 Secondary hyperparathyroidism of renal origin: Secondary | ICD-10-CM | POA: Diagnosis not present

## 2018-03-14 DIAGNOSIS — N186 End stage renal disease: Secondary | ICD-10-CM | POA: Diagnosis not present

## 2018-03-14 DIAGNOSIS — Z992 Dependence on renal dialysis: Secondary | ICD-10-CM | POA: Diagnosis not present

## 2018-03-15 DIAGNOSIS — D509 Iron deficiency anemia, unspecified: Secondary | ICD-10-CM | POA: Diagnosis not present

## 2018-03-15 DIAGNOSIS — N186 End stage renal disease: Secondary | ICD-10-CM | POA: Diagnosis not present

## 2018-03-15 DIAGNOSIS — N2581 Secondary hyperparathyroidism of renal origin: Secondary | ICD-10-CM | POA: Diagnosis not present

## 2018-03-15 DIAGNOSIS — Z992 Dependence on renal dialysis: Secondary | ICD-10-CM | POA: Diagnosis not present

## 2018-03-16 DIAGNOSIS — N185 Chronic kidney disease, stage 5: Secondary | ICD-10-CM | POA: Diagnosis not present

## 2018-03-16 DIAGNOSIS — I10 Essential (primary) hypertension: Secondary | ICD-10-CM | POA: Diagnosis not present

## 2018-03-16 DIAGNOSIS — D509 Iron deficiency anemia, unspecified: Secondary | ICD-10-CM | POA: Diagnosis not present

## 2018-03-16 DIAGNOSIS — N401 Enlarged prostate with lower urinary tract symptoms: Secondary | ICD-10-CM | POA: Diagnosis not present

## 2018-03-16 DIAGNOSIS — K210 Gastro-esophageal reflux disease with esophagitis: Secondary | ICD-10-CM | POA: Diagnosis not present

## 2018-03-16 DIAGNOSIS — N2581 Secondary hyperparathyroidism of renal origin: Secondary | ICD-10-CM | POA: Diagnosis not present

## 2018-03-16 DIAGNOSIS — Z992 Dependence on renal dialysis: Secondary | ICD-10-CM | POA: Diagnosis not present

## 2018-03-16 DIAGNOSIS — D508 Other iron deficiency anemias: Secondary | ICD-10-CM | POA: Diagnosis not present

## 2018-03-16 DIAGNOSIS — N186 End stage renal disease: Secondary | ICD-10-CM | POA: Diagnosis not present

## 2018-03-17 DIAGNOSIS — Z992 Dependence on renal dialysis: Secondary | ICD-10-CM | POA: Diagnosis not present

## 2018-03-17 DIAGNOSIS — N186 End stage renal disease: Secondary | ICD-10-CM | POA: Diagnosis not present

## 2018-03-17 DIAGNOSIS — D509 Iron deficiency anemia, unspecified: Secondary | ICD-10-CM | POA: Diagnosis not present

## 2018-03-17 DIAGNOSIS — N2581 Secondary hyperparathyroidism of renal origin: Secondary | ICD-10-CM | POA: Diagnosis not present

## 2018-03-18 DIAGNOSIS — N2581 Secondary hyperparathyroidism of renal origin: Secondary | ICD-10-CM | POA: Diagnosis not present

## 2018-03-18 DIAGNOSIS — N186 End stage renal disease: Secondary | ICD-10-CM | POA: Diagnosis not present

## 2018-03-18 DIAGNOSIS — Z992 Dependence on renal dialysis: Secondary | ICD-10-CM | POA: Diagnosis not present

## 2018-03-18 DIAGNOSIS — D509 Iron deficiency anemia, unspecified: Secondary | ICD-10-CM | POA: Diagnosis not present

## 2018-03-19 DIAGNOSIS — N186 End stage renal disease: Secondary | ICD-10-CM | POA: Diagnosis not present

## 2018-03-19 DIAGNOSIS — Z992 Dependence on renal dialysis: Secondary | ICD-10-CM | POA: Diagnosis not present

## 2018-03-19 DIAGNOSIS — N2581 Secondary hyperparathyroidism of renal origin: Secondary | ICD-10-CM | POA: Diagnosis not present

## 2018-03-19 DIAGNOSIS — D509 Iron deficiency anemia, unspecified: Secondary | ICD-10-CM | POA: Diagnosis not present

## 2018-03-20 DIAGNOSIS — N186 End stage renal disease: Secondary | ICD-10-CM | POA: Diagnosis not present

## 2018-03-20 DIAGNOSIS — D509 Iron deficiency anemia, unspecified: Secondary | ICD-10-CM | POA: Diagnosis not present

## 2018-03-20 DIAGNOSIS — Z992 Dependence on renal dialysis: Secondary | ICD-10-CM | POA: Diagnosis not present

## 2018-03-20 DIAGNOSIS — N2581 Secondary hyperparathyroidism of renal origin: Secondary | ICD-10-CM | POA: Diagnosis not present

## 2018-03-21 DIAGNOSIS — Z992 Dependence on renal dialysis: Secondary | ICD-10-CM | POA: Diagnosis not present

## 2018-03-21 DIAGNOSIS — N186 End stage renal disease: Secondary | ICD-10-CM | POA: Diagnosis not present

## 2018-03-21 DIAGNOSIS — N2581 Secondary hyperparathyroidism of renal origin: Secondary | ICD-10-CM | POA: Diagnosis not present

## 2018-03-21 DIAGNOSIS — D509 Iron deficiency anemia, unspecified: Secondary | ICD-10-CM | POA: Diagnosis not present

## 2018-03-22 DIAGNOSIS — D509 Iron deficiency anemia, unspecified: Secondary | ICD-10-CM | POA: Diagnosis not present

## 2018-03-22 DIAGNOSIS — N2581 Secondary hyperparathyroidism of renal origin: Secondary | ICD-10-CM | POA: Diagnosis not present

## 2018-03-22 DIAGNOSIS — Z992 Dependence on renal dialysis: Secondary | ICD-10-CM | POA: Diagnosis not present

## 2018-03-22 DIAGNOSIS — N186 End stage renal disease: Secondary | ICD-10-CM | POA: Diagnosis not present

## 2018-03-22 DIAGNOSIS — D631 Anemia in chronic kidney disease: Secondary | ICD-10-CM | POA: Diagnosis not present

## 2018-03-23 DIAGNOSIS — N2581 Secondary hyperparathyroidism of renal origin: Secondary | ICD-10-CM | POA: Diagnosis not present

## 2018-03-23 DIAGNOSIS — D509 Iron deficiency anemia, unspecified: Secondary | ICD-10-CM | POA: Diagnosis not present

## 2018-03-23 DIAGNOSIS — N186 End stage renal disease: Secondary | ICD-10-CM | POA: Diagnosis not present

## 2018-03-23 DIAGNOSIS — D631 Anemia in chronic kidney disease: Secondary | ICD-10-CM | POA: Diagnosis not present

## 2018-03-23 DIAGNOSIS — Z992 Dependence on renal dialysis: Secondary | ICD-10-CM | POA: Diagnosis not present

## 2018-03-24 DIAGNOSIS — N186 End stage renal disease: Secondary | ICD-10-CM | POA: Diagnosis not present

## 2018-03-24 DIAGNOSIS — D509 Iron deficiency anemia, unspecified: Secondary | ICD-10-CM | POA: Diagnosis not present

## 2018-03-24 DIAGNOSIS — Z992 Dependence on renal dialysis: Secondary | ICD-10-CM | POA: Diagnosis not present

## 2018-03-24 DIAGNOSIS — N2581 Secondary hyperparathyroidism of renal origin: Secondary | ICD-10-CM | POA: Diagnosis not present

## 2018-03-24 DIAGNOSIS — D631 Anemia in chronic kidney disease: Secondary | ICD-10-CM | POA: Diagnosis not present

## 2018-03-25 DIAGNOSIS — N186 End stage renal disease: Secondary | ICD-10-CM | POA: Diagnosis not present

## 2018-03-25 DIAGNOSIS — D631 Anemia in chronic kidney disease: Secondary | ICD-10-CM | POA: Diagnosis not present

## 2018-03-25 DIAGNOSIS — D509 Iron deficiency anemia, unspecified: Secondary | ICD-10-CM | POA: Diagnosis not present

## 2018-03-25 DIAGNOSIS — Z992 Dependence on renal dialysis: Secondary | ICD-10-CM | POA: Diagnosis not present

## 2018-03-25 DIAGNOSIS — N2581 Secondary hyperparathyroidism of renal origin: Secondary | ICD-10-CM | POA: Diagnosis not present

## 2018-03-26 DIAGNOSIS — Z992 Dependence on renal dialysis: Secondary | ICD-10-CM | POA: Diagnosis not present

## 2018-03-26 DIAGNOSIS — N186 End stage renal disease: Secondary | ICD-10-CM | POA: Diagnosis not present

## 2018-03-26 DIAGNOSIS — D509 Iron deficiency anemia, unspecified: Secondary | ICD-10-CM | POA: Diagnosis not present

## 2018-03-26 DIAGNOSIS — D631 Anemia in chronic kidney disease: Secondary | ICD-10-CM | POA: Diagnosis not present

## 2018-03-26 DIAGNOSIS — N2581 Secondary hyperparathyroidism of renal origin: Secondary | ICD-10-CM | POA: Diagnosis not present

## 2018-03-27 DIAGNOSIS — Z992 Dependence on renal dialysis: Secondary | ICD-10-CM | POA: Diagnosis not present

## 2018-03-27 DIAGNOSIS — D631 Anemia in chronic kidney disease: Secondary | ICD-10-CM | POA: Diagnosis not present

## 2018-03-27 DIAGNOSIS — N2581 Secondary hyperparathyroidism of renal origin: Secondary | ICD-10-CM | POA: Diagnosis not present

## 2018-03-27 DIAGNOSIS — N186 End stage renal disease: Secondary | ICD-10-CM | POA: Diagnosis not present

## 2018-03-27 DIAGNOSIS — D509 Iron deficiency anemia, unspecified: Secondary | ICD-10-CM | POA: Diagnosis not present

## 2018-03-28 DIAGNOSIS — D509 Iron deficiency anemia, unspecified: Secondary | ICD-10-CM | POA: Diagnosis not present

## 2018-03-28 DIAGNOSIS — N2581 Secondary hyperparathyroidism of renal origin: Secondary | ICD-10-CM | POA: Diagnosis not present

## 2018-03-28 DIAGNOSIS — D631 Anemia in chronic kidney disease: Secondary | ICD-10-CM | POA: Diagnosis not present

## 2018-03-28 DIAGNOSIS — Z992 Dependence on renal dialysis: Secondary | ICD-10-CM | POA: Diagnosis not present

## 2018-03-28 DIAGNOSIS — N186 End stage renal disease: Secondary | ICD-10-CM | POA: Diagnosis not present

## 2018-03-29 DIAGNOSIS — D631 Anemia in chronic kidney disease: Secondary | ICD-10-CM | POA: Diagnosis not present

## 2018-03-29 DIAGNOSIS — N2581 Secondary hyperparathyroidism of renal origin: Secondary | ICD-10-CM | POA: Diagnosis not present

## 2018-03-29 DIAGNOSIS — N186 End stage renal disease: Secondary | ICD-10-CM | POA: Diagnosis not present

## 2018-03-29 DIAGNOSIS — D509 Iron deficiency anemia, unspecified: Secondary | ICD-10-CM | POA: Diagnosis not present

## 2018-03-29 DIAGNOSIS — Z992 Dependence on renal dialysis: Secondary | ICD-10-CM | POA: Diagnosis not present

## 2018-03-30 DIAGNOSIS — D509 Iron deficiency anemia, unspecified: Secondary | ICD-10-CM | POA: Diagnosis not present

## 2018-03-30 DIAGNOSIS — Z1159 Encounter for screening for other viral diseases: Secondary | ICD-10-CM | POA: Diagnosis not present

## 2018-03-30 DIAGNOSIS — N2581 Secondary hyperparathyroidism of renal origin: Secondary | ICD-10-CM | POA: Diagnosis not present

## 2018-03-30 DIAGNOSIS — Z992 Dependence on renal dialysis: Secondary | ICD-10-CM | POA: Diagnosis not present

## 2018-03-30 DIAGNOSIS — N186 End stage renal disease: Secondary | ICD-10-CM | POA: Diagnosis not present

## 2018-03-30 DIAGNOSIS — D631 Anemia in chronic kidney disease: Secondary | ICD-10-CM | POA: Diagnosis not present

## 2018-03-31 DIAGNOSIS — N2581 Secondary hyperparathyroidism of renal origin: Secondary | ICD-10-CM | POA: Diagnosis not present

## 2018-03-31 DIAGNOSIS — D631 Anemia in chronic kidney disease: Secondary | ICD-10-CM | POA: Diagnosis not present

## 2018-03-31 DIAGNOSIS — N186 End stage renal disease: Secondary | ICD-10-CM | POA: Diagnosis not present

## 2018-03-31 DIAGNOSIS — D509 Iron deficiency anemia, unspecified: Secondary | ICD-10-CM | POA: Diagnosis not present

## 2018-03-31 DIAGNOSIS — Z992 Dependence on renal dialysis: Secondary | ICD-10-CM | POA: Diagnosis not present

## 2018-04-01 DIAGNOSIS — D631 Anemia in chronic kidney disease: Secondary | ICD-10-CM | POA: Diagnosis not present

## 2018-04-01 DIAGNOSIS — Z992 Dependence on renal dialysis: Secondary | ICD-10-CM | POA: Diagnosis not present

## 2018-04-01 DIAGNOSIS — D509 Iron deficiency anemia, unspecified: Secondary | ICD-10-CM | POA: Diagnosis not present

## 2018-04-01 DIAGNOSIS — N2581 Secondary hyperparathyroidism of renal origin: Secondary | ICD-10-CM | POA: Diagnosis not present

## 2018-04-01 DIAGNOSIS — N186 End stage renal disease: Secondary | ICD-10-CM | POA: Diagnosis not present

## 2018-04-02 DIAGNOSIS — D509 Iron deficiency anemia, unspecified: Secondary | ICD-10-CM | POA: Diagnosis not present

## 2018-04-02 DIAGNOSIS — D631 Anemia in chronic kidney disease: Secondary | ICD-10-CM | POA: Diagnosis not present

## 2018-04-02 DIAGNOSIS — N2581 Secondary hyperparathyroidism of renal origin: Secondary | ICD-10-CM | POA: Diagnosis not present

## 2018-04-02 DIAGNOSIS — N186 End stage renal disease: Secondary | ICD-10-CM | POA: Diagnosis not present

## 2018-04-02 DIAGNOSIS — Z992 Dependence on renal dialysis: Secondary | ICD-10-CM | POA: Diagnosis not present

## 2018-04-03 DIAGNOSIS — Z992 Dependence on renal dialysis: Secondary | ICD-10-CM | POA: Diagnosis not present

## 2018-04-03 DIAGNOSIS — N2581 Secondary hyperparathyroidism of renal origin: Secondary | ICD-10-CM | POA: Diagnosis not present

## 2018-04-03 DIAGNOSIS — N186 End stage renal disease: Secondary | ICD-10-CM | POA: Diagnosis not present

## 2018-04-03 DIAGNOSIS — D509 Iron deficiency anemia, unspecified: Secondary | ICD-10-CM | POA: Diagnosis not present

## 2018-04-03 DIAGNOSIS — D631 Anemia in chronic kidney disease: Secondary | ICD-10-CM | POA: Diagnosis not present

## 2018-04-04 DIAGNOSIS — Z992 Dependence on renal dialysis: Secondary | ICD-10-CM | POA: Diagnosis not present

## 2018-04-04 DIAGNOSIS — D509 Iron deficiency anemia, unspecified: Secondary | ICD-10-CM | POA: Diagnosis not present

## 2018-04-04 DIAGNOSIS — N2581 Secondary hyperparathyroidism of renal origin: Secondary | ICD-10-CM | POA: Diagnosis not present

## 2018-04-04 DIAGNOSIS — N186 End stage renal disease: Secondary | ICD-10-CM | POA: Diagnosis not present

## 2018-04-04 DIAGNOSIS — D631 Anemia in chronic kidney disease: Secondary | ICD-10-CM | POA: Diagnosis not present

## 2018-04-05 DIAGNOSIS — D509 Iron deficiency anemia, unspecified: Secondary | ICD-10-CM | POA: Diagnosis not present

## 2018-04-05 DIAGNOSIS — Z992 Dependence on renal dialysis: Secondary | ICD-10-CM | POA: Diagnosis not present

## 2018-04-05 DIAGNOSIS — N2581 Secondary hyperparathyroidism of renal origin: Secondary | ICD-10-CM | POA: Diagnosis not present

## 2018-04-05 DIAGNOSIS — D631 Anemia in chronic kidney disease: Secondary | ICD-10-CM | POA: Diagnosis not present

## 2018-04-05 DIAGNOSIS — N186 End stage renal disease: Secondary | ICD-10-CM | POA: Diagnosis not present

## 2018-04-06 DIAGNOSIS — Z992 Dependence on renal dialysis: Secondary | ICD-10-CM | POA: Diagnosis not present

## 2018-04-06 DIAGNOSIS — D631 Anemia in chronic kidney disease: Secondary | ICD-10-CM | POA: Diagnosis not present

## 2018-04-06 DIAGNOSIS — N186 End stage renal disease: Secondary | ICD-10-CM | POA: Diagnosis not present

## 2018-04-06 DIAGNOSIS — N2581 Secondary hyperparathyroidism of renal origin: Secondary | ICD-10-CM | POA: Diagnosis not present

## 2018-04-06 DIAGNOSIS — D509 Iron deficiency anemia, unspecified: Secondary | ICD-10-CM | POA: Diagnosis not present

## 2018-04-07 DIAGNOSIS — D631 Anemia in chronic kidney disease: Secondary | ICD-10-CM | POA: Diagnosis not present

## 2018-04-07 DIAGNOSIS — Z992 Dependence on renal dialysis: Secondary | ICD-10-CM | POA: Diagnosis not present

## 2018-04-07 DIAGNOSIS — N2581 Secondary hyperparathyroidism of renal origin: Secondary | ICD-10-CM | POA: Diagnosis not present

## 2018-04-07 DIAGNOSIS — D509 Iron deficiency anemia, unspecified: Secondary | ICD-10-CM | POA: Diagnosis not present

## 2018-04-07 DIAGNOSIS — N186 End stage renal disease: Secondary | ICD-10-CM | POA: Diagnosis not present

## 2018-04-08 DIAGNOSIS — D509 Iron deficiency anemia, unspecified: Secondary | ICD-10-CM | POA: Diagnosis not present

## 2018-04-08 DIAGNOSIS — N186 End stage renal disease: Secondary | ICD-10-CM | POA: Diagnosis not present

## 2018-04-08 DIAGNOSIS — N2581 Secondary hyperparathyroidism of renal origin: Secondary | ICD-10-CM | POA: Diagnosis not present

## 2018-04-08 DIAGNOSIS — D631 Anemia in chronic kidney disease: Secondary | ICD-10-CM | POA: Diagnosis not present

## 2018-04-08 DIAGNOSIS — Z992 Dependence on renal dialysis: Secondary | ICD-10-CM | POA: Diagnosis not present

## 2018-04-09 DIAGNOSIS — N186 End stage renal disease: Secondary | ICD-10-CM | POA: Diagnosis not present

## 2018-04-09 DIAGNOSIS — D631 Anemia in chronic kidney disease: Secondary | ICD-10-CM | POA: Diagnosis not present

## 2018-04-09 DIAGNOSIS — Z992 Dependence on renal dialysis: Secondary | ICD-10-CM | POA: Diagnosis not present

## 2018-04-09 DIAGNOSIS — N2581 Secondary hyperparathyroidism of renal origin: Secondary | ICD-10-CM | POA: Diagnosis not present

## 2018-04-09 DIAGNOSIS — D509 Iron deficiency anemia, unspecified: Secondary | ICD-10-CM | POA: Diagnosis not present

## 2018-04-10 DIAGNOSIS — N186 End stage renal disease: Secondary | ICD-10-CM | POA: Diagnosis not present

## 2018-04-10 DIAGNOSIS — D631 Anemia in chronic kidney disease: Secondary | ICD-10-CM | POA: Diagnosis not present

## 2018-04-10 DIAGNOSIS — N2581 Secondary hyperparathyroidism of renal origin: Secondary | ICD-10-CM | POA: Diagnosis not present

## 2018-04-10 DIAGNOSIS — D509 Iron deficiency anemia, unspecified: Secondary | ICD-10-CM | POA: Diagnosis not present

## 2018-04-10 DIAGNOSIS — Z992 Dependence on renal dialysis: Secondary | ICD-10-CM | POA: Diagnosis not present

## 2018-04-11 DIAGNOSIS — Z992 Dependence on renal dialysis: Secondary | ICD-10-CM | POA: Diagnosis not present

## 2018-04-11 DIAGNOSIS — D631 Anemia in chronic kidney disease: Secondary | ICD-10-CM | POA: Diagnosis not present

## 2018-04-11 DIAGNOSIS — D509 Iron deficiency anemia, unspecified: Secondary | ICD-10-CM | POA: Diagnosis not present

## 2018-04-11 DIAGNOSIS — N2581 Secondary hyperparathyroidism of renal origin: Secondary | ICD-10-CM | POA: Diagnosis not present

## 2018-04-11 DIAGNOSIS — N186 End stage renal disease: Secondary | ICD-10-CM | POA: Diagnosis not present

## 2018-04-12 DIAGNOSIS — D509 Iron deficiency anemia, unspecified: Secondary | ICD-10-CM | POA: Diagnosis not present

## 2018-04-12 DIAGNOSIS — D631 Anemia in chronic kidney disease: Secondary | ICD-10-CM | POA: Diagnosis not present

## 2018-04-12 DIAGNOSIS — N186 End stage renal disease: Secondary | ICD-10-CM | POA: Diagnosis not present

## 2018-04-12 DIAGNOSIS — Z992 Dependence on renal dialysis: Secondary | ICD-10-CM | POA: Diagnosis not present

## 2018-04-12 DIAGNOSIS — N2581 Secondary hyperparathyroidism of renal origin: Secondary | ICD-10-CM | POA: Diagnosis not present

## 2018-04-13 DIAGNOSIS — N186 End stage renal disease: Secondary | ICD-10-CM | POA: Diagnosis not present

## 2018-04-13 DIAGNOSIS — D509 Iron deficiency anemia, unspecified: Secondary | ICD-10-CM | POA: Diagnosis not present

## 2018-04-13 DIAGNOSIS — Z992 Dependence on renal dialysis: Secondary | ICD-10-CM | POA: Diagnosis not present

## 2018-04-13 DIAGNOSIS — N2581 Secondary hyperparathyroidism of renal origin: Secondary | ICD-10-CM | POA: Diagnosis not present

## 2018-04-13 DIAGNOSIS — D631 Anemia in chronic kidney disease: Secondary | ICD-10-CM | POA: Diagnosis not present

## 2018-04-14 DIAGNOSIS — Z992 Dependence on renal dialysis: Secondary | ICD-10-CM | POA: Diagnosis not present

## 2018-04-14 DIAGNOSIS — N2581 Secondary hyperparathyroidism of renal origin: Secondary | ICD-10-CM | POA: Diagnosis not present

## 2018-04-14 DIAGNOSIS — D631 Anemia in chronic kidney disease: Secondary | ICD-10-CM | POA: Diagnosis not present

## 2018-04-14 DIAGNOSIS — N186 End stage renal disease: Secondary | ICD-10-CM | POA: Diagnosis not present

## 2018-04-14 DIAGNOSIS — D509 Iron deficiency anemia, unspecified: Secondary | ICD-10-CM | POA: Diagnosis not present

## 2018-04-15 DIAGNOSIS — D631 Anemia in chronic kidney disease: Secondary | ICD-10-CM | POA: Diagnosis not present

## 2018-04-15 DIAGNOSIS — N2581 Secondary hyperparathyroidism of renal origin: Secondary | ICD-10-CM | POA: Diagnosis not present

## 2018-04-15 DIAGNOSIS — D509 Iron deficiency anemia, unspecified: Secondary | ICD-10-CM | POA: Diagnosis not present

## 2018-04-15 DIAGNOSIS — N186 End stage renal disease: Secondary | ICD-10-CM | POA: Diagnosis not present

## 2018-04-15 DIAGNOSIS — Z992 Dependence on renal dialysis: Secondary | ICD-10-CM | POA: Diagnosis not present

## 2018-04-16 DIAGNOSIS — D509 Iron deficiency anemia, unspecified: Secondary | ICD-10-CM | POA: Diagnosis not present

## 2018-04-16 DIAGNOSIS — N2581 Secondary hyperparathyroidism of renal origin: Secondary | ICD-10-CM | POA: Diagnosis not present

## 2018-04-16 DIAGNOSIS — Z992 Dependence on renal dialysis: Secondary | ICD-10-CM | POA: Diagnosis not present

## 2018-04-16 DIAGNOSIS — N186 End stage renal disease: Secondary | ICD-10-CM | POA: Diagnosis not present

## 2018-04-16 DIAGNOSIS — D631 Anemia in chronic kidney disease: Secondary | ICD-10-CM | POA: Diagnosis not present

## 2018-04-17 DIAGNOSIS — N186 End stage renal disease: Secondary | ICD-10-CM | POA: Diagnosis not present

## 2018-04-17 DIAGNOSIS — N2581 Secondary hyperparathyroidism of renal origin: Secondary | ICD-10-CM | POA: Diagnosis not present

## 2018-04-17 DIAGNOSIS — D631 Anemia in chronic kidney disease: Secondary | ICD-10-CM | POA: Diagnosis not present

## 2018-04-17 DIAGNOSIS — D509 Iron deficiency anemia, unspecified: Secondary | ICD-10-CM | POA: Diagnosis not present

## 2018-04-17 DIAGNOSIS — Z992 Dependence on renal dialysis: Secondary | ICD-10-CM | POA: Diagnosis not present

## 2018-04-18 DIAGNOSIS — D509 Iron deficiency anemia, unspecified: Secondary | ICD-10-CM | POA: Diagnosis not present

## 2018-04-18 DIAGNOSIS — N2581 Secondary hyperparathyroidism of renal origin: Secondary | ICD-10-CM | POA: Diagnosis not present

## 2018-04-18 DIAGNOSIS — N186 End stage renal disease: Secondary | ICD-10-CM | POA: Diagnosis not present

## 2018-04-18 DIAGNOSIS — Z992 Dependence on renal dialysis: Secondary | ICD-10-CM | POA: Diagnosis not present

## 2018-04-18 DIAGNOSIS — D631 Anemia in chronic kidney disease: Secondary | ICD-10-CM | POA: Diagnosis not present

## 2018-04-19 DIAGNOSIS — D509 Iron deficiency anemia, unspecified: Secondary | ICD-10-CM | POA: Diagnosis not present

## 2018-04-19 DIAGNOSIS — N2581 Secondary hyperparathyroidism of renal origin: Secondary | ICD-10-CM | POA: Diagnosis not present

## 2018-04-19 DIAGNOSIS — Z992 Dependence on renal dialysis: Secondary | ICD-10-CM | POA: Diagnosis not present

## 2018-04-19 DIAGNOSIS — D631 Anemia in chronic kidney disease: Secondary | ICD-10-CM | POA: Diagnosis not present

## 2018-04-19 DIAGNOSIS — N186 End stage renal disease: Secondary | ICD-10-CM | POA: Diagnosis not present

## 2018-04-20 DIAGNOSIS — Z992 Dependence on renal dialysis: Secondary | ICD-10-CM | POA: Diagnosis not present

## 2018-04-20 DIAGNOSIS — N186 End stage renal disease: Secondary | ICD-10-CM | POA: Diagnosis not present

## 2018-04-20 DIAGNOSIS — D631 Anemia in chronic kidney disease: Secondary | ICD-10-CM | POA: Diagnosis not present

## 2018-04-20 DIAGNOSIS — N2581 Secondary hyperparathyroidism of renal origin: Secondary | ICD-10-CM | POA: Diagnosis not present

## 2018-04-20 DIAGNOSIS — D509 Iron deficiency anemia, unspecified: Secondary | ICD-10-CM | POA: Diagnosis not present

## 2018-04-21 DIAGNOSIS — Z992 Dependence on renal dialysis: Secondary | ICD-10-CM | POA: Diagnosis not present

## 2018-04-21 DIAGNOSIS — N2581 Secondary hyperparathyroidism of renal origin: Secondary | ICD-10-CM | POA: Diagnosis not present

## 2018-04-21 DIAGNOSIS — N186 End stage renal disease: Secondary | ICD-10-CM | POA: Diagnosis not present

## 2018-04-21 DIAGNOSIS — D509 Iron deficiency anemia, unspecified: Secondary | ICD-10-CM | POA: Diagnosis not present

## 2018-04-21 DIAGNOSIS — Z23 Encounter for immunization: Secondary | ICD-10-CM | POA: Diagnosis not present

## 2018-04-22 DIAGNOSIS — Z23 Encounter for immunization: Secondary | ICD-10-CM | POA: Diagnosis not present

## 2018-04-22 DIAGNOSIS — D509 Iron deficiency anemia, unspecified: Secondary | ICD-10-CM | POA: Diagnosis not present

## 2018-04-22 DIAGNOSIS — Z992 Dependence on renal dialysis: Secondary | ICD-10-CM | POA: Diagnosis not present

## 2018-04-22 DIAGNOSIS — N186 End stage renal disease: Secondary | ICD-10-CM | POA: Diagnosis not present

## 2018-04-22 DIAGNOSIS — N2581 Secondary hyperparathyroidism of renal origin: Secondary | ICD-10-CM | POA: Diagnosis not present

## 2018-04-23 DIAGNOSIS — Z992 Dependence on renal dialysis: Secondary | ICD-10-CM | POA: Diagnosis not present

## 2018-04-23 DIAGNOSIS — Z23 Encounter for immunization: Secondary | ICD-10-CM | POA: Diagnosis not present

## 2018-04-23 DIAGNOSIS — N2581 Secondary hyperparathyroidism of renal origin: Secondary | ICD-10-CM | POA: Diagnosis not present

## 2018-04-23 DIAGNOSIS — D509 Iron deficiency anemia, unspecified: Secondary | ICD-10-CM | POA: Diagnosis not present

## 2018-04-23 DIAGNOSIS — N186 End stage renal disease: Secondary | ICD-10-CM | POA: Diagnosis not present

## 2018-04-24 DIAGNOSIS — D509 Iron deficiency anemia, unspecified: Secondary | ICD-10-CM | POA: Diagnosis not present

## 2018-04-24 DIAGNOSIS — N186 End stage renal disease: Secondary | ICD-10-CM | POA: Diagnosis not present

## 2018-04-24 DIAGNOSIS — N2581 Secondary hyperparathyroidism of renal origin: Secondary | ICD-10-CM | POA: Diagnosis not present

## 2018-04-24 DIAGNOSIS — Z992 Dependence on renal dialysis: Secondary | ICD-10-CM | POA: Diagnosis not present

## 2018-04-24 DIAGNOSIS — Z23 Encounter for immunization: Secondary | ICD-10-CM | POA: Diagnosis not present

## 2018-04-25 DIAGNOSIS — Z23 Encounter for immunization: Secondary | ICD-10-CM | POA: Diagnosis not present

## 2018-04-25 DIAGNOSIS — D509 Iron deficiency anemia, unspecified: Secondary | ICD-10-CM | POA: Diagnosis not present

## 2018-04-25 DIAGNOSIS — N186 End stage renal disease: Secondary | ICD-10-CM | POA: Diagnosis not present

## 2018-04-25 DIAGNOSIS — Z992 Dependence on renal dialysis: Secondary | ICD-10-CM | POA: Diagnosis not present

## 2018-04-25 DIAGNOSIS — N2581 Secondary hyperparathyroidism of renal origin: Secondary | ICD-10-CM | POA: Diagnosis not present

## 2018-04-26 DIAGNOSIS — D509 Iron deficiency anemia, unspecified: Secondary | ICD-10-CM | POA: Diagnosis not present

## 2018-04-26 DIAGNOSIS — N2581 Secondary hyperparathyroidism of renal origin: Secondary | ICD-10-CM | POA: Diagnosis not present

## 2018-04-26 DIAGNOSIS — Z992 Dependence on renal dialysis: Secondary | ICD-10-CM | POA: Diagnosis not present

## 2018-04-26 DIAGNOSIS — N186 End stage renal disease: Secondary | ICD-10-CM | POA: Diagnosis not present

## 2018-04-26 DIAGNOSIS — Z23 Encounter for immunization: Secondary | ICD-10-CM | POA: Diagnosis not present

## 2018-04-27 DIAGNOSIS — Z992 Dependence on renal dialysis: Secondary | ICD-10-CM | POA: Diagnosis not present

## 2018-04-27 DIAGNOSIS — Z23 Encounter for immunization: Secondary | ICD-10-CM | POA: Diagnosis not present

## 2018-04-27 DIAGNOSIS — N186 End stage renal disease: Secondary | ICD-10-CM | POA: Diagnosis not present

## 2018-04-27 DIAGNOSIS — D509 Iron deficiency anemia, unspecified: Secondary | ICD-10-CM | POA: Diagnosis not present

## 2018-04-27 DIAGNOSIS — N2581 Secondary hyperparathyroidism of renal origin: Secondary | ICD-10-CM | POA: Diagnosis not present

## 2018-04-28 DIAGNOSIS — D509 Iron deficiency anemia, unspecified: Secondary | ICD-10-CM | POA: Diagnosis not present

## 2018-04-28 DIAGNOSIS — N2581 Secondary hyperparathyroidism of renal origin: Secondary | ICD-10-CM | POA: Diagnosis not present

## 2018-04-28 DIAGNOSIS — N186 End stage renal disease: Secondary | ICD-10-CM | POA: Diagnosis not present

## 2018-04-28 DIAGNOSIS — Z992 Dependence on renal dialysis: Secondary | ICD-10-CM | POA: Diagnosis not present

## 2018-04-28 DIAGNOSIS — Z23 Encounter for immunization: Secondary | ICD-10-CM | POA: Diagnosis not present

## 2018-04-29 DIAGNOSIS — N186 End stage renal disease: Secondary | ICD-10-CM | POA: Diagnosis not present

## 2018-04-29 DIAGNOSIS — D509 Iron deficiency anemia, unspecified: Secondary | ICD-10-CM | POA: Diagnosis not present

## 2018-04-29 DIAGNOSIS — Z992 Dependence on renal dialysis: Secondary | ICD-10-CM | POA: Diagnosis not present

## 2018-04-29 DIAGNOSIS — Z23 Encounter for immunization: Secondary | ICD-10-CM | POA: Diagnosis not present

## 2018-04-29 DIAGNOSIS — N2581 Secondary hyperparathyroidism of renal origin: Secondary | ICD-10-CM | POA: Diagnosis not present

## 2018-04-30 DIAGNOSIS — N186 End stage renal disease: Secondary | ICD-10-CM | POA: Diagnosis not present

## 2018-04-30 DIAGNOSIS — N2581 Secondary hyperparathyroidism of renal origin: Secondary | ICD-10-CM | POA: Diagnosis not present

## 2018-04-30 DIAGNOSIS — D509 Iron deficiency anemia, unspecified: Secondary | ICD-10-CM | POA: Diagnosis not present

## 2018-04-30 DIAGNOSIS — Z23 Encounter for immunization: Secondary | ICD-10-CM | POA: Diagnosis not present

## 2018-04-30 DIAGNOSIS — Z992 Dependence on renal dialysis: Secondary | ICD-10-CM | POA: Diagnosis not present

## 2018-05-01 DIAGNOSIS — Z23 Encounter for immunization: Secondary | ICD-10-CM | POA: Diagnosis not present

## 2018-05-01 DIAGNOSIS — Z992 Dependence on renal dialysis: Secondary | ICD-10-CM | POA: Diagnosis not present

## 2018-05-01 DIAGNOSIS — N186 End stage renal disease: Secondary | ICD-10-CM | POA: Diagnosis not present

## 2018-05-01 DIAGNOSIS — H43393 Other vitreous opacities, bilateral: Secondary | ICD-10-CM | POA: Diagnosis not present

## 2018-05-01 DIAGNOSIS — N2581 Secondary hyperparathyroidism of renal origin: Secondary | ICD-10-CM | POA: Diagnosis not present

## 2018-05-01 DIAGNOSIS — D509 Iron deficiency anemia, unspecified: Secondary | ICD-10-CM | POA: Diagnosis not present

## 2018-05-02 DIAGNOSIS — N2581 Secondary hyperparathyroidism of renal origin: Secondary | ICD-10-CM | POA: Diagnosis not present

## 2018-05-02 DIAGNOSIS — Z992 Dependence on renal dialysis: Secondary | ICD-10-CM | POA: Diagnosis not present

## 2018-05-02 DIAGNOSIS — D509 Iron deficiency anemia, unspecified: Secondary | ICD-10-CM | POA: Diagnosis not present

## 2018-05-02 DIAGNOSIS — N186 End stage renal disease: Secondary | ICD-10-CM | POA: Diagnosis not present

## 2018-05-02 DIAGNOSIS — Z23 Encounter for immunization: Secondary | ICD-10-CM | POA: Diagnosis not present

## 2018-05-03 DIAGNOSIS — Z992 Dependence on renal dialysis: Secondary | ICD-10-CM | POA: Diagnosis not present

## 2018-05-03 DIAGNOSIS — Z23 Encounter for immunization: Secondary | ICD-10-CM | POA: Diagnosis not present

## 2018-05-03 DIAGNOSIS — N186 End stage renal disease: Secondary | ICD-10-CM | POA: Diagnosis not present

## 2018-05-03 DIAGNOSIS — D509 Iron deficiency anemia, unspecified: Secondary | ICD-10-CM | POA: Diagnosis not present

## 2018-05-03 DIAGNOSIS — N2581 Secondary hyperparathyroidism of renal origin: Secondary | ICD-10-CM | POA: Diagnosis not present

## 2018-05-04 DIAGNOSIS — Z23 Encounter for immunization: Secondary | ICD-10-CM | POA: Diagnosis not present

## 2018-05-04 DIAGNOSIS — Z992 Dependence on renal dialysis: Secondary | ICD-10-CM | POA: Diagnosis not present

## 2018-05-04 DIAGNOSIS — N186 End stage renal disease: Secondary | ICD-10-CM | POA: Diagnosis not present

## 2018-05-04 DIAGNOSIS — D509 Iron deficiency anemia, unspecified: Secondary | ICD-10-CM | POA: Diagnosis not present

## 2018-05-04 DIAGNOSIS — N2581 Secondary hyperparathyroidism of renal origin: Secondary | ICD-10-CM | POA: Diagnosis not present

## 2018-05-05 DIAGNOSIS — N2581 Secondary hyperparathyroidism of renal origin: Secondary | ICD-10-CM | POA: Diagnosis not present

## 2018-05-05 DIAGNOSIS — Z23 Encounter for immunization: Secondary | ICD-10-CM | POA: Diagnosis not present

## 2018-05-05 DIAGNOSIS — N186 End stage renal disease: Secondary | ICD-10-CM | POA: Diagnosis not present

## 2018-05-05 DIAGNOSIS — Z992 Dependence on renal dialysis: Secondary | ICD-10-CM | POA: Diagnosis not present

## 2018-05-05 DIAGNOSIS — D509 Iron deficiency anemia, unspecified: Secondary | ICD-10-CM | POA: Diagnosis not present

## 2018-05-06 DIAGNOSIS — N2581 Secondary hyperparathyroidism of renal origin: Secondary | ICD-10-CM | POA: Diagnosis not present

## 2018-05-06 DIAGNOSIS — Z23 Encounter for immunization: Secondary | ICD-10-CM | POA: Diagnosis not present

## 2018-05-06 DIAGNOSIS — D509 Iron deficiency anemia, unspecified: Secondary | ICD-10-CM | POA: Diagnosis not present

## 2018-05-06 DIAGNOSIS — Z992 Dependence on renal dialysis: Secondary | ICD-10-CM | POA: Diagnosis not present

## 2018-05-06 DIAGNOSIS — N186 End stage renal disease: Secondary | ICD-10-CM | POA: Diagnosis not present

## 2018-05-07 DIAGNOSIS — D509 Iron deficiency anemia, unspecified: Secondary | ICD-10-CM | POA: Diagnosis not present

## 2018-05-07 DIAGNOSIS — N186 End stage renal disease: Secondary | ICD-10-CM | POA: Diagnosis not present

## 2018-05-07 DIAGNOSIS — N2581 Secondary hyperparathyroidism of renal origin: Secondary | ICD-10-CM | POA: Diagnosis not present

## 2018-05-07 DIAGNOSIS — Z992 Dependence on renal dialysis: Secondary | ICD-10-CM | POA: Diagnosis not present

## 2018-05-07 DIAGNOSIS — Z23 Encounter for immunization: Secondary | ICD-10-CM | POA: Diagnosis not present

## 2018-05-08 DIAGNOSIS — D509 Iron deficiency anemia, unspecified: Secondary | ICD-10-CM | POA: Diagnosis not present

## 2018-05-08 DIAGNOSIS — Z23 Encounter for immunization: Secondary | ICD-10-CM | POA: Diagnosis not present

## 2018-05-08 DIAGNOSIS — N2581 Secondary hyperparathyroidism of renal origin: Secondary | ICD-10-CM | POA: Diagnosis not present

## 2018-05-08 DIAGNOSIS — Z992 Dependence on renal dialysis: Secondary | ICD-10-CM | POA: Diagnosis not present

## 2018-05-08 DIAGNOSIS — N186 End stage renal disease: Secondary | ICD-10-CM | POA: Diagnosis not present

## 2018-05-09 DIAGNOSIS — N2581 Secondary hyperparathyroidism of renal origin: Secondary | ICD-10-CM | POA: Diagnosis not present

## 2018-05-09 DIAGNOSIS — Z992 Dependence on renal dialysis: Secondary | ICD-10-CM | POA: Diagnosis not present

## 2018-05-09 DIAGNOSIS — Z23 Encounter for immunization: Secondary | ICD-10-CM | POA: Diagnosis not present

## 2018-05-09 DIAGNOSIS — D509 Iron deficiency anemia, unspecified: Secondary | ICD-10-CM | POA: Diagnosis not present

## 2018-05-09 DIAGNOSIS — N186 End stage renal disease: Secondary | ICD-10-CM | POA: Diagnosis not present

## 2018-05-10 DIAGNOSIS — Z992 Dependence on renal dialysis: Secondary | ICD-10-CM | POA: Diagnosis not present

## 2018-05-10 DIAGNOSIS — N186 End stage renal disease: Secondary | ICD-10-CM | POA: Diagnosis not present

## 2018-05-10 DIAGNOSIS — N2581 Secondary hyperparathyroidism of renal origin: Secondary | ICD-10-CM | POA: Diagnosis not present

## 2018-05-10 DIAGNOSIS — Z23 Encounter for immunization: Secondary | ICD-10-CM | POA: Diagnosis not present

## 2018-05-10 DIAGNOSIS — D509 Iron deficiency anemia, unspecified: Secondary | ICD-10-CM | POA: Diagnosis not present

## 2018-05-11 DIAGNOSIS — Z23 Encounter for immunization: Secondary | ICD-10-CM | POA: Diagnosis not present

## 2018-05-11 DIAGNOSIS — D509 Iron deficiency anemia, unspecified: Secondary | ICD-10-CM | POA: Diagnosis not present

## 2018-05-11 DIAGNOSIS — N2581 Secondary hyperparathyroidism of renal origin: Secondary | ICD-10-CM | POA: Diagnosis not present

## 2018-05-11 DIAGNOSIS — N186 End stage renal disease: Secondary | ICD-10-CM | POA: Diagnosis not present

## 2018-05-11 DIAGNOSIS — Z992 Dependence on renal dialysis: Secondary | ICD-10-CM | POA: Diagnosis not present

## 2018-05-12 DIAGNOSIS — Z992 Dependence on renal dialysis: Secondary | ICD-10-CM | POA: Diagnosis not present

## 2018-05-12 DIAGNOSIS — N2581 Secondary hyperparathyroidism of renal origin: Secondary | ICD-10-CM | POA: Diagnosis not present

## 2018-05-12 DIAGNOSIS — N186 End stage renal disease: Secondary | ICD-10-CM | POA: Diagnosis not present

## 2018-05-12 DIAGNOSIS — D509 Iron deficiency anemia, unspecified: Secondary | ICD-10-CM | POA: Diagnosis not present

## 2018-05-12 DIAGNOSIS — Z23 Encounter for immunization: Secondary | ICD-10-CM | POA: Diagnosis not present

## 2018-05-13 DIAGNOSIS — N2581 Secondary hyperparathyroidism of renal origin: Secondary | ICD-10-CM | POA: Diagnosis not present

## 2018-05-13 DIAGNOSIS — N186 End stage renal disease: Secondary | ICD-10-CM | POA: Diagnosis not present

## 2018-05-13 DIAGNOSIS — D509 Iron deficiency anemia, unspecified: Secondary | ICD-10-CM | POA: Diagnosis not present

## 2018-05-13 DIAGNOSIS — Z992 Dependence on renal dialysis: Secondary | ICD-10-CM | POA: Diagnosis not present

## 2018-05-13 DIAGNOSIS — Z23 Encounter for immunization: Secondary | ICD-10-CM | POA: Diagnosis not present

## 2018-05-14 DIAGNOSIS — N186 End stage renal disease: Secondary | ICD-10-CM | POA: Diagnosis not present

## 2018-05-14 DIAGNOSIS — Z23 Encounter for immunization: Secondary | ICD-10-CM | POA: Diagnosis not present

## 2018-05-14 DIAGNOSIS — Z992 Dependence on renal dialysis: Secondary | ICD-10-CM | POA: Diagnosis not present

## 2018-05-14 DIAGNOSIS — D509 Iron deficiency anemia, unspecified: Secondary | ICD-10-CM | POA: Diagnosis not present

## 2018-05-14 DIAGNOSIS — N2581 Secondary hyperparathyroidism of renal origin: Secondary | ICD-10-CM | POA: Diagnosis not present

## 2018-05-15 DIAGNOSIS — N186 End stage renal disease: Secondary | ICD-10-CM | POA: Diagnosis not present

## 2018-05-15 DIAGNOSIS — Z992 Dependence on renal dialysis: Secondary | ICD-10-CM | POA: Diagnosis not present

## 2018-05-15 DIAGNOSIS — D509 Iron deficiency anemia, unspecified: Secondary | ICD-10-CM | POA: Diagnosis not present

## 2018-05-15 DIAGNOSIS — N2581 Secondary hyperparathyroidism of renal origin: Secondary | ICD-10-CM | POA: Diagnosis not present

## 2018-05-15 DIAGNOSIS — Z23 Encounter for immunization: Secondary | ICD-10-CM | POA: Diagnosis not present

## 2018-05-16 DIAGNOSIS — D509 Iron deficiency anemia, unspecified: Secondary | ICD-10-CM | POA: Diagnosis not present

## 2018-05-16 DIAGNOSIS — N186 End stage renal disease: Secondary | ICD-10-CM | POA: Diagnosis not present

## 2018-05-16 DIAGNOSIS — Z23 Encounter for immunization: Secondary | ICD-10-CM | POA: Diagnosis not present

## 2018-05-16 DIAGNOSIS — N2581 Secondary hyperparathyroidism of renal origin: Secondary | ICD-10-CM | POA: Diagnosis not present

## 2018-05-16 DIAGNOSIS — Z992 Dependence on renal dialysis: Secondary | ICD-10-CM | POA: Diagnosis not present

## 2018-05-17 DIAGNOSIS — N2581 Secondary hyperparathyroidism of renal origin: Secondary | ICD-10-CM | POA: Diagnosis not present

## 2018-05-17 DIAGNOSIS — N186 End stage renal disease: Secondary | ICD-10-CM | POA: Diagnosis not present

## 2018-05-17 DIAGNOSIS — D509 Iron deficiency anemia, unspecified: Secondary | ICD-10-CM | POA: Diagnosis not present

## 2018-05-17 DIAGNOSIS — Z23 Encounter for immunization: Secondary | ICD-10-CM | POA: Diagnosis not present

## 2018-05-17 DIAGNOSIS — Z992 Dependence on renal dialysis: Secondary | ICD-10-CM | POA: Diagnosis not present

## 2018-05-18 DIAGNOSIS — N186 End stage renal disease: Secondary | ICD-10-CM | POA: Diagnosis not present

## 2018-05-18 DIAGNOSIS — N2581 Secondary hyperparathyroidism of renal origin: Secondary | ICD-10-CM | POA: Diagnosis not present

## 2018-05-18 DIAGNOSIS — Z23 Encounter for immunization: Secondary | ICD-10-CM | POA: Diagnosis not present

## 2018-05-18 DIAGNOSIS — D509 Iron deficiency anemia, unspecified: Secondary | ICD-10-CM | POA: Diagnosis not present

## 2018-05-18 DIAGNOSIS — Z992 Dependence on renal dialysis: Secondary | ICD-10-CM | POA: Diagnosis not present

## 2018-05-19 DIAGNOSIS — D509 Iron deficiency anemia, unspecified: Secondary | ICD-10-CM | POA: Diagnosis not present

## 2018-05-19 DIAGNOSIS — Z23 Encounter for immunization: Secondary | ICD-10-CM | POA: Diagnosis not present

## 2018-05-19 DIAGNOSIS — N186 End stage renal disease: Secondary | ICD-10-CM | POA: Diagnosis not present

## 2018-05-19 DIAGNOSIS — Z992 Dependence on renal dialysis: Secondary | ICD-10-CM | POA: Diagnosis not present

## 2018-05-19 DIAGNOSIS — N2581 Secondary hyperparathyroidism of renal origin: Secondary | ICD-10-CM | POA: Diagnosis not present

## 2018-05-20 DIAGNOSIS — Z23 Encounter for immunization: Secondary | ICD-10-CM | POA: Diagnosis not present

## 2018-05-20 DIAGNOSIS — N2581 Secondary hyperparathyroidism of renal origin: Secondary | ICD-10-CM | POA: Diagnosis not present

## 2018-05-20 DIAGNOSIS — Z992 Dependence on renal dialysis: Secondary | ICD-10-CM | POA: Diagnosis not present

## 2018-05-20 DIAGNOSIS — D509 Iron deficiency anemia, unspecified: Secondary | ICD-10-CM | POA: Diagnosis not present

## 2018-05-20 DIAGNOSIS — N186 End stage renal disease: Secondary | ICD-10-CM | POA: Diagnosis not present

## 2018-05-21 DIAGNOSIS — D509 Iron deficiency anemia, unspecified: Secondary | ICD-10-CM | POA: Diagnosis not present

## 2018-05-21 DIAGNOSIS — N2581 Secondary hyperparathyroidism of renal origin: Secondary | ICD-10-CM | POA: Diagnosis not present

## 2018-05-21 DIAGNOSIS — N186 End stage renal disease: Secondary | ICD-10-CM | POA: Diagnosis not present

## 2018-05-21 DIAGNOSIS — Z992 Dependence on renal dialysis: Secondary | ICD-10-CM | POA: Diagnosis not present

## 2018-05-21 DIAGNOSIS — Z23 Encounter for immunization: Secondary | ICD-10-CM | POA: Diagnosis not present

## 2018-05-22 DIAGNOSIS — N2581 Secondary hyperparathyroidism of renal origin: Secondary | ICD-10-CM | POA: Diagnosis not present

## 2018-05-22 DIAGNOSIS — Z992 Dependence on renal dialysis: Secondary | ICD-10-CM | POA: Diagnosis not present

## 2018-05-22 DIAGNOSIS — N186 End stage renal disease: Secondary | ICD-10-CM | POA: Diagnosis not present

## 2018-05-22 DIAGNOSIS — D509 Iron deficiency anemia, unspecified: Secondary | ICD-10-CM | POA: Diagnosis not present

## 2018-05-23 DIAGNOSIS — D509 Iron deficiency anemia, unspecified: Secondary | ICD-10-CM | POA: Diagnosis not present

## 2018-05-23 DIAGNOSIS — Z992 Dependence on renal dialysis: Secondary | ICD-10-CM | POA: Diagnosis not present

## 2018-05-23 DIAGNOSIS — N2581 Secondary hyperparathyroidism of renal origin: Secondary | ICD-10-CM | POA: Diagnosis not present

## 2018-05-23 DIAGNOSIS — N186 End stage renal disease: Secondary | ICD-10-CM | POA: Diagnosis not present

## 2018-05-24 DIAGNOSIS — N2581 Secondary hyperparathyroidism of renal origin: Secondary | ICD-10-CM | POA: Diagnosis not present

## 2018-05-24 DIAGNOSIS — D509 Iron deficiency anemia, unspecified: Secondary | ICD-10-CM | POA: Diagnosis not present

## 2018-05-24 DIAGNOSIS — N186 End stage renal disease: Secondary | ICD-10-CM | POA: Diagnosis not present

## 2018-05-24 DIAGNOSIS — Z992 Dependence on renal dialysis: Secondary | ICD-10-CM | POA: Diagnosis not present

## 2018-05-25 DIAGNOSIS — Z992 Dependence on renal dialysis: Secondary | ICD-10-CM | POA: Diagnosis not present

## 2018-05-25 DIAGNOSIS — N186 End stage renal disease: Secondary | ICD-10-CM | POA: Diagnosis not present

## 2018-05-25 DIAGNOSIS — N2581 Secondary hyperparathyroidism of renal origin: Secondary | ICD-10-CM | POA: Diagnosis not present

## 2018-05-25 DIAGNOSIS — D509 Iron deficiency anemia, unspecified: Secondary | ICD-10-CM | POA: Diagnosis not present

## 2018-05-26 DIAGNOSIS — N186 End stage renal disease: Secondary | ICD-10-CM | POA: Diagnosis not present

## 2018-05-26 DIAGNOSIS — Z992 Dependence on renal dialysis: Secondary | ICD-10-CM | POA: Diagnosis not present

## 2018-05-26 DIAGNOSIS — N2581 Secondary hyperparathyroidism of renal origin: Secondary | ICD-10-CM | POA: Diagnosis not present

## 2018-05-26 DIAGNOSIS — D509 Iron deficiency anemia, unspecified: Secondary | ICD-10-CM | POA: Diagnosis not present

## 2018-05-27 DIAGNOSIS — Z992 Dependence on renal dialysis: Secondary | ICD-10-CM | POA: Diagnosis not present

## 2018-05-27 DIAGNOSIS — N2581 Secondary hyperparathyroidism of renal origin: Secondary | ICD-10-CM | POA: Diagnosis not present

## 2018-05-27 DIAGNOSIS — D509 Iron deficiency anemia, unspecified: Secondary | ICD-10-CM | POA: Diagnosis not present

## 2018-05-27 DIAGNOSIS — N186 End stage renal disease: Secondary | ICD-10-CM | POA: Diagnosis not present

## 2018-05-28 DIAGNOSIS — Z992 Dependence on renal dialysis: Secondary | ICD-10-CM | POA: Diagnosis not present

## 2018-05-28 DIAGNOSIS — N186 End stage renal disease: Secondary | ICD-10-CM | POA: Diagnosis not present

## 2018-05-28 DIAGNOSIS — N2581 Secondary hyperparathyroidism of renal origin: Secondary | ICD-10-CM | POA: Diagnosis not present

## 2018-05-28 DIAGNOSIS — D509 Iron deficiency anemia, unspecified: Secondary | ICD-10-CM | POA: Diagnosis not present

## 2018-05-29 DIAGNOSIS — Z992 Dependence on renal dialysis: Secondary | ICD-10-CM | POA: Diagnosis not present

## 2018-05-29 DIAGNOSIS — N2581 Secondary hyperparathyroidism of renal origin: Secondary | ICD-10-CM | POA: Diagnosis not present

## 2018-05-29 DIAGNOSIS — D509 Iron deficiency anemia, unspecified: Secondary | ICD-10-CM | POA: Diagnosis not present

## 2018-05-29 DIAGNOSIS — N186 End stage renal disease: Secondary | ICD-10-CM | POA: Diagnosis not present

## 2018-05-30 DIAGNOSIS — D509 Iron deficiency anemia, unspecified: Secondary | ICD-10-CM | POA: Diagnosis not present

## 2018-05-30 DIAGNOSIS — Z992 Dependence on renal dialysis: Secondary | ICD-10-CM | POA: Diagnosis not present

## 2018-05-30 DIAGNOSIS — N186 End stage renal disease: Secondary | ICD-10-CM | POA: Diagnosis not present

## 2018-05-30 DIAGNOSIS — N2581 Secondary hyperparathyroidism of renal origin: Secondary | ICD-10-CM | POA: Diagnosis not present

## 2018-05-31 DIAGNOSIS — N186 End stage renal disease: Secondary | ICD-10-CM | POA: Diagnosis not present

## 2018-05-31 DIAGNOSIS — D509 Iron deficiency anemia, unspecified: Secondary | ICD-10-CM | POA: Diagnosis not present

## 2018-05-31 DIAGNOSIS — N2581 Secondary hyperparathyroidism of renal origin: Secondary | ICD-10-CM | POA: Diagnosis not present

## 2018-05-31 DIAGNOSIS — Z992 Dependence on renal dialysis: Secondary | ICD-10-CM | POA: Diagnosis not present

## 2018-06-01 DIAGNOSIS — N186 End stage renal disease: Secondary | ICD-10-CM | POA: Diagnosis not present

## 2018-06-01 DIAGNOSIS — D509 Iron deficiency anemia, unspecified: Secondary | ICD-10-CM | POA: Diagnosis not present

## 2018-06-01 DIAGNOSIS — N2581 Secondary hyperparathyroidism of renal origin: Secondary | ICD-10-CM | POA: Diagnosis not present

## 2018-06-01 DIAGNOSIS — Z992 Dependence on renal dialysis: Secondary | ICD-10-CM | POA: Diagnosis not present

## 2018-06-02 DIAGNOSIS — Z992 Dependence on renal dialysis: Secondary | ICD-10-CM | POA: Diagnosis not present

## 2018-06-02 DIAGNOSIS — D509 Iron deficiency anemia, unspecified: Secondary | ICD-10-CM | POA: Diagnosis not present

## 2018-06-02 DIAGNOSIS — N2581 Secondary hyperparathyroidism of renal origin: Secondary | ICD-10-CM | POA: Diagnosis not present

## 2018-06-02 DIAGNOSIS — N186 End stage renal disease: Secondary | ICD-10-CM | POA: Diagnosis not present

## 2018-06-03 DIAGNOSIS — D509 Iron deficiency anemia, unspecified: Secondary | ICD-10-CM | POA: Diagnosis not present

## 2018-06-03 DIAGNOSIS — Z992 Dependence on renal dialysis: Secondary | ICD-10-CM | POA: Diagnosis not present

## 2018-06-03 DIAGNOSIS — N2581 Secondary hyperparathyroidism of renal origin: Secondary | ICD-10-CM | POA: Diagnosis not present

## 2018-06-03 DIAGNOSIS — N186 End stage renal disease: Secondary | ICD-10-CM | POA: Diagnosis not present

## 2018-06-04 DIAGNOSIS — Z992 Dependence on renal dialysis: Secondary | ICD-10-CM | POA: Diagnosis not present

## 2018-06-04 DIAGNOSIS — D509 Iron deficiency anemia, unspecified: Secondary | ICD-10-CM | POA: Diagnosis not present

## 2018-06-04 DIAGNOSIS — N186 End stage renal disease: Secondary | ICD-10-CM | POA: Diagnosis not present

## 2018-06-04 DIAGNOSIS — N2581 Secondary hyperparathyroidism of renal origin: Secondary | ICD-10-CM | POA: Diagnosis not present

## 2018-06-05 DIAGNOSIS — Z992 Dependence on renal dialysis: Secondary | ICD-10-CM | POA: Diagnosis not present

## 2018-06-05 DIAGNOSIS — N2581 Secondary hyperparathyroidism of renal origin: Secondary | ICD-10-CM | POA: Diagnosis not present

## 2018-06-05 DIAGNOSIS — D509 Iron deficiency anemia, unspecified: Secondary | ICD-10-CM | POA: Diagnosis not present

## 2018-06-05 DIAGNOSIS — N186 End stage renal disease: Secondary | ICD-10-CM | POA: Diagnosis not present

## 2018-06-06 DIAGNOSIS — D509 Iron deficiency anemia, unspecified: Secondary | ICD-10-CM | POA: Diagnosis not present

## 2018-06-06 DIAGNOSIS — Z992 Dependence on renal dialysis: Secondary | ICD-10-CM | POA: Diagnosis not present

## 2018-06-06 DIAGNOSIS — N186 End stage renal disease: Secondary | ICD-10-CM | POA: Diagnosis not present

## 2018-06-06 DIAGNOSIS — N2581 Secondary hyperparathyroidism of renal origin: Secondary | ICD-10-CM | POA: Diagnosis not present

## 2018-06-07 DIAGNOSIS — D509 Iron deficiency anemia, unspecified: Secondary | ICD-10-CM | POA: Diagnosis not present

## 2018-06-07 DIAGNOSIS — N2581 Secondary hyperparathyroidism of renal origin: Secondary | ICD-10-CM | POA: Diagnosis not present

## 2018-06-07 DIAGNOSIS — N186 End stage renal disease: Secondary | ICD-10-CM | POA: Diagnosis not present

## 2018-06-07 DIAGNOSIS — Z992 Dependence on renal dialysis: Secondary | ICD-10-CM | POA: Diagnosis not present

## 2018-06-08 DIAGNOSIS — N2581 Secondary hyperparathyroidism of renal origin: Secondary | ICD-10-CM | POA: Diagnosis not present

## 2018-06-08 DIAGNOSIS — N186 End stage renal disease: Secondary | ICD-10-CM | POA: Diagnosis not present

## 2018-06-08 DIAGNOSIS — Z992 Dependence on renal dialysis: Secondary | ICD-10-CM | POA: Diagnosis not present

## 2018-06-08 DIAGNOSIS — D509 Iron deficiency anemia, unspecified: Secondary | ICD-10-CM | POA: Diagnosis not present

## 2018-06-09 DIAGNOSIS — Z992 Dependence on renal dialysis: Secondary | ICD-10-CM | POA: Diagnosis not present

## 2018-06-09 DIAGNOSIS — D509 Iron deficiency anemia, unspecified: Secondary | ICD-10-CM | POA: Diagnosis not present

## 2018-06-09 DIAGNOSIS — N2581 Secondary hyperparathyroidism of renal origin: Secondary | ICD-10-CM | POA: Diagnosis not present

## 2018-06-09 DIAGNOSIS — N186 End stage renal disease: Secondary | ICD-10-CM | POA: Diagnosis not present

## 2018-06-10 DIAGNOSIS — N2581 Secondary hyperparathyroidism of renal origin: Secondary | ICD-10-CM | POA: Diagnosis not present

## 2018-06-10 DIAGNOSIS — D509 Iron deficiency anemia, unspecified: Secondary | ICD-10-CM | POA: Diagnosis not present

## 2018-06-10 DIAGNOSIS — N186 End stage renal disease: Secondary | ICD-10-CM | POA: Diagnosis not present

## 2018-06-10 DIAGNOSIS — Z992 Dependence on renal dialysis: Secondary | ICD-10-CM | POA: Diagnosis not present

## 2018-06-11 DIAGNOSIS — N186 End stage renal disease: Secondary | ICD-10-CM | POA: Diagnosis not present

## 2018-06-11 DIAGNOSIS — N2581 Secondary hyperparathyroidism of renal origin: Secondary | ICD-10-CM | POA: Diagnosis not present

## 2018-06-11 DIAGNOSIS — D509 Iron deficiency anemia, unspecified: Secondary | ICD-10-CM | POA: Diagnosis not present

## 2018-06-11 DIAGNOSIS — Z992 Dependence on renal dialysis: Secondary | ICD-10-CM | POA: Diagnosis not present

## 2018-06-12 DIAGNOSIS — Z992 Dependence on renal dialysis: Secondary | ICD-10-CM | POA: Diagnosis not present

## 2018-06-12 DIAGNOSIS — N2581 Secondary hyperparathyroidism of renal origin: Secondary | ICD-10-CM | POA: Diagnosis not present

## 2018-06-12 DIAGNOSIS — D509 Iron deficiency anemia, unspecified: Secondary | ICD-10-CM | POA: Diagnosis not present

## 2018-06-12 DIAGNOSIS — N186 End stage renal disease: Secondary | ICD-10-CM | POA: Diagnosis not present

## 2018-06-13 DIAGNOSIS — N186 End stage renal disease: Secondary | ICD-10-CM | POA: Diagnosis not present

## 2018-06-13 DIAGNOSIS — N2581 Secondary hyperparathyroidism of renal origin: Secondary | ICD-10-CM | POA: Diagnosis not present

## 2018-06-13 DIAGNOSIS — Z992 Dependence on renal dialysis: Secondary | ICD-10-CM | POA: Diagnosis not present

## 2018-06-13 DIAGNOSIS — D509 Iron deficiency anemia, unspecified: Secondary | ICD-10-CM | POA: Diagnosis not present

## 2018-06-14 DIAGNOSIS — D509 Iron deficiency anemia, unspecified: Secondary | ICD-10-CM | POA: Diagnosis not present

## 2018-06-14 DIAGNOSIS — N186 End stage renal disease: Secondary | ICD-10-CM | POA: Diagnosis not present

## 2018-06-14 DIAGNOSIS — Z992 Dependence on renal dialysis: Secondary | ICD-10-CM | POA: Diagnosis not present

## 2018-06-14 DIAGNOSIS — N2581 Secondary hyperparathyroidism of renal origin: Secondary | ICD-10-CM | POA: Diagnosis not present

## 2018-06-15 DIAGNOSIS — Z992 Dependence on renal dialysis: Secondary | ICD-10-CM | POA: Diagnosis not present

## 2018-06-15 DIAGNOSIS — D509 Iron deficiency anemia, unspecified: Secondary | ICD-10-CM | POA: Diagnosis not present

## 2018-06-15 DIAGNOSIS — N186 End stage renal disease: Secondary | ICD-10-CM | POA: Diagnosis not present

## 2018-06-15 DIAGNOSIS — N2581 Secondary hyperparathyroidism of renal origin: Secondary | ICD-10-CM | POA: Diagnosis not present

## 2018-06-16 DIAGNOSIS — Z992 Dependence on renal dialysis: Secondary | ICD-10-CM | POA: Diagnosis not present

## 2018-06-16 DIAGNOSIS — N185 Chronic kidney disease, stage 5: Secondary | ICD-10-CM | POA: Diagnosis not present

## 2018-06-16 DIAGNOSIS — N401 Enlarged prostate with lower urinary tract symptoms: Secondary | ICD-10-CM | POA: Diagnosis not present

## 2018-06-16 DIAGNOSIS — D509 Iron deficiency anemia, unspecified: Secondary | ICD-10-CM | POA: Diagnosis not present

## 2018-06-16 DIAGNOSIS — D508 Other iron deficiency anemias: Secondary | ICD-10-CM | POA: Diagnosis not present

## 2018-06-16 DIAGNOSIS — I10 Essential (primary) hypertension: Secondary | ICD-10-CM | POA: Diagnosis not present

## 2018-06-16 DIAGNOSIS — K210 Gastro-esophageal reflux disease with esophagitis: Secondary | ICD-10-CM | POA: Diagnosis not present

## 2018-06-16 DIAGNOSIS — N2581 Secondary hyperparathyroidism of renal origin: Secondary | ICD-10-CM | POA: Diagnosis not present

## 2018-06-16 DIAGNOSIS — N186 End stage renal disease: Secondary | ICD-10-CM | POA: Diagnosis not present

## 2018-06-17 DIAGNOSIS — Z992 Dependence on renal dialysis: Secondary | ICD-10-CM | POA: Diagnosis not present

## 2018-06-17 DIAGNOSIS — N186 End stage renal disease: Secondary | ICD-10-CM | POA: Diagnosis not present

## 2018-06-17 DIAGNOSIS — N2581 Secondary hyperparathyroidism of renal origin: Secondary | ICD-10-CM | POA: Diagnosis not present

## 2018-06-17 DIAGNOSIS — D509 Iron deficiency anemia, unspecified: Secondary | ICD-10-CM | POA: Diagnosis not present

## 2018-06-18 DIAGNOSIS — N186 End stage renal disease: Secondary | ICD-10-CM | POA: Diagnosis not present

## 2018-06-18 DIAGNOSIS — D509 Iron deficiency anemia, unspecified: Secondary | ICD-10-CM | POA: Diagnosis not present

## 2018-06-18 DIAGNOSIS — N2581 Secondary hyperparathyroidism of renal origin: Secondary | ICD-10-CM | POA: Diagnosis not present

## 2018-06-18 DIAGNOSIS — Z992 Dependence on renal dialysis: Secondary | ICD-10-CM | POA: Diagnosis not present

## 2018-06-19 DIAGNOSIS — N2581 Secondary hyperparathyroidism of renal origin: Secondary | ICD-10-CM | POA: Diagnosis not present

## 2018-06-19 DIAGNOSIS — N186 End stage renal disease: Secondary | ICD-10-CM | POA: Diagnosis not present

## 2018-06-19 DIAGNOSIS — D509 Iron deficiency anemia, unspecified: Secondary | ICD-10-CM | POA: Diagnosis not present

## 2018-06-19 DIAGNOSIS — Z992 Dependence on renal dialysis: Secondary | ICD-10-CM | POA: Diagnosis not present

## 2018-06-20 DIAGNOSIS — D509 Iron deficiency anemia, unspecified: Secondary | ICD-10-CM | POA: Diagnosis not present

## 2018-06-20 DIAGNOSIS — N2581 Secondary hyperparathyroidism of renal origin: Secondary | ICD-10-CM | POA: Diagnosis not present

## 2018-06-20 DIAGNOSIS — N186 End stage renal disease: Secondary | ICD-10-CM | POA: Diagnosis not present

## 2018-06-20 DIAGNOSIS — Z992 Dependence on renal dialysis: Secondary | ICD-10-CM | POA: Diagnosis not present

## 2018-06-21 DIAGNOSIS — D509 Iron deficiency anemia, unspecified: Secondary | ICD-10-CM | POA: Diagnosis not present

## 2018-06-21 DIAGNOSIS — Z992 Dependence on renal dialysis: Secondary | ICD-10-CM | POA: Diagnosis not present

## 2018-06-21 DIAGNOSIS — N2581 Secondary hyperparathyroidism of renal origin: Secondary | ICD-10-CM | POA: Diagnosis not present

## 2018-06-21 DIAGNOSIS — N186 End stage renal disease: Secondary | ICD-10-CM | POA: Diagnosis not present

## 2018-06-22 DIAGNOSIS — N186 End stage renal disease: Secondary | ICD-10-CM | POA: Diagnosis not present

## 2018-06-22 DIAGNOSIS — N2581 Secondary hyperparathyroidism of renal origin: Secondary | ICD-10-CM | POA: Diagnosis not present

## 2018-06-22 DIAGNOSIS — D509 Iron deficiency anemia, unspecified: Secondary | ICD-10-CM | POA: Diagnosis not present

## 2018-06-22 DIAGNOSIS — Z992 Dependence on renal dialysis: Secondary | ICD-10-CM | POA: Diagnosis not present

## 2018-06-23 DIAGNOSIS — N186 End stage renal disease: Secondary | ICD-10-CM | POA: Diagnosis not present

## 2018-06-23 DIAGNOSIS — N2581 Secondary hyperparathyroidism of renal origin: Secondary | ICD-10-CM | POA: Diagnosis not present

## 2018-06-23 DIAGNOSIS — Z992 Dependence on renal dialysis: Secondary | ICD-10-CM | POA: Diagnosis not present

## 2018-06-23 DIAGNOSIS — D509 Iron deficiency anemia, unspecified: Secondary | ICD-10-CM | POA: Diagnosis not present

## 2018-06-24 DIAGNOSIS — N186 End stage renal disease: Secondary | ICD-10-CM | POA: Diagnosis not present

## 2018-06-24 DIAGNOSIS — N2581 Secondary hyperparathyroidism of renal origin: Secondary | ICD-10-CM | POA: Diagnosis not present

## 2018-06-24 DIAGNOSIS — Z992 Dependence on renal dialysis: Secondary | ICD-10-CM | POA: Diagnosis not present

## 2018-06-24 DIAGNOSIS — D509 Iron deficiency anemia, unspecified: Secondary | ICD-10-CM | POA: Diagnosis not present

## 2018-06-25 DIAGNOSIS — Z992 Dependence on renal dialysis: Secondary | ICD-10-CM | POA: Diagnosis not present

## 2018-06-25 DIAGNOSIS — N2581 Secondary hyperparathyroidism of renal origin: Secondary | ICD-10-CM | POA: Diagnosis not present

## 2018-06-25 DIAGNOSIS — D509 Iron deficiency anemia, unspecified: Secondary | ICD-10-CM | POA: Diagnosis not present

## 2018-06-25 DIAGNOSIS — N186 End stage renal disease: Secondary | ICD-10-CM | POA: Diagnosis not present

## 2018-06-26 DIAGNOSIS — D509 Iron deficiency anemia, unspecified: Secondary | ICD-10-CM | POA: Diagnosis not present

## 2018-06-26 DIAGNOSIS — Z992 Dependence on renal dialysis: Secondary | ICD-10-CM | POA: Diagnosis not present

## 2018-06-26 DIAGNOSIS — N2581 Secondary hyperparathyroidism of renal origin: Secondary | ICD-10-CM | POA: Diagnosis not present

## 2018-06-26 DIAGNOSIS — N186 End stage renal disease: Secondary | ICD-10-CM | POA: Diagnosis not present

## 2018-06-27 DIAGNOSIS — Z992 Dependence on renal dialysis: Secondary | ICD-10-CM | POA: Diagnosis not present

## 2018-06-27 DIAGNOSIS — N186 End stage renal disease: Secondary | ICD-10-CM | POA: Diagnosis not present

## 2018-06-27 DIAGNOSIS — D509 Iron deficiency anemia, unspecified: Secondary | ICD-10-CM | POA: Diagnosis not present

## 2018-06-27 DIAGNOSIS — N2581 Secondary hyperparathyroidism of renal origin: Secondary | ICD-10-CM | POA: Diagnosis not present

## 2018-06-28 DIAGNOSIS — Z992 Dependence on renal dialysis: Secondary | ICD-10-CM | POA: Diagnosis not present

## 2018-06-28 DIAGNOSIS — N186 End stage renal disease: Secondary | ICD-10-CM | POA: Diagnosis not present

## 2018-06-28 DIAGNOSIS — N2581 Secondary hyperparathyroidism of renal origin: Secondary | ICD-10-CM | POA: Diagnosis not present

## 2018-06-28 DIAGNOSIS — D509 Iron deficiency anemia, unspecified: Secondary | ICD-10-CM | POA: Diagnosis not present

## 2018-06-29 DIAGNOSIS — N186 End stage renal disease: Secondary | ICD-10-CM | POA: Diagnosis not present

## 2018-06-29 DIAGNOSIS — N2581 Secondary hyperparathyroidism of renal origin: Secondary | ICD-10-CM | POA: Diagnosis not present

## 2018-06-29 DIAGNOSIS — Z992 Dependence on renal dialysis: Secondary | ICD-10-CM | POA: Diagnosis not present

## 2018-06-29 DIAGNOSIS — D509 Iron deficiency anemia, unspecified: Secondary | ICD-10-CM | POA: Diagnosis not present

## 2018-06-30 DIAGNOSIS — N186 End stage renal disease: Secondary | ICD-10-CM | POA: Diagnosis not present

## 2018-06-30 DIAGNOSIS — D509 Iron deficiency anemia, unspecified: Secondary | ICD-10-CM | POA: Diagnosis not present

## 2018-06-30 DIAGNOSIS — N2581 Secondary hyperparathyroidism of renal origin: Secondary | ICD-10-CM | POA: Diagnosis not present

## 2018-06-30 DIAGNOSIS — Z992 Dependence on renal dialysis: Secondary | ICD-10-CM | POA: Diagnosis not present

## 2018-07-01 ENCOUNTER — Ambulatory Visit (INDEPENDENT_AMBULATORY_CARE_PROVIDER_SITE_OTHER): Payer: Medicare Other | Admitting: Physician Assistant

## 2018-07-01 VITALS — BP 142/72 | HR 77 | Temp 97.2°F | Resp 16 | Ht 73.0 in | Wt 167.0 lb

## 2018-07-01 DIAGNOSIS — N186 End stage renal disease: Secondary | ICD-10-CM

## 2018-07-01 DIAGNOSIS — D509 Iron deficiency anemia, unspecified: Secondary | ICD-10-CM | POA: Diagnosis not present

## 2018-07-01 DIAGNOSIS — N2581 Secondary hyperparathyroidism of renal origin: Secondary | ICD-10-CM | POA: Diagnosis not present

## 2018-07-01 DIAGNOSIS — Z992 Dependence on renal dialysis: Secondary | ICD-10-CM | POA: Diagnosis not present

## 2018-07-01 NOTE — Progress Notes (Addendum)
Established Dialysis Access   History of Present Illness   Rodney Gordon is a 62 y.o. (08/07/1955) male who presents for re-evaluation of permanent access.  He is status post right brachiocephalic fistula by Dr. Oneida Alar in 11/2016.  He subsequently underwent fistulogram with venoplasty and 3 tandem areas of stenosis by Dr. Scot Dock 09/05/2017.  Over the past at least 6 months he has been using peritoneal dialysis at home.  He states he went to [facility] about a week to 10 days ago for lab work and the lab technician drew blood from his right arm AV fistula.  He states that he no longer felt a vibration or pulse through his fistula since his lab work was done.  He has some soreness along the thrombosed cephalic vein fistula in his upper arm.  This soreness has improved over the past 48 hours.  He has not had any difficulty with peritoneal dialysis.  ESRD is managed by [name].  Current Outpatient Medications  Medication Sig Dispense Refill  . acetaminophen (TYLENOL) 500 MG tablet Take 1,000 mg by mouth 2 (two) times daily as needed for mild pain.    Marland Kitchen acetaminophen (TYLENOL) 650 MG CR tablet Take 650 mg by mouth daily as needed for pain.    Marland Kitchen amLODipine (NORVASC) 10 MG tablet Take 1 tablet (10 mg total) by mouth daily. 30 tablet 6  . aspirin 81 MG tablet Take 81 mg by mouth daily.     Marland Kitchen atenolol (TENORMIN) 100 MG tablet Take 100 mg by mouth daily.     . calcium acetate (PHOSLO) 667 MG capsule Take 667 mg by mouth 3 (three) times daily with meals.    . diphenhydramine-acetaminophen (TYLENOL PM) 25-500 MG TABS tablet Take 1 tablet by mouth 2 (two) times daily as needed (PAIN).    Marland Kitchen doxazosin (CARDURA) 8 MG tablet Take 8 mg by mouth daily.    Marland Kitchen ibuprofen (ADVIL,MOTRIN) 200 MG tablet Take 200 mg by mouth every 6 (six) hours as needed (for apin.).    Marland Kitchen nitroGLYCERIN (NITROSTAT) 0.4 MG SL tablet Place 1 tablet (0.4 mg total) under the tongue as directed. (Patient taking differently:  Place 0.4 mg under the tongue every 5 (five) minutes as needed for chest pain. ) 25 tablet 2  . oxyCODONE-acetaminophen (PERCOCET/ROXICET) 5-325 MG tablet Take 1 tablet by mouth every 6 (six) hours as needed for severe pain. 20 tablet 0  . sodium bicarbonate 650 MG tablet Take 1,300 mg by mouth 3 (three) times daily.     . isosorbide mononitrate (IMDUR) 30 MG 24 hr tablet Take 30 mg by mouth daily.    Marland Kitchen lidocaine-prilocaine (EMLA) cream Apply 1 application topically as needed (prior to dialysis).      No current facility-administered medications for this visit.     On ROS today: 10 system ROS is negative unless otherwise noted in HPI   Physical Examination   Vitals:   07/01/18 1446  BP: (!) 142/72  Pulse: 77  Resp: 16  Temp: (!) 97.2 F (36.2 C)  SpO2: 98%  Weight: 167 lb (75.8 kg)  Height: 6\' 1"  (1.854 m)   Body mass index is 22.03 kg/m.  General Alert, O x 3, WD,  Pulmonary Sym exp, good B air movt,   Cardiac RRR, Nl S1, S2,  Vascular Vessel Right Left  Radial Palpable Palpable  Ulnar Not palpable Not palpable    Musculo- skeletal  no palpable thrill or audible bruit through right  arm brachiocephalic fistula; palpable right radial pulse; palpable hard scarred fistula right upper arm M/S 5/5 throughout  , Extremities without ischemic changes    Neurologic A&O; CN grossly intact        Medical Decision Making   Rodney Gordon is a 62 y.o. male who presents with ESRD on peritoneal dialysis.    Based on history and physical exam right arm brachiocephalic fistula likely occluded over a week ago  Patient has not had any difficulty or problems with peritoneal dialysis  If patient's nephrologist would prefer patient to have a permanent hemodialysis access patient will likely require vein mapping prior to scheduling a new access surgery  He has a appointment with his nephrologist in the next few weeks.  He will call the office to schedule his vein mapping if his  nephrologist would like a new hemodialysis access.  Assured patient that soreness in area of brachiocephalic fistula is likely superficial thrombophlebitis related to thrombosis of fistula; this will likely resolve in the next week with conservative measures including warm compress   Dagoberto Ligas PA-C [facility] Office: [phone]

## 2018-07-02 DIAGNOSIS — Z992 Dependence on renal dialysis: Secondary | ICD-10-CM | POA: Diagnosis not present

## 2018-07-02 DIAGNOSIS — N186 End stage renal disease: Secondary | ICD-10-CM | POA: Diagnosis not present

## 2018-07-02 DIAGNOSIS — N2581 Secondary hyperparathyroidism of renal origin: Secondary | ICD-10-CM | POA: Diagnosis not present

## 2018-07-02 DIAGNOSIS — D509 Iron deficiency anemia, unspecified: Secondary | ICD-10-CM | POA: Diagnosis not present

## 2018-07-03 DIAGNOSIS — N2581 Secondary hyperparathyroidism of renal origin: Secondary | ICD-10-CM | POA: Diagnosis not present

## 2018-07-03 DIAGNOSIS — D509 Iron deficiency anemia, unspecified: Secondary | ICD-10-CM | POA: Diagnosis not present

## 2018-07-03 DIAGNOSIS — N186 End stage renal disease: Secondary | ICD-10-CM | POA: Diagnosis not present

## 2018-07-03 DIAGNOSIS — Z992 Dependence on renal dialysis: Secondary | ICD-10-CM | POA: Diagnosis not present

## 2018-07-04 DIAGNOSIS — N2581 Secondary hyperparathyroidism of renal origin: Secondary | ICD-10-CM | POA: Diagnosis not present

## 2018-07-04 DIAGNOSIS — D509 Iron deficiency anemia, unspecified: Secondary | ICD-10-CM | POA: Diagnosis not present

## 2018-07-04 DIAGNOSIS — N186 End stage renal disease: Secondary | ICD-10-CM | POA: Diagnosis not present

## 2018-07-04 DIAGNOSIS — Z992 Dependence on renal dialysis: Secondary | ICD-10-CM | POA: Diagnosis not present

## 2018-07-05 DIAGNOSIS — D509 Iron deficiency anemia, unspecified: Secondary | ICD-10-CM | POA: Diagnosis not present

## 2018-07-05 DIAGNOSIS — Z992 Dependence on renal dialysis: Secondary | ICD-10-CM | POA: Diagnosis not present

## 2018-07-05 DIAGNOSIS — N2581 Secondary hyperparathyroidism of renal origin: Secondary | ICD-10-CM | POA: Diagnosis not present

## 2018-07-05 DIAGNOSIS — N186 End stage renal disease: Secondary | ICD-10-CM | POA: Diagnosis not present

## 2018-07-06 DIAGNOSIS — N186 End stage renal disease: Secondary | ICD-10-CM | POA: Diagnosis not present

## 2018-07-06 DIAGNOSIS — D509 Iron deficiency anemia, unspecified: Secondary | ICD-10-CM | POA: Diagnosis not present

## 2018-07-06 DIAGNOSIS — Z992 Dependence on renal dialysis: Secondary | ICD-10-CM | POA: Diagnosis not present

## 2018-07-06 DIAGNOSIS — N2581 Secondary hyperparathyroidism of renal origin: Secondary | ICD-10-CM | POA: Diagnosis not present

## 2018-07-07 DIAGNOSIS — Z992 Dependence on renal dialysis: Secondary | ICD-10-CM | POA: Diagnosis not present

## 2018-07-07 DIAGNOSIS — N2581 Secondary hyperparathyroidism of renal origin: Secondary | ICD-10-CM | POA: Diagnosis not present

## 2018-07-07 DIAGNOSIS — D509 Iron deficiency anemia, unspecified: Secondary | ICD-10-CM | POA: Diagnosis not present

## 2018-07-07 DIAGNOSIS — N186 End stage renal disease: Secondary | ICD-10-CM | POA: Diagnosis not present

## 2018-07-08 DIAGNOSIS — N186 End stage renal disease: Secondary | ICD-10-CM | POA: Diagnosis not present

## 2018-07-08 DIAGNOSIS — N2581 Secondary hyperparathyroidism of renal origin: Secondary | ICD-10-CM | POA: Diagnosis not present

## 2018-07-08 DIAGNOSIS — D509 Iron deficiency anemia, unspecified: Secondary | ICD-10-CM | POA: Diagnosis not present

## 2018-07-08 DIAGNOSIS — Z992 Dependence on renal dialysis: Secondary | ICD-10-CM | POA: Diagnosis not present

## 2018-07-09 DIAGNOSIS — D509 Iron deficiency anemia, unspecified: Secondary | ICD-10-CM | POA: Diagnosis not present

## 2018-07-09 DIAGNOSIS — Z992 Dependence on renal dialysis: Secondary | ICD-10-CM | POA: Diagnosis not present

## 2018-07-09 DIAGNOSIS — N2581 Secondary hyperparathyroidism of renal origin: Secondary | ICD-10-CM | POA: Diagnosis not present

## 2018-07-09 DIAGNOSIS — N186 End stage renal disease: Secondary | ICD-10-CM | POA: Diagnosis not present

## 2018-07-10 DIAGNOSIS — Z992 Dependence on renal dialysis: Secondary | ICD-10-CM | POA: Diagnosis not present

## 2018-07-10 DIAGNOSIS — D509 Iron deficiency anemia, unspecified: Secondary | ICD-10-CM | POA: Diagnosis not present

## 2018-07-10 DIAGNOSIS — N186 End stage renal disease: Secondary | ICD-10-CM | POA: Diagnosis not present

## 2018-07-10 DIAGNOSIS — N2581 Secondary hyperparathyroidism of renal origin: Secondary | ICD-10-CM | POA: Diagnosis not present

## 2018-07-11 DIAGNOSIS — Z992 Dependence on renal dialysis: Secondary | ICD-10-CM | POA: Diagnosis not present

## 2018-07-11 DIAGNOSIS — N2581 Secondary hyperparathyroidism of renal origin: Secondary | ICD-10-CM | POA: Diagnosis not present

## 2018-07-11 DIAGNOSIS — N186 End stage renal disease: Secondary | ICD-10-CM | POA: Diagnosis not present

## 2018-07-11 DIAGNOSIS — D509 Iron deficiency anemia, unspecified: Secondary | ICD-10-CM | POA: Diagnosis not present

## 2018-07-12 DIAGNOSIS — D509 Iron deficiency anemia, unspecified: Secondary | ICD-10-CM | POA: Diagnosis not present

## 2018-07-12 DIAGNOSIS — Z992 Dependence on renal dialysis: Secondary | ICD-10-CM | POA: Diagnosis not present

## 2018-07-12 DIAGNOSIS — N186 End stage renal disease: Secondary | ICD-10-CM | POA: Diagnosis not present

## 2018-07-12 DIAGNOSIS — N2581 Secondary hyperparathyroidism of renal origin: Secondary | ICD-10-CM | POA: Diagnosis not present

## 2018-07-13 DIAGNOSIS — D509 Iron deficiency anemia, unspecified: Secondary | ICD-10-CM | POA: Diagnosis not present

## 2018-07-13 DIAGNOSIS — N186 End stage renal disease: Secondary | ICD-10-CM | POA: Diagnosis not present

## 2018-07-13 DIAGNOSIS — N2581 Secondary hyperparathyroidism of renal origin: Secondary | ICD-10-CM | POA: Diagnosis not present

## 2018-07-13 DIAGNOSIS — Z992 Dependence on renal dialysis: Secondary | ICD-10-CM | POA: Diagnosis not present

## 2018-07-14 DIAGNOSIS — Z992 Dependence on renal dialysis: Secondary | ICD-10-CM | POA: Diagnosis not present

## 2018-07-14 DIAGNOSIS — N2581 Secondary hyperparathyroidism of renal origin: Secondary | ICD-10-CM | POA: Diagnosis not present

## 2018-07-14 DIAGNOSIS — D509 Iron deficiency anemia, unspecified: Secondary | ICD-10-CM | POA: Diagnosis not present

## 2018-07-14 DIAGNOSIS — N186 End stage renal disease: Secondary | ICD-10-CM | POA: Diagnosis not present

## 2018-07-15 DIAGNOSIS — Z992 Dependence on renal dialysis: Secondary | ICD-10-CM | POA: Diagnosis not present

## 2018-07-15 DIAGNOSIS — D509 Iron deficiency anemia, unspecified: Secondary | ICD-10-CM | POA: Diagnosis not present

## 2018-07-15 DIAGNOSIS — N186 End stage renal disease: Secondary | ICD-10-CM | POA: Diagnosis not present

## 2018-07-15 DIAGNOSIS — N2581 Secondary hyperparathyroidism of renal origin: Secondary | ICD-10-CM | POA: Diagnosis not present

## 2018-07-16 DIAGNOSIS — Z992 Dependence on renal dialysis: Secondary | ICD-10-CM | POA: Diagnosis not present

## 2018-07-16 DIAGNOSIS — N2581 Secondary hyperparathyroidism of renal origin: Secondary | ICD-10-CM | POA: Diagnosis not present

## 2018-07-16 DIAGNOSIS — N186 End stage renal disease: Secondary | ICD-10-CM | POA: Diagnosis not present

## 2018-07-16 DIAGNOSIS — D509 Iron deficiency anemia, unspecified: Secondary | ICD-10-CM | POA: Diagnosis not present

## 2018-07-17 DIAGNOSIS — D509 Iron deficiency anemia, unspecified: Secondary | ICD-10-CM | POA: Diagnosis not present

## 2018-07-17 DIAGNOSIS — N2581 Secondary hyperparathyroidism of renal origin: Secondary | ICD-10-CM | POA: Diagnosis not present

## 2018-07-17 DIAGNOSIS — N186 End stage renal disease: Secondary | ICD-10-CM | POA: Diagnosis not present

## 2018-07-17 DIAGNOSIS — Z992 Dependence on renal dialysis: Secondary | ICD-10-CM | POA: Diagnosis not present

## 2018-07-18 DIAGNOSIS — N186 End stage renal disease: Secondary | ICD-10-CM | POA: Diagnosis not present

## 2018-07-18 DIAGNOSIS — D509 Iron deficiency anemia, unspecified: Secondary | ICD-10-CM | POA: Diagnosis not present

## 2018-07-18 DIAGNOSIS — N2581 Secondary hyperparathyroidism of renal origin: Secondary | ICD-10-CM | POA: Diagnosis not present

## 2018-07-18 DIAGNOSIS — Z992 Dependence on renal dialysis: Secondary | ICD-10-CM | POA: Diagnosis not present

## 2018-07-19 DIAGNOSIS — Z992 Dependence on renal dialysis: Secondary | ICD-10-CM | POA: Diagnosis not present

## 2018-07-19 DIAGNOSIS — D509 Iron deficiency anemia, unspecified: Secondary | ICD-10-CM | POA: Diagnosis not present

## 2018-07-19 DIAGNOSIS — N186 End stage renal disease: Secondary | ICD-10-CM | POA: Diagnosis not present

## 2018-07-19 DIAGNOSIS — N2581 Secondary hyperparathyroidism of renal origin: Secondary | ICD-10-CM | POA: Diagnosis not present

## 2018-07-20 DIAGNOSIS — N186 End stage renal disease: Secondary | ICD-10-CM | POA: Diagnosis not present

## 2018-07-20 DIAGNOSIS — N2581 Secondary hyperparathyroidism of renal origin: Secondary | ICD-10-CM | POA: Diagnosis not present

## 2018-07-20 DIAGNOSIS — Z992 Dependence on renal dialysis: Secondary | ICD-10-CM | POA: Diagnosis not present

## 2018-07-20 DIAGNOSIS — D509 Iron deficiency anemia, unspecified: Secondary | ICD-10-CM | POA: Diagnosis not present

## 2018-07-21 DIAGNOSIS — N186 End stage renal disease: Secondary | ICD-10-CM | POA: Diagnosis not present

## 2018-07-21 DIAGNOSIS — D509 Iron deficiency anemia, unspecified: Secondary | ICD-10-CM | POA: Diagnosis not present

## 2018-07-21 DIAGNOSIS — N2581 Secondary hyperparathyroidism of renal origin: Secondary | ICD-10-CM | POA: Diagnosis not present

## 2018-07-21 DIAGNOSIS — Z992 Dependence on renal dialysis: Secondary | ICD-10-CM | POA: Diagnosis not present

## 2018-07-22 DIAGNOSIS — D509 Iron deficiency anemia, unspecified: Secondary | ICD-10-CM | POA: Diagnosis not present

## 2018-07-22 DIAGNOSIS — N2581 Secondary hyperparathyroidism of renal origin: Secondary | ICD-10-CM | POA: Diagnosis not present

## 2018-07-22 DIAGNOSIS — Z992 Dependence on renal dialysis: Secondary | ICD-10-CM | POA: Diagnosis not present

## 2018-07-22 DIAGNOSIS — N186 End stage renal disease: Secondary | ICD-10-CM | POA: Diagnosis not present

## 2018-07-23 DIAGNOSIS — Z992 Dependence on renal dialysis: Secondary | ICD-10-CM | POA: Diagnosis not present

## 2018-07-23 DIAGNOSIS — N2581 Secondary hyperparathyroidism of renal origin: Secondary | ICD-10-CM | POA: Diagnosis not present

## 2018-07-23 DIAGNOSIS — N186 End stage renal disease: Secondary | ICD-10-CM | POA: Diagnosis not present

## 2018-07-23 DIAGNOSIS — D509 Iron deficiency anemia, unspecified: Secondary | ICD-10-CM | POA: Diagnosis not present

## 2018-07-24 DIAGNOSIS — N2581 Secondary hyperparathyroidism of renal origin: Secondary | ICD-10-CM | POA: Diagnosis not present

## 2018-07-24 DIAGNOSIS — D509 Iron deficiency anemia, unspecified: Secondary | ICD-10-CM | POA: Diagnosis not present

## 2018-07-24 DIAGNOSIS — Z992 Dependence on renal dialysis: Secondary | ICD-10-CM | POA: Diagnosis not present

## 2018-07-24 DIAGNOSIS — N186 End stage renal disease: Secondary | ICD-10-CM | POA: Diagnosis not present

## 2018-07-25 DIAGNOSIS — N186 End stage renal disease: Secondary | ICD-10-CM | POA: Diagnosis not present

## 2018-07-25 DIAGNOSIS — D509 Iron deficiency anemia, unspecified: Secondary | ICD-10-CM | POA: Diagnosis not present

## 2018-07-25 DIAGNOSIS — N2581 Secondary hyperparathyroidism of renal origin: Secondary | ICD-10-CM | POA: Diagnosis not present

## 2018-07-25 DIAGNOSIS — Z992 Dependence on renal dialysis: Secondary | ICD-10-CM | POA: Diagnosis not present

## 2018-07-26 DIAGNOSIS — N186 End stage renal disease: Secondary | ICD-10-CM | POA: Diagnosis not present

## 2018-07-26 DIAGNOSIS — D509 Iron deficiency anemia, unspecified: Secondary | ICD-10-CM | POA: Diagnosis not present

## 2018-07-26 DIAGNOSIS — Z992 Dependence on renal dialysis: Secondary | ICD-10-CM | POA: Diagnosis not present

## 2018-07-26 DIAGNOSIS — N2581 Secondary hyperparathyroidism of renal origin: Secondary | ICD-10-CM | POA: Diagnosis not present

## 2018-07-27 DIAGNOSIS — Z992 Dependence on renal dialysis: Secondary | ICD-10-CM | POA: Diagnosis not present

## 2018-07-27 DIAGNOSIS — N2581 Secondary hyperparathyroidism of renal origin: Secondary | ICD-10-CM | POA: Diagnosis not present

## 2018-07-27 DIAGNOSIS — D509 Iron deficiency anemia, unspecified: Secondary | ICD-10-CM | POA: Diagnosis not present

## 2018-07-27 DIAGNOSIS — N186 End stage renal disease: Secondary | ICD-10-CM | POA: Diagnosis not present

## 2018-07-28 DIAGNOSIS — N2581 Secondary hyperparathyroidism of renal origin: Secondary | ICD-10-CM | POA: Diagnosis not present

## 2018-07-28 DIAGNOSIS — D509 Iron deficiency anemia, unspecified: Secondary | ICD-10-CM | POA: Diagnosis not present

## 2018-07-28 DIAGNOSIS — Z992 Dependence on renal dialysis: Secondary | ICD-10-CM | POA: Diagnosis not present

## 2018-07-28 DIAGNOSIS — N186 End stage renal disease: Secondary | ICD-10-CM | POA: Diagnosis not present

## 2018-07-29 DIAGNOSIS — Z992 Dependence on renal dialysis: Secondary | ICD-10-CM | POA: Diagnosis not present

## 2018-07-29 DIAGNOSIS — N2581 Secondary hyperparathyroidism of renal origin: Secondary | ICD-10-CM | POA: Diagnosis not present

## 2018-07-29 DIAGNOSIS — N186 End stage renal disease: Secondary | ICD-10-CM | POA: Diagnosis not present

## 2018-07-29 DIAGNOSIS — D509 Iron deficiency anemia, unspecified: Secondary | ICD-10-CM | POA: Diagnosis not present

## 2018-07-30 DIAGNOSIS — D509 Iron deficiency anemia, unspecified: Secondary | ICD-10-CM | POA: Diagnosis not present

## 2018-07-30 DIAGNOSIS — N186 End stage renal disease: Secondary | ICD-10-CM | POA: Diagnosis not present

## 2018-07-30 DIAGNOSIS — Z992 Dependence on renal dialysis: Secondary | ICD-10-CM | POA: Diagnosis not present

## 2018-07-30 DIAGNOSIS — N2581 Secondary hyperparathyroidism of renal origin: Secondary | ICD-10-CM | POA: Diagnosis not present

## 2018-07-31 DIAGNOSIS — N186 End stage renal disease: Secondary | ICD-10-CM | POA: Diagnosis not present

## 2018-07-31 DIAGNOSIS — N2581 Secondary hyperparathyroidism of renal origin: Secondary | ICD-10-CM | POA: Diagnosis not present

## 2018-07-31 DIAGNOSIS — Z992 Dependence on renal dialysis: Secondary | ICD-10-CM | POA: Diagnosis not present

## 2018-07-31 DIAGNOSIS — D509 Iron deficiency anemia, unspecified: Secondary | ICD-10-CM | POA: Diagnosis not present

## 2018-08-01 DIAGNOSIS — D509 Iron deficiency anemia, unspecified: Secondary | ICD-10-CM | POA: Diagnosis not present

## 2018-08-01 DIAGNOSIS — N2581 Secondary hyperparathyroidism of renal origin: Secondary | ICD-10-CM | POA: Diagnosis not present

## 2018-08-01 DIAGNOSIS — Z992 Dependence on renal dialysis: Secondary | ICD-10-CM | POA: Diagnosis not present

## 2018-08-01 DIAGNOSIS — N186 End stage renal disease: Secondary | ICD-10-CM | POA: Diagnosis not present

## 2018-08-02 DIAGNOSIS — D509 Iron deficiency anemia, unspecified: Secondary | ICD-10-CM | POA: Diagnosis not present

## 2018-08-02 DIAGNOSIS — N2581 Secondary hyperparathyroidism of renal origin: Secondary | ICD-10-CM | POA: Diagnosis not present

## 2018-08-02 DIAGNOSIS — Z992 Dependence on renal dialysis: Secondary | ICD-10-CM | POA: Diagnosis not present

## 2018-08-02 DIAGNOSIS — N186 End stage renal disease: Secondary | ICD-10-CM | POA: Diagnosis not present

## 2018-08-03 DIAGNOSIS — N186 End stage renal disease: Secondary | ICD-10-CM | POA: Diagnosis not present

## 2018-08-03 DIAGNOSIS — N2581 Secondary hyperparathyroidism of renal origin: Secondary | ICD-10-CM | POA: Diagnosis not present

## 2018-08-03 DIAGNOSIS — Z992 Dependence on renal dialysis: Secondary | ICD-10-CM | POA: Diagnosis not present

## 2018-08-03 DIAGNOSIS — D509 Iron deficiency anemia, unspecified: Secondary | ICD-10-CM | POA: Diagnosis not present

## 2018-08-04 DIAGNOSIS — N2581 Secondary hyperparathyroidism of renal origin: Secondary | ICD-10-CM | POA: Diagnosis not present

## 2018-08-04 DIAGNOSIS — N186 End stage renal disease: Secondary | ICD-10-CM | POA: Diagnosis not present

## 2018-08-04 DIAGNOSIS — D509 Iron deficiency anemia, unspecified: Secondary | ICD-10-CM | POA: Diagnosis not present

## 2018-08-04 DIAGNOSIS — Z992 Dependence on renal dialysis: Secondary | ICD-10-CM | POA: Diagnosis not present

## 2018-08-05 DIAGNOSIS — N186 End stage renal disease: Secondary | ICD-10-CM | POA: Diagnosis not present

## 2018-08-05 DIAGNOSIS — Z992 Dependence on renal dialysis: Secondary | ICD-10-CM | POA: Diagnosis not present

## 2018-08-05 DIAGNOSIS — D509 Iron deficiency anemia, unspecified: Secondary | ICD-10-CM | POA: Diagnosis not present

## 2018-08-05 DIAGNOSIS — N2581 Secondary hyperparathyroidism of renal origin: Secondary | ICD-10-CM | POA: Diagnosis not present

## 2018-08-06 DIAGNOSIS — N2581 Secondary hyperparathyroidism of renal origin: Secondary | ICD-10-CM | POA: Diagnosis not present

## 2018-08-06 DIAGNOSIS — D509 Iron deficiency anemia, unspecified: Secondary | ICD-10-CM | POA: Diagnosis not present

## 2018-08-06 DIAGNOSIS — N186 End stage renal disease: Secondary | ICD-10-CM | POA: Diagnosis not present

## 2018-08-06 DIAGNOSIS — Z992 Dependence on renal dialysis: Secondary | ICD-10-CM | POA: Diagnosis not present

## 2018-08-07 DIAGNOSIS — N186 End stage renal disease: Secondary | ICD-10-CM | POA: Diagnosis not present

## 2018-08-07 DIAGNOSIS — Z992 Dependence on renal dialysis: Secondary | ICD-10-CM | POA: Diagnosis not present

## 2018-08-07 DIAGNOSIS — D509 Iron deficiency anemia, unspecified: Secondary | ICD-10-CM | POA: Diagnosis not present

## 2018-08-07 DIAGNOSIS — N2581 Secondary hyperparathyroidism of renal origin: Secondary | ICD-10-CM | POA: Diagnosis not present

## 2018-08-08 DIAGNOSIS — Z992 Dependence on renal dialysis: Secondary | ICD-10-CM | POA: Diagnosis not present

## 2018-08-08 DIAGNOSIS — N2581 Secondary hyperparathyroidism of renal origin: Secondary | ICD-10-CM | POA: Diagnosis not present

## 2018-08-08 DIAGNOSIS — D509 Iron deficiency anemia, unspecified: Secondary | ICD-10-CM | POA: Diagnosis not present

## 2018-08-08 DIAGNOSIS — N186 End stage renal disease: Secondary | ICD-10-CM | POA: Diagnosis not present

## 2018-08-09 DIAGNOSIS — N186 End stage renal disease: Secondary | ICD-10-CM | POA: Diagnosis not present

## 2018-08-09 DIAGNOSIS — D509 Iron deficiency anemia, unspecified: Secondary | ICD-10-CM | POA: Diagnosis not present

## 2018-08-09 DIAGNOSIS — N2581 Secondary hyperparathyroidism of renal origin: Secondary | ICD-10-CM | POA: Diagnosis not present

## 2018-08-09 DIAGNOSIS — Z992 Dependence on renal dialysis: Secondary | ICD-10-CM | POA: Diagnosis not present

## 2018-08-10 DIAGNOSIS — N186 End stage renal disease: Secondary | ICD-10-CM | POA: Diagnosis not present

## 2018-08-10 DIAGNOSIS — Z992 Dependence on renal dialysis: Secondary | ICD-10-CM | POA: Diagnosis not present

## 2018-08-10 DIAGNOSIS — D509 Iron deficiency anemia, unspecified: Secondary | ICD-10-CM | POA: Diagnosis not present

## 2018-08-10 DIAGNOSIS — N2581 Secondary hyperparathyroidism of renal origin: Secondary | ICD-10-CM | POA: Diagnosis not present

## 2018-08-11 DIAGNOSIS — N2581 Secondary hyperparathyroidism of renal origin: Secondary | ICD-10-CM | POA: Diagnosis not present

## 2018-08-11 DIAGNOSIS — N186 End stage renal disease: Secondary | ICD-10-CM | POA: Diagnosis not present

## 2018-08-11 DIAGNOSIS — Z992 Dependence on renal dialysis: Secondary | ICD-10-CM | POA: Diagnosis not present

## 2018-08-11 DIAGNOSIS — D509 Iron deficiency anemia, unspecified: Secondary | ICD-10-CM | POA: Diagnosis not present

## 2018-08-12 DIAGNOSIS — Z992 Dependence on renal dialysis: Secondary | ICD-10-CM | POA: Diagnosis not present

## 2018-08-12 DIAGNOSIS — D509 Iron deficiency anemia, unspecified: Secondary | ICD-10-CM | POA: Diagnosis not present

## 2018-08-12 DIAGNOSIS — N2581 Secondary hyperparathyroidism of renal origin: Secondary | ICD-10-CM | POA: Diagnosis not present

## 2018-08-12 DIAGNOSIS — N186 End stage renal disease: Secondary | ICD-10-CM | POA: Diagnosis not present

## 2018-08-13 DIAGNOSIS — N186 End stage renal disease: Secondary | ICD-10-CM | POA: Diagnosis not present

## 2018-08-13 DIAGNOSIS — Z992 Dependence on renal dialysis: Secondary | ICD-10-CM | POA: Diagnosis not present

## 2018-08-13 DIAGNOSIS — D509 Iron deficiency anemia, unspecified: Secondary | ICD-10-CM | POA: Diagnosis not present

## 2018-08-13 DIAGNOSIS — N2581 Secondary hyperparathyroidism of renal origin: Secondary | ICD-10-CM | POA: Diagnosis not present

## 2018-08-14 DIAGNOSIS — Z992 Dependence on renal dialysis: Secondary | ICD-10-CM | POA: Diagnosis not present

## 2018-08-14 DIAGNOSIS — N186 End stage renal disease: Secondary | ICD-10-CM | POA: Diagnosis not present

## 2018-08-14 DIAGNOSIS — N2581 Secondary hyperparathyroidism of renal origin: Secondary | ICD-10-CM | POA: Diagnosis not present

## 2018-08-14 DIAGNOSIS — D509 Iron deficiency anemia, unspecified: Secondary | ICD-10-CM | POA: Diagnosis not present

## 2018-08-15 DIAGNOSIS — N186 End stage renal disease: Secondary | ICD-10-CM | POA: Diagnosis not present

## 2018-08-15 DIAGNOSIS — N2581 Secondary hyperparathyroidism of renal origin: Secondary | ICD-10-CM | POA: Diagnosis not present

## 2018-08-15 DIAGNOSIS — Z992 Dependence on renal dialysis: Secondary | ICD-10-CM | POA: Diagnosis not present

## 2018-08-15 DIAGNOSIS — D509 Iron deficiency anemia, unspecified: Secondary | ICD-10-CM | POA: Diagnosis not present

## 2018-08-16 DIAGNOSIS — Z992 Dependence on renal dialysis: Secondary | ICD-10-CM | POA: Diagnosis not present

## 2018-08-16 DIAGNOSIS — N2581 Secondary hyperparathyroidism of renal origin: Secondary | ICD-10-CM | POA: Diagnosis not present

## 2018-08-16 DIAGNOSIS — N186 End stage renal disease: Secondary | ICD-10-CM | POA: Diagnosis not present

## 2018-08-16 DIAGNOSIS — D509 Iron deficiency anemia, unspecified: Secondary | ICD-10-CM | POA: Diagnosis not present

## 2018-08-17 DIAGNOSIS — D509 Iron deficiency anemia, unspecified: Secondary | ICD-10-CM | POA: Diagnosis not present

## 2018-08-17 DIAGNOSIS — Z992 Dependence on renal dialysis: Secondary | ICD-10-CM | POA: Diagnosis not present

## 2018-08-17 DIAGNOSIS — N2581 Secondary hyperparathyroidism of renal origin: Secondary | ICD-10-CM | POA: Diagnosis not present

## 2018-08-17 DIAGNOSIS — N186 End stage renal disease: Secondary | ICD-10-CM | POA: Diagnosis not present

## 2018-08-18 DIAGNOSIS — N186 End stage renal disease: Secondary | ICD-10-CM | POA: Diagnosis not present

## 2018-08-18 DIAGNOSIS — Z992 Dependence on renal dialysis: Secondary | ICD-10-CM | POA: Diagnosis not present

## 2018-08-18 DIAGNOSIS — D509 Iron deficiency anemia, unspecified: Secondary | ICD-10-CM | POA: Diagnosis not present

## 2018-08-18 DIAGNOSIS — N2581 Secondary hyperparathyroidism of renal origin: Secondary | ICD-10-CM | POA: Diagnosis not present

## 2018-08-19 DIAGNOSIS — N186 End stage renal disease: Secondary | ICD-10-CM | POA: Diagnosis not present

## 2018-08-19 DIAGNOSIS — N2581 Secondary hyperparathyroidism of renal origin: Secondary | ICD-10-CM | POA: Diagnosis not present

## 2018-08-19 DIAGNOSIS — D509 Iron deficiency anemia, unspecified: Secondary | ICD-10-CM | POA: Diagnosis not present

## 2018-08-19 DIAGNOSIS — Z992 Dependence on renal dialysis: Secondary | ICD-10-CM | POA: Diagnosis not present

## 2018-08-20 DIAGNOSIS — Z992 Dependence on renal dialysis: Secondary | ICD-10-CM | POA: Diagnosis not present

## 2018-08-20 DIAGNOSIS — N2581 Secondary hyperparathyroidism of renal origin: Secondary | ICD-10-CM | POA: Diagnosis not present

## 2018-08-20 DIAGNOSIS — D509 Iron deficiency anemia, unspecified: Secondary | ICD-10-CM | POA: Diagnosis not present

## 2018-08-20 DIAGNOSIS — N186 End stage renal disease: Secondary | ICD-10-CM | POA: Diagnosis not present

## 2018-08-21 DIAGNOSIS — N186 End stage renal disease: Secondary | ICD-10-CM | POA: Diagnosis not present

## 2018-08-21 DIAGNOSIS — N2581 Secondary hyperparathyroidism of renal origin: Secondary | ICD-10-CM | POA: Diagnosis not present

## 2018-08-21 DIAGNOSIS — D509 Iron deficiency anemia, unspecified: Secondary | ICD-10-CM | POA: Diagnosis not present

## 2018-08-21 DIAGNOSIS — Z992 Dependence on renal dialysis: Secondary | ICD-10-CM | POA: Diagnosis not present

## 2018-08-22 DIAGNOSIS — D509 Iron deficiency anemia, unspecified: Secondary | ICD-10-CM | POA: Diagnosis not present

## 2018-08-22 DIAGNOSIS — N186 End stage renal disease: Secondary | ICD-10-CM | POA: Diagnosis not present

## 2018-08-22 DIAGNOSIS — Z992 Dependence on renal dialysis: Secondary | ICD-10-CM | POA: Diagnosis not present

## 2018-08-22 DIAGNOSIS — N2581 Secondary hyperparathyroidism of renal origin: Secondary | ICD-10-CM | POA: Diagnosis not present

## 2018-08-23 DIAGNOSIS — N2581 Secondary hyperparathyroidism of renal origin: Secondary | ICD-10-CM | POA: Diagnosis not present

## 2018-08-23 DIAGNOSIS — N186 End stage renal disease: Secondary | ICD-10-CM | POA: Diagnosis not present

## 2018-08-23 DIAGNOSIS — Z992 Dependence on renal dialysis: Secondary | ICD-10-CM | POA: Diagnosis not present

## 2018-08-23 DIAGNOSIS — D509 Iron deficiency anemia, unspecified: Secondary | ICD-10-CM | POA: Diagnosis not present

## 2018-08-24 DIAGNOSIS — N2581 Secondary hyperparathyroidism of renal origin: Secondary | ICD-10-CM | POA: Diagnosis not present

## 2018-08-24 DIAGNOSIS — D509 Iron deficiency anemia, unspecified: Secondary | ICD-10-CM | POA: Diagnosis not present

## 2018-08-24 DIAGNOSIS — N186 End stage renal disease: Secondary | ICD-10-CM | POA: Diagnosis not present

## 2018-08-24 DIAGNOSIS — Z992 Dependence on renal dialysis: Secondary | ICD-10-CM | POA: Diagnosis not present

## 2018-08-25 DIAGNOSIS — D509 Iron deficiency anemia, unspecified: Secondary | ICD-10-CM | POA: Diagnosis not present

## 2018-08-25 DIAGNOSIS — N186 End stage renal disease: Secondary | ICD-10-CM | POA: Diagnosis not present

## 2018-08-25 DIAGNOSIS — Z992 Dependence on renal dialysis: Secondary | ICD-10-CM | POA: Diagnosis not present

## 2018-08-25 DIAGNOSIS — N2581 Secondary hyperparathyroidism of renal origin: Secondary | ICD-10-CM | POA: Diagnosis not present

## 2018-08-26 DIAGNOSIS — N186 End stage renal disease: Secondary | ICD-10-CM | POA: Diagnosis not present

## 2018-08-26 DIAGNOSIS — Z992 Dependence on renal dialysis: Secondary | ICD-10-CM | POA: Diagnosis not present

## 2018-08-26 DIAGNOSIS — D509 Iron deficiency anemia, unspecified: Secondary | ICD-10-CM | POA: Diagnosis not present

## 2018-08-26 DIAGNOSIS — N2581 Secondary hyperparathyroidism of renal origin: Secondary | ICD-10-CM | POA: Diagnosis not present

## 2018-08-27 DIAGNOSIS — N186 End stage renal disease: Secondary | ICD-10-CM | POA: Diagnosis not present

## 2018-08-27 DIAGNOSIS — Z992 Dependence on renal dialysis: Secondary | ICD-10-CM | POA: Diagnosis not present

## 2018-08-27 DIAGNOSIS — N2581 Secondary hyperparathyroidism of renal origin: Secondary | ICD-10-CM | POA: Diagnosis not present

## 2018-08-27 DIAGNOSIS — D509 Iron deficiency anemia, unspecified: Secondary | ICD-10-CM | POA: Diagnosis not present

## 2018-08-28 DIAGNOSIS — D509 Iron deficiency anemia, unspecified: Secondary | ICD-10-CM | POA: Diagnosis not present

## 2018-08-28 DIAGNOSIS — N2581 Secondary hyperparathyroidism of renal origin: Secondary | ICD-10-CM | POA: Diagnosis not present

## 2018-08-28 DIAGNOSIS — N186 End stage renal disease: Secondary | ICD-10-CM | POA: Diagnosis not present

## 2018-08-28 DIAGNOSIS — Z992 Dependence on renal dialysis: Secondary | ICD-10-CM | POA: Diagnosis not present

## 2018-08-29 DIAGNOSIS — N2581 Secondary hyperparathyroidism of renal origin: Secondary | ICD-10-CM | POA: Diagnosis not present

## 2018-08-29 DIAGNOSIS — N186 End stage renal disease: Secondary | ICD-10-CM | POA: Diagnosis not present

## 2018-08-29 DIAGNOSIS — Z992 Dependence on renal dialysis: Secondary | ICD-10-CM | POA: Diagnosis not present

## 2018-08-29 DIAGNOSIS — D509 Iron deficiency anemia, unspecified: Secondary | ICD-10-CM | POA: Diagnosis not present

## 2018-08-30 DIAGNOSIS — D509 Iron deficiency anemia, unspecified: Secondary | ICD-10-CM | POA: Diagnosis not present

## 2018-08-30 DIAGNOSIS — N186 End stage renal disease: Secondary | ICD-10-CM | POA: Diagnosis not present

## 2018-08-30 DIAGNOSIS — N2581 Secondary hyperparathyroidism of renal origin: Secondary | ICD-10-CM | POA: Diagnosis not present

## 2018-08-30 DIAGNOSIS — Z992 Dependence on renal dialysis: Secondary | ICD-10-CM | POA: Diagnosis not present

## 2018-08-31 DIAGNOSIS — Z992 Dependence on renal dialysis: Secondary | ICD-10-CM | POA: Diagnosis not present

## 2018-08-31 DIAGNOSIS — N2581 Secondary hyperparathyroidism of renal origin: Secondary | ICD-10-CM | POA: Diagnosis not present

## 2018-08-31 DIAGNOSIS — N186 End stage renal disease: Secondary | ICD-10-CM | POA: Diagnosis not present

## 2018-08-31 DIAGNOSIS — D509 Iron deficiency anemia, unspecified: Secondary | ICD-10-CM | POA: Diagnosis not present

## 2018-09-01 DIAGNOSIS — N186 End stage renal disease: Secondary | ICD-10-CM | POA: Diagnosis not present

## 2018-09-01 DIAGNOSIS — Z992 Dependence on renal dialysis: Secondary | ICD-10-CM | POA: Diagnosis not present

## 2018-09-01 DIAGNOSIS — N2581 Secondary hyperparathyroidism of renal origin: Secondary | ICD-10-CM | POA: Diagnosis not present

## 2018-09-01 DIAGNOSIS — D509 Iron deficiency anemia, unspecified: Secondary | ICD-10-CM | POA: Diagnosis not present

## 2018-09-02 DIAGNOSIS — D509 Iron deficiency anemia, unspecified: Secondary | ICD-10-CM | POA: Diagnosis not present

## 2018-09-02 DIAGNOSIS — N186 End stage renal disease: Secondary | ICD-10-CM | POA: Diagnosis not present

## 2018-09-02 DIAGNOSIS — N2581 Secondary hyperparathyroidism of renal origin: Secondary | ICD-10-CM | POA: Diagnosis not present

## 2018-09-02 DIAGNOSIS — Z992 Dependence on renal dialysis: Secondary | ICD-10-CM | POA: Diagnosis not present

## 2018-09-03 DIAGNOSIS — Z992 Dependence on renal dialysis: Secondary | ICD-10-CM | POA: Diagnosis not present

## 2018-09-03 DIAGNOSIS — N2581 Secondary hyperparathyroidism of renal origin: Secondary | ICD-10-CM | POA: Diagnosis not present

## 2018-09-03 DIAGNOSIS — D509 Iron deficiency anemia, unspecified: Secondary | ICD-10-CM | POA: Diagnosis not present

## 2018-09-03 DIAGNOSIS — N186 End stage renal disease: Secondary | ICD-10-CM | POA: Diagnosis not present

## 2018-09-04 DIAGNOSIS — N2581 Secondary hyperparathyroidism of renal origin: Secondary | ICD-10-CM | POA: Diagnosis not present

## 2018-09-04 DIAGNOSIS — Z992 Dependence on renal dialysis: Secondary | ICD-10-CM | POA: Diagnosis not present

## 2018-09-04 DIAGNOSIS — D509 Iron deficiency anemia, unspecified: Secondary | ICD-10-CM | POA: Diagnosis not present

## 2018-09-04 DIAGNOSIS — N186 End stage renal disease: Secondary | ICD-10-CM | POA: Diagnosis not present

## 2018-09-05 DIAGNOSIS — N2581 Secondary hyperparathyroidism of renal origin: Secondary | ICD-10-CM | POA: Diagnosis not present

## 2018-09-05 DIAGNOSIS — D509 Iron deficiency anemia, unspecified: Secondary | ICD-10-CM | POA: Diagnosis not present

## 2018-09-05 DIAGNOSIS — Z992 Dependence on renal dialysis: Secondary | ICD-10-CM | POA: Diagnosis not present

## 2018-09-05 DIAGNOSIS — N186 End stage renal disease: Secondary | ICD-10-CM | POA: Diagnosis not present

## 2018-09-06 DIAGNOSIS — N2581 Secondary hyperparathyroidism of renal origin: Secondary | ICD-10-CM | POA: Diagnosis not present

## 2018-09-06 DIAGNOSIS — N186 End stage renal disease: Secondary | ICD-10-CM | POA: Diagnosis not present

## 2018-09-06 DIAGNOSIS — Z992 Dependence on renal dialysis: Secondary | ICD-10-CM | POA: Diagnosis not present

## 2018-09-06 DIAGNOSIS — D509 Iron deficiency anemia, unspecified: Secondary | ICD-10-CM | POA: Diagnosis not present

## 2018-09-07 DIAGNOSIS — D509 Iron deficiency anemia, unspecified: Secondary | ICD-10-CM | POA: Diagnosis not present

## 2018-09-07 DIAGNOSIS — Z992 Dependence on renal dialysis: Secondary | ICD-10-CM | POA: Diagnosis not present

## 2018-09-07 DIAGNOSIS — N2581 Secondary hyperparathyroidism of renal origin: Secondary | ICD-10-CM | POA: Diagnosis not present

## 2018-09-07 DIAGNOSIS — N186 End stage renal disease: Secondary | ICD-10-CM | POA: Diagnosis not present

## 2018-09-08 DIAGNOSIS — N186 End stage renal disease: Secondary | ICD-10-CM | POA: Diagnosis not present

## 2018-09-08 DIAGNOSIS — D509 Iron deficiency anemia, unspecified: Secondary | ICD-10-CM | POA: Diagnosis not present

## 2018-09-08 DIAGNOSIS — N2581 Secondary hyperparathyroidism of renal origin: Secondary | ICD-10-CM | POA: Diagnosis not present

## 2018-09-08 DIAGNOSIS — Z992 Dependence on renal dialysis: Secondary | ICD-10-CM | POA: Diagnosis not present

## 2018-09-09 DIAGNOSIS — D509 Iron deficiency anemia, unspecified: Secondary | ICD-10-CM | POA: Diagnosis not present

## 2018-09-09 DIAGNOSIS — N186 End stage renal disease: Secondary | ICD-10-CM | POA: Diagnosis not present

## 2018-09-09 DIAGNOSIS — Z992 Dependence on renal dialysis: Secondary | ICD-10-CM | POA: Diagnosis not present

## 2018-09-09 DIAGNOSIS — N2581 Secondary hyperparathyroidism of renal origin: Secondary | ICD-10-CM | POA: Diagnosis not present

## 2018-09-10 DIAGNOSIS — N2581 Secondary hyperparathyroidism of renal origin: Secondary | ICD-10-CM | POA: Diagnosis not present

## 2018-09-10 DIAGNOSIS — D509 Iron deficiency anemia, unspecified: Secondary | ICD-10-CM | POA: Diagnosis not present

## 2018-09-10 DIAGNOSIS — N186 End stage renal disease: Secondary | ICD-10-CM | POA: Diagnosis not present

## 2018-09-10 DIAGNOSIS — Z992 Dependence on renal dialysis: Secondary | ICD-10-CM | POA: Diagnosis not present

## 2018-09-11 DIAGNOSIS — N186 End stage renal disease: Secondary | ICD-10-CM | POA: Diagnosis not present

## 2018-09-11 DIAGNOSIS — D509 Iron deficiency anemia, unspecified: Secondary | ICD-10-CM | POA: Diagnosis not present

## 2018-09-11 DIAGNOSIS — N2581 Secondary hyperparathyroidism of renal origin: Secondary | ICD-10-CM | POA: Diagnosis not present

## 2018-09-11 DIAGNOSIS — Z992 Dependence on renal dialysis: Secondary | ICD-10-CM | POA: Diagnosis not present

## 2018-09-12 DIAGNOSIS — N2581 Secondary hyperparathyroidism of renal origin: Secondary | ICD-10-CM | POA: Diagnosis not present

## 2018-09-12 DIAGNOSIS — N186 End stage renal disease: Secondary | ICD-10-CM | POA: Diagnosis not present

## 2018-09-12 DIAGNOSIS — D509 Iron deficiency anemia, unspecified: Secondary | ICD-10-CM | POA: Diagnosis not present

## 2018-09-12 DIAGNOSIS — Z992 Dependence on renal dialysis: Secondary | ICD-10-CM | POA: Diagnosis not present

## 2018-09-13 DIAGNOSIS — N186 End stage renal disease: Secondary | ICD-10-CM | POA: Diagnosis not present

## 2018-09-13 DIAGNOSIS — N2581 Secondary hyperparathyroidism of renal origin: Secondary | ICD-10-CM | POA: Diagnosis not present

## 2018-09-13 DIAGNOSIS — D509 Iron deficiency anemia, unspecified: Secondary | ICD-10-CM | POA: Diagnosis not present

## 2018-09-13 DIAGNOSIS — Z992 Dependence on renal dialysis: Secondary | ICD-10-CM | POA: Diagnosis not present

## 2018-09-14 DIAGNOSIS — N186 End stage renal disease: Secondary | ICD-10-CM | POA: Diagnosis not present

## 2018-09-14 DIAGNOSIS — Z992 Dependence on renal dialysis: Secondary | ICD-10-CM | POA: Diagnosis not present

## 2018-09-14 DIAGNOSIS — D509 Iron deficiency anemia, unspecified: Secondary | ICD-10-CM | POA: Diagnosis not present

## 2018-09-14 DIAGNOSIS — N2581 Secondary hyperparathyroidism of renal origin: Secondary | ICD-10-CM | POA: Diagnosis not present

## 2018-09-15 DIAGNOSIS — Z992 Dependence on renal dialysis: Secondary | ICD-10-CM | POA: Diagnosis not present

## 2018-09-15 DIAGNOSIS — N186 End stage renal disease: Secondary | ICD-10-CM | POA: Diagnosis not present

## 2018-09-15 DIAGNOSIS — N2581 Secondary hyperparathyroidism of renal origin: Secondary | ICD-10-CM | POA: Diagnosis not present

## 2018-09-15 DIAGNOSIS — D509 Iron deficiency anemia, unspecified: Secondary | ICD-10-CM | POA: Diagnosis not present

## 2018-09-16 DIAGNOSIS — I10 Essential (primary) hypertension: Secondary | ICD-10-CM | POA: Diagnosis not present

## 2018-09-16 DIAGNOSIS — N401 Enlarged prostate with lower urinary tract symptoms: Secondary | ICD-10-CM | POA: Diagnosis not present

## 2018-09-16 DIAGNOSIS — N2581 Secondary hyperparathyroidism of renal origin: Secondary | ICD-10-CM | POA: Diagnosis not present

## 2018-09-16 DIAGNOSIS — N186 End stage renal disease: Secondary | ICD-10-CM | POA: Diagnosis not present

## 2018-09-16 DIAGNOSIS — N185 Chronic kidney disease, stage 5: Secondary | ICD-10-CM | POA: Diagnosis not present

## 2018-09-16 DIAGNOSIS — K210 Gastro-esophageal reflux disease with esophagitis: Secondary | ICD-10-CM | POA: Diagnosis not present

## 2018-09-16 DIAGNOSIS — D508 Other iron deficiency anemias: Secondary | ICD-10-CM | POA: Diagnosis not present

## 2018-09-16 DIAGNOSIS — D509 Iron deficiency anemia, unspecified: Secondary | ICD-10-CM | POA: Diagnosis not present

## 2018-09-16 DIAGNOSIS — N3091 Cystitis, unspecified with hematuria: Secondary | ICD-10-CM | POA: Diagnosis not present

## 2018-09-16 DIAGNOSIS — Z992 Dependence on renal dialysis: Secondary | ICD-10-CM | POA: Diagnosis not present

## 2018-09-17 DIAGNOSIS — D509 Iron deficiency anemia, unspecified: Secondary | ICD-10-CM | POA: Diagnosis not present

## 2018-09-17 DIAGNOSIS — N2581 Secondary hyperparathyroidism of renal origin: Secondary | ICD-10-CM | POA: Diagnosis not present

## 2018-09-17 DIAGNOSIS — Z992 Dependence on renal dialysis: Secondary | ICD-10-CM | POA: Diagnosis not present

## 2018-09-17 DIAGNOSIS — N186 End stage renal disease: Secondary | ICD-10-CM | POA: Diagnosis not present

## 2018-09-18 DIAGNOSIS — N2581 Secondary hyperparathyroidism of renal origin: Secondary | ICD-10-CM | POA: Diagnosis not present

## 2018-09-18 DIAGNOSIS — N186 End stage renal disease: Secondary | ICD-10-CM | POA: Diagnosis not present

## 2018-09-18 DIAGNOSIS — D509 Iron deficiency anemia, unspecified: Secondary | ICD-10-CM | POA: Diagnosis not present

## 2018-09-18 DIAGNOSIS — Z992 Dependence on renal dialysis: Secondary | ICD-10-CM | POA: Diagnosis not present

## 2018-09-19 DIAGNOSIS — N2581 Secondary hyperparathyroidism of renal origin: Secondary | ICD-10-CM | POA: Diagnosis not present

## 2018-09-19 DIAGNOSIS — Z992 Dependence on renal dialysis: Secondary | ICD-10-CM | POA: Diagnosis not present

## 2018-09-19 DIAGNOSIS — N186 End stage renal disease: Secondary | ICD-10-CM | POA: Diagnosis not present

## 2018-09-19 DIAGNOSIS — D509 Iron deficiency anemia, unspecified: Secondary | ICD-10-CM | POA: Diagnosis not present

## 2018-09-20 DIAGNOSIS — N2581 Secondary hyperparathyroidism of renal origin: Secondary | ICD-10-CM | POA: Diagnosis not present

## 2018-09-20 DIAGNOSIS — D509 Iron deficiency anemia, unspecified: Secondary | ICD-10-CM | POA: Diagnosis not present

## 2018-09-20 DIAGNOSIS — Z992 Dependence on renal dialysis: Secondary | ICD-10-CM | POA: Diagnosis not present

## 2018-09-20 DIAGNOSIS — N186 End stage renal disease: Secondary | ICD-10-CM | POA: Diagnosis not present

## 2018-09-21 DIAGNOSIS — N2581 Secondary hyperparathyroidism of renal origin: Secondary | ICD-10-CM | POA: Diagnosis not present

## 2018-09-21 DIAGNOSIS — D509 Iron deficiency anemia, unspecified: Secondary | ICD-10-CM | POA: Diagnosis not present

## 2018-09-21 DIAGNOSIS — N186 End stage renal disease: Secondary | ICD-10-CM | POA: Diagnosis not present

## 2018-09-21 DIAGNOSIS — Z992 Dependence on renal dialysis: Secondary | ICD-10-CM | POA: Diagnosis not present

## 2018-09-22 DIAGNOSIS — N186 End stage renal disease: Secondary | ICD-10-CM | POA: Diagnosis not present

## 2018-09-22 DIAGNOSIS — Z992 Dependence on renal dialysis: Secondary | ICD-10-CM | POA: Diagnosis not present

## 2018-09-22 DIAGNOSIS — D509 Iron deficiency anemia, unspecified: Secondary | ICD-10-CM | POA: Diagnosis not present

## 2018-09-22 DIAGNOSIS — N2581 Secondary hyperparathyroidism of renal origin: Secondary | ICD-10-CM | POA: Diagnosis not present

## 2018-09-23 DIAGNOSIS — N2581 Secondary hyperparathyroidism of renal origin: Secondary | ICD-10-CM | POA: Diagnosis not present

## 2018-09-23 DIAGNOSIS — D509 Iron deficiency anemia, unspecified: Secondary | ICD-10-CM | POA: Diagnosis not present

## 2018-09-23 DIAGNOSIS — Z992 Dependence on renal dialysis: Secondary | ICD-10-CM | POA: Diagnosis not present

## 2018-09-23 DIAGNOSIS — N186 End stage renal disease: Secondary | ICD-10-CM | POA: Diagnosis not present

## 2018-09-24 DIAGNOSIS — Z992 Dependence on renal dialysis: Secondary | ICD-10-CM | POA: Diagnosis not present

## 2018-09-24 DIAGNOSIS — D509 Iron deficiency anemia, unspecified: Secondary | ICD-10-CM | POA: Diagnosis not present

## 2018-09-24 DIAGNOSIS — N2581 Secondary hyperparathyroidism of renal origin: Secondary | ICD-10-CM | POA: Diagnosis not present

## 2018-09-24 DIAGNOSIS — N186 End stage renal disease: Secondary | ICD-10-CM | POA: Diagnosis not present

## 2018-09-25 DIAGNOSIS — D509 Iron deficiency anemia, unspecified: Secondary | ICD-10-CM | POA: Diagnosis not present

## 2018-09-25 DIAGNOSIS — N2581 Secondary hyperparathyroidism of renal origin: Secondary | ICD-10-CM | POA: Diagnosis not present

## 2018-09-25 DIAGNOSIS — Z992 Dependence on renal dialysis: Secondary | ICD-10-CM | POA: Diagnosis not present

## 2018-09-25 DIAGNOSIS — N186 End stage renal disease: Secondary | ICD-10-CM | POA: Diagnosis not present

## 2018-09-26 DIAGNOSIS — Z992 Dependence on renal dialysis: Secondary | ICD-10-CM | POA: Diagnosis not present

## 2018-09-26 DIAGNOSIS — D509 Iron deficiency anemia, unspecified: Secondary | ICD-10-CM | POA: Diagnosis not present

## 2018-09-26 DIAGNOSIS — N2581 Secondary hyperparathyroidism of renal origin: Secondary | ICD-10-CM | POA: Diagnosis not present

## 2018-09-26 DIAGNOSIS — N186 End stage renal disease: Secondary | ICD-10-CM | POA: Diagnosis not present

## 2018-09-27 DIAGNOSIS — N186 End stage renal disease: Secondary | ICD-10-CM | POA: Diagnosis not present

## 2018-09-27 DIAGNOSIS — N2581 Secondary hyperparathyroidism of renal origin: Secondary | ICD-10-CM | POA: Diagnosis not present

## 2018-09-27 DIAGNOSIS — Z992 Dependence on renal dialysis: Secondary | ICD-10-CM | POA: Diagnosis not present

## 2018-09-27 DIAGNOSIS — D509 Iron deficiency anemia, unspecified: Secondary | ICD-10-CM | POA: Diagnosis not present

## 2018-09-28 DIAGNOSIS — Z992 Dependence on renal dialysis: Secondary | ICD-10-CM | POA: Diagnosis not present

## 2018-09-28 DIAGNOSIS — D509 Iron deficiency anemia, unspecified: Secondary | ICD-10-CM | POA: Diagnosis not present

## 2018-09-28 DIAGNOSIS — N2581 Secondary hyperparathyroidism of renal origin: Secondary | ICD-10-CM | POA: Diagnosis not present

## 2018-09-28 DIAGNOSIS — N186 End stage renal disease: Secondary | ICD-10-CM | POA: Diagnosis not present

## 2018-09-29 DIAGNOSIS — N186 End stage renal disease: Secondary | ICD-10-CM | POA: Diagnosis not present

## 2018-09-29 DIAGNOSIS — D509 Iron deficiency anemia, unspecified: Secondary | ICD-10-CM | POA: Diagnosis not present

## 2018-09-29 DIAGNOSIS — Z992 Dependence on renal dialysis: Secondary | ICD-10-CM | POA: Diagnosis not present

## 2018-09-29 DIAGNOSIS — N2581 Secondary hyperparathyroidism of renal origin: Secondary | ICD-10-CM | POA: Diagnosis not present

## 2018-09-30 DIAGNOSIS — N186 End stage renal disease: Secondary | ICD-10-CM | POA: Diagnosis not present

## 2018-09-30 DIAGNOSIS — Z992 Dependence on renal dialysis: Secondary | ICD-10-CM | POA: Diagnosis not present

## 2018-09-30 DIAGNOSIS — D509 Iron deficiency anemia, unspecified: Secondary | ICD-10-CM | POA: Diagnosis not present

## 2018-09-30 DIAGNOSIS — N2581 Secondary hyperparathyroidism of renal origin: Secondary | ICD-10-CM | POA: Diagnosis not present

## 2018-10-01 DIAGNOSIS — D509 Iron deficiency anemia, unspecified: Secondary | ICD-10-CM | POA: Diagnosis not present

## 2018-10-01 DIAGNOSIS — N2581 Secondary hyperparathyroidism of renal origin: Secondary | ICD-10-CM | POA: Diagnosis not present

## 2018-10-01 DIAGNOSIS — Z992 Dependence on renal dialysis: Secondary | ICD-10-CM | POA: Diagnosis not present

## 2018-10-01 DIAGNOSIS — N186 End stage renal disease: Secondary | ICD-10-CM | POA: Diagnosis not present

## 2018-10-02 DIAGNOSIS — N186 End stage renal disease: Secondary | ICD-10-CM | POA: Diagnosis not present

## 2018-10-02 DIAGNOSIS — Z992 Dependence on renal dialysis: Secondary | ICD-10-CM | POA: Diagnosis not present

## 2018-10-02 DIAGNOSIS — N2581 Secondary hyperparathyroidism of renal origin: Secondary | ICD-10-CM | POA: Diagnosis not present

## 2018-10-02 DIAGNOSIS — D509 Iron deficiency anemia, unspecified: Secondary | ICD-10-CM | POA: Diagnosis not present

## 2018-10-03 DIAGNOSIS — D509 Iron deficiency anemia, unspecified: Secondary | ICD-10-CM | POA: Diagnosis not present

## 2018-10-03 DIAGNOSIS — N186 End stage renal disease: Secondary | ICD-10-CM | POA: Diagnosis not present

## 2018-10-03 DIAGNOSIS — N2581 Secondary hyperparathyroidism of renal origin: Secondary | ICD-10-CM | POA: Diagnosis not present

## 2018-10-03 DIAGNOSIS — Z992 Dependence on renal dialysis: Secondary | ICD-10-CM | POA: Diagnosis not present

## 2018-10-04 DIAGNOSIS — Z992 Dependence on renal dialysis: Secondary | ICD-10-CM | POA: Diagnosis not present

## 2018-10-04 DIAGNOSIS — D509 Iron deficiency anemia, unspecified: Secondary | ICD-10-CM | POA: Diagnosis not present

## 2018-10-04 DIAGNOSIS — N2581 Secondary hyperparathyroidism of renal origin: Secondary | ICD-10-CM | POA: Diagnosis not present

## 2018-10-04 DIAGNOSIS — N186 End stage renal disease: Secondary | ICD-10-CM | POA: Diagnosis not present

## 2018-10-05 DIAGNOSIS — D509 Iron deficiency anemia, unspecified: Secondary | ICD-10-CM | POA: Diagnosis not present

## 2018-10-05 DIAGNOSIS — N186 End stage renal disease: Secondary | ICD-10-CM | POA: Diagnosis not present

## 2018-10-05 DIAGNOSIS — Z992 Dependence on renal dialysis: Secondary | ICD-10-CM | POA: Diagnosis not present

## 2018-10-05 DIAGNOSIS — N2581 Secondary hyperparathyroidism of renal origin: Secondary | ICD-10-CM | POA: Diagnosis not present

## 2018-10-06 DIAGNOSIS — N2581 Secondary hyperparathyroidism of renal origin: Secondary | ICD-10-CM | POA: Diagnosis not present

## 2018-10-06 DIAGNOSIS — D509 Iron deficiency anemia, unspecified: Secondary | ICD-10-CM | POA: Diagnosis not present

## 2018-10-06 DIAGNOSIS — N186 End stage renal disease: Secondary | ICD-10-CM | POA: Diagnosis not present

## 2018-10-06 DIAGNOSIS — Z992 Dependence on renal dialysis: Secondary | ICD-10-CM | POA: Diagnosis not present

## 2018-10-07 DIAGNOSIS — N2581 Secondary hyperparathyroidism of renal origin: Secondary | ICD-10-CM | POA: Diagnosis not present

## 2018-10-07 DIAGNOSIS — D509 Iron deficiency anemia, unspecified: Secondary | ICD-10-CM | POA: Diagnosis not present

## 2018-10-07 DIAGNOSIS — Z992 Dependence on renal dialysis: Secondary | ICD-10-CM | POA: Diagnosis not present

## 2018-10-07 DIAGNOSIS — N186 End stage renal disease: Secondary | ICD-10-CM | POA: Diagnosis not present

## 2018-10-08 DIAGNOSIS — Z992 Dependence on renal dialysis: Secondary | ICD-10-CM | POA: Diagnosis not present

## 2018-10-08 DIAGNOSIS — N186 End stage renal disease: Secondary | ICD-10-CM | POA: Diagnosis not present

## 2018-10-08 DIAGNOSIS — D509 Iron deficiency anemia, unspecified: Secondary | ICD-10-CM | POA: Diagnosis not present

## 2018-10-08 DIAGNOSIS — N2581 Secondary hyperparathyroidism of renal origin: Secondary | ICD-10-CM | POA: Diagnosis not present

## 2018-10-09 DIAGNOSIS — N2581 Secondary hyperparathyroidism of renal origin: Secondary | ICD-10-CM | POA: Diagnosis not present

## 2018-10-09 DIAGNOSIS — Z992 Dependence on renal dialysis: Secondary | ICD-10-CM | POA: Diagnosis not present

## 2018-10-09 DIAGNOSIS — D509 Iron deficiency anemia, unspecified: Secondary | ICD-10-CM | POA: Diagnosis not present

## 2018-10-09 DIAGNOSIS — N186 End stage renal disease: Secondary | ICD-10-CM | POA: Diagnosis not present

## 2018-10-10 DIAGNOSIS — N2581 Secondary hyperparathyroidism of renal origin: Secondary | ICD-10-CM | POA: Diagnosis not present

## 2018-10-10 DIAGNOSIS — N186 End stage renal disease: Secondary | ICD-10-CM | POA: Diagnosis not present

## 2018-10-10 DIAGNOSIS — Z992 Dependence on renal dialysis: Secondary | ICD-10-CM | POA: Diagnosis not present

## 2018-10-10 DIAGNOSIS — D509 Iron deficiency anemia, unspecified: Secondary | ICD-10-CM | POA: Diagnosis not present

## 2018-10-11 DIAGNOSIS — N2581 Secondary hyperparathyroidism of renal origin: Secondary | ICD-10-CM | POA: Diagnosis not present

## 2018-10-11 DIAGNOSIS — N186 End stage renal disease: Secondary | ICD-10-CM | POA: Diagnosis not present

## 2018-10-11 DIAGNOSIS — D509 Iron deficiency anemia, unspecified: Secondary | ICD-10-CM | POA: Diagnosis not present

## 2018-10-11 DIAGNOSIS — Z992 Dependence on renal dialysis: Secondary | ICD-10-CM | POA: Diagnosis not present

## 2018-10-12 DIAGNOSIS — Z992 Dependence on renal dialysis: Secondary | ICD-10-CM | POA: Diagnosis not present

## 2018-10-12 DIAGNOSIS — D509 Iron deficiency anemia, unspecified: Secondary | ICD-10-CM | POA: Diagnosis not present

## 2018-10-12 DIAGNOSIS — N2581 Secondary hyperparathyroidism of renal origin: Secondary | ICD-10-CM | POA: Diagnosis not present

## 2018-10-12 DIAGNOSIS — N186 End stage renal disease: Secondary | ICD-10-CM | POA: Diagnosis not present

## 2018-10-13 DIAGNOSIS — D509 Iron deficiency anemia, unspecified: Secondary | ICD-10-CM | POA: Diagnosis not present

## 2018-10-13 DIAGNOSIS — N186 End stage renal disease: Secondary | ICD-10-CM | POA: Diagnosis not present

## 2018-10-13 DIAGNOSIS — Z992 Dependence on renal dialysis: Secondary | ICD-10-CM | POA: Diagnosis not present

## 2018-10-13 DIAGNOSIS — N2581 Secondary hyperparathyroidism of renal origin: Secondary | ICD-10-CM | POA: Diagnosis not present

## 2018-10-14 DIAGNOSIS — N2581 Secondary hyperparathyroidism of renal origin: Secondary | ICD-10-CM | POA: Diagnosis not present

## 2018-10-14 DIAGNOSIS — Z992 Dependence on renal dialysis: Secondary | ICD-10-CM | POA: Diagnosis not present

## 2018-10-14 DIAGNOSIS — N186 End stage renal disease: Secondary | ICD-10-CM | POA: Diagnosis not present

## 2018-10-14 DIAGNOSIS — D509 Iron deficiency anemia, unspecified: Secondary | ICD-10-CM | POA: Diagnosis not present

## 2018-10-15 DIAGNOSIS — N186 End stage renal disease: Secondary | ICD-10-CM | POA: Diagnosis not present

## 2018-10-15 DIAGNOSIS — Z992 Dependence on renal dialysis: Secondary | ICD-10-CM | POA: Diagnosis not present

## 2018-10-15 DIAGNOSIS — D509 Iron deficiency anemia, unspecified: Secondary | ICD-10-CM | POA: Diagnosis not present

## 2018-10-15 DIAGNOSIS — N2581 Secondary hyperparathyroidism of renal origin: Secondary | ICD-10-CM | POA: Diagnosis not present

## 2018-10-16 DIAGNOSIS — N2581 Secondary hyperparathyroidism of renal origin: Secondary | ICD-10-CM | POA: Diagnosis not present

## 2018-10-16 DIAGNOSIS — N186 End stage renal disease: Secondary | ICD-10-CM | POA: Diagnosis not present

## 2018-10-16 DIAGNOSIS — D509 Iron deficiency anemia, unspecified: Secondary | ICD-10-CM | POA: Diagnosis not present

## 2018-10-16 DIAGNOSIS — Z992 Dependence on renal dialysis: Secondary | ICD-10-CM | POA: Diagnosis not present

## 2018-10-17 DIAGNOSIS — N2581 Secondary hyperparathyroidism of renal origin: Secondary | ICD-10-CM | POA: Diagnosis not present

## 2018-10-17 DIAGNOSIS — D509 Iron deficiency anemia, unspecified: Secondary | ICD-10-CM | POA: Diagnosis not present

## 2018-10-17 DIAGNOSIS — Z992 Dependence on renal dialysis: Secondary | ICD-10-CM | POA: Diagnosis not present

## 2018-10-17 DIAGNOSIS — N186 End stage renal disease: Secondary | ICD-10-CM | POA: Diagnosis not present

## 2018-10-18 DIAGNOSIS — N2581 Secondary hyperparathyroidism of renal origin: Secondary | ICD-10-CM | POA: Diagnosis not present

## 2018-10-18 DIAGNOSIS — N186 End stage renal disease: Secondary | ICD-10-CM | POA: Diagnosis not present

## 2018-10-18 DIAGNOSIS — Z992 Dependence on renal dialysis: Secondary | ICD-10-CM | POA: Diagnosis not present

## 2018-10-18 DIAGNOSIS — D509 Iron deficiency anemia, unspecified: Secondary | ICD-10-CM | POA: Diagnosis not present

## 2018-10-19 DIAGNOSIS — D509 Iron deficiency anemia, unspecified: Secondary | ICD-10-CM | POA: Diagnosis not present

## 2018-10-19 DIAGNOSIS — Z992 Dependence on renal dialysis: Secondary | ICD-10-CM | POA: Diagnosis not present

## 2018-10-19 DIAGNOSIS — N186 End stage renal disease: Secondary | ICD-10-CM | POA: Diagnosis not present

## 2018-10-19 DIAGNOSIS — N2581 Secondary hyperparathyroidism of renal origin: Secondary | ICD-10-CM | POA: Diagnosis not present

## 2018-10-20 DIAGNOSIS — D509 Iron deficiency anemia, unspecified: Secondary | ICD-10-CM | POA: Diagnosis not present

## 2018-10-20 DIAGNOSIS — N186 End stage renal disease: Secondary | ICD-10-CM | POA: Diagnosis not present

## 2018-10-20 DIAGNOSIS — Z992 Dependence on renal dialysis: Secondary | ICD-10-CM | POA: Diagnosis not present

## 2018-10-20 DIAGNOSIS — N2581 Secondary hyperparathyroidism of renal origin: Secondary | ICD-10-CM | POA: Diagnosis not present

## 2018-10-21 DIAGNOSIS — N186 End stage renal disease: Secondary | ICD-10-CM | POA: Diagnosis not present

## 2018-10-21 DIAGNOSIS — D509 Iron deficiency anemia, unspecified: Secondary | ICD-10-CM | POA: Diagnosis not present

## 2018-10-21 DIAGNOSIS — N2581 Secondary hyperparathyroidism of renal origin: Secondary | ICD-10-CM | POA: Diagnosis not present

## 2018-10-21 DIAGNOSIS — Z992 Dependence on renal dialysis: Secondary | ICD-10-CM | POA: Diagnosis not present

## 2018-10-22 DIAGNOSIS — Z992 Dependence on renal dialysis: Secondary | ICD-10-CM | POA: Diagnosis not present

## 2018-10-22 DIAGNOSIS — N186 End stage renal disease: Secondary | ICD-10-CM | POA: Diagnosis not present

## 2018-10-22 DIAGNOSIS — N2581 Secondary hyperparathyroidism of renal origin: Secondary | ICD-10-CM | POA: Diagnosis not present

## 2018-10-22 DIAGNOSIS — D509 Iron deficiency anemia, unspecified: Secondary | ICD-10-CM | POA: Diagnosis not present

## 2018-10-23 DIAGNOSIS — N2581 Secondary hyperparathyroidism of renal origin: Secondary | ICD-10-CM | POA: Diagnosis not present

## 2018-10-23 DIAGNOSIS — N186 End stage renal disease: Secondary | ICD-10-CM | POA: Diagnosis not present

## 2018-10-23 DIAGNOSIS — D509 Iron deficiency anemia, unspecified: Secondary | ICD-10-CM | POA: Diagnosis not present

## 2018-10-23 DIAGNOSIS — Z992 Dependence on renal dialysis: Secondary | ICD-10-CM | POA: Diagnosis not present

## 2018-10-24 DIAGNOSIS — Z992 Dependence on renal dialysis: Secondary | ICD-10-CM | POA: Diagnosis not present

## 2018-10-24 DIAGNOSIS — N186 End stage renal disease: Secondary | ICD-10-CM | POA: Diagnosis not present

## 2018-10-24 DIAGNOSIS — D509 Iron deficiency anemia, unspecified: Secondary | ICD-10-CM | POA: Diagnosis not present

## 2018-10-24 DIAGNOSIS — N2581 Secondary hyperparathyroidism of renal origin: Secondary | ICD-10-CM | POA: Diagnosis not present

## 2018-10-25 DIAGNOSIS — N186 End stage renal disease: Secondary | ICD-10-CM | POA: Diagnosis not present

## 2018-10-25 DIAGNOSIS — N2581 Secondary hyperparathyroidism of renal origin: Secondary | ICD-10-CM | POA: Diagnosis not present

## 2018-10-25 DIAGNOSIS — D509 Iron deficiency anemia, unspecified: Secondary | ICD-10-CM | POA: Diagnosis not present

## 2018-10-25 DIAGNOSIS — Z992 Dependence on renal dialysis: Secondary | ICD-10-CM | POA: Diagnosis not present

## 2018-10-26 DIAGNOSIS — Z992 Dependence on renal dialysis: Secondary | ICD-10-CM | POA: Diagnosis not present

## 2018-10-26 DIAGNOSIS — N186 End stage renal disease: Secondary | ICD-10-CM | POA: Diagnosis not present

## 2018-10-26 DIAGNOSIS — D509 Iron deficiency anemia, unspecified: Secondary | ICD-10-CM | POA: Diagnosis not present

## 2018-10-26 DIAGNOSIS — N2581 Secondary hyperparathyroidism of renal origin: Secondary | ICD-10-CM | POA: Diagnosis not present

## 2018-10-27 DIAGNOSIS — D509 Iron deficiency anemia, unspecified: Secondary | ICD-10-CM | POA: Diagnosis not present

## 2018-10-27 DIAGNOSIS — N2581 Secondary hyperparathyroidism of renal origin: Secondary | ICD-10-CM | POA: Diagnosis not present

## 2018-10-27 DIAGNOSIS — N186 End stage renal disease: Secondary | ICD-10-CM | POA: Diagnosis not present

## 2018-10-27 DIAGNOSIS — Z992 Dependence on renal dialysis: Secondary | ICD-10-CM | POA: Diagnosis not present

## 2018-10-28 DIAGNOSIS — N186 End stage renal disease: Secondary | ICD-10-CM | POA: Diagnosis not present

## 2018-10-28 DIAGNOSIS — N2581 Secondary hyperparathyroidism of renal origin: Secondary | ICD-10-CM | POA: Diagnosis not present

## 2018-10-28 DIAGNOSIS — Z992 Dependence on renal dialysis: Secondary | ICD-10-CM | POA: Diagnosis not present

## 2018-10-28 DIAGNOSIS — D509 Iron deficiency anemia, unspecified: Secondary | ICD-10-CM | POA: Diagnosis not present

## 2018-10-29 DIAGNOSIS — N2581 Secondary hyperparathyroidism of renal origin: Secondary | ICD-10-CM | POA: Diagnosis not present

## 2018-10-29 DIAGNOSIS — N186 End stage renal disease: Secondary | ICD-10-CM | POA: Diagnosis not present

## 2018-10-29 DIAGNOSIS — Z992 Dependence on renal dialysis: Secondary | ICD-10-CM | POA: Diagnosis not present

## 2018-10-29 DIAGNOSIS — D509 Iron deficiency anemia, unspecified: Secondary | ICD-10-CM | POA: Diagnosis not present

## 2018-10-30 DIAGNOSIS — D509 Iron deficiency anemia, unspecified: Secondary | ICD-10-CM | POA: Diagnosis not present

## 2018-10-30 DIAGNOSIS — N186 End stage renal disease: Secondary | ICD-10-CM | POA: Diagnosis not present

## 2018-10-30 DIAGNOSIS — N2581 Secondary hyperparathyroidism of renal origin: Secondary | ICD-10-CM | POA: Diagnosis not present

## 2018-10-30 DIAGNOSIS — Z992 Dependence on renal dialysis: Secondary | ICD-10-CM | POA: Diagnosis not present

## 2018-10-31 DIAGNOSIS — N186 End stage renal disease: Secondary | ICD-10-CM | POA: Diagnosis not present

## 2018-10-31 DIAGNOSIS — N2581 Secondary hyperparathyroidism of renal origin: Secondary | ICD-10-CM | POA: Diagnosis not present

## 2018-10-31 DIAGNOSIS — Z992 Dependence on renal dialysis: Secondary | ICD-10-CM | POA: Diagnosis not present

## 2018-10-31 DIAGNOSIS — D509 Iron deficiency anemia, unspecified: Secondary | ICD-10-CM | POA: Diagnosis not present

## 2018-11-01 DIAGNOSIS — D509 Iron deficiency anemia, unspecified: Secondary | ICD-10-CM | POA: Diagnosis not present

## 2018-11-01 DIAGNOSIS — Z992 Dependence on renal dialysis: Secondary | ICD-10-CM | POA: Diagnosis not present

## 2018-11-01 DIAGNOSIS — N186 End stage renal disease: Secondary | ICD-10-CM | POA: Diagnosis not present

## 2018-11-01 DIAGNOSIS — N2581 Secondary hyperparathyroidism of renal origin: Secondary | ICD-10-CM | POA: Diagnosis not present

## 2018-11-02 DIAGNOSIS — N186 End stage renal disease: Secondary | ICD-10-CM | POA: Diagnosis not present

## 2018-11-02 DIAGNOSIS — N2581 Secondary hyperparathyroidism of renal origin: Secondary | ICD-10-CM | POA: Diagnosis not present

## 2018-11-02 DIAGNOSIS — D509 Iron deficiency anemia, unspecified: Secondary | ICD-10-CM | POA: Diagnosis not present

## 2018-11-02 DIAGNOSIS — Z992 Dependence on renal dialysis: Secondary | ICD-10-CM | POA: Diagnosis not present

## 2018-11-03 DIAGNOSIS — N2581 Secondary hyperparathyroidism of renal origin: Secondary | ICD-10-CM | POA: Diagnosis not present

## 2018-11-03 DIAGNOSIS — D509 Iron deficiency anemia, unspecified: Secondary | ICD-10-CM | POA: Diagnosis not present

## 2018-11-03 DIAGNOSIS — Z992 Dependence on renal dialysis: Secondary | ICD-10-CM | POA: Diagnosis not present

## 2018-11-03 DIAGNOSIS — N186 End stage renal disease: Secondary | ICD-10-CM | POA: Diagnosis not present

## 2018-11-04 DIAGNOSIS — Z992 Dependence on renal dialysis: Secondary | ICD-10-CM | POA: Diagnosis not present

## 2018-11-04 DIAGNOSIS — N2581 Secondary hyperparathyroidism of renal origin: Secondary | ICD-10-CM | POA: Diagnosis not present

## 2018-11-04 DIAGNOSIS — D509 Iron deficiency anemia, unspecified: Secondary | ICD-10-CM | POA: Diagnosis not present

## 2018-11-04 DIAGNOSIS — N186 End stage renal disease: Secondary | ICD-10-CM | POA: Diagnosis not present

## 2018-11-05 DIAGNOSIS — N186 End stage renal disease: Secondary | ICD-10-CM | POA: Diagnosis not present

## 2018-11-05 DIAGNOSIS — N2581 Secondary hyperparathyroidism of renal origin: Secondary | ICD-10-CM | POA: Diagnosis not present

## 2018-11-05 DIAGNOSIS — Z992 Dependence on renal dialysis: Secondary | ICD-10-CM | POA: Diagnosis not present

## 2018-11-05 DIAGNOSIS — D509 Iron deficiency anemia, unspecified: Secondary | ICD-10-CM | POA: Diagnosis not present

## 2018-11-06 DIAGNOSIS — N186 End stage renal disease: Secondary | ICD-10-CM | POA: Diagnosis not present

## 2018-11-06 DIAGNOSIS — D509 Iron deficiency anemia, unspecified: Secondary | ICD-10-CM | POA: Diagnosis not present

## 2018-11-06 DIAGNOSIS — Z992 Dependence on renal dialysis: Secondary | ICD-10-CM | POA: Diagnosis not present

## 2018-11-06 DIAGNOSIS — N2581 Secondary hyperparathyroidism of renal origin: Secondary | ICD-10-CM | POA: Diagnosis not present

## 2018-11-07 DIAGNOSIS — Z992 Dependence on renal dialysis: Secondary | ICD-10-CM | POA: Diagnosis not present

## 2018-11-07 DIAGNOSIS — D509 Iron deficiency anemia, unspecified: Secondary | ICD-10-CM | POA: Diagnosis not present

## 2018-11-07 DIAGNOSIS — N2581 Secondary hyperparathyroidism of renal origin: Secondary | ICD-10-CM | POA: Diagnosis not present

## 2018-11-07 DIAGNOSIS — N186 End stage renal disease: Secondary | ICD-10-CM | POA: Diagnosis not present

## 2018-11-08 DIAGNOSIS — Z992 Dependence on renal dialysis: Secondary | ICD-10-CM | POA: Diagnosis not present

## 2018-11-08 DIAGNOSIS — N2581 Secondary hyperparathyroidism of renal origin: Secondary | ICD-10-CM | POA: Diagnosis not present

## 2018-11-08 DIAGNOSIS — N186 End stage renal disease: Secondary | ICD-10-CM | POA: Diagnosis not present

## 2018-11-08 DIAGNOSIS — D509 Iron deficiency anemia, unspecified: Secondary | ICD-10-CM | POA: Diagnosis not present

## 2018-11-09 DIAGNOSIS — N2581 Secondary hyperparathyroidism of renal origin: Secondary | ICD-10-CM | POA: Diagnosis not present

## 2018-11-09 DIAGNOSIS — Z992 Dependence on renal dialysis: Secondary | ICD-10-CM | POA: Diagnosis not present

## 2018-11-09 DIAGNOSIS — N186 End stage renal disease: Secondary | ICD-10-CM | POA: Diagnosis not present

## 2018-11-09 DIAGNOSIS — D509 Iron deficiency anemia, unspecified: Secondary | ICD-10-CM | POA: Diagnosis not present

## 2018-11-10 DIAGNOSIS — Z992 Dependence on renal dialysis: Secondary | ICD-10-CM | POA: Diagnosis not present

## 2018-11-10 DIAGNOSIS — D509 Iron deficiency anemia, unspecified: Secondary | ICD-10-CM | POA: Diagnosis not present

## 2018-11-10 DIAGNOSIS — N186 End stage renal disease: Secondary | ICD-10-CM | POA: Diagnosis not present

## 2018-11-10 DIAGNOSIS — N2581 Secondary hyperparathyroidism of renal origin: Secondary | ICD-10-CM | POA: Diagnosis not present

## 2018-11-11 DIAGNOSIS — N2581 Secondary hyperparathyroidism of renal origin: Secondary | ICD-10-CM | POA: Diagnosis not present

## 2018-11-11 DIAGNOSIS — D509 Iron deficiency anemia, unspecified: Secondary | ICD-10-CM | POA: Diagnosis not present

## 2018-11-11 DIAGNOSIS — N186 End stage renal disease: Secondary | ICD-10-CM | POA: Diagnosis not present

## 2018-11-11 DIAGNOSIS — Z992 Dependence on renal dialysis: Secondary | ICD-10-CM | POA: Diagnosis not present

## 2018-11-12 DIAGNOSIS — N186 End stage renal disease: Secondary | ICD-10-CM | POA: Diagnosis not present

## 2018-11-12 DIAGNOSIS — Z992 Dependence on renal dialysis: Secondary | ICD-10-CM | POA: Diagnosis not present

## 2018-11-12 DIAGNOSIS — N2581 Secondary hyperparathyroidism of renal origin: Secondary | ICD-10-CM | POA: Diagnosis not present

## 2018-11-12 DIAGNOSIS — D509 Iron deficiency anemia, unspecified: Secondary | ICD-10-CM | POA: Diagnosis not present

## 2018-11-13 DIAGNOSIS — N186 End stage renal disease: Secondary | ICD-10-CM | POA: Diagnosis not present

## 2018-11-13 DIAGNOSIS — Z992 Dependence on renal dialysis: Secondary | ICD-10-CM | POA: Diagnosis not present

## 2018-11-13 DIAGNOSIS — D509 Iron deficiency anemia, unspecified: Secondary | ICD-10-CM | POA: Diagnosis not present

## 2018-11-13 DIAGNOSIS — N2581 Secondary hyperparathyroidism of renal origin: Secondary | ICD-10-CM | POA: Diagnosis not present

## 2018-11-14 DIAGNOSIS — Z992 Dependence on renal dialysis: Secondary | ICD-10-CM | POA: Diagnosis not present

## 2018-11-14 DIAGNOSIS — N2581 Secondary hyperparathyroidism of renal origin: Secondary | ICD-10-CM | POA: Diagnosis not present

## 2018-11-14 DIAGNOSIS — D509 Iron deficiency anemia, unspecified: Secondary | ICD-10-CM | POA: Diagnosis not present

## 2018-11-14 DIAGNOSIS — N186 End stage renal disease: Secondary | ICD-10-CM | POA: Diagnosis not present

## 2018-11-15 DIAGNOSIS — D509 Iron deficiency anemia, unspecified: Secondary | ICD-10-CM | POA: Diagnosis not present

## 2018-11-15 DIAGNOSIS — N186 End stage renal disease: Secondary | ICD-10-CM | POA: Diagnosis not present

## 2018-11-15 DIAGNOSIS — N2581 Secondary hyperparathyroidism of renal origin: Secondary | ICD-10-CM | POA: Diagnosis not present

## 2018-11-15 DIAGNOSIS — Z992 Dependence on renal dialysis: Secondary | ICD-10-CM | POA: Diagnosis not present

## 2018-11-16 DIAGNOSIS — N186 End stage renal disease: Secondary | ICD-10-CM | POA: Diagnosis not present

## 2018-11-16 DIAGNOSIS — N2581 Secondary hyperparathyroidism of renal origin: Secondary | ICD-10-CM | POA: Diagnosis not present

## 2018-11-16 DIAGNOSIS — D509 Iron deficiency anemia, unspecified: Secondary | ICD-10-CM | POA: Diagnosis not present

## 2018-11-16 DIAGNOSIS — Z992 Dependence on renal dialysis: Secondary | ICD-10-CM | POA: Diagnosis not present

## 2018-11-17 DIAGNOSIS — D509 Iron deficiency anemia, unspecified: Secondary | ICD-10-CM | POA: Diagnosis not present

## 2018-11-17 DIAGNOSIS — N2581 Secondary hyperparathyroidism of renal origin: Secondary | ICD-10-CM | POA: Diagnosis not present

## 2018-11-17 DIAGNOSIS — N186 End stage renal disease: Secondary | ICD-10-CM | POA: Diagnosis not present

## 2018-11-17 DIAGNOSIS — Z992 Dependence on renal dialysis: Secondary | ICD-10-CM | POA: Diagnosis not present

## 2018-11-18 DIAGNOSIS — Z992 Dependence on renal dialysis: Secondary | ICD-10-CM | POA: Diagnosis not present

## 2018-11-18 DIAGNOSIS — N186 End stage renal disease: Secondary | ICD-10-CM | POA: Diagnosis not present

## 2018-11-18 DIAGNOSIS — D509 Iron deficiency anemia, unspecified: Secondary | ICD-10-CM | POA: Diagnosis not present

## 2018-11-18 DIAGNOSIS — N2581 Secondary hyperparathyroidism of renal origin: Secondary | ICD-10-CM | POA: Diagnosis not present

## 2018-11-19 DIAGNOSIS — N186 End stage renal disease: Secondary | ICD-10-CM | POA: Diagnosis not present

## 2018-11-19 DIAGNOSIS — N2581 Secondary hyperparathyroidism of renal origin: Secondary | ICD-10-CM | POA: Diagnosis not present

## 2018-11-19 DIAGNOSIS — Z992 Dependence on renal dialysis: Secondary | ICD-10-CM | POA: Diagnosis not present

## 2018-11-19 DIAGNOSIS — D509 Iron deficiency anemia, unspecified: Secondary | ICD-10-CM | POA: Diagnosis not present

## 2018-11-20 DIAGNOSIS — N2581 Secondary hyperparathyroidism of renal origin: Secondary | ICD-10-CM | POA: Diagnosis not present

## 2018-11-20 DIAGNOSIS — Z992 Dependence on renal dialysis: Secondary | ICD-10-CM | POA: Diagnosis not present

## 2018-11-20 DIAGNOSIS — N186 End stage renal disease: Secondary | ICD-10-CM | POA: Diagnosis not present

## 2018-11-20 DIAGNOSIS — D509 Iron deficiency anemia, unspecified: Secondary | ICD-10-CM | POA: Diagnosis not present

## 2018-11-21 DIAGNOSIS — D509 Iron deficiency anemia, unspecified: Secondary | ICD-10-CM | POA: Diagnosis not present

## 2018-11-21 DIAGNOSIS — Z992 Dependence on renal dialysis: Secondary | ICD-10-CM | POA: Diagnosis not present

## 2018-11-21 DIAGNOSIS — N2581 Secondary hyperparathyroidism of renal origin: Secondary | ICD-10-CM | POA: Diagnosis not present

## 2018-11-21 DIAGNOSIS — N186 End stage renal disease: Secondary | ICD-10-CM | POA: Diagnosis not present

## 2018-11-22 DIAGNOSIS — N2581 Secondary hyperparathyroidism of renal origin: Secondary | ICD-10-CM | POA: Diagnosis not present

## 2018-11-22 DIAGNOSIS — Z992 Dependence on renal dialysis: Secondary | ICD-10-CM | POA: Diagnosis not present

## 2018-11-22 DIAGNOSIS — N186 End stage renal disease: Secondary | ICD-10-CM | POA: Diagnosis not present

## 2018-11-22 DIAGNOSIS — D509 Iron deficiency anemia, unspecified: Secondary | ICD-10-CM | POA: Diagnosis not present

## 2018-11-23 DIAGNOSIS — Z992 Dependence on renal dialysis: Secondary | ICD-10-CM | POA: Diagnosis not present

## 2018-11-23 DIAGNOSIS — N186 End stage renal disease: Secondary | ICD-10-CM | POA: Diagnosis not present

## 2018-11-23 DIAGNOSIS — D509 Iron deficiency anemia, unspecified: Secondary | ICD-10-CM | POA: Diagnosis not present

## 2018-11-23 DIAGNOSIS — N2581 Secondary hyperparathyroidism of renal origin: Secondary | ICD-10-CM | POA: Diagnosis not present

## 2018-11-24 DIAGNOSIS — N186 End stage renal disease: Secondary | ICD-10-CM | POA: Diagnosis not present

## 2018-11-24 DIAGNOSIS — Z992 Dependence on renal dialysis: Secondary | ICD-10-CM | POA: Diagnosis not present

## 2018-11-24 DIAGNOSIS — N2581 Secondary hyperparathyroidism of renal origin: Secondary | ICD-10-CM | POA: Diagnosis not present

## 2018-11-24 DIAGNOSIS — D509 Iron deficiency anemia, unspecified: Secondary | ICD-10-CM | POA: Diagnosis not present

## 2018-11-25 DIAGNOSIS — N2581 Secondary hyperparathyroidism of renal origin: Secondary | ICD-10-CM | POA: Diagnosis not present

## 2018-11-25 DIAGNOSIS — D509 Iron deficiency anemia, unspecified: Secondary | ICD-10-CM | POA: Diagnosis not present

## 2018-11-25 DIAGNOSIS — N186 End stage renal disease: Secondary | ICD-10-CM | POA: Diagnosis not present

## 2018-11-25 DIAGNOSIS — Z992 Dependence on renal dialysis: Secondary | ICD-10-CM | POA: Diagnosis not present

## 2018-11-26 DIAGNOSIS — N186 End stage renal disease: Secondary | ICD-10-CM | POA: Diagnosis not present

## 2018-11-26 DIAGNOSIS — N2581 Secondary hyperparathyroidism of renal origin: Secondary | ICD-10-CM | POA: Diagnosis not present

## 2018-11-26 DIAGNOSIS — Z992 Dependence on renal dialysis: Secondary | ICD-10-CM | POA: Diagnosis not present

## 2018-11-26 DIAGNOSIS — D509 Iron deficiency anemia, unspecified: Secondary | ICD-10-CM | POA: Diagnosis not present

## 2018-11-27 DIAGNOSIS — N2581 Secondary hyperparathyroidism of renal origin: Secondary | ICD-10-CM | POA: Diagnosis not present

## 2018-11-27 DIAGNOSIS — D509 Iron deficiency anemia, unspecified: Secondary | ICD-10-CM | POA: Diagnosis not present

## 2018-11-27 DIAGNOSIS — N186 End stage renal disease: Secondary | ICD-10-CM | POA: Diagnosis not present

## 2018-11-27 DIAGNOSIS — Z992 Dependence on renal dialysis: Secondary | ICD-10-CM | POA: Diagnosis not present

## 2018-11-28 DIAGNOSIS — Z992 Dependence on renal dialysis: Secondary | ICD-10-CM | POA: Diagnosis not present

## 2018-11-28 DIAGNOSIS — N2581 Secondary hyperparathyroidism of renal origin: Secondary | ICD-10-CM | POA: Diagnosis not present

## 2018-11-28 DIAGNOSIS — D509 Iron deficiency anemia, unspecified: Secondary | ICD-10-CM | POA: Diagnosis not present

## 2018-11-28 DIAGNOSIS — N186 End stage renal disease: Secondary | ICD-10-CM | POA: Diagnosis not present

## 2018-11-29 DIAGNOSIS — N186 End stage renal disease: Secondary | ICD-10-CM | POA: Diagnosis not present

## 2018-11-29 DIAGNOSIS — Z992 Dependence on renal dialysis: Secondary | ICD-10-CM | POA: Diagnosis not present

## 2018-11-29 DIAGNOSIS — D509 Iron deficiency anemia, unspecified: Secondary | ICD-10-CM | POA: Diagnosis not present

## 2018-11-29 DIAGNOSIS — N2581 Secondary hyperparathyroidism of renal origin: Secondary | ICD-10-CM | POA: Diagnosis not present

## 2018-11-30 DIAGNOSIS — D509 Iron deficiency anemia, unspecified: Secondary | ICD-10-CM | POA: Diagnosis not present

## 2018-11-30 DIAGNOSIS — N2581 Secondary hyperparathyroidism of renal origin: Secondary | ICD-10-CM | POA: Diagnosis not present

## 2018-11-30 DIAGNOSIS — Z992 Dependence on renal dialysis: Secondary | ICD-10-CM | POA: Diagnosis not present

## 2018-11-30 DIAGNOSIS — N186 End stage renal disease: Secondary | ICD-10-CM | POA: Diagnosis not present

## 2018-12-01 DIAGNOSIS — D509 Iron deficiency anemia, unspecified: Secondary | ICD-10-CM | POA: Diagnosis not present

## 2018-12-01 DIAGNOSIS — Z992 Dependence on renal dialysis: Secondary | ICD-10-CM | POA: Diagnosis not present

## 2018-12-01 DIAGNOSIS — N2581 Secondary hyperparathyroidism of renal origin: Secondary | ICD-10-CM | POA: Diagnosis not present

## 2018-12-01 DIAGNOSIS — N186 End stage renal disease: Secondary | ICD-10-CM | POA: Diagnosis not present

## 2018-12-02 DIAGNOSIS — N186 End stage renal disease: Secondary | ICD-10-CM | POA: Diagnosis not present

## 2018-12-02 DIAGNOSIS — N2581 Secondary hyperparathyroidism of renal origin: Secondary | ICD-10-CM | POA: Diagnosis not present

## 2018-12-02 DIAGNOSIS — D509 Iron deficiency anemia, unspecified: Secondary | ICD-10-CM | POA: Diagnosis not present

## 2018-12-02 DIAGNOSIS — Z992 Dependence on renal dialysis: Secondary | ICD-10-CM | POA: Diagnosis not present

## 2018-12-03 DIAGNOSIS — Z992 Dependence on renal dialysis: Secondary | ICD-10-CM | POA: Diagnosis not present

## 2018-12-03 DIAGNOSIS — D509 Iron deficiency anemia, unspecified: Secondary | ICD-10-CM | POA: Diagnosis not present

## 2018-12-03 DIAGNOSIS — N186 End stage renal disease: Secondary | ICD-10-CM | POA: Diagnosis not present

## 2018-12-03 DIAGNOSIS — N2581 Secondary hyperparathyroidism of renal origin: Secondary | ICD-10-CM | POA: Diagnosis not present

## 2018-12-04 DIAGNOSIS — N186 End stage renal disease: Secondary | ICD-10-CM | POA: Diagnosis not present

## 2018-12-04 DIAGNOSIS — Z992 Dependence on renal dialysis: Secondary | ICD-10-CM | POA: Diagnosis not present

## 2018-12-04 DIAGNOSIS — D509 Iron deficiency anemia, unspecified: Secondary | ICD-10-CM | POA: Diagnosis not present

## 2018-12-04 DIAGNOSIS — N2581 Secondary hyperparathyroidism of renal origin: Secondary | ICD-10-CM | POA: Diagnosis not present

## 2018-12-05 DIAGNOSIS — N2581 Secondary hyperparathyroidism of renal origin: Secondary | ICD-10-CM | POA: Diagnosis not present

## 2018-12-05 DIAGNOSIS — Z992 Dependence on renal dialysis: Secondary | ICD-10-CM | POA: Diagnosis not present

## 2018-12-05 DIAGNOSIS — D509 Iron deficiency anemia, unspecified: Secondary | ICD-10-CM | POA: Diagnosis not present

## 2018-12-05 DIAGNOSIS — N186 End stage renal disease: Secondary | ICD-10-CM | POA: Diagnosis not present

## 2018-12-06 DIAGNOSIS — Z992 Dependence on renal dialysis: Secondary | ICD-10-CM | POA: Diagnosis not present

## 2018-12-06 DIAGNOSIS — N186 End stage renal disease: Secondary | ICD-10-CM | POA: Diagnosis not present

## 2018-12-06 DIAGNOSIS — D509 Iron deficiency anemia, unspecified: Secondary | ICD-10-CM | POA: Diagnosis not present

## 2018-12-06 DIAGNOSIS — N2581 Secondary hyperparathyroidism of renal origin: Secondary | ICD-10-CM | POA: Diagnosis not present

## 2018-12-07 DIAGNOSIS — D509 Iron deficiency anemia, unspecified: Secondary | ICD-10-CM | POA: Diagnosis not present

## 2018-12-07 DIAGNOSIS — Z992 Dependence on renal dialysis: Secondary | ICD-10-CM | POA: Diagnosis not present

## 2018-12-07 DIAGNOSIS — N186 End stage renal disease: Secondary | ICD-10-CM | POA: Diagnosis not present

## 2018-12-07 DIAGNOSIS — N2581 Secondary hyperparathyroidism of renal origin: Secondary | ICD-10-CM | POA: Diagnosis not present

## 2018-12-08 DIAGNOSIS — N186 End stage renal disease: Secondary | ICD-10-CM | POA: Diagnosis not present

## 2018-12-08 DIAGNOSIS — D509 Iron deficiency anemia, unspecified: Secondary | ICD-10-CM | POA: Diagnosis not present

## 2018-12-08 DIAGNOSIS — Z992 Dependence on renal dialysis: Secondary | ICD-10-CM | POA: Diagnosis not present

## 2018-12-08 DIAGNOSIS — N2581 Secondary hyperparathyroidism of renal origin: Secondary | ICD-10-CM | POA: Diagnosis not present

## 2018-12-09 DIAGNOSIS — Z992 Dependence on renal dialysis: Secondary | ICD-10-CM | POA: Diagnosis not present

## 2018-12-09 DIAGNOSIS — N186 End stage renal disease: Secondary | ICD-10-CM | POA: Diagnosis not present

## 2018-12-09 DIAGNOSIS — N2581 Secondary hyperparathyroidism of renal origin: Secondary | ICD-10-CM | POA: Diagnosis not present

## 2018-12-09 DIAGNOSIS — D509 Iron deficiency anemia, unspecified: Secondary | ICD-10-CM | POA: Diagnosis not present

## 2018-12-10 DIAGNOSIS — D509 Iron deficiency anemia, unspecified: Secondary | ICD-10-CM | POA: Diagnosis not present

## 2018-12-10 DIAGNOSIS — N2581 Secondary hyperparathyroidism of renal origin: Secondary | ICD-10-CM | POA: Diagnosis not present

## 2018-12-10 DIAGNOSIS — Z992 Dependence on renal dialysis: Secondary | ICD-10-CM | POA: Diagnosis not present

## 2018-12-10 DIAGNOSIS — N186 End stage renal disease: Secondary | ICD-10-CM | POA: Diagnosis not present

## 2018-12-11 DIAGNOSIS — N186 End stage renal disease: Secondary | ICD-10-CM | POA: Diagnosis not present

## 2018-12-11 DIAGNOSIS — D509 Iron deficiency anemia, unspecified: Secondary | ICD-10-CM | POA: Diagnosis not present

## 2018-12-11 DIAGNOSIS — Z992 Dependence on renal dialysis: Secondary | ICD-10-CM | POA: Diagnosis not present

## 2018-12-11 DIAGNOSIS — N2581 Secondary hyperparathyroidism of renal origin: Secondary | ICD-10-CM | POA: Diagnosis not present

## 2018-12-12 DIAGNOSIS — Z992 Dependence on renal dialysis: Secondary | ICD-10-CM | POA: Diagnosis not present

## 2018-12-12 DIAGNOSIS — N2581 Secondary hyperparathyroidism of renal origin: Secondary | ICD-10-CM | POA: Diagnosis not present

## 2018-12-12 DIAGNOSIS — D509 Iron deficiency anemia, unspecified: Secondary | ICD-10-CM | POA: Diagnosis not present

## 2018-12-12 DIAGNOSIS — N186 End stage renal disease: Secondary | ICD-10-CM | POA: Diagnosis not present

## 2018-12-13 DIAGNOSIS — D509 Iron deficiency anemia, unspecified: Secondary | ICD-10-CM | POA: Diagnosis not present

## 2018-12-13 DIAGNOSIS — N2581 Secondary hyperparathyroidism of renal origin: Secondary | ICD-10-CM | POA: Diagnosis not present

## 2018-12-13 DIAGNOSIS — N186 End stage renal disease: Secondary | ICD-10-CM | POA: Diagnosis not present

## 2018-12-13 DIAGNOSIS — Z992 Dependence on renal dialysis: Secondary | ICD-10-CM | POA: Diagnosis not present

## 2018-12-14 DIAGNOSIS — N2581 Secondary hyperparathyroidism of renal origin: Secondary | ICD-10-CM | POA: Diagnosis not present

## 2018-12-14 DIAGNOSIS — Z992 Dependence on renal dialysis: Secondary | ICD-10-CM | POA: Diagnosis not present

## 2018-12-14 DIAGNOSIS — N186 End stage renal disease: Secondary | ICD-10-CM | POA: Diagnosis not present

## 2018-12-14 DIAGNOSIS — D509 Iron deficiency anemia, unspecified: Secondary | ICD-10-CM | POA: Diagnosis not present

## 2018-12-15 DIAGNOSIS — N2581 Secondary hyperparathyroidism of renal origin: Secondary | ICD-10-CM | POA: Diagnosis not present

## 2018-12-15 DIAGNOSIS — N186 End stage renal disease: Secondary | ICD-10-CM | POA: Diagnosis not present

## 2018-12-15 DIAGNOSIS — D509 Iron deficiency anemia, unspecified: Secondary | ICD-10-CM | POA: Diagnosis not present

## 2018-12-15 DIAGNOSIS — Z992 Dependence on renal dialysis: Secondary | ICD-10-CM | POA: Diagnosis not present

## 2018-12-16 DIAGNOSIS — D509 Iron deficiency anemia, unspecified: Secondary | ICD-10-CM | POA: Diagnosis not present

## 2018-12-16 DIAGNOSIS — N186 End stage renal disease: Secondary | ICD-10-CM | POA: Diagnosis not present

## 2018-12-16 DIAGNOSIS — N2581 Secondary hyperparathyroidism of renal origin: Secondary | ICD-10-CM | POA: Diagnosis not present

## 2018-12-16 DIAGNOSIS — Z992 Dependence on renal dialysis: Secondary | ICD-10-CM | POA: Diagnosis not present

## 2018-12-17 DIAGNOSIS — D509 Iron deficiency anemia, unspecified: Secondary | ICD-10-CM | POA: Diagnosis not present

## 2018-12-17 DIAGNOSIS — Z992 Dependence on renal dialysis: Secondary | ICD-10-CM | POA: Diagnosis not present

## 2018-12-17 DIAGNOSIS — N2581 Secondary hyperparathyroidism of renal origin: Secondary | ICD-10-CM | POA: Diagnosis not present

## 2018-12-17 DIAGNOSIS — N186 End stage renal disease: Secondary | ICD-10-CM | POA: Diagnosis not present

## 2018-12-18 DIAGNOSIS — N2581 Secondary hyperparathyroidism of renal origin: Secondary | ICD-10-CM | POA: Diagnosis not present

## 2018-12-18 DIAGNOSIS — N186 End stage renal disease: Secondary | ICD-10-CM | POA: Diagnosis not present

## 2018-12-18 DIAGNOSIS — D509 Iron deficiency anemia, unspecified: Secondary | ICD-10-CM | POA: Diagnosis not present

## 2018-12-18 DIAGNOSIS — Z992 Dependence on renal dialysis: Secondary | ICD-10-CM | POA: Diagnosis not present

## 2018-12-19 DIAGNOSIS — Z992 Dependence on renal dialysis: Secondary | ICD-10-CM | POA: Diagnosis not present

## 2018-12-19 DIAGNOSIS — D509 Iron deficiency anemia, unspecified: Secondary | ICD-10-CM | POA: Diagnosis not present

## 2018-12-19 DIAGNOSIS — N2581 Secondary hyperparathyroidism of renal origin: Secondary | ICD-10-CM | POA: Diagnosis not present

## 2018-12-19 DIAGNOSIS — N186 End stage renal disease: Secondary | ICD-10-CM | POA: Diagnosis not present

## 2018-12-20 DIAGNOSIS — N186 End stage renal disease: Secondary | ICD-10-CM | POA: Diagnosis not present

## 2018-12-20 DIAGNOSIS — N2581 Secondary hyperparathyroidism of renal origin: Secondary | ICD-10-CM | POA: Diagnosis not present

## 2018-12-20 DIAGNOSIS — D509 Iron deficiency anemia, unspecified: Secondary | ICD-10-CM | POA: Diagnosis not present

## 2018-12-20 DIAGNOSIS — Z992 Dependence on renal dialysis: Secondary | ICD-10-CM | POA: Diagnosis not present

## 2018-12-21 DIAGNOSIS — D509 Iron deficiency anemia, unspecified: Secondary | ICD-10-CM | POA: Diagnosis not present

## 2018-12-21 DIAGNOSIS — N2581 Secondary hyperparathyroidism of renal origin: Secondary | ICD-10-CM | POA: Diagnosis not present

## 2018-12-21 DIAGNOSIS — N186 End stage renal disease: Secondary | ICD-10-CM | POA: Diagnosis not present

## 2018-12-21 DIAGNOSIS — Z992 Dependence on renal dialysis: Secondary | ICD-10-CM | POA: Diagnosis not present

## 2018-12-22 DIAGNOSIS — N186 End stage renal disease: Secondary | ICD-10-CM | POA: Diagnosis not present

## 2018-12-22 DIAGNOSIS — Z992 Dependence on renal dialysis: Secondary | ICD-10-CM | POA: Diagnosis not present

## 2018-12-22 DIAGNOSIS — D509 Iron deficiency anemia, unspecified: Secondary | ICD-10-CM | POA: Diagnosis not present

## 2018-12-22 DIAGNOSIS — N2581 Secondary hyperparathyroidism of renal origin: Secondary | ICD-10-CM | POA: Diagnosis not present

## 2018-12-23 DIAGNOSIS — N2581 Secondary hyperparathyroidism of renal origin: Secondary | ICD-10-CM | POA: Diagnosis not present

## 2018-12-23 DIAGNOSIS — Z992 Dependence on renal dialysis: Secondary | ICD-10-CM | POA: Diagnosis not present

## 2018-12-23 DIAGNOSIS — D509 Iron deficiency anemia, unspecified: Secondary | ICD-10-CM | POA: Diagnosis not present

## 2018-12-23 DIAGNOSIS — N186 End stage renal disease: Secondary | ICD-10-CM | POA: Diagnosis not present

## 2018-12-24 DIAGNOSIS — N186 End stage renal disease: Secondary | ICD-10-CM | POA: Diagnosis not present

## 2018-12-24 DIAGNOSIS — Z992 Dependence on renal dialysis: Secondary | ICD-10-CM | POA: Diagnosis not present

## 2018-12-24 DIAGNOSIS — N2581 Secondary hyperparathyroidism of renal origin: Secondary | ICD-10-CM | POA: Diagnosis not present

## 2018-12-24 DIAGNOSIS — D509 Iron deficiency anemia, unspecified: Secondary | ICD-10-CM | POA: Diagnosis not present

## 2018-12-25 DIAGNOSIS — D509 Iron deficiency anemia, unspecified: Secondary | ICD-10-CM | POA: Diagnosis not present

## 2018-12-25 DIAGNOSIS — Z992 Dependence on renal dialysis: Secondary | ICD-10-CM | POA: Diagnosis not present

## 2018-12-25 DIAGNOSIS — N186 End stage renal disease: Secondary | ICD-10-CM | POA: Diagnosis not present

## 2018-12-25 DIAGNOSIS — N2581 Secondary hyperparathyroidism of renal origin: Secondary | ICD-10-CM | POA: Diagnosis not present

## 2018-12-26 DIAGNOSIS — Z992 Dependence on renal dialysis: Secondary | ICD-10-CM | POA: Diagnosis not present

## 2018-12-26 DIAGNOSIS — N2581 Secondary hyperparathyroidism of renal origin: Secondary | ICD-10-CM | POA: Diagnosis not present

## 2018-12-26 DIAGNOSIS — D509 Iron deficiency anemia, unspecified: Secondary | ICD-10-CM | POA: Diagnosis not present

## 2018-12-26 DIAGNOSIS — N186 End stage renal disease: Secondary | ICD-10-CM | POA: Diagnosis not present

## 2018-12-27 DIAGNOSIS — Z992 Dependence on renal dialysis: Secondary | ICD-10-CM | POA: Diagnosis not present

## 2018-12-27 DIAGNOSIS — D509 Iron deficiency anemia, unspecified: Secondary | ICD-10-CM | POA: Diagnosis not present

## 2018-12-27 DIAGNOSIS — N186 End stage renal disease: Secondary | ICD-10-CM | POA: Diagnosis not present

## 2018-12-27 DIAGNOSIS — N2581 Secondary hyperparathyroidism of renal origin: Secondary | ICD-10-CM | POA: Diagnosis not present

## 2018-12-28 DIAGNOSIS — D508 Other iron deficiency anemias: Secondary | ICD-10-CM | POA: Diagnosis not present

## 2018-12-28 DIAGNOSIS — Z992 Dependence on renal dialysis: Secondary | ICD-10-CM | POA: Diagnosis not present

## 2018-12-28 DIAGNOSIS — N2581 Secondary hyperparathyroidism of renal origin: Secondary | ICD-10-CM | POA: Diagnosis not present

## 2018-12-28 DIAGNOSIS — Z125 Encounter for screening for malignant neoplasm of prostate: Secondary | ICD-10-CM | POA: Diagnosis not present

## 2018-12-28 DIAGNOSIS — D509 Iron deficiency anemia, unspecified: Secondary | ICD-10-CM | POA: Diagnosis not present

## 2018-12-28 DIAGNOSIS — Z0000 Encounter for general adult medical examination without abnormal findings: Secondary | ICD-10-CM | POA: Diagnosis not present

## 2018-12-28 DIAGNOSIS — K210 Gastro-esophageal reflux disease with esophagitis: Secondary | ICD-10-CM | POA: Diagnosis not present

## 2018-12-28 DIAGNOSIS — N401 Enlarged prostate with lower urinary tract symptoms: Secondary | ICD-10-CM | POA: Diagnosis not present

## 2018-12-28 DIAGNOSIS — N185 Chronic kidney disease, stage 5: Secondary | ICD-10-CM | POA: Diagnosis not present

## 2018-12-28 DIAGNOSIS — N186 End stage renal disease: Secondary | ICD-10-CM | POA: Diagnosis not present

## 2018-12-28 DIAGNOSIS — I10 Essential (primary) hypertension: Secondary | ICD-10-CM | POA: Diagnosis not present

## 2018-12-29 DIAGNOSIS — Z992 Dependence on renal dialysis: Secondary | ICD-10-CM | POA: Diagnosis not present

## 2018-12-29 DIAGNOSIS — D509 Iron deficiency anemia, unspecified: Secondary | ICD-10-CM | POA: Diagnosis not present

## 2018-12-29 DIAGNOSIS — N2581 Secondary hyperparathyroidism of renal origin: Secondary | ICD-10-CM | POA: Diagnosis not present

## 2018-12-29 DIAGNOSIS — N186 End stage renal disease: Secondary | ICD-10-CM | POA: Diagnosis not present

## 2018-12-30 DIAGNOSIS — N2581 Secondary hyperparathyroidism of renal origin: Secondary | ICD-10-CM | POA: Diagnosis not present

## 2018-12-30 DIAGNOSIS — D509 Iron deficiency anemia, unspecified: Secondary | ICD-10-CM | POA: Diagnosis not present

## 2018-12-30 DIAGNOSIS — Z992 Dependence on renal dialysis: Secondary | ICD-10-CM | POA: Diagnosis not present

## 2018-12-30 DIAGNOSIS — N186 End stage renal disease: Secondary | ICD-10-CM | POA: Diagnosis not present

## 2018-12-31 DIAGNOSIS — N2581 Secondary hyperparathyroidism of renal origin: Secondary | ICD-10-CM | POA: Diagnosis not present

## 2018-12-31 DIAGNOSIS — D509 Iron deficiency anemia, unspecified: Secondary | ICD-10-CM | POA: Diagnosis not present

## 2018-12-31 DIAGNOSIS — Z992 Dependence on renal dialysis: Secondary | ICD-10-CM | POA: Diagnosis not present

## 2018-12-31 DIAGNOSIS — N186 End stage renal disease: Secondary | ICD-10-CM | POA: Diagnosis not present

## 2019-01-01 DIAGNOSIS — Z992 Dependence on renal dialysis: Secondary | ICD-10-CM | POA: Diagnosis not present

## 2019-01-01 DIAGNOSIS — N2581 Secondary hyperparathyroidism of renal origin: Secondary | ICD-10-CM | POA: Diagnosis not present

## 2019-01-01 DIAGNOSIS — N186 End stage renal disease: Secondary | ICD-10-CM | POA: Diagnosis not present

## 2019-01-01 DIAGNOSIS — D509 Iron deficiency anemia, unspecified: Secondary | ICD-10-CM | POA: Diagnosis not present

## 2019-01-02 DIAGNOSIS — N186 End stage renal disease: Secondary | ICD-10-CM | POA: Diagnosis not present

## 2019-01-02 DIAGNOSIS — Z992 Dependence on renal dialysis: Secondary | ICD-10-CM | POA: Diagnosis not present

## 2019-01-02 DIAGNOSIS — D509 Iron deficiency anemia, unspecified: Secondary | ICD-10-CM | POA: Diagnosis not present

## 2019-01-02 DIAGNOSIS — N2581 Secondary hyperparathyroidism of renal origin: Secondary | ICD-10-CM | POA: Diagnosis not present

## 2019-01-03 DIAGNOSIS — N2581 Secondary hyperparathyroidism of renal origin: Secondary | ICD-10-CM | POA: Diagnosis not present

## 2019-01-03 DIAGNOSIS — D509 Iron deficiency anemia, unspecified: Secondary | ICD-10-CM | POA: Diagnosis not present

## 2019-01-03 DIAGNOSIS — N186 End stage renal disease: Secondary | ICD-10-CM | POA: Diagnosis not present

## 2019-01-03 DIAGNOSIS — Z992 Dependence on renal dialysis: Secondary | ICD-10-CM | POA: Diagnosis not present

## 2019-01-04 DIAGNOSIS — D509 Iron deficiency anemia, unspecified: Secondary | ICD-10-CM | POA: Diagnosis not present

## 2019-01-04 DIAGNOSIS — Z992 Dependence on renal dialysis: Secondary | ICD-10-CM | POA: Diagnosis not present

## 2019-01-04 DIAGNOSIS — N186 End stage renal disease: Secondary | ICD-10-CM | POA: Diagnosis not present

## 2019-01-04 DIAGNOSIS — N2581 Secondary hyperparathyroidism of renal origin: Secondary | ICD-10-CM | POA: Diagnosis not present

## 2019-01-05 DIAGNOSIS — D509 Iron deficiency anemia, unspecified: Secondary | ICD-10-CM | POA: Diagnosis not present

## 2019-01-05 DIAGNOSIS — N186 End stage renal disease: Secondary | ICD-10-CM | POA: Diagnosis not present

## 2019-01-05 DIAGNOSIS — N2581 Secondary hyperparathyroidism of renal origin: Secondary | ICD-10-CM | POA: Diagnosis not present

## 2019-01-05 DIAGNOSIS — Z992 Dependence on renal dialysis: Secondary | ICD-10-CM | POA: Diagnosis not present

## 2019-01-06 DIAGNOSIS — D509 Iron deficiency anemia, unspecified: Secondary | ICD-10-CM | POA: Diagnosis not present

## 2019-01-06 DIAGNOSIS — N2581 Secondary hyperparathyroidism of renal origin: Secondary | ICD-10-CM | POA: Diagnosis not present

## 2019-01-06 DIAGNOSIS — N186 End stage renal disease: Secondary | ICD-10-CM | POA: Diagnosis not present

## 2019-01-06 DIAGNOSIS — Z992 Dependence on renal dialysis: Secondary | ICD-10-CM | POA: Diagnosis not present

## 2019-01-07 DIAGNOSIS — N2581 Secondary hyperparathyroidism of renal origin: Secondary | ICD-10-CM | POA: Diagnosis not present

## 2019-01-07 DIAGNOSIS — N186 End stage renal disease: Secondary | ICD-10-CM | POA: Diagnosis not present

## 2019-01-07 DIAGNOSIS — N281 Cyst of kidney, acquired: Secondary | ICD-10-CM | POA: Diagnosis not present

## 2019-01-07 DIAGNOSIS — Z992 Dependence on renal dialysis: Secondary | ICD-10-CM | POA: Diagnosis not present

## 2019-01-07 DIAGNOSIS — R319 Hematuria, unspecified: Secondary | ICD-10-CM | POA: Diagnosis not present

## 2019-01-07 DIAGNOSIS — N323 Diverticulum of bladder: Secondary | ICD-10-CM | POA: Diagnosis not present

## 2019-01-07 DIAGNOSIS — D509 Iron deficiency anemia, unspecified: Secondary | ICD-10-CM | POA: Diagnosis not present

## 2019-01-08 DIAGNOSIS — Z992 Dependence on renal dialysis: Secondary | ICD-10-CM | POA: Diagnosis not present

## 2019-01-08 DIAGNOSIS — N2581 Secondary hyperparathyroidism of renal origin: Secondary | ICD-10-CM | POA: Diagnosis not present

## 2019-01-08 DIAGNOSIS — N186 End stage renal disease: Secondary | ICD-10-CM | POA: Diagnosis not present

## 2019-01-08 DIAGNOSIS — D509 Iron deficiency anemia, unspecified: Secondary | ICD-10-CM | POA: Diagnosis not present

## 2019-01-09 DIAGNOSIS — D509 Iron deficiency anemia, unspecified: Secondary | ICD-10-CM | POA: Diagnosis not present

## 2019-01-09 DIAGNOSIS — Z992 Dependence on renal dialysis: Secondary | ICD-10-CM | POA: Diagnosis not present

## 2019-01-09 DIAGNOSIS — N2581 Secondary hyperparathyroidism of renal origin: Secondary | ICD-10-CM | POA: Diagnosis not present

## 2019-01-09 DIAGNOSIS — N186 End stage renal disease: Secondary | ICD-10-CM | POA: Diagnosis not present

## 2019-01-10 DIAGNOSIS — N2581 Secondary hyperparathyroidism of renal origin: Secondary | ICD-10-CM | POA: Diagnosis not present

## 2019-01-10 DIAGNOSIS — Z992 Dependence on renal dialysis: Secondary | ICD-10-CM | POA: Diagnosis not present

## 2019-01-10 DIAGNOSIS — D509 Iron deficiency anemia, unspecified: Secondary | ICD-10-CM | POA: Diagnosis not present

## 2019-01-10 DIAGNOSIS — N186 End stage renal disease: Secondary | ICD-10-CM | POA: Diagnosis not present

## 2019-01-11 DIAGNOSIS — N3289 Other specified disorders of bladder: Secondary | ICD-10-CM | POA: Diagnosis not present

## 2019-01-11 DIAGNOSIS — N3001 Acute cystitis with hematuria: Secondary | ICD-10-CM | POA: Diagnosis not present

## 2019-01-11 DIAGNOSIS — D509 Iron deficiency anemia, unspecified: Secondary | ICD-10-CM | POA: Diagnosis not present

## 2019-01-11 DIAGNOSIS — N2581 Secondary hyperparathyroidism of renal origin: Secondary | ICD-10-CM | POA: Diagnosis not present

## 2019-01-11 DIAGNOSIS — I700 Atherosclerosis of aorta: Secondary | ICD-10-CM | POA: Diagnosis not present

## 2019-01-11 DIAGNOSIS — N261 Atrophy of kidney (terminal): Secondary | ICD-10-CM | POA: Diagnosis not present

## 2019-01-11 DIAGNOSIS — Z7982 Long term (current) use of aspirin: Secondary | ICD-10-CM | POA: Diagnosis not present

## 2019-01-11 DIAGNOSIS — Z87891 Personal history of nicotine dependence: Secondary | ICD-10-CM | POA: Diagnosis not present

## 2019-01-11 DIAGNOSIS — Z79899 Other long term (current) drug therapy: Secondary | ICD-10-CM | POA: Diagnosis not present

## 2019-01-11 DIAGNOSIS — N186 End stage renal disease: Secondary | ICD-10-CM | POA: Diagnosis not present

## 2019-01-11 DIAGNOSIS — Z992 Dependence on renal dialysis: Secondary | ICD-10-CM | POA: Diagnosis not present

## 2019-01-11 DIAGNOSIS — I120 Hypertensive chronic kidney disease with stage 5 chronic kidney disease or end stage renal disease: Secondary | ICD-10-CM | POA: Diagnosis not present

## 2019-01-12 DIAGNOSIS — N2581 Secondary hyperparathyroidism of renal origin: Secondary | ICD-10-CM | POA: Diagnosis not present

## 2019-01-12 DIAGNOSIS — N186 End stage renal disease: Secondary | ICD-10-CM | POA: Diagnosis not present

## 2019-01-12 DIAGNOSIS — D509 Iron deficiency anemia, unspecified: Secondary | ICD-10-CM | POA: Diagnosis not present

## 2019-01-12 DIAGNOSIS — Z992 Dependence on renal dialysis: Secondary | ICD-10-CM | POA: Diagnosis not present

## 2019-01-13 DIAGNOSIS — D509 Iron deficiency anemia, unspecified: Secondary | ICD-10-CM | POA: Diagnosis not present

## 2019-01-13 DIAGNOSIS — N186 End stage renal disease: Secondary | ICD-10-CM | POA: Diagnosis not present

## 2019-01-13 DIAGNOSIS — Z992 Dependence on renal dialysis: Secondary | ICD-10-CM | POA: Diagnosis not present

## 2019-01-13 DIAGNOSIS — N2581 Secondary hyperparathyroidism of renal origin: Secondary | ICD-10-CM | POA: Diagnosis not present

## 2019-01-14 DIAGNOSIS — N2581 Secondary hyperparathyroidism of renal origin: Secondary | ICD-10-CM | POA: Diagnosis not present

## 2019-01-14 DIAGNOSIS — N186 End stage renal disease: Secondary | ICD-10-CM | POA: Diagnosis not present

## 2019-01-14 DIAGNOSIS — Z992 Dependence on renal dialysis: Secondary | ICD-10-CM | POA: Diagnosis not present

## 2019-01-14 DIAGNOSIS — D509 Iron deficiency anemia, unspecified: Secondary | ICD-10-CM | POA: Diagnosis not present

## 2019-01-15 DIAGNOSIS — Z992 Dependence on renal dialysis: Secondary | ICD-10-CM | POA: Diagnosis not present

## 2019-01-15 DIAGNOSIS — N2581 Secondary hyperparathyroidism of renal origin: Secondary | ICD-10-CM | POA: Diagnosis not present

## 2019-01-15 DIAGNOSIS — N186 End stage renal disease: Secondary | ICD-10-CM | POA: Diagnosis not present

## 2019-01-15 DIAGNOSIS — D509 Iron deficiency anemia, unspecified: Secondary | ICD-10-CM | POA: Diagnosis not present

## 2019-01-16 DIAGNOSIS — N186 End stage renal disease: Secondary | ICD-10-CM | POA: Diagnosis not present

## 2019-01-16 DIAGNOSIS — D509 Iron deficiency anemia, unspecified: Secondary | ICD-10-CM | POA: Diagnosis not present

## 2019-01-16 DIAGNOSIS — Z992 Dependence on renal dialysis: Secondary | ICD-10-CM | POA: Diagnosis not present

## 2019-01-16 DIAGNOSIS — N2581 Secondary hyperparathyroidism of renal origin: Secondary | ICD-10-CM | POA: Diagnosis not present

## 2019-01-17 DIAGNOSIS — D509 Iron deficiency anemia, unspecified: Secondary | ICD-10-CM | POA: Diagnosis not present

## 2019-01-17 DIAGNOSIS — N2581 Secondary hyperparathyroidism of renal origin: Secondary | ICD-10-CM | POA: Diagnosis not present

## 2019-01-17 DIAGNOSIS — N186 End stage renal disease: Secondary | ICD-10-CM | POA: Diagnosis not present

## 2019-01-17 DIAGNOSIS — Z992 Dependence on renal dialysis: Secondary | ICD-10-CM | POA: Diagnosis not present

## 2019-01-18 DIAGNOSIS — N186 End stage renal disease: Secondary | ICD-10-CM | POA: Diagnosis not present

## 2019-01-18 DIAGNOSIS — N2581 Secondary hyperparathyroidism of renal origin: Secondary | ICD-10-CM | POA: Diagnosis not present

## 2019-01-18 DIAGNOSIS — D509 Iron deficiency anemia, unspecified: Secondary | ICD-10-CM | POA: Diagnosis not present

## 2019-01-18 DIAGNOSIS — Z992 Dependence on renal dialysis: Secondary | ICD-10-CM | POA: Diagnosis not present

## 2019-01-19 DIAGNOSIS — Z992 Dependence on renal dialysis: Secondary | ICD-10-CM | POA: Diagnosis not present

## 2019-01-19 DIAGNOSIS — D509 Iron deficiency anemia, unspecified: Secondary | ICD-10-CM | POA: Diagnosis not present

## 2019-01-19 DIAGNOSIS — N2581 Secondary hyperparathyroidism of renal origin: Secondary | ICD-10-CM | POA: Diagnosis not present

## 2019-01-19 DIAGNOSIS — N186 End stage renal disease: Secondary | ICD-10-CM | POA: Diagnosis not present

## 2019-01-20 DIAGNOSIS — Z992 Dependence on renal dialysis: Secondary | ICD-10-CM | POA: Diagnosis not present

## 2019-01-20 DIAGNOSIS — N186 End stage renal disease: Secondary | ICD-10-CM | POA: Diagnosis not present

## 2019-01-20 DIAGNOSIS — N2581 Secondary hyperparathyroidism of renal origin: Secondary | ICD-10-CM | POA: Diagnosis not present

## 2019-01-20 DIAGNOSIS — D509 Iron deficiency anemia, unspecified: Secondary | ICD-10-CM | POA: Diagnosis not present

## 2019-01-21 DIAGNOSIS — D509 Iron deficiency anemia, unspecified: Secondary | ICD-10-CM | POA: Diagnosis not present

## 2019-01-21 DIAGNOSIS — N186 End stage renal disease: Secondary | ICD-10-CM | POA: Diagnosis not present

## 2019-01-21 DIAGNOSIS — Z79899 Other long term (current) drug therapy: Secondary | ICD-10-CM | POA: Diagnosis not present

## 2019-01-21 DIAGNOSIS — N2581 Secondary hyperparathyroidism of renal origin: Secondary | ICD-10-CM | POA: Diagnosis not present

## 2019-01-21 DIAGNOSIS — Z992 Dependence on renal dialysis: Secondary | ICD-10-CM | POA: Diagnosis not present

## 2019-01-22 DIAGNOSIS — D509 Iron deficiency anemia, unspecified: Secondary | ICD-10-CM | POA: Diagnosis not present

## 2019-01-22 DIAGNOSIS — N2581 Secondary hyperparathyroidism of renal origin: Secondary | ICD-10-CM | POA: Diagnosis not present

## 2019-01-22 DIAGNOSIS — Z992 Dependence on renal dialysis: Secondary | ICD-10-CM | POA: Diagnosis not present

## 2019-01-22 DIAGNOSIS — N186 End stage renal disease: Secondary | ICD-10-CM | POA: Diagnosis not present

## 2019-01-23 DIAGNOSIS — N2581 Secondary hyperparathyroidism of renal origin: Secondary | ICD-10-CM | POA: Diagnosis not present

## 2019-01-23 DIAGNOSIS — D509 Iron deficiency anemia, unspecified: Secondary | ICD-10-CM | POA: Diagnosis not present

## 2019-01-23 DIAGNOSIS — N186 End stage renal disease: Secondary | ICD-10-CM | POA: Diagnosis not present

## 2019-01-23 DIAGNOSIS — Z992 Dependence on renal dialysis: Secondary | ICD-10-CM | POA: Diagnosis not present

## 2019-01-24 DIAGNOSIS — N2581 Secondary hyperparathyroidism of renal origin: Secondary | ICD-10-CM | POA: Diagnosis not present

## 2019-01-24 DIAGNOSIS — N186 End stage renal disease: Secondary | ICD-10-CM | POA: Diagnosis not present

## 2019-01-24 DIAGNOSIS — D509 Iron deficiency anemia, unspecified: Secondary | ICD-10-CM | POA: Diagnosis not present

## 2019-01-24 DIAGNOSIS — Z992 Dependence on renal dialysis: Secondary | ICD-10-CM | POA: Diagnosis not present

## 2019-01-25 DIAGNOSIS — D509 Iron deficiency anemia, unspecified: Secondary | ICD-10-CM | POA: Diagnosis not present

## 2019-01-25 DIAGNOSIS — N2581 Secondary hyperparathyroidism of renal origin: Secondary | ICD-10-CM | POA: Diagnosis not present

## 2019-01-25 DIAGNOSIS — Z992 Dependence on renal dialysis: Secondary | ICD-10-CM | POA: Diagnosis not present

## 2019-01-25 DIAGNOSIS — N186 End stage renal disease: Secondary | ICD-10-CM | POA: Diagnosis not present

## 2019-01-26 DIAGNOSIS — N2581 Secondary hyperparathyroidism of renal origin: Secondary | ICD-10-CM | POA: Diagnosis not present

## 2019-01-26 DIAGNOSIS — D509 Iron deficiency anemia, unspecified: Secondary | ICD-10-CM | POA: Diagnosis not present

## 2019-01-26 DIAGNOSIS — N186 End stage renal disease: Secondary | ICD-10-CM | POA: Diagnosis not present

## 2019-01-26 DIAGNOSIS — Z992 Dependence on renal dialysis: Secondary | ICD-10-CM | POA: Diagnosis not present

## 2019-01-27 DIAGNOSIS — N186 End stage renal disease: Secondary | ICD-10-CM | POA: Diagnosis not present

## 2019-01-27 DIAGNOSIS — D509 Iron deficiency anemia, unspecified: Secondary | ICD-10-CM | POA: Diagnosis not present

## 2019-01-27 DIAGNOSIS — N2581 Secondary hyperparathyroidism of renal origin: Secondary | ICD-10-CM | POA: Diagnosis not present

## 2019-01-27 DIAGNOSIS — Z992 Dependence on renal dialysis: Secondary | ICD-10-CM | POA: Diagnosis not present

## 2019-01-28 DIAGNOSIS — N2581 Secondary hyperparathyroidism of renal origin: Secondary | ICD-10-CM | POA: Diagnosis not present

## 2019-01-28 DIAGNOSIS — Z992 Dependence on renal dialysis: Secondary | ICD-10-CM | POA: Diagnosis not present

## 2019-01-28 DIAGNOSIS — D509 Iron deficiency anemia, unspecified: Secondary | ICD-10-CM | POA: Diagnosis not present

## 2019-01-28 DIAGNOSIS — N186 End stage renal disease: Secondary | ICD-10-CM | POA: Diagnosis not present

## 2019-01-29 DIAGNOSIS — N2581 Secondary hyperparathyroidism of renal origin: Secondary | ICD-10-CM | POA: Diagnosis not present

## 2019-01-29 DIAGNOSIS — N186 End stage renal disease: Secondary | ICD-10-CM | POA: Diagnosis not present

## 2019-01-29 DIAGNOSIS — Z992 Dependence on renal dialysis: Secondary | ICD-10-CM | POA: Diagnosis not present

## 2019-01-29 DIAGNOSIS — D509 Iron deficiency anemia, unspecified: Secondary | ICD-10-CM | POA: Diagnosis not present

## 2019-01-30 DIAGNOSIS — Z992 Dependence on renal dialysis: Secondary | ICD-10-CM | POA: Diagnosis not present

## 2019-01-30 DIAGNOSIS — N2581 Secondary hyperparathyroidism of renal origin: Secondary | ICD-10-CM | POA: Diagnosis not present

## 2019-01-30 DIAGNOSIS — N186 End stage renal disease: Secondary | ICD-10-CM | POA: Diagnosis not present

## 2019-01-30 DIAGNOSIS — D509 Iron deficiency anemia, unspecified: Secondary | ICD-10-CM | POA: Diagnosis not present

## 2019-01-31 DIAGNOSIS — N2581 Secondary hyperparathyroidism of renal origin: Secondary | ICD-10-CM | POA: Diagnosis not present

## 2019-01-31 DIAGNOSIS — D509 Iron deficiency anemia, unspecified: Secondary | ICD-10-CM | POA: Diagnosis not present

## 2019-01-31 DIAGNOSIS — N186 End stage renal disease: Secondary | ICD-10-CM | POA: Diagnosis not present

## 2019-01-31 DIAGNOSIS — Z992 Dependence on renal dialysis: Secondary | ICD-10-CM | POA: Diagnosis not present

## 2019-02-01 DIAGNOSIS — N2581 Secondary hyperparathyroidism of renal origin: Secondary | ICD-10-CM | POA: Diagnosis not present

## 2019-02-01 DIAGNOSIS — Z992 Dependence on renal dialysis: Secondary | ICD-10-CM | POA: Diagnosis not present

## 2019-02-01 DIAGNOSIS — D509 Iron deficiency anemia, unspecified: Secondary | ICD-10-CM | POA: Diagnosis not present

## 2019-02-01 DIAGNOSIS — N186 End stage renal disease: Secondary | ICD-10-CM | POA: Diagnosis not present

## 2019-02-02 DIAGNOSIS — N186 End stage renal disease: Secondary | ICD-10-CM | POA: Diagnosis not present

## 2019-02-02 DIAGNOSIS — D509 Iron deficiency anemia, unspecified: Secondary | ICD-10-CM | POA: Diagnosis not present

## 2019-02-02 DIAGNOSIS — Z992 Dependence on renal dialysis: Secondary | ICD-10-CM | POA: Diagnosis not present

## 2019-02-02 DIAGNOSIS — N2581 Secondary hyperparathyroidism of renal origin: Secondary | ICD-10-CM | POA: Diagnosis not present

## 2019-02-03 ENCOUNTER — Telehealth: Payer: Self-pay | Admitting: Cardiovascular Disease

## 2019-02-03 DIAGNOSIS — Z992 Dependence on renal dialysis: Secondary | ICD-10-CM | POA: Diagnosis not present

## 2019-02-03 DIAGNOSIS — N186 End stage renal disease: Secondary | ICD-10-CM | POA: Diagnosis not present

## 2019-02-03 DIAGNOSIS — N2581 Secondary hyperparathyroidism of renal origin: Secondary | ICD-10-CM | POA: Diagnosis not present

## 2019-02-03 DIAGNOSIS — D509 Iron deficiency anemia, unspecified: Secondary | ICD-10-CM | POA: Diagnosis not present

## 2019-02-03 NOTE — Telephone Encounter (Signed)

## 2019-02-04 ENCOUNTER — Other Ambulatory Visit: Payer: Medicare Other

## 2019-02-04 ENCOUNTER — Other Ambulatory Visit: Payer: Self-pay

## 2019-02-04 ENCOUNTER — Ambulatory Visit (INDEPENDENT_AMBULATORY_CARE_PROVIDER_SITE_OTHER): Payer: Medicare Other

## 2019-02-04 DIAGNOSIS — N2581 Secondary hyperparathyroidism of renal origin: Secondary | ICD-10-CM | POA: Diagnosis not present

## 2019-02-04 DIAGNOSIS — I351 Nonrheumatic aortic (valve) insufficiency: Secondary | ICD-10-CM

## 2019-02-04 DIAGNOSIS — Z992 Dependence on renal dialysis: Secondary | ICD-10-CM | POA: Diagnosis not present

## 2019-02-04 DIAGNOSIS — D509 Iron deficiency anemia, unspecified: Secondary | ICD-10-CM | POA: Diagnosis not present

## 2019-02-04 DIAGNOSIS — N186 End stage renal disease: Secondary | ICD-10-CM | POA: Diagnosis not present

## 2019-02-05 ENCOUNTER — Telehealth: Payer: Self-pay | Admitting: *Deleted

## 2019-02-05 DIAGNOSIS — Z992 Dependence on renal dialysis: Secondary | ICD-10-CM | POA: Diagnosis not present

## 2019-02-05 DIAGNOSIS — D509 Iron deficiency anemia, unspecified: Secondary | ICD-10-CM | POA: Diagnosis not present

## 2019-02-05 DIAGNOSIS — N2581 Secondary hyperparathyroidism of renal origin: Secondary | ICD-10-CM | POA: Diagnosis not present

## 2019-02-05 DIAGNOSIS — N186 End stage renal disease: Secondary | ICD-10-CM | POA: Diagnosis not present

## 2019-02-05 NOTE — Telephone Encounter (Signed)
Notes recorded by Laurine Blazer, LPN on 6/50/3546 at 56:81 PM EDT  Left message to return call.   ------   Notes recorded by Erma Heritage, PA-C on 02/05/2019 at 7:20 AM EDT  Covering for Dr. Domenic Polite - Please let the patient know the pumping function of his heart remains stable with an EF of 60 to 65%. No regional wall motion abnormalities. Aortic regurgitation remains moderate which is similar to his prior study. Keep scheduled follow-up with Dr. Domenic Polite next month. Please forward a copy of results to Neale Burly, MD.

## 2019-02-06 DIAGNOSIS — D509 Iron deficiency anemia, unspecified: Secondary | ICD-10-CM | POA: Diagnosis not present

## 2019-02-06 DIAGNOSIS — N2581 Secondary hyperparathyroidism of renal origin: Secondary | ICD-10-CM | POA: Diagnosis not present

## 2019-02-06 DIAGNOSIS — Z992 Dependence on renal dialysis: Secondary | ICD-10-CM | POA: Diagnosis not present

## 2019-02-06 DIAGNOSIS — N186 End stage renal disease: Secondary | ICD-10-CM | POA: Diagnosis not present

## 2019-02-07 DIAGNOSIS — N2581 Secondary hyperparathyroidism of renal origin: Secondary | ICD-10-CM | POA: Diagnosis not present

## 2019-02-07 DIAGNOSIS — Z992 Dependence on renal dialysis: Secondary | ICD-10-CM | POA: Diagnosis not present

## 2019-02-07 DIAGNOSIS — N186 End stage renal disease: Secondary | ICD-10-CM | POA: Diagnosis not present

## 2019-02-07 DIAGNOSIS — D509 Iron deficiency anemia, unspecified: Secondary | ICD-10-CM | POA: Diagnosis not present

## 2019-02-08 DIAGNOSIS — Z992 Dependence on renal dialysis: Secondary | ICD-10-CM | POA: Diagnosis not present

## 2019-02-08 DIAGNOSIS — N2581 Secondary hyperparathyroidism of renal origin: Secondary | ICD-10-CM | POA: Diagnosis not present

## 2019-02-08 DIAGNOSIS — N186 End stage renal disease: Secondary | ICD-10-CM | POA: Diagnosis not present

## 2019-02-08 DIAGNOSIS — D509 Iron deficiency anemia, unspecified: Secondary | ICD-10-CM | POA: Diagnosis not present

## 2019-02-09 DIAGNOSIS — Z992 Dependence on renal dialysis: Secondary | ICD-10-CM | POA: Diagnosis not present

## 2019-02-09 DIAGNOSIS — N2581 Secondary hyperparathyroidism of renal origin: Secondary | ICD-10-CM | POA: Diagnosis not present

## 2019-02-09 DIAGNOSIS — D509 Iron deficiency anemia, unspecified: Secondary | ICD-10-CM | POA: Diagnosis not present

## 2019-02-09 DIAGNOSIS — N186 End stage renal disease: Secondary | ICD-10-CM | POA: Diagnosis not present

## 2019-02-09 NOTE — Telephone Encounter (Signed)
Patient informed. Copy sent to PCP °

## 2019-02-09 NOTE — Telephone Encounter (Signed)
-----   Message from Erma Heritage, [place] sent at 02/05/2019  7:20 AM EDT ----- Covering for Dr. Domenic Polite - Please let the patient know the pumping function of his heart remains stable with an EF of 60 to 65%.  No regional wall motion abnormalities.  Aortic regurgitation remains moderate which is similar to his prior study.  Keep scheduled follow-up with Dr. Domenic Polite next month.  Please forward a copy of results to Neale Burly, MD.

## 2019-02-10 DIAGNOSIS — N186 End stage renal disease: Secondary | ICD-10-CM | POA: Diagnosis not present

## 2019-02-10 DIAGNOSIS — N2581 Secondary hyperparathyroidism of renal origin: Secondary | ICD-10-CM | POA: Diagnosis not present

## 2019-02-10 DIAGNOSIS — D509 Iron deficiency anemia, unspecified: Secondary | ICD-10-CM | POA: Diagnosis not present

## 2019-02-10 DIAGNOSIS — Z992 Dependence on renal dialysis: Secondary | ICD-10-CM | POA: Diagnosis not present

## 2019-02-11 DIAGNOSIS — N186 End stage renal disease: Secondary | ICD-10-CM | POA: Diagnosis not present

## 2019-02-11 DIAGNOSIS — Z992 Dependence on renal dialysis: Secondary | ICD-10-CM | POA: Diagnosis not present

## 2019-02-11 DIAGNOSIS — N2581 Secondary hyperparathyroidism of renal origin: Secondary | ICD-10-CM | POA: Diagnosis not present

## 2019-02-11 DIAGNOSIS — D509 Iron deficiency anemia, unspecified: Secondary | ICD-10-CM | POA: Diagnosis not present

## 2019-02-12 DIAGNOSIS — N186 End stage renal disease: Secondary | ICD-10-CM | POA: Diagnosis not present

## 2019-02-12 DIAGNOSIS — D509 Iron deficiency anemia, unspecified: Secondary | ICD-10-CM | POA: Diagnosis not present

## 2019-02-12 DIAGNOSIS — N2581 Secondary hyperparathyroidism of renal origin: Secondary | ICD-10-CM | POA: Diagnosis not present

## 2019-02-12 DIAGNOSIS — Z992 Dependence on renal dialysis: Secondary | ICD-10-CM | POA: Diagnosis not present

## 2019-02-13 DIAGNOSIS — N186 End stage renal disease: Secondary | ICD-10-CM | POA: Diagnosis not present

## 2019-02-13 DIAGNOSIS — Z992 Dependence on renal dialysis: Secondary | ICD-10-CM | POA: Diagnosis not present

## 2019-02-13 DIAGNOSIS — N2581 Secondary hyperparathyroidism of renal origin: Secondary | ICD-10-CM | POA: Diagnosis not present

## 2019-02-13 DIAGNOSIS — D509 Iron deficiency anemia, unspecified: Secondary | ICD-10-CM | POA: Diagnosis not present

## 2019-02-14 DIAGNOSIS — N186 End stage renal disease: Secondary | ICD-10-CM | POA: Diagnosis not present

## 2019-02-14 DIAGNOSIS — Z992 Dependence on renal dialysis: Secondary | ICD-10-CM | POA: Diagnosis not present

## 2019-02-14 DIAGNOSIS — D509 Iron deficiency anemia, unspecified: Secondary | ICD-10-CM | POA: Diagnosis not present

## 2019-02-14 DIAGNOSIS — N2581 Secondary hyperparathyroidism of renal origin: Secondary | ICD-10-CM | POA: Diagnosis not present

## 2019-02-15 DIAGNOSIS — N186 End stage renal disease: Secondary | ICD-10-CM | POA: Diagnosis not present

## 2019-02-15 DIAGNOSIS — N2581 Secondary hyperparathyroidism of renal origin: Secondary | ICD-10-CM | POA: Diagnosis not present

## 2019-02-15 DIAGNOSIS — D509 Iron deficiency anemia, unspecified: Secondary | ICD-10-CM | POA: Diagnosis not present

## 2019-02-15 DIAGNOSIS — Z992 Dependence on renal dialysis: Secondary | ICD-10-CM | POA: Diagnosis not present

## 2019-02-16 DIAGNOSIS — Z992 Dependence on renal dialysis: Secondary | ICD-10-CM | POA: Diagnosis not present

## 2019-02-16 DIAGNOSIS — N186 End stage renal disease: Secondary | ICD-10-CM | POA: Diagnosis not present

## 2019-02-16 DIAGNOSIS — D509 Iron deficiency anemia, unspecified: Secondary | ICD-10-CM | POA: Diagnosis not present

## 2019-02-16 DIAGNOSIS — N2581 Secondary hyperparathyroidism of renal origin: Secondary | ICD-10-CM | POA: Diagnosis not present

## 2019-02-17 DIAGNOSIS — N2581 Secondary hyperparathyroidism of renal origin: Secondary | ICD-10-CM | POA: Diagnosis not present

## 2019-02-17 DIAGNOSIS — N186 End stage renal disease: Secondary | ICD-10-CM | POA: Diagnosis not present

## 2019-02-17 DIAGNOSIS — Z992 Dependence on renal dialysis: Secondary | ICD-10-CM | POA: Diagnosis not present

## 2019-02-17 DIAGNOSIS — D509 Iron deficiency anemia, unspecified: Secondary | ICD-10-CM | POA: Diagnosis not present

## 2019-02-18 DIAGNOSIS — N186 End stage renal disease: Secondary | ICD-10-CM | POA: Diagnosis not present

## 2019-02-18 DIAGNOSIS — Z992 Dependence on renal dialysis: Secondary | ICD-10-CM | POA: Diagnosis not present

## 2019-02-18 DIAGNOSIS — D509 Iron deficiency anemia, unspecified: Secondary | ICD-10-CM | POA: Diagnosis not present

## 2019-02-18 DIAGNOSIS — N2581 Secondary hyperparathyroidism of renal origin: Secondary | ICD-10-CM | POA: Diagnosis not present

## 2019-02-19 DIAGNOSIS — N2581 Secondary hyperparathyroidism of renal origin: Secondary | ICD-10-CM | POA: Diagnosis not present

## 2019-02-19 DIAGNOSIS — Z992 Dependence on renal dialysis: Secondary | ICD-10-CM | POA: Diagnosis not present

## 2019-02-19 DIAGNOSIS — N186 End stage renal disease: Secondary | ICD-10-CM | POA: Diagnosis not present

## 2019-02-19 DIAGNOSIS — D509 Iron deficiency anemia, unspecified: Secondary | ICD-10-CM | POA: Diagnosis not present

## 2019-02-20 DIAGNOSIS — N186 End stage renal disease: Secondary | ICD-10-CM | POA: Diagnosis not present

## 2019-02-20 DIAGNOSIS — Z992 Dependence on renal dialysis: Secondary | ICD-10-CM | POA: Diagnosis not present

## 2019-02-20 DIAGNOSIS — D509 Iron deficiency anemia, unspecified: Secondary | ICD-10-CM | POA: Diagnosis not present

## 2019-02-21 DIAGNOSIS — D509 Iron deficiency anemia, unspecified: Secondary | ICD-10-CM | POA: Diagnosis not present

## 2019-02-21 DIAGNOSIS — N186 End stage renal disease: Secondary | ICD-10-CM | POA: Diagnosis not present

## 2019-02-21 DIAGNOSIS — Z992 Dependence on renal dialysis: Secondary | ICD-10-CM | POA: Diagnosis not present

## 2019-02-22 DIAGNOSIS — D509 Iron deficiency anemia, unspecified: Secondary | ICD-10-CM | POA: Diagnosis not present

## 2019-02-22 DIAGNOSIS — N186 End stage renal disease: Secondary | ICD-10-CM | POA: Diagnosis not present

## 2019-02-22 DIAGNOSIS — Z992 Dependence on renal dialysis: Secondary | ICD-10-CM | POA: Diagnosis not present

## 2019-02-23 ENCOUNTER — Encounter: Payer: Self-pay | Admitting: Cardiology

## 2019-02-23 ENCOUNTER — Telehealth: Payer: Self-pay | Admitting: Cardiology

## 2019-02-23 ENCOUNTER — Telehealth (INDEPENDENT_AMBULATORY_CARE_PROVIDER_SITE_OTHER): Payer: Medicare Other | Admitting: Cardiology

## 2019-02-23 VITALS — BP 127/77 | HR 63 | Ht 73.0 in | Wt 176.1 lb

## 2019-02-23 DIAGNOSIS — N186 End stage renal disease: Secondary | ICD-10-CM

## 2019-02-23 DIAGNOSIS — I351 Nonrheumatic aortic (valve) insufficiency: Secondary | ICD-10-CM | POA: Diagnosis not present

## 2019-02-23 DIAGNOSIS — I10 Essential (primary) hypertension: Secondary | ICD-10-CM

## 2019-02-23 DIAGNOSIS — Z992 Dependence on renal dialysis: Secondary | ICD-10-CM | POA: Diagnosis not present

## 2019-02-23 DIAGNOSIS — D509 Iron deficiency anemia, unspecified: Secondary | ICD-10-CM | POA: Diagnosis not present

## 2019-02-23 NOTE — Patient Instructions (Addendum)
Medication Instructions:   Your physician recommends that you continue on your current medications as directed. Please refer to the Current Medication list given to you today.  Labwork:  NONE  Testing/Procedures: Your physician has requested that you have an echocardiogram in 1 year. Echocardiography is a painless test that uses sound waves to create images of your heart. It provides your doctor with information about the size and shape of your heart and how well your heart's chambers and valves are working. This procedure takes approximately one hour. There are no restrictions for this procedure.  Follow-Up:  Your physician recommends that you schedule a follow-up appointment in: 1 year. You will receive a reminder letter in the mail in about 10 months reminding you to call and schedule your appointment. If you don't receive this letter, please contact our office.  Any Other Special Instructions Will Be Listed Below (If Applicable).  If you need a refill on your cardiac medications before your next appointment, please call your pharmacy. 

## 2019-02-23 NOTE — Progress Notes (Signed)
Virtual Visit via Telephone Note   This visit type was conducted due to national recommendations for restrictions regarding the COVID-19 Pandemic (e.g. social distancing) in an effort to limit this patient's exposure and mitigate transmission in our community.  Due to his co-morbid illnesses, this patient is at least at moderate risk for complications without adequate follow up.  This format is felt to be most appropriate for this patient at this time.  The patient did not have access to video technology/had technical difficulties with video requiring transitioning to audio format only (telephone).  All issues noted in this document were discussed and addressed.  No physical exam could be performed with this format.  Please refer to the patient's chart for his  consent to telehealth for [facility].   Date:  02/23/2019   ID:  Rodney Gordon, DOB August 15, 1955, MRN [id number]  Patient Location: Home Provider Location: Office  PCP:  Neale Burly, MD  Cardiologist:  Rozann Lesches, MD Electrophysiologist:  None   Evaluation Performed:  Follow-Up Visit  Chief Complaint:   Cardiac follow-up  History of Present Illness:    Rodney Gordon is a 63 y.o. male last seen in July 2019.  We spoke by phone today.  He does not report any major changes in terms of potential cardiac symptoms over the last year.  Stable NYHA class II dyspnea.  No recurring exertional chest pain or palpitations.  He is still utilizing peritoneal dialysis and has followed with [name].  Recent follow-up echocardiogram revealed normal LVEF at 60 to 65% and also stable, moderate range aortic regurgitation.  I discussed the results with him today.  We went over his medications which are outlined below.  The patient does not have symptoms concerning for COVID-19 infection (fever, chills, cough, or new shortness of breath).  States that he wears a mask when he goes out.   Past Medical History:  Diagnosis Date  .  Anemia of chronic disease   . Aortic insufficiency    Mild to moderate  . Coronary atherosclerosis of native coronary artery    Nonobstructive at catheterization 2004  . ESRD on hemodialysis ([place])    [name]  . Essential hypertension   . Peripheral vascular disease [facility])    Past Surgical History:  Procedure Laterality Date  . A/V FISTULAGRAM Right 09/05/2017   Procedure: A/V FISTULAGRAM;  Surgeon: Angelia Mould, MD;  Location: [place] [facility];  Service: Cardiovascular;  Laterality: Right;  . AV FISTULA PLACEMENT Right 12/17/2016   Procedure: ARTERIOVENOUS (AV) FISTULA CREATION- RIGHT BRACHIOCEPHALIC;  Surgeon: Elam Dutch, MD;  Location: [place];  Service: Vascular;  Laterality: Right;  . CAPD INSERTION N/A 11/03/2017   Procedure: LAPAROSCOPIC INSERTION CONTINUOUS AMBULATORY PERITONEAL DIALYSIS  (CAPD) CATHETER;  Surgeon: Clovis Riley, MD;  Location: [place];  Service: General;  Laterality: N/A;  . CARDIAC CATHETERIZATION     [facility]  . IR DIALY SHUNT INTRO NEEDLE/INTRACATH INITIAL W/IMG RIGHT Right 04/24/2017  . IR US GUIDE VASC ACCESS RIGHT  04/24/2017  . PERIPHERAL VASCULAR BALLOON ANGIOPLASTY Right 09/05/2017   Procedure: PERIPHERAL VASCULAR BALLOON ANGIOPLASTY;  Surgeon: Angelia Mould, MD;  Location: [place] [facility];  Service: Cardiovascular;  Laterality: Right;  . PROSTATE SURGERY    . TIBIA FRACTURE SURGERY     Left     Current Meds  Medication Sig  . acetaminophen (TYLENOL) 500 MG tablet Take 1,000 mg by mouth 2 (two) times daily as needed  for mild pain.  Marland Kitchen acetaminophen (TYLENOL) 650 MG CR tablet Take 650 mg by mouth daily as needed for pain.  Marland Kitchen amLODipine (NORVASC) 10 MG tablet Take 1 tablet (10 mg total) by mouth daily.  Marland Kitchen aspirin 81 MG tablet Take 81 mg by mouth daily.   Marland Kitchen atenolol (TENORMIN) 50 MG tablet Take 50 mg by mouth daily.  . calcium acetate (PHOSLO) 667 MG capsule Take 667 mg by mouth 3 (three) times daily with meals.  .  diphenhydramine-acetaminophen (TYLENOL PM) 25-500 MG TABS tablet Take 1 tablet by mouth 2 (two) times daily as needed (PAIN).  Marland Kitchen doxazosin (CARDURA) 8 MG tablet Take 8 mg by mouth daily.  . furosemide (LASIX) 80 MG tablet Take 80 mg by mouth 2 (two) times daily.  Marland Kitchen ibuprofen (ADVIL,MOTRIN) 200 MG tablet Take 200 mg by mouth every 6 (six) hours as needed (for apin.).  Marland Kitchen isosorbide mononitrate (IMDUR) 30 MG 24 hr tablet Take 30 mg by mouth daily.  Marland Kitchen lidocaine-prilocaine (EMLA) cream Apply 1 application topically as needed (prior to dialysis).   . nitroGLYCERIN (NITROSTAT) 0.4 MG SL tablet Place 1 tablet (0.4 mg total) under the tongue as directed. (Patient taking differently: Place 0.4 mg under the tongue every 5 (five) minutes as needed for chest pain. )  . oxyCODONE-acetaminophen (PERCOCET/ROXICET) 5-325 MG tablet Take 1 tablet by mouth every 6 (six) hours as needed for severe pain.  . potassium chloride SA (K-DUR) 20 MEQ tablet Take 20 mEq by mouth daily.  . sodium bicarbonate 650 MG tablet Take 1,300 mg by mouth 3 (three) times daily.   . tamsulosin (FLOMAX) 0.4 MG CAPS capsule Take 1 capsule by mouth daily.     Allergies:   Patient has no known allergies.   Social History   Tobacco Use  . Smoking status: Former Smoker    Packs/day: 1.00    Years: 25.00    Pack years: 25.00    Types: Cigarettes    Start date: 07/07/1981    Quit date: 07/22/1998    Years since quitting: 20.6  . Smokeless tobacco: Never Used  Substance Use Topics  . Alcohol use: No    Alcohol/week: 0.0 standard drinks  . Drug use: No     Family Hx: The patient's family history includes Cancer in an other family member; Coronary artery disease in an other family member.  ROS:   Please see the history of present illness.    Interval urinary tract infection by report. All other systems reviewed and are negative.   Prior CV studies:   The following studies were reviewed today:  Echocardiogram 02/04/2019:  1.  The left ventricle has normal systolic function with an ejection fraction of 60-65%. The cavity size was normal. Left ventricular diastolic Doppler parameters are consistent with impaired relaxation. Elevated left ventricular end-diastolic pressure  No evidence of left ventricular regional wall motion abnormalities.  2. The right ventricle has low normal systolic function. The cavity was normal. There is mildly increased right ventricular wall thickness.  3. Left atrial size was mildly dilated.  4. The aortic valve is tricuspid. Mild thickening of the aortic valve. Aortic valve regurgitation is moderate by color flow Doppler. Moderate aortic annular calcification noted.  5. The mitral valve is grossly normal. Mitral valve regurgitation is moderate by color flow Doppler.  6. The tricuspid valve is grossly normal.  7. The aortic root is normal in size and structure.  Labs/Other Tests and Data Reviewed:    EKG:  An ECG dated 10/29/2017 was personally reviewed today and demonstrated:  Sinus bradycardia with LVH and repolarization abnormalities.  Recent Labs:  April 2019: BUN 32; Creatinine, Ser 9.82; Platelets 271, Hemoglobin 11.6; Potassium 4.4; Sodium 139   Wt Readings from Last 3 Encounters:  02/23/19 176 lb 1.6 oz (79.9 kg)  07/01/18 167 lb (75.8 kg)  01/27/18 172 lb 12.8 oz (78.4 kg)     Objective:    Vital Signs:  BP 127/77   Pulse 63   Ht 6\' 1"  (1.854 m)   Wt 176 lb 1.6 oz (79.9 kg)   BMI 23.23 kg/m    Patient spoke in full sentences, not short of breath. No audible wheezing or coughing. Speech pattern normal.  ASSESSMENT & PLAN:    1.  Aortic valve disease with overall stable, moderate range aortic regurgitation by follow-up echocardiogram.  LVEF is normal with no chamber dilatation.  Anticipate follow-up in 1 year.  2.  ESRD on peritoneal hemodialysis, follows with [name].  3.  Essential hypertension, blood pressure is well controlled today.  No changes made to  current regimen.  Keep follow-up with PCP.  COVID-19 Education: The signs and symptoms of COVID-19 were discussed with the patient and how to seek care for testing (follow up with PCP or arrange E-visit).  The importance of social distancing was discussed today.  Time:   Today, I have spent 5 minutes with the patient with telehealth technology discussing the above problems.     Medication Adjustments/Labs and Tests Ordered: Current medicines are reviewed at length with the patient today.  Concerns regarding medicines are outlined above.   Tests Ordered: Orders Placed This Encounter  Procedures  . ECHOCARDIOGRAM COMPLETE    Medication Changes: No orders of the defined types were placed in this encounter.   Follow Up:  In Person 1 year in the [place] office.  Signed, Rozann Lesches, MD  02/23/2019 3:58 PM    [place]

## 2019-02-23 NOTE — Telephone Encounter (Signed)
°  Precert needed for: Echo    Location: [facility]  [place]    Date:  Aug 2021

## 2019-02-24 DIAGNOSIS — D509 Iron deficiency anemia, unspecified: Secondary | ICD-10-CM | POA: Diagnosis not present

## 2019-02-24 DIAGNOSIS — N186 End stage renal disease: Secondary | ICD-10-CM | POA: Diagnosis not present

## 2019-02-24 DIAGNOSIS — Z992 Dependence on renal dialysis: Secondary | ICD-10-CM | POA: Diagnosis not present

## 2019-02-25 DIAGNOSIS — N186 End stage renal disease: Secondary | ICD-10-CM | POA: Diagnosis not present

## 2019-02-25 DIAGNOSIS — D509 Iron deficiency anemia, unspecified: Secondary | ICD-10-CM | POA: Diagnosis not present

## 2019-02-25 DIAGNOSIS — Z992 Dependence on renal dialysis: Secondary | ICD-10-CM | POA: Diagnosis not present

## 2019-02-26 DIAGNOSIS — Z992 Dependence on renal dialysis: Secondary | ICD-10-CM | POA: Diagnosis not present

## 2019-02-26 DIAGNOSIS — D509 Iron deficiency anemia, unspecified: Secondary | ICD-10-CM | POA: Diagnosis not present

## 2019-02-26 DIAGNOSIS — N186 End stage renal disease: Secondary | ICD-10-CM | POA: Diagnosis not present

## 2019-02-27 DIAGNOSIS — Z992 Dependence on renal dialysis: Secondary | ICD-10-CM | POA: Diagnosis not present

## 2019-02-27 DIAGNOSIS — D509 Iron deficiency anemia, unspecified: Secondary | ICD-10-CM | POA: Diagnosis not present

## 2019-02-27 DIAGNOSIS — N186 End stage renal disease: Secondary | ICD-10-CM | POA: Diagnosis not present

## 2019-02-28 DIAGNOSIS — N186 End stage renal disease: Secondary | ICD-10-CM | POA: Diagnosis not present

## 2019-02-28 DIAGNOSIS — D509 Iron deficiency anemia, unspecified: Secondary | ICD-10-CM | POA: Diagnosis not present

## 2019-02-28 DIAGNOSIS — Z992 Dependence on renal dialysis: Secondary | ICD-10-CM | POA: Diagnosis not present

## 2019-03-01 DIAGNOSIS — D509 Iron deficiency anemia, unspecified: Secondary | ICD-10-CM | POA: Diagnosis not present

## 2019-03-01 DIAGNOSIS — N186 End stage renal disease: Secondary | ICD-10-CM | POA: Diagnosis not present

## 2019-03-01 DIAGNOSIS — Z992 Dependence on renal dialysis: Secondary | ICD-10-CM | POA: Diagnosis not present

## 2019-03-02 DIAGNOSIS — N186 End stage renal disease: Secondary | ICD-10-CM | POA: Diagnosis not present

## 2019-03-02 DIAGNOSIS — D509 Iron deficiency anemia, unspecified: Secondary | ICD-10-CM | POA: Diagnosis not present

## 2019-03-02 DIAGNOSIS — Z992 Dependence on renal dialysis: Secondary | ICD-10-CM | POA: Diagnosis not present

## 2019-03-03 DIAGNOSIS — Z992 Dependence on renal dialysis: Secondary | ICD-10-CM | POA: Diagnosis not present

## 2019-03-03 DIAGNOSIS — D509 Iron deficiency anemia, unspecified: Secondary | ICD-10-CM | POA: Diagnosis not present

## 2019-03-03 DIAGNOSIS — N186 End stage renal disease: Secondary | ICD-10-CM | POA: Diagnosis not present

## 2019-03-03 IMAGING — CR DG CHEST 2V
2 series · 2 of 2 positions shown · non-contrast
Comparison: Chest x-ray dated 10/21/2016.

CLINICAL DATA: Pre-op for dialysis cath placement - hx of htn, ESRD
on dialysis, aortic insufficiency, nonsmoker

EXAM:
CHEST - 2 VIEW

[w chest pa]
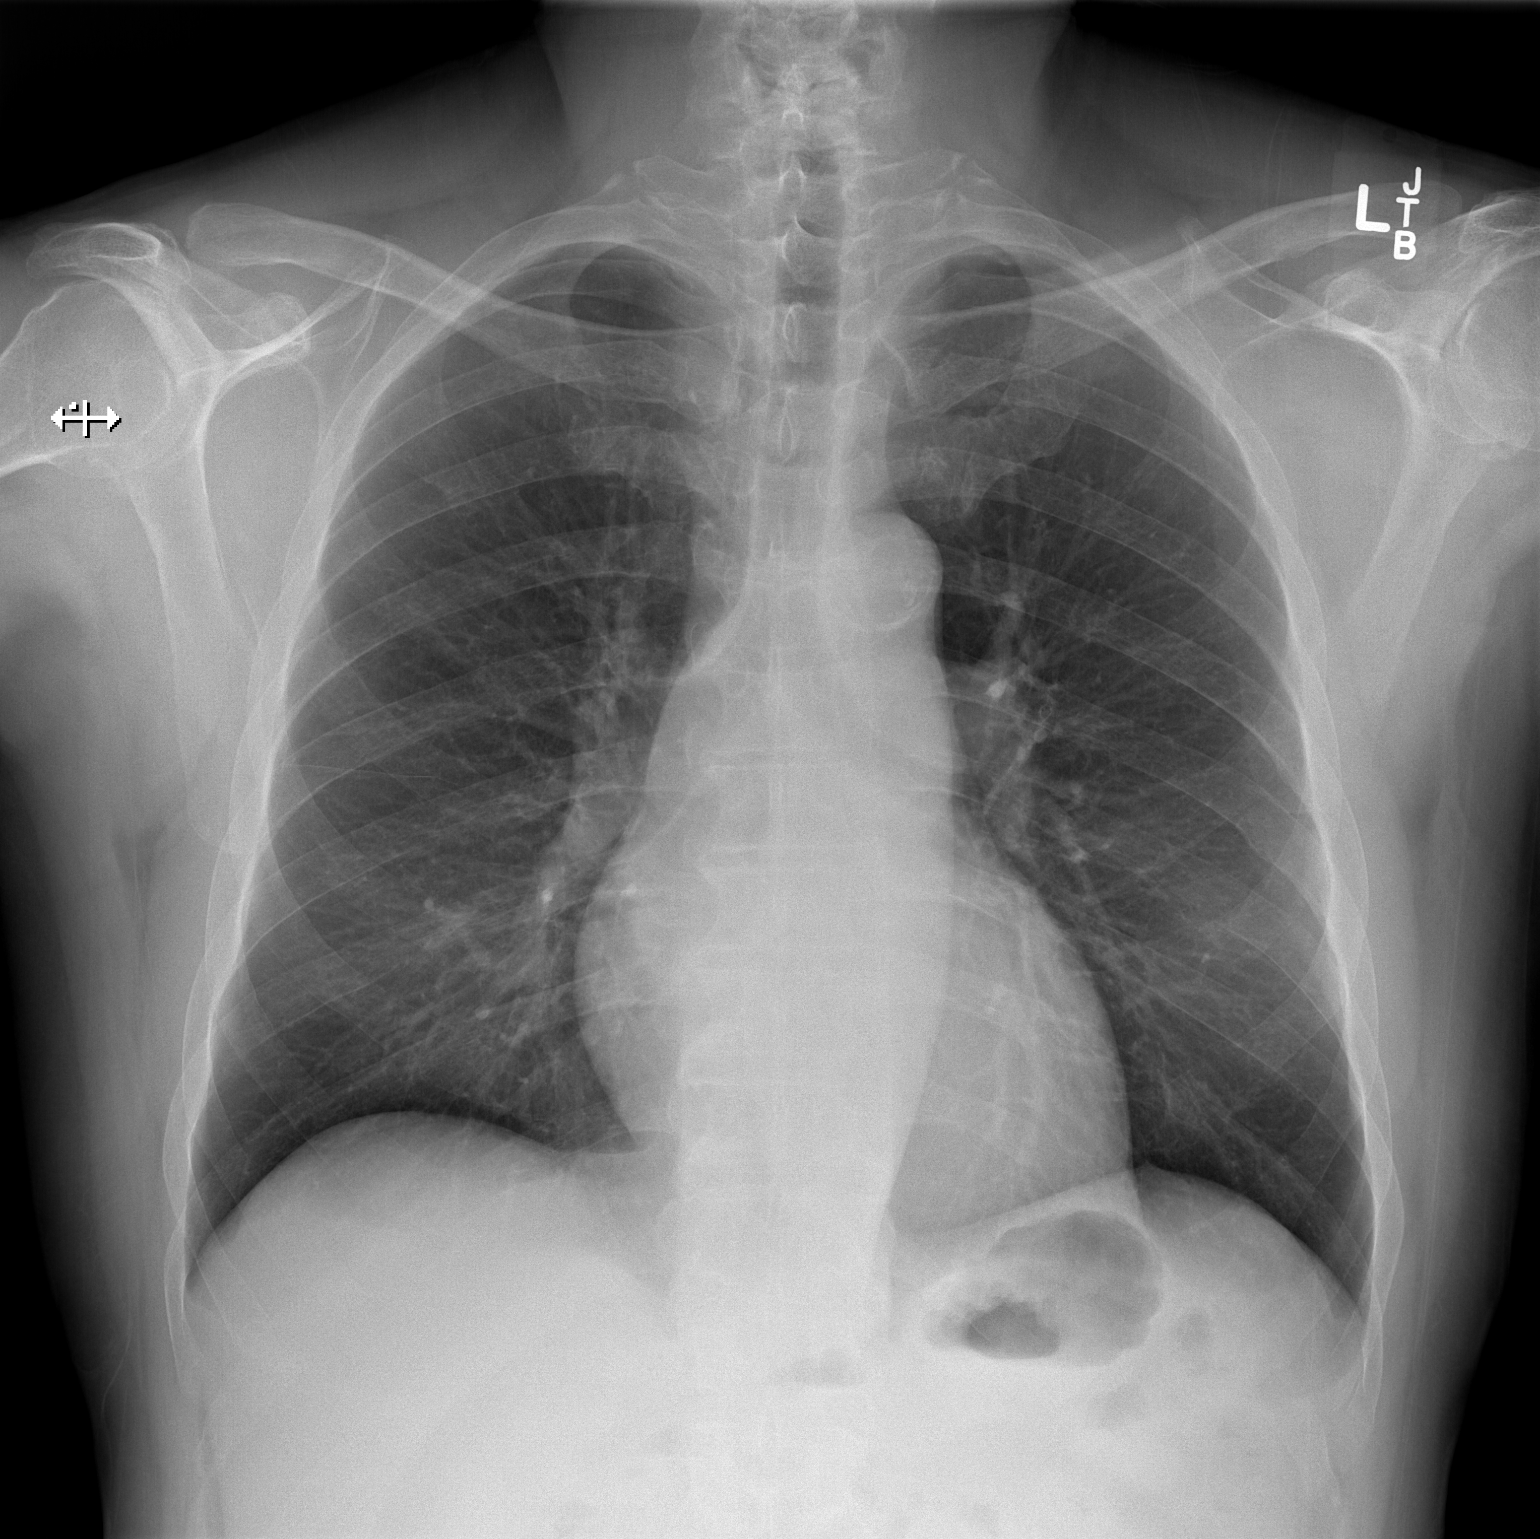

[w chest lat]
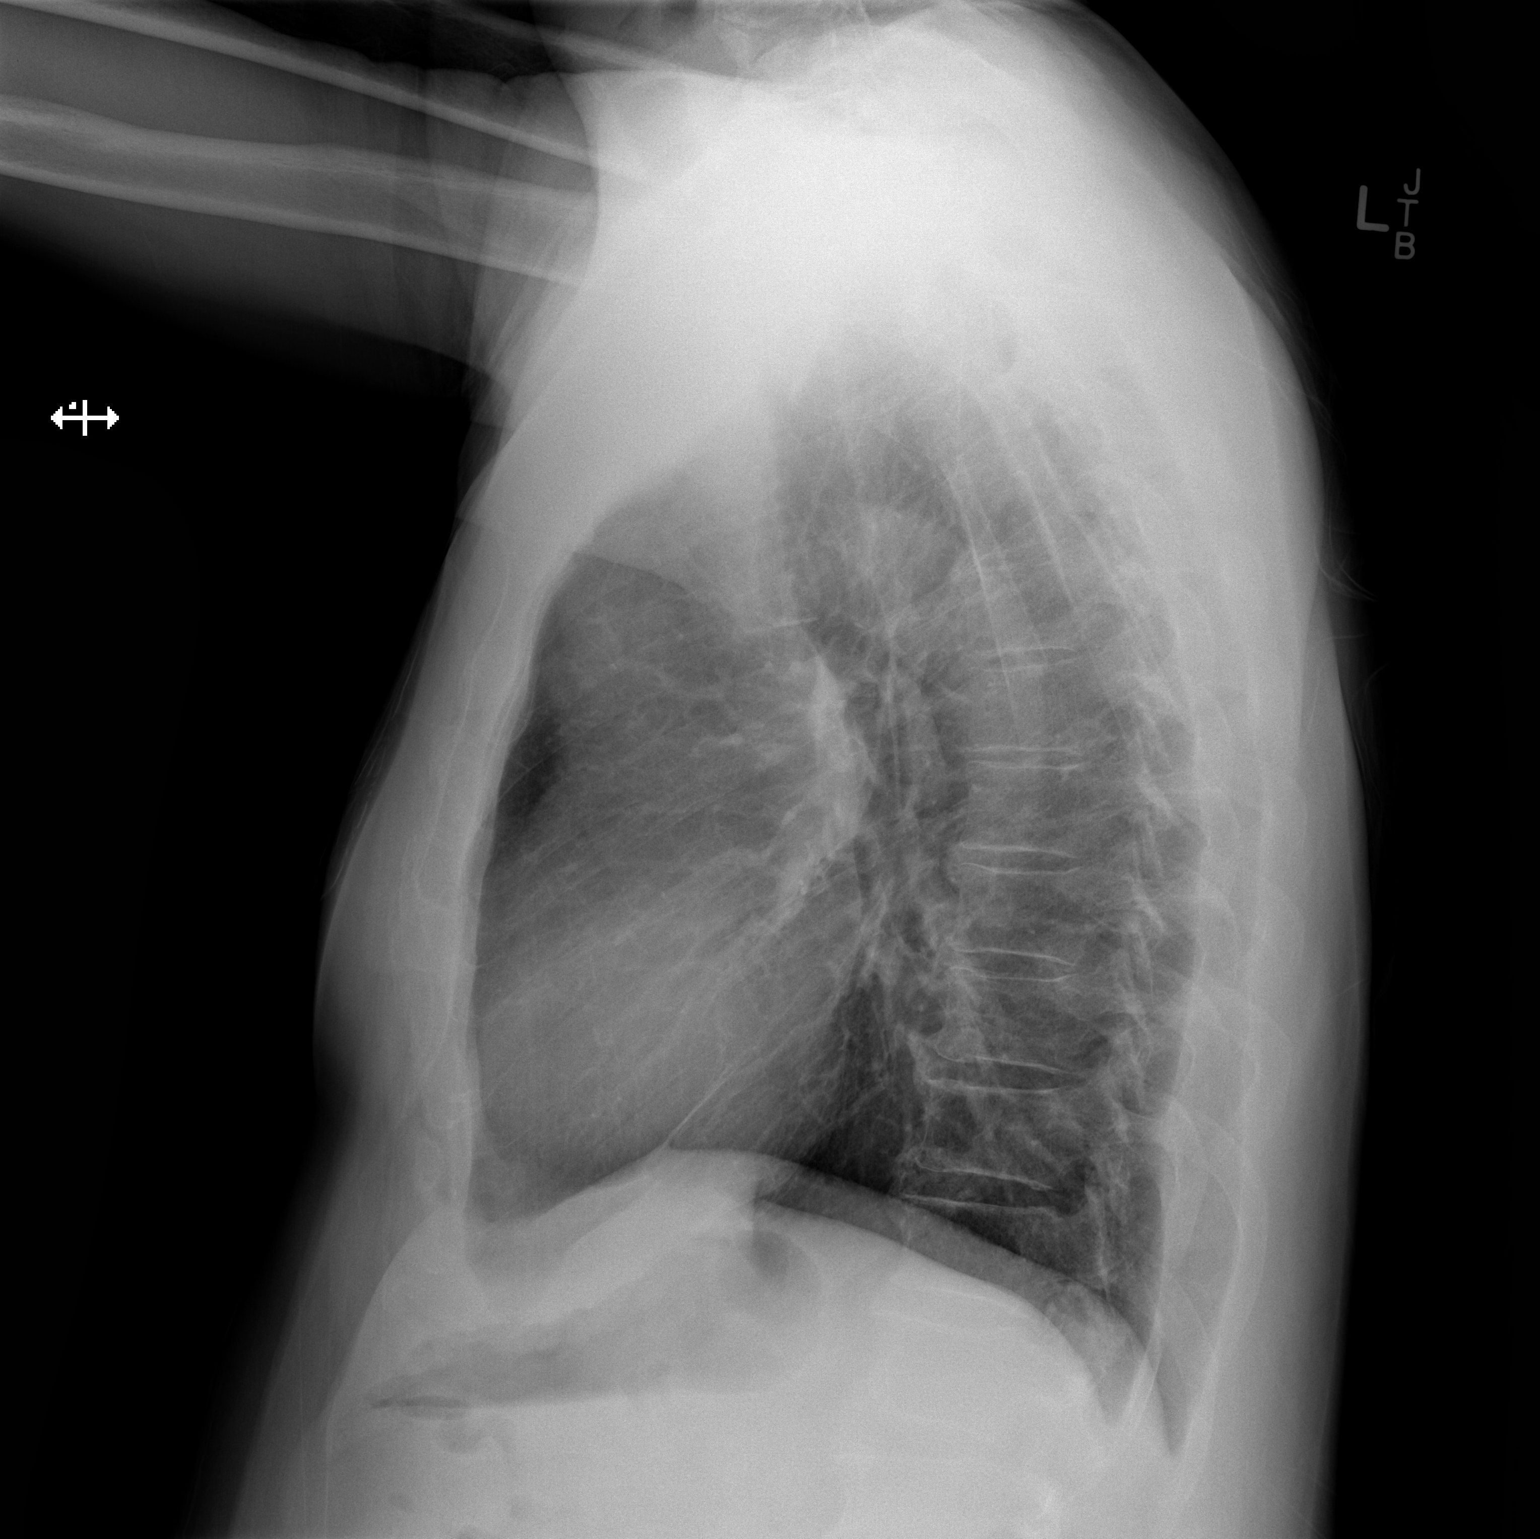

[2 of 2 positions shown; findings below may reference images not displayed]

FINDINGS: Heart size and mediastinal contours are within normal limits.
Atherosclerotic changes are seen at the aortic arch. Lungs are
clear. No pleural effusions seen. No acute or suspicious osseous
finding.
IMPRESSION: No active cardiopulmonary disease.

## 2019-03-04 DIAGNOSIS — D509 Iron deficiency anemia, unspecified: Secondary | ICD-10-CM | POA: Diagnosis not present

## 2019-03-04 DIAGNOSIS — Z992 Dependence on renal dialysis: Secondary | ICD-10-CM | POA: Diagnosis not present

## 2019-03-04 DIAGNOSIS — N186 End stage renal disease: Secondary | ICD-10-CM | POA: Diagnosis not present

## 2019-03-05 DIAGNOSIS — D509 Iron deficiency anemia, unspecified: Secondary | ICD-10-CM | POA: Diagnosis not present

## 2019-03-05 DIAGNOSIS — Z992 Dependence on renal dialysis: Secondary | ICD-10-CM | POA: Diagnosis not present

## 2019-03-05 DIAGNOSIS — N186 End stage renal disease: Secondary | ICD-10-CM | POA: Diagnosis not present

## 2019-03-06 DIAGNOSIS — Z992 Dependence on renal dialysis: Secondary | ICD-10-CM | POA: Diagnosis not present

## 2019-03-06 DIAGNOSIS — N186 End stage renal disease: Secondary | ICD-10-CM | POA: Diagnosis not present

## 2019-03-06 DIAGNOSIS — D509 Iron deficiency anemia, unspecified: Secondary | ICD-10-CM | POA: Diagnosis not present

## 2019-03-07 DIAGNOSIS — Z992 Dependence on renal dialysis: Secondary | ICD-10-CM | POA: Diagnosis not present

## 2019-03-07 DIAGNOSIS — N186 End stage renal disease: Secondary | ICD-10-CM | POA: Diagnosis not present

## 2019-03-07 DIAGNOSIS — D509 Iron deficiency anemia, unspecified: Secondary | ICD-10-CM | POA: Diagnosis not present

## 2019-03-08 DIAGNOSIS — Z992 Dependence on renal dialysis: Secondary | ICD-10-CM | POA: Diagnosis not present

## 2019-03-08 DIAGNOSIS — N186 End stage renal disease: Secondary | ICD-10-CM | POA: Diagnosis not present

## 2019-03-08 DIAGNOSIS — D509 Iron deficiency anemia, unspecified: Secondary | ICD-10-CM | POA: Diagnosis not present

## 2019-03-09 DIAGNOSIS — N186 End stage renal disease: Secondary | ICD-10-CM | POA: Diagnosis not present

## 2019-03-09 DIAGNOSIS — D509 Iron deficiency anemia, unspecified: Secondary | ICD-10-CM | POA: Diagnosis not present

## 2019-03-09 DIAGNOSIS — Z992 Dependence on renal dialysis: Secondary | ICD-10-CM | POA: Diagnosis not present

## 2019-03-10 DIAGNOSIS — N186 End stage renal disease: Secondary | ICD-10-CM | POA: Diagnosis not present

## 2019-03-10 DIAGNOSIS — Z992 Dependence on renal dialysis: Secondary | ICD-10-CM | POA: Diagnosis not present

## 2019-03-10 DIAGNOSIS — D509 Iron deficiency anemia, unspecified: Secondary | ICD-10-CM | POA: Diagnosis not present

## 2019-03-11 DIAGNOSIS — Z992 Dependence on renal dialysis: Secondary | ICD-10-CM | POA: Diagnosis not present

## 2019-03-11 DIAGNOSIS — D509 Iron deficiency anemia, unspecified: Secondary | ICD-10-CM | POA: Diagnosis not present

## 2019-03-11 DIAGNOSIS — N186 End stage renal disease: Secondary | ICD-10-CM | POA: Diagnosis not present

## 2019-03-12 DIAGNOSIS — N186 End stage renal disease: Secondary | ICD-10-CM | POA: Diagnosis not present

## 2019-03-12 DIAGNOSIS — D509 Iron deficiency anemia, unspecified: Secondary | ICD-10-CM | POA: Diagnosis not present

## 2019-03-12 DIAGNOSIS — Z992 Dependence on renal dialysis: Secondary | ICD-10-CM | POA: Diagnosis not present

## 2019-03-13 DIAGNOSIS — N186 End stage renal disease: Secondary | ICD-10-CM | POA: Diagnosis not present

## 2019-03-13 DIAGNOSIS — D509 Iron deficiency anemia, unspecified: Secondary | ICD-10-CM | POA: Diagnosis not present

## 2019-03-13 DIAGNOSIS — Z992 Dependence on renal dialysis: Secondary | ICD-10-CM | POA: Diagnosis not present

## 2019-03-14 DIAGNOSIS — N186 End stage renal disease: Secondary | ICD-10-CM | POA: Diagnosis not present

## 2019-03-14 DIAGNOSIS — D509 Iron deficiency anemia, unspecified: Secondary | ICD-10-CM | POA: Diagnosis not present

## 2019-03-14 DIAGNOSIS — Z992 Dependence on renal dialysis: Secondary | ICD-10-CM | POA: Diagnosis not present

## 2019-03-15 DIAGNOSIS — N186 End stage renal disease: Secondary | ICD-10-CM | POA: Diagnosis not present

## 2019-03-15 DIAGNOSIS — Z992 Dependence on renal dialysis: Secondary | ICD-10-CM | POA: Diagnosis not present

## 2019-03-15 DIAGNOSIS — D509 Iron deficiency anemia, unspecified: Secondary | ICD-10-CM | POA: Diagnosis not present

## 2019-03-16 DIAGNOSIS — Z992 Dependence on renal dialysis: Secondary | ICD-10-CM | POA: Diagnosis not present

## 2019-03-16 DIAGNOSIS — D509 Iron deficiency anemia, unspecified: Secondary | ICD-10-CM | POA: Diagnosis not present

## 2019-03-16 DIAGNOSIS — N186 End stage renal disease: Secondary | ICD-10-CM | POA: Diagnosis not present

## 2019-03-17 DIAGNOSIS — N186 End stage renal disease: Secondary | ICD-10-CM | POA: Diagnosis not present

## 2019-03-17 DIAGNOSIS — Z992 Dependence on renal dialysis: Secondary | ICD-10-CM | POA: Diagnosis not present

## 2019-03-17 DIAGNOSIS — D509 Iron deficiency anemia, unspecified: Secondary | ICD-10-CM | POA: Diagnosis not present

## 2019-03-18 DIAGNOSIS — D509 Iron deficiency anemia, unspecified: Secondary | ICD-10-CM | POA: Diagnosis not present

## 2019-03-18 DIAGNOSIS — Z992 Dependence on renal dialysis: Secondary | ICD-10-CM | POA: Diagnosis not present

## 2019-03-18 DIAGNOSIS — N186 End stage renal disease: Secondary | ICD-10-CM | POA: Diagnosis not present

## 2019-03-19 DIAGNOSIS — N186 End stage renal disease: Secondary | ICD-10-CM | POA: Diagnosis not present

## 2019-03-19 DIAGNOSIS — D509 Iron deficiency anemia, unspecified: Secondary | ICD-10-CM | POA: Diagnosis not present

## 2019-03-19 DIAGNOSIS — Z992 Dependence on renal dialysis: Secondary | ICD-10-CM | POA: Diagnosis not present

## 2019-03-20 DIAGNOSIS — D509 Iron deficiency anemia, unspecified: Secondary | ICD-10-CM | POA: Diagnosis not present

## 2019-03-20 DIAGNOSIS — Z992 Dependence on renal dialysis: Secondary | ICD-10-CM | POA: Diagnosis not present

## 2019-03-20 DIAGNOSIS — N186 End stage renal disease: Secondary | ICD-10-CM | POA: Diagnosis not present

## 2019-03-21 DIAGNOSIS — Z992 Dependence on renal dialysis: Secondary | ICD-10-CM | POA: Diagnosis not present

## 2019-03-21 DIAGNOSIS — D509 Iron deficiency anemia, unspecified: Secondary | ICD-10-CM | POA: Diagnosis not present

## 2019-03-21 DIAGNOSIS — N186 End stage renal disease: Secondary | ICD-10-CM | POA: Diagnosis not present

## 2019-03-22 DIAGNOSIS — Z992 Dependence on renal dialysis: Secondary | ICD-10-CM | POA: Diagnosis not present

## 2019-03-22 DIAGNOSIS — N186 End stage renal disease: Secondary | ICD-10-CM | POA: Diagnosis not present

## 2019-03-22 DIAGNOSIS — D509 Iron deficiency anemia, unspecified: Secondary | ICD-10-CM | POA: Diagnosis not present

## 2019-03-23 DIAGNOSIS — Z992 Dependence on renal dialysis: Secondary | ICD-10-CM | POA: Diagnosis not present

## 2019-03-23 DIAGNOSIS — N2581 Secondary hyperparathyroidism of renal origin: Secondary | ICD-10-CM | POA: Diagnosis not present

## 2019-03-23 DIAGNOSIS — N186 End stage renal disease: Secondary | ICD-10-CM | POA: Diagnosis not present

## 2019-03-23 DIAGNOSIS — D509 Iron deficiency anemia, unspecified: Secondary | ICD-10-CM | POA: Diagnosis not present

## 2019-03-24 DIAGNOSIS — D509 Iron deficiency anemia, unspecified: Secondary | ICD-10-CM | POA: Diagnosis not present

## 2019-03-24 DIAGNOSIS — N2581 Secondary hyperparathyroidism of renal origin: Secondary | ICD-10-CM | POA: Diagnosis not present

## 2019-03-24 DIAGNOSIS — N186 End stage renal disease: Secondary | ICD-10-CM | POA: Diagnosis not present

## 2019-03-24 DIAGNOSIS — Z992 Dependence on renal dialysis: Secondary | ICD-10-CM | POA: Diagnosis not present

## 2019-03-25 DIAGNOSIS — Z992 Dependence on renal dialysis: Secondary | ICD-10-CM | POA: Diagnosis not present

## 2019-03-25 DIAGNOSIS — N2581 Secondary hyperparathyroidism of renal origin: Secondary | ICD-10-CM | POA: Diagnosis not present

## 2019-03-25 DIAGNOSIS — D509 Iron deficiency anemia, unspecified: Secondary | ICD-10-CM | POA: Diagnosis not present

## 2019-03-25 DIAGNOSIS — N186 End stage renal disease: Secondary | ICD-10-CM | POA: Diagnosis not present

## 2019-03-26 DIAGNOSIS — N186 End stage renal disease: Secondary | ICD-10-CM | POA: Diagnosis not present

## 2019-03-26 DIAGNOSIS — N2581 Secondary hyperparathyroidism of renal origin: Secondary | ICD-10-CM | POA: Diagnosis not present

## 2019-03-26 DIAGNOSIS — Z992 Dependence on renal dialysis: Secondary | ICD-10-CM | POA: Diagnosis not present

## 2019-03-26 DIAGNOSIS — D509 Iron deficiency anemia, unspecified: Secondary | ICD-10-CM | POA: Diagnosis not present

## 2019-03-27 DIAGNOSIS — N186 End stage renal disease: Secondary | ICD-10-CM | POA: Diagnosis not present

## 2019-03-27 DIAGNOSIS — Z992 Dependence on renal dialysis: Secondary | ICD-10-CM | POA: Diagnosis not present

## 2019-03-27 DIAGNOSIS — D509 Iron deficiency anemia, unspecified: Secondary | ICD-10-CM | POA: Diagnosis not present

## 2019-03-27 DIAGNOSIS — N2581 Secondary hyperparathyroidism of renal origin: Secondary | ICD-10-CM | POA: Diagnosis not present

## 2019-03-28 DIAGNOSIS — N186 End stage renal disease: Secondary | ICD-10-CM | POA: Diagnosis not present

## 2019-03-28 DIAGNOSIS — N2581 Secondary hyperparathyroidism of renal origin: Secondary | ICD-10-CM | POA: Diagnosis not present

## 2019-03-28 DIAGNOSIS — Z992 Dependence on renal dialysis: Secondary | ICD-10-CM | POA: Diagnosis not present

## 2019-03-28 DIAGNOSIS — D509 Iron deficiency anemia, unspecified: Secondary | ICD-10-CM | POA: Diagnosis not present

## 2019-03-29 DIAGNOSIS — Z992 Dependence on renal dialysis: Secondary | ICD-10-CM | POA: Diagnosis not present

## 2019-03-29 DIAGNOSIS — N2581 Secondary hyperparathyroidism of renal origin: Secondary | ICD-10-CM | POA: Diagnosis not present

## 2019-03-29 DIAGNOSIS — D509 Iron deficiency anemia, unspecified: Secondary | ICD-10-CM | POA: Diagnosis not present

## 2019-03-29 DIAGNOSIS — N186 End stage renal disease: Secondary | ICD-10-CM | POA: Diagnosis not present

## 2019-03-30 DIAGNOSIS — N186 End stage renal disease: Secondary | ICD-10-CM | POA: Diagnosis not present

## 2019-03-30 DIAGNOSIS — Z992 Dependence on renal dialysis: Secondary | ICD-10-CM | POA: Diagnosis not present

## 2019-03-30 DIAGNOSIS — N2581 Secondary hyperparathyroidism of renal origin: Secondary | ICD-10-CM | POA: Diagnosis not present

## 2019-03-30 DIAGNOSIS — D509 Iron deficiency anemia, unspecified: Secondary | ICD-10-CM | POA: Diagnosis not present

## 2019-03-31 DIAGNOSIS — N186 End stage renal disease: Secondary | ICD-10-CM | POA: Diagnosis not present

## 2019-03-31 DIAGNOSIS — Z992 Dependence on renal dialysis: Secondary | ICD-10-CM | POA: Diagnosis not present

## 2019-03-31 DIAGNOSIS — N2581 Secondary hyperparathyroidism of renal origin: Secondary | ICD-10-CM | POA: Diagnosis not present

## 2019-03-31 DIAGNOSIS — D509 Iron deficiency anemia, unspecified: Secondary | ICD-10-CM | POA: Diagnosis not present

## 2019-04-01 DIAGNOSIS — D508 Other iron deficiency anemias: Secondary | ICD-10-CM | POA: Diagnosis not present

## 2019-04-01 DIAGNOSIS — D509 Iron deficiency anemia, unspecified: Secondary | ICD-10-CM | POA: Diagnosis not present

## 2019-04-01 DIAGNOSIS — I10 Essential (primary) hypertension: Secondary | ICD-10-CM | POA: Diagnosis not present

## 2019-04-01 DIAGNOSIS — K210 Gastro-esophageal reflux disease with esophagitis: Secondary | ICD-10-CM | POA: Diagnosis not present

## 2019-04-01 DIAGNOSIS — N186 End stage renal disease: Secondary | ICD-10-CM | POA: Diagnosis not present

## 2019-04-01 DIAGNOSIS — Z992 Dependence on renal dialysis: Secondary | ICD-10-CM | POA: Diagnosis not present

## 2019-04-01 DIAGNOSIS — N401 Enlarged prostate with lower urinary tract symptoms: Secondary | ICD-10-CM | POA: Diagnosis not present

## 2019-04-01 DIAGNOSIS — N185 Chronic kidney disease, stage 5: Secondary | ICD-10-CM | POA: Diagnosis not present

## 2019-04-01 DIAGNOSIS — N2581 Secondary hyperparathyroidism of renal origin: Secondary | ICD-10-CM | POA: Diagnosis not present

## 2019-04-02 DIAGNOSIS — D509 Iron deficiency anemia, unspecified: Secondary | ICD-10-CM | POA: Diagnosis not present

## 2019-04-02 DIAGNOSIS — N186 End stage renal disease: Secondary | ICD-10-CM | POA: Diagnosis not present

## 2019-04-02 DIAGNOSIS — N2581 Secondary hyperparathyroidism of renal origin: Secondary | ICD-10-CM | POA: Diagnosis not present

## 2019-04-02 DIAGNOSIS — Z992 Dependence on renal dialysis: Secondary | ICD-10-CM | POA: Diagnosis not present

## 2019-04-03 DIAGNOSIS — N186 End stage renal disease: Secondary | ICD-10-CM | POA: Diagnosis not present

## 2019-04-03 DIAGNOSIS — N2581 Secondary hyperparathyroidism of renal origin: Secondary | ICD-10-CM | POA: Diagnosis not present

## 2019-04-03 DIAGNOSIS — Z992 Dependence on renal dialysis: Secondary | ICD-10-CM | POA: Diagnosis not present

## 2019-04-03 DIAGNOSIS — D509 Iron deficiency anemia, unspecified: Secondary | ICD-10-CM | POA: Diagnosis not present

## 2019-04-04 DIAGNOSIS — N2581 Secondary hyperparathyroidism of renal origin: Secondary | ICD-10-CM | POA: Diagnosis not present

## 2019-04-04 DIAGNOSIS — N186 End stage renal disease: Secondary | ICD-10-CM | POA: Diagnosis not present

## 2019-04-04 DIAGNOSIS — Z992 Dependence on renal dialysis: Secondary | ICD-10-CM | POA: Diagnosis not present

## 2019-04-04 DIAGNOSIS — D509 Iron deficiency anemia, unspecified: Secondary | ICD-10-CM | POA: Diagnosis not present

## 2019-04-05 DIAGNOSIS — Z992 Dependence on renal dialysis: Secondary | ICD-10-CM | POA: Diagnosis not present

## 2019-04-05 DIAGNOSIS — D509 Iron deficiency anemia, unspecified: Secondary | ICD-10-CM | POA: Diagnosis not present

## 2019-04-05 DIAGNOSIS — N2581 Secondary hyperparathyroidism of renal origin: Secondary | ICD-10-CM | POA: Diagnosis not present

## 2019-04-05 DIAGNOSIS — N186 End stage renal disease: Secondary | ICD-10-CM | POA: Diagnosis not present

## 2019-04-06 DIAGNOSIS — Z992 Dependence on renal dialysis: Secondary | ICD-10-CM | POA: Diagnosis not present

## 2019-04-06 DIAGNOSIS — N2581 Secondary hyperparathyroidism of renal origin: Secondary | ICD-10-CM | POA: Diagnosis not present

## 2019-04-06 DIAGNOSIS — D509 Iron deficiency anemia, unspecified: Secondary | ICD-10-CM | POA: Diagnosis not present

## 2019-04-06 DIAGNOSIS — N186 End stage renal disease: Secondary | ICD-10-CM | POA: Diagnosis not present

## 2019-04-07 DIAGNOSIS — N2581 Secondary hyperparathyroidism of renal origin: Secondary | ICD-10-CM | POA: Diagnosis not present

## 2019-04-07 DIAGNOSIS — N186 End stage renal disease: Secondary | ICD-10-CM | POA: Diagnosis not present

## 2019-04-07 DIAGNOSIS — D509 Iron deficiency anemia, unspecified: Secondary | ICD-10-CM | POA: Diagnosis not present

## 2019-04-07 DIAGNOSIS — Z992 Dependence on renal dialysis: Secondary | ICD-10-CM | POA: Diagnosis not present

## 2019-04-08 DIAGNOSIS — N186 End stage renal disease: Secondary | ICD-10-CM | POA: Diagnosis not present

## 2019-04-08 DIAGNOSIS — N2581 Secondary hyperparathyroidism of renal origin: Secondary | ICD-10-CM | POA: Diagnosis not present

## 2019-04-08 DIAGNOSIS — Z992 Dependence on renal dialysis: Secondary | ICD-10-CM | POA: Diagnosis not present

## 2019-04-08 DIAGNOSIS — D509 Iron deficiency anemia, unspecified: Secondary | ICD-10-CM | POA: Diagnosis not present

## 2019-04-09 DIAGNOSIS — N2581 Secondary hyperparathyroidism of renal origin: Secondary | ICD-10-CM | POA: Diagnosis not present

## 2019-04-09 DIAGNOSIS — Z992 Dependence on renal dialysis: Secondary | ICD-10-CM | POA: Diagnosis not present

## 2019-04-09 DIAGNOSIS — D509 Iron deficiency anemia, unspecified: Secondary | ICD-10-CM | POA: Diagnosis not present

## 2019-04-09 DIAGNOSIS — N186 End stage renal disease: Secondary | ICD-10-CM | POA: Diagnosis not present

## 2019-04-10 DIAGNOSIS — N2581 Secondary hyperparathyroidism of renal origin: Secondary | ICD-10-CM | POA: Diagnosis not present

## 2019-04-10 DIAGNOSIS — Z992 Dependence on renal dialysis: Secondary | ICD-10-CM | POA: Diagnosis not present

## 2019-04-10 DIAGNOSIS — N186 End stage renal disease: Secondary | ICD-10-CM | POA: Diagnosis not present

## 2019-04-10 DIAGNOSIS — D509 Iron deficiency anemia, unspecified: Secondary | ICD-10-CM | POA: Diagnosis not present

## 2019-04-11 DIAGNOSIS — Z992 Dependence on renal dialysis: Secondary | ICD-10-CM | POA: Diagnosis not present

## 2019-04-11 DIAGNOSIS — N186 End stage renal disease: Secondary | ICD-10-CM | POA: Diagnosis not present

## 2019-04-11 DIAGNOSIS — D509 Iron deficiency anemia, unspecified: Secondary | ICD-10-CM | POA: Diagnosis not present

## 2019-04-11 DIAGNOSIS — N2581 Secondary hyperparathyroidism of renal origin: Secondary | ICD-10-CM | POA: Diagnosis not present

## 2019-04-12 DIAGNOSIS — N2581 Secondary hyperparathyroidism of renal origin: Secondary | ICD-10-CM | POA: Diagnosis not present

## 2019-04-12 DIAGNOSIS — Z992 Dependence on renal dialysis: Secondary | ICD-10-CM | POA: Diagnosis not present

## 2019-04-12 DIAGNOSIS — N186 End stage renal disease: Secondary | ICD-10-CM | POA: Diagnosis not present

## 2019-04-12 DIAGNOSIS — D509 Iron deficiency anemia, unspecified: Secondary | ICD-10-CM | POA: Diagnosis not present

## 2019-04-13 DIAGNOSIS — N2581 Secondary hyperparathyroidism of renal origin: Secondary | ICD-10-CM | POA: Diagnosis not present

## 2019-04-13 DIAGNOSIS — D509 Iron deficiency anemia, unspecified: Secondary | ICD-10-CM | POA: Diagnosis not present

## 2019-04-13 DIAGNOSIS — N186 End stage renal disease: Secondary | ICD-10-CM | POA: Diagnosis not present

## 2019-04-13 DIAGNOSIS — Z992 Dependence on renal dialysis: Secondary | ICD-10-CM | POA: Diagnosis not present

## 2019-04-14 DIAGNOSIS — Z992 Dependence on renal dialysis: Secondary | ICD-10-CM | POA: Diagnosis not present

## 2019-04-14 DIAGNOSIS — D509 Iron deficiency anemia, unspecified: Secondary | ICD-10-CM | POA: Diagnosis not present

## 2019-04-14 DIAGNOSIS — N2581 Secondary hyperparathyroidism of renal origin: Secondary | ICD-10-CM | POA: Diagnosis not present

## 2019-04-14 DIAGNOSIS — N186 End stage renal disease: Secondary | ICD-10-CM | POA: Diagnosis not present

## 2019-04-15 DIAGNOSIS — D509 Iron deficiency anemia, unspecified: Secondary | ICD-10-CM | POA: Diagnosis not present

## 2019-04-15 DIAGNOSIS — Z992 Dependence on renal dialysis: Secondary | ICD-10-CM | POA: Diagnosis not present

## 2019-04-15 DIAGNOSIS — N186 End stage renal disease: Secondary | ICD-10-CM | POA: Diagnosis not present

## 2019-04-15 DIAGNOSIS — N2581 Secondary hyperparathyroidism of renal origin: Secondary | ICD-10-CM | POA: Diagnosis not present

## 2019-04-16 DIAGNOSIS — N2581 Secondary hyperparathyroidism of renal origin: Secondary | ICD-10-CM | POA: Diagnosis not present

## 2019-04-16 DIAGNOSIS — D509 Iron deficiency anemia, unspecified: Secondary | ICD-10-CM | POA: Diagnosis not present

## 2019-04-16 DIAGNOSIS — Z992 Dependence on renal dialysis: Secondary | ICD-10-CM | POA: Diagnosis not present

## 2019-04-16 DIAGNOSIS — N186 End stage renal disease: Secondary | ICD-10-CM | POA: Diagnosis not present

## 2019-04-17 DIAGNOSIS — D509 Iron deficiency anemia, unspecified: Secondary | ICD-10-CM | POA: Diagnosis not present

## 2019-04-17 DIAGNOSIS — Z992 Dependence on renal dialysis: Secondary | ICD-10-CM | POA: Diagnosis not present

## 2019-04-17 DIAGNOSIS — N186 End stage renal disease: Secondary | ICD-10-CM | POA: Diagnosis not present

## 2019-04-17 DIAGNOSIS — N2581 Secondary hyperparathyroidism of renal origin: Secondary | ICD-10-CM | POA: Diagnosis not present

## 2019-04-18 DIAGNOSIS — N186 End stage renal disease: Secondary | ICD-10-CM | POA: Diagnosis not present

## 2019-04-18 DIAGNOSIS — Z992 Dependence on renal dialysis: Secondary | ICD-10-CM | POA: Diagnosis not present

## 2019-04-18 DIAGNOSIS — N2581 Secondary hyperparathyroidism of renal origin: Secondary | ICD-10-CM | POA: Diagnosis not present

## 2019-04-18 DIAGNOSIS — D509 Iron deficiency anemia, unspecified: Secondary | ICD-10-CM | POA: Diagnosis not present

## 2019-04-19 DIAGNOSIS — N186 End stage renal disease: Secondary | ICD-10-CM | POA: Diagnosis not present

## 2019-04-19 DIAGNOSIS — D509 Iron deficiency anemia, unspecified: Secondary | ICD-10-CM | POA: Diagnosis not present

## 2019-04-19 DIAGNOSIS — Z992 Dependence on renal dialysis: Secondary | ICD-10-CM | POA: Diagnosis not present

## 2019-04-19 DIAGNOSIS — N2581 Secondary hyperparathyroidism of renal origin: Secondary | ICD-10-CM | POA: Diagnosis not present

## 2019-04-20 DIAGNOSIS — N2581 Secondary hyperparathyroidism of renal origin: Secondary | ICD-10-CM | POA: Diagnosis not present

## 2019-04-20 DIAGNOSIS — D509 Iron deficiency anemia, unspecified: Secondary | ICD-10-CM | POA: Diagnosis not present

## 2019-04-20 DIAGNOSIS — N186 End stage renal disease: Secondary | ICD-10-CM | POA: Diagnosis not present

## 2019-04-20 DIAGNOSIS — Z992 Dependence on renal dialysis: Secondary | ICD-10-CM | POA: Diagnosis not present

## 2019-04-21 DIAGNOSIS — D509 Iron deficiency anemia, unspecified: Secondary | ICD-10-CM | POA: Diagnosis not present

## 2019-04-21 DIAGNOSIS — N2581 Secondary hyperparathyroidism of renal origin: Secondary | ICD-10-CM | POA: Diagnosis not present

## 2019-04-21 DIAGNOSIS — Z992 Dependence on renal dialysis: Secondary | ICD-10-CM | POA: Diagnosis not present

## 2019-04-21 DIAGNOSIS — N186 End stage renal disease: Secondary | ICD-10-CM | POA: Diagnosis not present

## 2019-04-22 DIAGNOSIS — D509 Iron deficiency anemia, unspecified: Secondary | ICD-10-CM | POA: Diagnosis not present

## 2019-04-22 DIAGNOSIS — N2581 Secondary hyperparathyroidism of renal origin: Secondary | ICD-10-CM | POA: Diagnosis not present

## 2019-04-22 DIAGNOSIS — N186 End stage renal disease: Secondary | ICD-10-CM | POA: Diagnosis not present

## 2019-04-22 DIAGNOSIS — Z992 Dependence on renal dialysis: Secondary | ICD-10-CM | POA: Diagnosis not present

## 2019-04-23 DIAGNOSIS — N186 End stage renal disease: Secondary | ICD-10-CM | POA: Diagnosis not present

## 2019-04-23 DIAGNOSIS — D509 Iron deficiency anemia, unspecified: Secondary | ICD-10-CM | POA: Diagnosis not present

## 2019-04-23 DIAGNOSIS — N2581 Secondary hyperparathyroidism of renal origin: Secondary | ICD-10-CM | POA: Diagnosis not present

## 2019-04-23 DIAGNOSIS — Z992 Dependence on renal dialysis: Secondary | ICD-10-CM | POA: Diagnosis not present

## 2019-04-24 DIAGNOSIS — D509 Iron deficiency anemia, unspecified: Secondary | ICD-10-CM | POA: Diagnosis not present

## 2019-04-24 DIAGNOSIS — N186 End stage renal disease: Secondary | ICD-10-CM | POA: Diagnosis not present

## 2019-04-24 DIAGNOSIS — N2581 Secondary hyperparathyroidism of renal origin: Secondary | ICD-10-CM | POA: Diagnosis not present

## 2019-04-24 DIAGNOSIS — Z992 Dependence on renal dialysis: Secondary | ICD-10-CM | POA: Diagnosis not present

## 2019-04-25 DIAGNOSIS — N186 End stage renal disease: Secondary | ICD-10-CM | POA: Diagnosis not present

## 2019-04-25 DIAGNOSIS — Z992 Dependence on renal dialysis: Secondary | ICD-10-CM | POA: Diagnosis not present

## 2019-04-25 DIAGNOSIS — D509 Iron deficiency anemia, unspecified: Secondary | ICD-10-CM | POA: Diagnosis not present

## 2019-04-25 DIAGNOSIS — N2581 Secondary hyperparathyroidism of renal origin: Secondary | ICD-10-CM | POA: Diagnosis not present

## 2019-04-26 DIAGNOSIS — N186 End stage renal disease: Secondary | ICD-10-CM | POA: Diagnosis not present

## 2019-04-26 DIAGNOSIS — Z992 Dependence on renal dialysis: Secondary | ICD-10-CM | POA: Diagnosis not present

## 2019-04-26 DIAGNOSIS — N2581 Secondary hyperparathyroidism of renal origin: Secondary | ICD-10-CM | POA: Diagnosis not present

## 2019-04-26 DIAGNOSIS — D509 Iron deficiency anemia, unspecified: Secondary | ICD-10-CM | POA: Diagnosis not present

## 2019-04-27 DIAGNOSIS — N2581 Secondary hyperparathyroidism of renal origin: Secondary | ICD-10-CM | POA: Diagnosis not present

## 2019-04-27 DIAGNOSIS — Z992 Dependence on renal dialysis: Secondary | ICD-10-CM | POA: Diagnosis not present

## 2019-04-27 DIAGNOSIS — D509 Iron deficiency anemia, unspecified: Secondary | ICD-10-CM | POA: Diagnosis not present

## 2019-04-27 DIAGNOSIS — N186 End stage renal disease: Secondary | ICD-10-CM | POA: Diagnosis not present

## 2019-04-28 DIAGNOSIS — N2581 Secondary hyperparathyroidism of renal origin: Secondary | ICD-10-CM | POA: Diagnosis not present

## 2019-04-28 DIAGNOSIS — N186 End stage renal disease: Secondary | ICD-10-CM | POA: Diagnosis not present

## 2019-04-28 DIAGNOSIS — D509 Iron deficiency anemia, unspecified: Secondary | ICD-10-CM | POA: Diagnosis not present

## 2019-04-28 DIAGNOSIS — Z992 Dependence on renal dialysis: Secondary | ICD-10-CM | POA: Diagnosis not present

## 2019-04-29 DIAGNOSIS — N186 End stage renal disease: Secondary | ICD-10-CM | POA: Diagnosis not present

## 2019-04-29 DIAGNOSIS — N2581 Secondary hyperparathyroidism of renal origin: Secondary | ICD-10-CM | POA: Diagnosis not present

## 2019-04-29 DIAGNOSIS — Z992 Dependence on renal dialysis: Secondary | ICD-10-CM | POA: Diagnosis not present

## 2019-04-29 DIAGNOSIS — D509 Iron deficiency anemia, unspecified: Secondary | ICD-10-CM | POA: Diagnosis not present

## 2019-04-30 DIAGNOSIS — N2581 Secondary hyperparathyroidism of renal origin: Secondary | ICD-10-CM | POA: Diagnosis not present

## 2019-04-30 DIAGNOSIS — N186 End stage renal disease: Secondary | ICD-10-CM | POA: Diagnosis not present

## 2019-04-30 DIAGNOSIS — Z992 Dependence on renal dialysis: Secondary | ICD-10-CM | POA: Diagnosis not present

## 2019-04-30 DIAGNOSIS — D509 Iron deficiency anemia, unspecified: Secondary | ICD-10-CM | POA: Diagnosis not present

## 2019-05-01 DIAGNOSIS — N186 End stage renal disease: Secondary | ICD-10-CM | POA: Diagnosis not present

## 2019-05-01 DIAGNOSIS — N2581 Secondary hyperparathyroidism of renal origin: Secondary | ICD-10-CM | POA: Diagnosis not present

## 2019-05-01 DIAGNOSIS — Z992 Dependence on renal dialysis: Secondary | ICD-10-CM | POA: Diagnosis not present

## 2019-05-01 DIAGNOSIS — D509 Iron deficiency anemia, unspecified: Secondary | ICD-10-CM | POA: Diagnosis not present

## 2019-05-02 DIAGNOSIS — D509 Iron deficiency anemia, unspecified: Secondary | ICD-10-CM | POA: Diagnosis not present

## 2019-05-02 DIAGNOSIS — Z992 Dependence on renal dialysis: Secondary | ICD-10-CM | POA: Diagnosis not present

## 2019-05-02 DIAGNOSIS — N2581 Secondary hyperparathyroidism of renal origin: Secondary | ICD-10-CM | POA: Diagnosis not present

## 2019-05-02 DIAGNOSIS — N186 End stage renal disease: Secondary | ICD-10-CM | POA: Diagnosis not present

## 2019-05-03 DIAGNOSIS — N2581 Secondary hyperparathyroidism of renal origin: Secondary | ICD-10-CM | POA: Diagnosis not present

## 2019-05-03 DIAGNOSIS — N186 End stage renal disease: Secondary | ICD-10-CM | POA: Diagnosis not present

## 2019-05-03 DIAGNOSIS — D509 Iron deficiency anemia, unspecified: Secondary | ICD-10-CM | POA: Diagnosis not present

## 2019-05-03 DIAGNOSIS — Z992 Dependence on renal dialysis: Secondary | ICD-10-CM | POA: Diagnosis not present

## 2019-05-04 DIAGNOSIS — Z992 Dependence on renal dialysis: Secondary | ICD-10-CM | POA: Diagnosis not present

## 2019-05-04 DIAGNOSIS — D509 Iron deficiency anemia, unspecified: Secondary | ICD-10-CM | POA: Diagnosis not present

## 2019-05-04 DIAGNOSIS — N186 End stage renal disease: Secondary | ICD-10-CM | POA: Diagnosis not present

## 2019-05-04 DIAGNOSIS — N2581 Secondary hyperparathyroidism of renal origin: Secondary | ICD-10-CM | POA: Diagnosis not present

## 2019-05-05 DIAGNOSIS — N186 End stage renal disease: Secondary | ICD-10-CM | POA: Diagnosis not present

## 2019-05-05 DIAGNOSIS — Z992 Dependence on renal dialysis: Secondary | ICD-10-CM | POA: Diagnosis not present

## 2019-05-05 DIAGNOSIS — N2581 Secondary hyperparathyroidism of renal origin: Secondary | ICD-10-CM | POA: Diagnosis not present

## 2019-05-05 DIAGNOSIS — D509 Iron deficiency anemia, unspecified: Secondary | ICD-10-CM | POA: Diagnosis not present

## 2019-05-06 DIAGNOSIS — N2581 Secondary hyperparathyroidism of renal origin: Secondary | ICD-10-CM | POA: Diagnosis not present

## 2019-05-06 DIAGNOSIS — N186 End stage renal disease: Secondary | ICD-10-CM | POA: Diagnosis not present

## 2019-05-06 DIAGNOSIS — Z992 Dependence on renal dialysis: Secondary | ICD-10-CM | POA: Diagnosis not present

## 2019-05-06 DIAGNOSIS — D509 Iron deficiency anemia, unspecified: Secondary | ICD-10-CM | POA: Diagnosis not present

## 2019-05-07 DIAGNOSIS — N186 End stage renal disease: Secondary | ICD-10-CM | POA: Diagnosis not present

## 2019-05-07 DIAGNOSIS — Z992 Dependence on renal dialysis: Secondary | ICD-10-CM | POA: Diagnosis not present

## 2019-05-07 DIAGNOSIS — D509 Iron deficiency anemia, unspecified: Secondary | ICD-10-CM | POA: Diagnosis not present

## 2019-05-07 DIAGNOSIS — N2581 Secondary hyperparathyroidism of renal origin: Secondary | ICD-10-CM | POA: Diagnosis not present

## 2019-05-08 DIAGNOSIS — N186 End stage renal disease: Secondary | ICD-10-CM | POA: Diagnosis not present

## 2019-05-08 DIAGNOSIS — D509 Iron deficiency anemia, unspecified: Secondary | ICD-10-CM | POA: Diagnosis not present

## 2019-05-08 DIAGNOSIS — Z992 Dependence on renal dialysis: Secondary | ICD-10-CM | POA: Diagnosis not present

## 2019-05-08 DIAGNOSIS — N2581 Secondary hyperparathyroidism of renal origin: Secondary | ICD-10-CM | POA: Diagnosis not present

## 2019-05-09 DIAGNOSIS — N2581 Secondary hyperparathyroidism of renal origin: Secondary | ICD-10-CM | POA: Diagnosis not present

## 2019-05-09 DIAGNOSIS — N186 End stage renal disease: Secondary | ICD-10-CM | POA: Diagnosis not present

## 2019-05-09 DIAGNOSIS — Z992 Dependence on renal dialysis: Secondary | ICD-10-CM | POA: Diagnosis not present

## 2019-05-09 DIAGNOSIS — D509 Iron deficiency anemia, unspecified: Secondary | ICD-10-CM | POA: Diagnosis not present

## 2019-05-10 DIAGNOSIS — N2581 Secondary hyperparathyroidism of renal origin: Secondary | ICD-10-CM | POA: Diagnosis not present

## 2019-05-10 DIAGNOSIS — Z992 Dependence on renal dialysis: Secondary | ICD-10-CM | POA: Diagnosis not present

## 2019-05-10 DIAGNOSIS — N186 End stage renal disease: Secondary | ICD-10-CM | POA: Diagnosis not present

## 2019-05-10 DIAGNOSIS — D509 Iron deficiency anemia, unspecified: Secondary | ICD-10-CM | POA: Diagnosis not present

## 2019-05-11 DIAGNOSIS — Z992 Dependence on renal dialysis: Secondary | ICD-10-CM | POA: Diagnosis not present

## 2019-05-11 DIAGNOSIS — N2581 Secondary hyperparathyroidism of renal origin: Secondary | ICD-10-CM | POA: Diagnosis not present

## 2019-05-11 DIAGNOSIS — N186 End stage renal disease: Secondary | ICD-10-CM | POA: Diagnosis not present

## 2019-05-11 DIAGNOSIS — D509 Iron deficiency anemia, unspecified: Secondary | ICD-10-CM | POA: Diagnosis not present

## 2019-05-12 DIAGNOSIS — Z992 Dependence on renal dialysis: Secondary | ICD-10-CM | POA: Diagnosis not present

## 2019-05-12 DIAGNOSIS — N186 End stage renal disease: Secondary | ICD-10-CM | POA: Diagnosis not present

## 2019-05-12 DIAGNOSIS — D509 Iron deficiency anemia, unspecified: Secondary | ICD-10-CM | POA: Diagnosis not present

## 2019-05-12 DIAGNOSIS — N2581 Secondary hyperparathyroidism of renal origin: Secondary | ICD-10-CM | POA: Diagnosis not present

## 2019-05-13 DIAGNOSIS — N186 End stage renal disease: Secondary | ICD-10-CM | POA: Diagnosis not present

## 2019-05-13 DIAGNOSIS — D509 Iron deficiency anemia, unspecified: Secondary | ICD-10-CM | POA: Diagnosis not present

## 2019-05-13 DIAGNOSIS — N2581 Secondary hyperparathyroidism of renal origin: Secondary | ICD-10-CM | POA: Diagnosis not present

## 2019-05-13 DIAGNOSIS — Z992 Dependence on renal dialysis: Secondary | ICD-10-CM | POA: Diagnosis not present

## 2019-05-14 DIAGNOSIS — N2581 Secondary hyperparathyroidism of renal origin: Secondary | ICD-10-CM | POA: Diagnosis not present

## 2019-05-14 DIAGNOSIS — N186 End stage renal disease: Secondary | ICD-10-CM | POA: Diagnosis not present

## 2019-05-14 DIAGNOSIS — D509 Iron deficiency anemia, unspecified: Secondary | ICD-10-CM | POA: Diagnosis not present

## 2019-05-14 DIAGNOSIS — Z992 Dependence on renal dialysis: Secondary | ICD-10-CM | POA: Diagnosis not present

## 2019-05-15 DIAGNOSIS — N2581 Secondary hyperparathyroidism of renal origin: Secondary | ICD-10-CM | POA: Diagnosis not present

## 2019-05-15 DIAGNOSIS — D509 Iron deficiency anemia, unspecified: Secondary | ICD-10-CM | POA: Diagnosis not present

## 2019-05-15 DIAGNOSIS — N186 End stage renal disease: Secondary | ICD-10-CM | POA: Diagnosis not present

## 2019-05-15 DIAGNOSIS — Z992 Dependence on renal dialysis: Secondary | ICD-10-CM | POA: Diagnosis not present

## 2019-05-16 DIAGNOSIS — D509 Iron deficiency anemia, unspecified: Secondary | ICD-10-CM | POA: Diagnosis not present

## 2019-05-16 DIAGNOSIS — N186 End stage renal disease: Secondary | ICD-10-CM | POA: Diagnosis not present

## 2019-05-16 DIAGNOSIS — Z992 Dependence on renal dialysis: Secondary | ICD-10-CM | POA: Diagnosis not present

## 2019-05-16 DIAGNOSIS — N2581 Secondary hyperparathyroidism of renal origin: Secondary | ICD-10-CM | POA: Diagnosis not present

## 2019-05-17 DIAGNOSIS — N186 End stage renal disease: Secondary | ICD-10-CM | POA: Diagnosis not present

## 2019-05-17 DIAGNOSIS — Z992 Dependence on renal dialysis: Secondary | ICD-10-CM | POA: Diagnosis not present

## 2019-05-17 DIAGNOSIS — N2581 Secondary hyperparathyroidism of renal origin: Secondary | ICD-10-CM | POA: Diagnosis not present

## 2019-05-17 DIAGNOSIS — D509 Iron deficiency anemia, unspecified: Secondary | ICD-10-CM | POA: Diagnosis not present

## 2019-05-18 DIAGNOSIS — N186 End stage renal disease: Secondary | ICD-10-CM | POA: Diagnosis not present

## 2019-05-18 DIAGNOSIS — D509 Iron deficiency anemia, unspecified: Secondary | ICD-10-CM | POA: Diagnosis not present

## 2019-05-18 DIAGNOSIS — N2581 Secondary hyperparathyroidism of renal origin: Secondary | ICD-10-CM | POA: Diagnosis not present

## 2019-05-18 DIAGNOSIS — Z992 Dependence on renal dialysis: Secondary | ICD-10-CM | POA: Diagnosis not present

## 2019-05-19 DIAGNOSIS — Z992 Dependence on renal dialysis: Secondary | ICD-10-CM | POA: Diagnosis not present

## 2019-05-19 DIAGNOSIS — D509 Iron deficiency anemia, unspecified: Secondary | ICD-10-CM | POA: Diagnosis not present

## 2019-05-19 DIAGNOSIS — N186 End stage renal disease: Secondary | ICD-10-CM | POA: Diagnosis not present

## 2019-05-19 DIAGNOSIS — N2581 Secondary hyperparathyroidism of renal origin: Secondary | ICD-10-CM | POA: Diagnosis not present

## 2019-05-20 DIAGNOSIS — N2581 Secondary hyperparathyroidism of renal origin: Secondary | ICD-10-CM | POA: Diagnosis not present

## 2019-05-20 DIAGNOSIS — D509 Iron deficiency anemia, unspecified: Secondary | ICD-10-CM | POA: Diagnosis not present

## 2019-05-20 DIAGNOSIS — Z992 Dependence on renal dialysis: Secondary | ICD-10-CM | POA: Diagnosis not present

## 2019-05-20 DIAGNOSIS — N186 End stage renal disease: Secondary | ICD-10-CM | POA: Diagnosis not present

## 2019-05-21 DIAGNOSIS — D509 Iron deficiency anemia, unspecified: Secondary | ICD-10-CM | POA: Diagnosis not present

## 2019-05-21 DIAGNOSIS — N186 End stage renal disease: Secondary | ICD-10-CM | POA: Diagnosis not present

## 2019-05-21 DIAGNOSIS — Z992 Dependence on renal dialysis: Secondary | ICD-10-CM | POA: Diagnosis not present

## 2019-05-21 DIAGNOSIS — N2581 Secondary hyperparathyroidism of renal origin: Secondary | ICD-10-CM | POA: Diagnosis not present

## 2019-05-22 DIAGNOSIS — Z992 Dependence on renal dialysis: Secondary | ICD-10-CM | POA: Diagnosis not present

## 2019-05-22 DIAGNOSIS — D509 Iron deficiency anemia, unspecified: Secondary | ICD-10-CM | POA: Diagnosis not present

## 2019-05-22 DIAGNOSIS — N186 End stage renal disease: Secondary | ICD-10-CM | POA: Diagnosis not present

## 2019-05-22 DIAGNOSIS — N2581 Secondary hyperparathyroidism of renal origin: Secondary | ICD-10-CM | POA: Diagnosis not present

## 2019-05-23 DIAGNOSIS — D509 Iron deficiency anemia, unspecified: Secondary | ICD-10-CM | POA: Diagnosis not present

## 2019-05-23 DIAGNOSIS — N186 End stage renal disease: Secondary | ICD-10-CM | POA: Diagnosis not present

## 2019-05-23 DIAGNOSIS — Z992 Dependence on renal dialysis: Secondary | ICD-10-CM | POA: Diagnosis not present

## 2019-05-24 DIAGNOSIS — N186 End stage renal disease: Secondary | ICD-10-CM | POA: Diagnosis not present

## 2019-05-24 DIAGNOSIS — D509 Iron deficiency anemia, unspecified: Secondary | ICD-10-CM | POA: Diagnosis not present

## 2019-05-24 DIAGNOSIS — Z992 Dependence on renal dialysis: Secondary | ICD-10-CM | POA: Diagnosis not present

## 2019-05-25 DIAGNOSIS — Z992 Dependence on renal dialysis: Secondary | ICD-10-CM | POA: Diagnosis not present

## 2019-05-25 DIAGNOSIS — D509 Iron deficiency anemia, unspecified: Secondary | ICD-10-CM | POA: Diagnosis not present

## 2019-05-25 DIAGNOSIS — N186 End stage renal disease: Secondary | ICD-10-CM | POA: Diagnosis not present

## 2019-05-26 DIAGNOSIS — Z992 Dependence on renal dialysis: Secondary | ICD-10-CM | POA: Diagnosis not present

## 2019-05-26 DIAGNOSIS — D509 Iron deficiency anemia, unspecified: Secondary | ICD-10-CM | POA: Diagnosis not present

## 2019-05-26 DIAGNOSIS — N186 End stage renal disease: Secondary | ICD-10-CM | POA: Diagnosis not present

## 2019-05-27 DIAGNOSIS — N186 End stage renal disease: Secondary | ICD-10-CM | POA: Diagnosis not present

## 2019-05-27 DIAGNOSIS — D509 Iron deficiency anemia, unspecified: Secondary | ICD-10-CM | POA: Diagnosis not present

## 2019-05-27 DIAGNOSIS — Z992 Dependence on renal dialysis: Secondary | ICD-10-CM | POA: Diagnosis not present

## 2019-05-28 DIAGNOSIS — D509 Iron deficiency anemia, unspecified: Secondary | ICD-10-CM | POA: Diagnosis not present

## 2019-05-28 DIAGNOSIS — Z992 Dependence on renal dialysis: Secondary | ICD-10-CM | POA: Diagnosis not present

## 2019-05-28 DIAGNOSIS — N186 End stage renal disease: Secondary | ICD-10-CM | POA: Diagnosis not present

## 2019-05-29 DIAGNOSIS — N186 End stage renal disease: Secondary | ICD-10-CM | POA: Diagnosis not present

## 2019-05-29 DIAGNOSIS — D509 Iron deficiency anemia, unspecified: Secondary | ICD-10-CM | POA: Diagnosis not present

## 2019-05-29 DIAGNOSIS — Z992 Dependence on renal dialysis: Secondary | ICD-10-CM | POA: Diagnosis not present

## 2019-05-30 DIAGNOSIS — D509 Iron deficiency anemia, unspecified: Secondary | ICD-10-CM | POA: Diagnosis not present

## 2019-05-30 DIAGNOSIS — Z992 Dependence on renal dialysis: Secondary | ICD-10-CM | POA: Diagnosis not present

## 2019-05-30 DIAGNOSIS — N186 End stage renal disease: Secondary | ICD-10-CM | POA: Diagnosis not present

## 2019-05-31 DIAGNOSIS — N186 End stage renal disease: Secondary | ICD-10-CM | POA: Diagnosis not present

## 2019-05-31 DIAGNOSIS — D509 Iron deficiency anemia, unspecified: Secondary | ICD-10-CM | POA: Diagnosis not present

## 2019-05-31 DIAGNOSIS — Z992 Dependence on renal dialysis: Secondary | ICD-10-CM | POA: Diagnosis not present

## 2019-06-01 DIAGNOSIS — Z992 Dependence on renal dialysis: Secondary | ICD-10-CM | POA: Diagnosis not present

## 2019-06-01 DIAGNOSIS — D509 Iron deficiency anemia, unspecified: Secondary | ICD-10-CM | POA: Diagnosis not present

## 2019-06-01 DIAGNOSIS — N186 End stage renal disease: Secondary | ICD-10-CM | POA: Diagnosis not present

## 2019-06-02 DIAGNOSIS — D509 Iron deficiency anemia, unspecified: Secondary | ICD-10-CM | POA: Diagnosis not present

## 2019-06-02 DIAGNOSIS — N186 End stage renal disease: Secondary | ICD-10-CM | POA: Diagnosis not present

## 2019-06-02 DIAGNOSIS — Z992 Dependence on renal dialysis: Secondary | ICD-10-CM | POA: Diagnosis not present

## 2019-06-03 DIAGNOSIS — N186 End stage renal disease: Secondary | ICD-10-CM | POA: Diagnosis not present

## 2019-06-03 DIAGNOSIS — Z992 Dependence on renal dialysis: Secondary | ICD-10-CM | POA: Diagnosis not present

## 2019-06-03 DIAGNOSIS — D509 Iron deficiency anemia, unspecified: Secondary | ICD-10-CM | POA: Diagnosis not present

## 2019-06-04 DIAGNOSIS — D509 Iron deficiency anemia, unspecified: Secondary | ICD-10-CM | POA: Diagnosis not present

## 2019-06-04 DIAGNOSIS — Z992 Dependence on renal dialysis: Secondary | ICD-10-CM | POA: Diagnosis not present

## 2019-06-04 DIAGNOSIS — N186 End stage renal disease: Secondary | ICD-10-CM | POA: Diagnosis not present

## 2019-06-05 DIAGNOSIS — N186 End stage renal disease: Secondary | ICD-10-CM | POA: Diagnosis not present

## 2019-06-05 DIAGNOSIS — Z992 Dependence on renal dialysis: Secondary | ICD-10-CM | POA: Diagnosis not present

## 2019-06-05 DIAGNOSIS — D509 Iron deficiency anemia, unspecified: Secondary | ICD-10-CM | POA: Diagnosis not present

## 2019-06-06 DIAGNOSIS — Z992 Dependence on renal dialysis: Secondary | ICD-10-CM | POA: Diagnosis not present

## 2019-06-06 DIAGNOSIS — D509 Iron deficiency anemia, unspecified: Secondary | ICD-10-CM | POA: Diagnosis not present

## 2019-06-06 DIAGNOSIS — N186 End stage renal disease: Secondary | ICD-10-CM | POA: Diagnosis not present

## 2019-06-07 DIAGNOSIS — Z992 Dependence on renal dialysis: Secondary | ICD-10-CM | POA: Diagnosis not present

## 2019-06-07 DIAGNOSIS — D509 Iron deficiency anemia, unspecified: Secondary | ICD-10-CM | POA: Diagnosis not present

## 2019-06-07 DIAGNOSIS — N186 End stage renal disease: Secondary | ICD-10-CM | POA: Diagnosis not present

## 2019-06-08 DIAGNOSIS — N186 End stage renal disease: Secondary | ICD-10-CM | POA: Diagnosis not present

## 2019-06-08 DIAGNOSIS — Z992 Dependence on renal dialysis: Secondary | ICD-10-CM | POA: Diagnosis not present

## 2019-06-08 DIAGNOSIS — D509 Iron deficiency anemia, unspecified: Secondary | ICD-10-CM | POA: Diagnosis not present

## 2019-06-09 DIAGNOSIS — Z992 Dependence on renal dialysis: Secondary | ICD-10-CM | POA: Diagnosis not present

## 2019-06-09 DIAGNOSIS — D509 Iron deficiency anemia, unspecified: Secondary | ICD-10-CM | POA: Diagnosis not present

## 2019-06-09 DIAGNOSIS — N186 End stage renal disease: Secondary | ICD-10-CM | POA: Diagnosis not present

## 2019-06-10 DIAGNOSIS — N186 End stage renal disease: Secondary | ICD-10-CM | POA: Diagnosis not present

## 2019-06-10 DIAGNOSIS — Z992 Dependence on renal dialysis: Secondary | ICD-10-CM | POA: Diagnosis not present

## 2019-06-10 DIAGNOSIS — D509 Iron deficiency anemia, unspecified: Secondary | ICD-10-CM | POA: Diagnosis not present

## 2019-06-11 DIAGNOSIS — D509 Iron deficiency anemia, unspecified: Secondary | ICD-10-CM | POA: Diagnosis not present

## 2019-06-11 DIAGNOSIS — N186 End stage renal disease: Secondary | ICD-10-CM | POA: Diagnosis not present

## 2019-06-11 DIAGNOSIS — Z992 Dependence on renal dialysis: Secondary | ICD-10-CM | POA: Diagnosis not present

## 2019-06-12 DIAGNOSIS — D509 Iron deficiency anemia, unspecified: Secondary | ICD-10-CM | POA: Diagnosis not present

## 2019-06-12 DIAGNOSIS — Z992 Dependence on renal dialysis: Secondary | ICD-10-CM | POA: Diagnosis not present

## 2019-06-12 DIAGNOSIS — N186 End stage renal disease: Secondary | ICD-10-CM | POA: Diagnosis not present

## 2019-06-13 DIAGNOSIS — D509 Iron deficiency anemia, unspecified: Secondary | ICD-10-CM | POA: Diagnosis not present

## 2019-06-13 DIAGNOSIS — Z992 Dependence on renal dialysis: Secondary | ICD-10-CM | POA: Diagnosis not present

## 2019-06-13 DIAGNOSIS — N186 End stage renal disease: Secondary | ICD-10-CM | POA: Diagnosis not present

## 2019-06-14 DIAGNOSIS — N186 End stage renal disease: Secondary | ICD-10-CM | POA: Diagnosis not present

## 2019-06-14 DIAGNOSIS — Z992 Dependence on renal dialysis: Secondary | ICD-10-CM | POA: Diagnosis not present

## 2019-06-14 DIAGNOSIS — D509 Iron deficiency anemia, unspecified: Secondary | ICD-10-CM | POA: Diagnosis not present

## 2019-06-15 DIAGNOSIS — Z992 Dependence on renal dialysis: Secondary | ICD-10-CM | POA: Diagnosis not present

## 2019-06-15 DIAGNOSIS — N186 End stage renal disease: Secondary | ICD-10-CM | POA: Diagnosis not present

## 2019-06-15 DIAGNOSIS — D509 Iron deficiency anemia, unspecified: Secondary | ICD-10-CM | POA: Diagnosis not present

## 2019-06-16 DIAGNOSIS — D509 Iron deficiency anemia, unspecified: Secondary | ICD-10-CM | POA: Diagnosis not present

## 2019-06-16 DIAGNOSIS — N186 End stage renal disease: Secondary | ICD-10-CM | POA: Diagnosis not present

## 2019-06-16 DIAGNOSIS — Z992 Dependence on renal dialysis: Secondary | ICD-10-CM | POA: Diagnosis not present

## 2019-06-17 DIAGNOSIS — N186 End stage renal disease: Secondary | ICD-10-CM | POA: Diagnosis not present

## 2019-06-17 DIAGNOSIS — D509 Iron deficiency anemia, unspecified: Secondary | ICD-10-CM | POA: Diagnosis not present

## 2019-06-17 DIAGNOSIS — Z992 Dependence on renal dialysis: Secondary | ICD-10-CM | POA: Diagnosis not present

## 2019-06-18 DIAGNOSIS — N186 End stage renal disease: Secondary | ICD-10-CM | POA: Diagnosis not present

## 2019-06-18 DIAGNOSIS — D509 Iron deficiency anemia, unspecified: Secondary | ICD-10-CM | POA: Diagnosis not present

## 2019-06-18 DIAGNOSIS — Z992 Dependence on renal dialysis: Secondary | ICD-10-CM | POA: Diagnosis not present

## 2019-06-19 DIAGNOSIS — Z992 Dependence on renal dialysis: Secondary | ICD-10-CM | POA: Diagnosis not present

## 2019-06-19 DIAGNOSIS — N186 End stage renal disease: Secondary | ICD-10-CM | POA: Diagnosis not present

## 2019-06-19 DIAGNOSIS — D509 Iron deficiency anemia, unspecified: Secondary | ICD-10-CM | POA: Diagnosis not present

## 2019-06-20 DIAGNOSIS — D509 Iron deficiency anemia, unspecified: Secondary | ICD-10-CM | POA: Diagnosis not present

## 2019-06-20 DIAGNOSIS — N186 End stage renal disease: Secondary | ICD-10-CM | POA: Diagnosis not present

## 2019-06-20 DIAGNOSIS — Z992 Dependence on renal dialysis: Secondary | ICD-10-CM | POA: Diagnosis not present

## 2019-06-21 DIAGNOSIS — D509 Iron deficiency anemia, unspecified: Secondary | ICD-10-CM | POA: Diagnosis not present

## 2019-06-21 DIAGNOSIS — Z992 Dependence on renal dialysis: Secondary | ICD-10-CM | POA: Diagnosis not present

## 2019-06-21 DIAGNOSIS — N186 End stage renal disease: Secondary | ICD-10-CM | POA: Diagnosis not present

## 2019-06-23 DIAGNOSIS — Z992 Dependence on renal dialysis: Secondary | ICD-10-CM | POA: Diagnosis not present

## 2019-07-01 DIAGNOSIS — N401 Enlarged prostate with lower urinary tract symptoms: Secondary | ICD-10-CM | POA: Diagnosis not present

## 2019-07-01 DIAGNOSIS — K219 Gastro-esophageal reflux disease without esophagitis: Secondary | ICD-10-CM | POA: Diagnosis not present

## 2019-07-01 DIAGNOSIS — N185 Chronic kidney disease, stage 5: Secondary | ICD-10-CM | POA: Diagnosis not present

## 2019-07-01 DIAGNOSIS — D508 Other iron deficiency anemias: Secondary | ICD-10-CM | POA: Diagnosis not present

## 2019-07-01 DIAGNOSIS — K2100 Gastro-esophageal reflux disease with esophagitis, without bleeding: Secondary | ICD-10-CM | POA: Diagnosis not present

## 2019-07-01 DIAGNOSIS — I10 Essential (primary) hypertension: Secondary | ICD-10-CM | POA: Diagnosis not present

## 2019-07-23 DIAGNOSIS — Z992 Dependence on renal dialysis: Secondary | ICD-10-CM | POA: Diagnosis not present

## 2019-07-23 DIAGNOSIS — N2581 Secondary hyperparathyroidism of renal origin: Secondary | ICD-10-CM | POA: Diagnosis not present

## 2019-07-23 DIAGNOSIS — N186 End stage renal disease: Secondary | ICD-10-CM | POA: Diagnosis not present

## 2019-07-23 DIAGNOSIS — D509 Iron deficiency anemia, unspecified: Secondary | ICD-10-CM | POA: Diagnosis not present

## 2019-07-24 DIAGNOSIS — D509 Iron deficiency anemia, unspecified: Secondary | ICD-10-CM | POA: Diagnosis not present

## 2019-07-24 DIAGNOSIS — N186 End stage renal disease: Secondary | ICD-10-CM | POA: Diagnosis not present

## 2019-07-24 DIAGNOSIS — N2581 Secondary hyperparathyroidism of renal origin: Secondary | ICD-10-CM | POA: Diagnosis not present

## 2019-07-24 DIAGNOSIS — Z992 Dependence on renal dialysis: Secondary | ICD-10-CM | POA: Diagnosis not present

## 2019-07-25 DIAGNOSIS — D509 Iron deficiency anemia, unspecified: Secondary | ICD-10-CM | POA: Diagnosis not present

## 2019-07-25 DIAGNOSIS — Z992 Dependence on renal dialysis: Secondary | ICD-10-CM | POA: Diagnosis not present

## 2019-07-25 DIAGNOSIS — N2581 Secondary hyperparathyroidism of renal origin: Secondary | ICD-10-CM | POA: Diagnosis not present

## 2019-07-25 DIAGNOSIS — N186 End stage renal disease: Secondary | ICD-10-CM | POA: Diagnosis not present

## 2019-07-26 DIAGNOSIS — N2581 Secondary hyperparathyroidism of renal origin: Secondary | ICD-10-CM | POA: Diagnosis not present

## 2019-07-26 DIAGNOSIS — N186 End stage renal disease: Secondary | ICD-10-CM | POA: Diagnosis not present

## 2019-07-26 DIAGNOSIS — D509 Iron deficiency anemia, unspecified: Secondary | ICD-10-CM | POA: Diagnosis not present

## 2019-07-26 DIAGNOSIS — Z992 Dependence on renal dialysis: Secondary | ICD-10-CM | POA: Diagnosis not present

## 2019-07-27 DIAGNOSIS — N186 End stage renal disease: Secondary | ICD-10-CM | POA: Diagnosis not present

## 2019-07-27 DIAGNOSIS — N2581 Secondary hyperparathyroidism of renal origin: Secondary | ICD-10-CM | POA: Diagnosis not present

## 2019-07-27 DIAGNOSIS — D509 Iron deficiency anemia, unspecified: Secondary | ICD-10-CM | POA: Diagnosis not present

## 2019-07-27 DIAGNOSIS — Z992 Dependence on renal dialysis: Secondary | ICD-10-CM | POA: Diagnosis not present

## 2019-07-28 DIAGNOSIS — N186 End stage renal disease: Secondary | ICD-10-CM | POA: Diagnosis not present

## 2019-07-28 DIAGNOSIS — N2581 Secondary hyperparathyroidism of renal origin: Secondary | ICD-10-CM | POA: Diagnosis not present

## 2019-07-28 DIAGNOSIS — D509 Iron deficiency anemia, unspecified: Secondary | ICD-10-CM | POA: Diagnosis not present

## 2019-07-28 DIAGNOSIS — Z992 Dependence on renal dialysis: Secondary | ICD-10-CM | POA: Diagnosis not present

## 2019-07-29 DIAGNOSIS — D509 Iron deficiency anemia, unspecified: Secondary | ICD-10-CM | POA: Diagnosis not present

## 2019-07-29 DIAGNOSIS — N186 End stage renal disease: Secondary | ICD-10-CM | POA: Diagnosis not present

## 2019-07-29 DIAGNOSIS — N2581 Secondary hyperparathyroidism of renal origin: Secondary | ICD-10-CM | POA: Diagnosis not present

## 2019-07-29 DIAGNOSIS — Z992 Dependence on renal dialysis: Secondary | ICD-10-CM | POA: Diagnosis not present

## 2019-07-30 DIAGNOSIS — N2581 Secondary hyperparathyroidism of renal origin: Secondary | ICD-10-CM | POA: Diagnosis not present

## 2019-07-30 DIAGNOSIS — D509 Iron deficiency anemia, unspecified: Secondary | ICD-10-CM | POA: Diagnosis not present

## 2019-07-30 DIAGNOSIS — N186 End stage renal disease: Secondary | ICD-10-CM | POA: Diagnosis not present

## 2019-07-30 DIAGNOSIS — Z992 Dependence on renal dialysis: Secondary | ICD-10-CM | POA: Diagnosis not present

## 2019-07-31 DIAGNOSIS — Z992 Dependence on renal dialysis: Secondary | ICD-10-CM | POA: Diagnosis not present

## 2019-07-31 DIAGNOSIS — N2581 Secondary hyperparathyroidism of renal origin: Secondary | ICD-10-CM | POA: Diagnosis not present

## 2019-07-31 DIAGNOSIS — D509 Iron deficiency anemia, unspecified: Secondary | ICD-10-CM | POA: Diagnosis not present

## 2019-07-31 DIAGNOSIS — N186 End stage renal disease: Secondary | ICD-10-CM | POA: Diagnosis not present

## 2019-08-01 DIAGNOSIS — N186 End stage renal disease: Secondary | ICD-10-CM | POA: Diagnosis not present

## 2019-08-01 DIAGNOSIS — Z992 Dependence on renal dialysis: Secondary | ICD-10-CM | POA: Diagnosis not present

## 2019-08-01 DIAGNOSIS — D509 Iron deficiency anemia, unspecified: Secondary | ICD-10-CM | POA: Diagnosis not present

## 2019-08-01 DIAGNOSIS — N2581 Secondary hyperparathyroidism of renal origin: Secondary | ICD-10-CM | POA: Diagnosis not present

## 2019-08-02 DIAGNOSIS — D509 Iron deficiency anemia, unspecified: Secondary | ICD-10-CM | POA: Diagnosis not present

## 2019-08-02 DIAGNOSIS — N2581 Secondary hyperparathyroidism of renal origin: Secondary | ICD-10-CM | POA: Diagnosis not present

## 2019-08-02 DIAGNOSIS — N186 End stage renal disease: Secondary | ICD-10-CM | POA: Diagnosis not present

## 2019-08-02 DIAGNOSIS — Z992 Dependence on renal dialysis: Secondary | ICD-10-CM | POA: Diagnosis not present

## 2019-08-03 DIAGNOSIS — N2581 Secondary hyperparathyroidism of renal origin: Secondary | ICD-10-CM | POA: Diagnosis not present

## 2019-08-03 DIAGNOSIS — D509 Iron deficiency anemia, unspecified: Secondary | ICD-10-CM | POA: Diagnosis not present

## 2019-08-03 DIAGNOSIS — Z992 Dependence on renal dialysis: Secondary | ICD-10-CM | POA: Diagnosis not present

## 2019-08-03 DIAGNOSIS — N186 End stage renal disease: Secondary | ICD-10-CM | POA: Diagnosis not present

## 2019-08-04 DIAGNOSIS — D509 Iron deficiency anemia, unspecified: Secondary | ICD-10-CM | POA: Diagnosis not present

## 2019-08-04 DIAGNOSIS — N186 End stage renal disease: Secondary | ICD-10-CM | POA: Diagnosis not present

## 2019-08-04 DIAGNOSIS — Z992 Dependence on renal dialysis: Secondary | ICD-10-CM | POA: Diagnosis not present

## 2019-08-04 DIAGNOSIS — N2581 Secondary hyperparathyroidism of renal origin: Secondary | ICD-10-CM | POA: Diagnosis not present

## 2019-08-05 DIAGNOSIS — D509 Iron deficiency anemia, unspecified: Secondary | ICD-10-CM | POA: Diagnosis not present

## 2019-08-05 DIAGNOSIS — N186 End stage renal disease: Secondary | ICD-10-CM | POA: Diagnosis not present

## 2019-08-05 DIAGNOSIS — N2581 Secondary hyperparathyroidism of renal origin: Secondary | ICD-10-CM | POA: Diagnosis not present

## 2019-08-05 DIAGNOSIS — Z992 Dependence on renal dialysis: Secondary | ICD-10-CM | POA: Diagnosis not present

## 2019-08-06 DIAGNOSIS — N2581 Secondary hyperparathyroidism of renal origin: Secondary | ICD-10-CM | POA: Diagnosis not present

## 2019-08-06 DIAGNOSIS — Z992 Dependence on renal dialysis: Secondary | ICD-10-CM | POA: Diagnosis not present

## 2019-08-06 DIAGNOSIS — N186 End stage renal disease: Secondary | ICD-10-CM | POA: Diagnosis not present

## 2019-08-06 DIAGNOSIS — D509 Iron deficiency anemia, unspecified: Secondary | ICD-10-CM | POA: Diagnosis not present

## 2019-08-07 DIAGNOSIS — N186 End stage renal disease: Secondary | ICD-10-CM | POA: Diagnosis not present

## 2019-08-07 DIAGNOSIS — N2581 Secondary hyperparathyroidism of renal origin: Secondary | ICD-10-CM | POA: Diagnosis not present

## 2019-08-07 DIAGNOSIS — D509 Iron deficiency anemia, unspecified: Secondary | ICD-10-CM | POA: Diagnosis not present

## 2019-08-07 DIAGNOSIS — Z992 Dependence on renal dialysis: Secondary | ICD-10-CM | POA: Diagnosis not present

## 2019-08-08 DIAGNOSIS — N186 End stage renal disease: Secondary | ICD-10-CM | POA: Diagnosis not present

## 2019-08-08 DIAGNOSIS — N2581 Secondary hyperparathyroidism of renal origin: Secondary | ICD-10-CM | POA: Diagnosis not present

## 2019-08-08 DIAGNOSIS — D509 Iron deficiency anemia, unspecified: Secondary | ICD-10-CM | POA: Diagnosis not present

## 2019-08-08 DIAGNOSIS — Z992 Dependence on renal dialysis: Secondary | ICD-10-CM | POA: Diagnosis not present

## 2019-08-09 DIAGNOSIS — N2581 Secondary hyperparathyroidism of renal origin: Secondary | ICD-10-CM | POA: Diagnosis not present

## 2019-08-09 DIAGNOSIS — Z992 Dependence on renal dialysis: Secondary | ICD-10-CM | POA: Diagnosis not present

## 2019-08-09 DIAGNOSIS — N186 End stage renal disease: Secondary | ICD-10-CM | POA: Diagnosis not present

## 2019-08-09 DIAGNOSIS — D509 Iron deficiency anemia, unspecified: Secondary | ICD-10-CM | POA: Diagnosis not present

## 2019-08-10 DIAGNOSIS — N186 End stage renal disease: Secondary | ICD-10-CM | POA: Diagnosis not present

## 2019-08-10 DIAGNOSIS — N2581 Secondary hyperparathyroidism of renal origin: Secondary | ICD-10-CM | POA: Diagnosis not present

## 2019-08-10 DIAGNOSIS — Z992 Dependence on renal dialysis: Secondary | ICD-10-CM | POA: Diagnosis not present

## 2019-08-10 DIAGNOSIS — D509 Iron deficiency anemia, unspecified: Secondary | ICD-10-CM | POA: Diagnosis not present

## 2019-08-11 DIAGNOSIS — N186 End stage renal disease: Secondary | ICD-10-CM | POA: Diagnosis not present

## 2019-08-11 DIAGNOSIS — D509 Iron deficiency anemia, unspecified: Secondary | ICD-10-CM | POA: Diagnosis not present

## 2019-08-11 DIAGNOSIS — Z992 Dependence on renal dialysis: Secondary | ICD-10-CM | POA: Diagnosis not present

## 2019-08-11 DIAGNOSIS — N2581 Secondary hyperparathyroidism of renal origin: Secondary | ICD-10-CM | POA: Diagnosis not present

## 2019-08-12 DIAGNOSIS — D509 Iron deficiency anemia, unspecified: Secondary | ICD-10-CM | POA: Diagnosis not present

## 2019-08-12 DIAGNOSIS — N186 End stage renal disease: Secondary | ICD-10-CM | POA: Diagnosis not present

## 2019-08-12 DIAGNOSIS — Z992 Dependence on renal dialysis: Secondary | ICD-10-CM | POA: Diagnosis not present

## 2019-08-12 DIAGNOSIS — N2581 Secondary hyperparathyroidism of renal origin: Secondary | ICD-10-CM | POA: Diagnosis not present

## 2019-08-13 DIAGNOSIS — N186 End stage renal disease: Secondary | ICD-10-CM | POA: Diagnosis not present

## 2019-08-13 DIAGNOSIS — N2581 Secondary hyperparathyroidism of renal origin: Secondary | ICD-10-CM | POA: Diagnosis not present

## 2019-08-13 DIAGNOSIS — D509 Iron deficiency anemia, unspecified: Secondary | ICD-10-CM | POA: Diagnosis not present

## 2019-08-13 DIAGNOSIS — Z992 Dependence on renal dialysis: Secondary | ICD-10-CM | POA: Diagnosis not present

## 2019-08-14 DIAGNOSIS — D509 Iron deficiency anemia, unspecified: Secondary | ICD-10-CM | POA: Diagnosis not present

## 2019-08-14 DIAGNOSIS — N2581 Secondary hyperparathyroidism of renal origin: Secondary | ICD-10-CM | POA: Diagnosis not present

## 2019-08-14 DIAGNOSIS — N186 End stage renal disease: Secondary | ICD-10-CM | POA: Diagnosis not present

## 2019-08-14 DIAGNOSIS — Z992 Dependence on renal dialysis: Secondary | ICD-10-CM | POA: Diagnosis not present

## 2019-08-15 DIAGNOSIS — Z992 Dependence on renal dialysis: Secondary | ICD-10-CM | POA: Diagnosis not present

## 2019-08-15 DIAGNOSIS — N186 End stage renal disease: Secondary | ICD-10-CM | POA: Diagnosis not present

## 2019-08-15 DIAGNOSIS — N2581 Secondary hyperparathyroidism of renal origin: Secondary | ICD-10-CM | POA: Diagnosis not present

## 2019-08-15 DIAGNOSIS — D509 Iron deficiency anemia, unspecified: Secondary | ICD-10-CM | POA: Diagnosis not present

## 2019-08-16 DIAGNOSIS — D509 Iron deficiency anemia, unspecified: Secondary | ICD-10-CM | POA: Diagnosis not present

## 2019-08-16 DIAGNOSIS — N2581 Secondary hyperparathyroidism of renal origin: Secondary | ICD-10-CM | POA: Diagnosis not present

## 2019-08-16 DIAGNOSIS — Z992 Dependence on renal dialysis: Secondary | ICD-10-CM | POA: Diagnosis not present

## 2019-08-16 DIAGNOSIS — N186 End stage renal disease: Secondary | ICD-10-CM | POA: Diagnosis not present

## 2019-08-17 DIAGNOSIS — D509 Iron deficiency anemia, unspecified: Secondary | ICD-10-CM | POA: Diagnosis not present

## 2019-08-17 DIAGNOSIS — N2581 Secondary hyperparathyroidism of renal origin: Secondary | ICD-10-CM | POA: Diagnosis not present

## 2019-08-17 DIAGNOSIS — N186 End stage renal disease: Secondary | ICD-10-CM | POA: Diagnosis not present

## 2019-08-17 DIAGNOSIS — Z992 Dependence on renal dialysis: Secondary | ICD-10-CM | POA: Diagnosis not present

## 2019-08-18 DIAGNOSIS — N186 End stage renal disease: Secondary | ICD-10-CM | POA: Diagnosis not present

## 2019-08-18 DIAGNOSIS — D509 Iron deficiency anemia, unspecified: Secondary | ICD-10-CM | POA: Diagnosis not present

## 2019-08-18 DIAGNOSIS — Z992 Dependence on renal dialysis: Secondary | ICD-10-CM | POA: Diagnosis not present

## 2019-08-18 DIAGNOSIS — N2581 Secondary hyperparathyroidism of renal origin: Secondary | ICD-10-CM | POA: Diagnosis not present

## 2019-08-19 DIAGNOSIS — Z992 Dependence on renal dialysis: Secondary | ICD-10-CM | POA: Diagnosis not present

## 2019-08-19 DIAGNOSIS — N186 End stage renal disease: Secondary | ICD-10-CM | POA: Diagnosis not present

## 2019-08-19 DIAGNOSIS — D509 Iron deficiency anemia, unspecified: Secondary | ICD-10-CM | POA: Diagnosis not present

## 2019-08-19 DIAGNOSIS — N2581 Secondary hyperparathyroidism of renal origin: Secondary | ICD-10-CM | POA: Diagnosis not present

## 2019-08-20 DIAGNOSIS — N2581 Secondary hyperparathyroidism of renal origin: Secondary | ICD-10-CM | POA: Diagnosis not present

## 2019-08-20 DIAGNOSIS — D509 Iron deficiency anemia, unspecified: Secondary | ICD-10-CM | POA: Diagnosis not present

## 2019-08-20 DIAGNOSIS — Z992 Dependence on renal dialysis: Secondary | ICD-10-CM | POA: Diagnosis not present

## 2019-08-20 DIAGNOSIS — N186 End stage renal disease: Secondary | ICD-10-CM | POA: Diagnosis not present

## 2019-08-21 DIAGNOSIS — D509 Iron deficiency anemia, unspecified: Secondary | ICD-10-CM | POA: Diagnosis not present

## 2019-08-21 DIAGNOSIS — N2581 Secondary hyperparathyroidism of renal origin: Secondary | ICD-10-CM | POA: Diagnosis not present

## 2019-08-21 DIAGNOSIS — Z992 Dependence on renal dialysis: Secondary | ICD-10-CM | POA: Diagnosis not present

## 2019-08-21 DIAGNOSIS — N186 End stage renal disease: Secondary | ICD-10-CM | POA: Diagnosis not present

## 2019-08-22 DIAGNOSIS — Z992 Dependence on renal dialysis: Secondary | ICD-10-CM | POA: Diagnosis not present

## 2019-08-22 DIAGNOSIS — N2581 Secondary hyperparathyroidism of renal origin: Secondary | ICD-10-CM | POA: Diagnosis not present

## 2019-08-22 DIAGNOSIS — N186 End stage renal disease: Secondary | ICD-10-CM | POA: Diagnosis not present

## 2019-08-22 DIAGNOSIS — D509 Iron deficiency anemia, unspecified: Secondary | ICD-10-CM | POA: Diagnosis not present

## 2019-08-23 DIAGNOSIS — D509 Iron deficiency anemia, unspecified: Secondary | ICD-10-CM | POA: Diagnosis not present

## 2019-08-23 DIAGNOSIS — Z992 Dependence on renal dialysis: Secondary | ICD-10-CM | POA: Diagnosis not present

## 2019-08-23 DIAGNOSIS — N186 End stage renal disease: Secondary | ICD-10-CM | POA: Diagnosis not present

## 2019-08-24 DIAGNOSIS — D509 Iron deficiency anemia, unspecified: Secondary | ICD-10-CM | POA: Diagnosis not present

## 2019-08-24 DIAGNOSIS — Z992 Dependence on renal dialysis: Secondary | ICD-10-CM | POA: Diagnosis not present

## 2019-08-24 DIAGNOSIS — N186 End stage renal disease: Secondary | ICD-10-CM | POA: Diagnosis not present

## 2019-08-25 DIAGNOSIS — Z992 Dependence on renal dialysis: Secondary | ICD-10-CM | POA: Diagnosis not present

## 2019-08-25 DIAGNOSIS — N186 End stage renal disease: Secondary | ICD-10-CM | POA: Diagnosis not present

## 2019-08-25 DIAGNOSIS — D509 Iron deficiency anemia, unspecified: Secondary | ICD-10-CM | POA: Diagnosis not present

## 2019-08-26 DIAGNOSIS — N186 End stage renal disease: Secondary | ICD-10-CM | POA: Diagnosis not present

## 2019-08-26 DIAGNOSIS — Z992 Dependence on renal dialysis: Secondary | ICD-10-CM | POA: Diagnosis not present

## 2019-08-26 DIAGNOSIS — D509 Iron deficiency anemia, unspecified: Secondary | ICD-10-CM | POA: Diagnosis not present

## 2019-08-27 DIAGNOSIS — D509 Iron deficiency anemia, unspecified: Secondary | ICD-10-CM | POA: Diagnosis not present

## 2019-08-27 DIAGNOSIS — N186 End stage renal disease: Secondary | ICD-10-CM | POA: Diagnosis not present

## 2019-08-27 DIAGNOSIS — Z992 Dependence on renal dialysis: Secondary | ICD-10-CM | POA: Diagnosis not present

## 2019-08-28 DIAGNOSIS — N186 End stage renal disease: Secondary | ICD-10-CM | POA: Diagnosis not present

## 2019-08-28 DIAGNOSIS — Z992 Dependence on renal dialysis: Secondary | ICD-10-CM | POA: Diagnosis not present

## 2019-08-28 DIAGNOSIS — D509 Iron deficiency anemia, unspecified: Secondary | ICD-10-CM | POA: Diagnosis not present

## 2019-08-29 DIAGNOSIS — D509 Iron deficiency anemia, unspecified: Secondary | ICD-10-CM | POA: Diagnosis not present

## 2019-08-29 DIAGNOSIS — Z992 Dependence on renal dialysis: Secondary | ICD-10-CM | POA: Diagnosis not present

## 2019-08-29 DIAGNOSIS — N186 End stage renal disease: Secondary | ICD-10-CM | POA: Diagnosis not present

## 2019-08-30 DIAGNOSIS — N186 End stage renal disease: Secondary | ICD-10-CM | POA: Diagnosis not present

## 2019-08-30 DIAGNOSIS — D509 Iron deficiency anemia, unspecified: Secondary | ICD-10-CM | POA: Diagnosis not present

## 2019-08-30 DIAGNOSIS — Z992 Dependence on renal dialysis: Secondary | ICD-10-CM | POA: Diagnosis not present

## 2019-08-31 DIAGNOSIS — N186 End stage renal disease: Secondary | ICD-10-CM | POA: Diagnosis not present

## 2019-08-31 DIAGNOSIS — D509 Iron deficiency anemia, unspecified: Secondary | ICD-10-CM | POA: Diagnosis not present

## 2019-08-31 DIAGNOSIS — Z992 Dependence on renal dialysis: Secondary | ICD-10-CM | POA: Diagnosis not present

## 2019-09-01 DIAGNOSIS — N186 End stage renal disease: Secondary | ICD-10-CM | POA: Diagnosis not present

## 2019-09-01 DIAGNOSIS — D509 Iron deficiency anemia, unspecified: Secondary | ICD-10-CM | POA: Diagnosis not present

## 2019-09-01 DIAGNOSIS — Z992 Dependence on renal dialysis: Secondary | ICD-10-CM | POA: Diagnosis not present

## 2019-09-02 DIAGNOSIS — N186 End stage renal disease: Secondary | ICD-10-CM | POA: Diagnosis not present

## 2019-09-02 DIAGNOSIS — Z992 Dependence on renal dialysis: Secondary | ICD-10-CM | POA: Diagnosis not present

## 2019-09-02 DIAGNOSIS — D509 Iron deficiency anemia, unspecified: Secondary | ICD-10-CM | POA: Diagnosis not present

## 2019-09-03 DIAGNOSIS — N186 End stage renal disease: Secondary | ICD-10-CM | POA: Diagnosis not present

## 2019-09-03 DIAGNOSIS — Z992 Dependence on renal dialysis: Secondary | ICD-10-CM | POA: Diagnosis not present

## 2019-09-03 DIAGNOSIS — D509 Iron deficiency anemia, unspecified: Secondary | ICD-10-CM | POA: Diagnosis not present

## 2019-09-04 DIAGNOSIS — N186 End stage renal disease: Secondary | ICD-10-CM | POA: Diagnosis not present

## 2019-09-04 DIAGNOSIS — Z992 Dependence on renal dialysis: Secondary | ICD-10-CM | POA: Diagnosis not present

## 2019-09-04 DIAGNOSIS — D509 Iron deficiency anemia, unspecified: Secondary | ICD-10-CM | POA: Diagnosis not present

## 2019-09-05 DIAGNOSIS — Z992 Dependence on renal dialysis: Secondary | ICD-10-CM | POA: Diagnosis not present

## 2019-09-05 DIAGNOSIS — N186 End stage renal disease: Secondary | ICD-10-CM | POA: Diagnosis not present

## 2019-09-05 DIAGNOSIS — D509 Iron deficiency anemia, unspecified: Secondary | ICD-10-CM | POA: Diagnosis not present

## 2019-09-06 DIAGNOSIS — D509 Iron deficiency anemia, unspecified: Secondary | ICD-10-CM | POA: Diagnosis not present

## 2019-09-06 DIAGNOSIS — N186 End stage renal disease: Secondary | ICD-10-CM | POA: Diagnosis not present

## 2019-09-06 DIAGNOSIS — Z992 Dependence on renal dialysis: Secondary | ICD-10-CM | POA: Diagnosis not present

## 2019-09-07 DIAGNOSIS — N186 End stage renal disease: Secondary | ICD-10-CM | POA: Diagnosis not present

## 2019-09-07 DIAGNOSIS — D509 Iron deficiency anemia, unspecified: Secondary | ICD-10-CM | POA: Diagnosis not present

## 2019-09-07 DIAGNOSIS — Z992 Dependence on renal dialysis: Secondary | ICD-10-CM | POA: Diagnosis not present

## 2019-09-08 DIAGNOSIS — Z992 Dependence on renal dialysis: Secondary | ICD-10-CM | POA: Diagnosis not present

## 2019-09-08 DIAGNOSIS — N186 End stage renal disease: Secondary | ICD-10-CM | POA: Diagnosis not present

## 2019-09-08 DIAGNOSIS — D509 Iron deficiency anemia, unspecified: Secondary | ICD-10-CM | POA: Diagnosis not present

## 2019-09-09 DIAGNOSIS — D509 Iron deficiency anemia, unspecified: Secondary | ICD-10-CM | POA: Diagnosis not present

## 2019-09-09 DIAGNOSIS — Z992 Dependence on renal dialysis: Secondary | ICD-10-CM | POA: Diagnosis not present

## 2019-09-09 DIAGNOSIS — N186 End stage renal disease: Secondary | ICD-10-CM | POA: Diagnosis not present

## 2019-09-10 DIAGNOSIS — Z992 Dependence on renal dialysis: Secondary | ICD-10-CM | POA: Diagnosis not present

## 2019-09-10 DIAGNOSIS — D509 Iron deficiency anemia, unspecified: Secondary | ICD-10-CM | POA: Diagnosis not present

## 2019-09-10 DIAGNOSIS — N186 End stage renal disease: Secondary | ICD-10-CM | POA: Diagnosis not present

## 2019-09-11 DIAGNOSIS — Z992 Dependence on renal dialysis: Secondary | ICD-10-CM | POA: Diagnosis not present

## 2019-09-11 DIAGNOSIS — N186 End stage renal disease: Secondary | ICD-10-CM | POA: Diagnosis not present

## 2019-09-11 DIAGNOSIS — D509 Iron deficiency anemia, unspecified: Secondary | ICD-10-CM | POA: Diagnosis not present

## 2019-09-12 DIAGNOSIS — D509 Iron deficiency anemia, unspecified: Secondary | ICD-10-CM | POA: Diagnosis not present

## 2019-09-12 DIAGNOSIS — Z992 Dependence on renal dialysis: Secondary | ICD-10-CM | POA: Diagnosis not present

## 2019-09-12 DIAGNOSIS — N186 End stage renal disease: Secondary | ICD-10-CM | POA: Diagnosis not present

## 2019-09-13 DIAGNOSIS — N186 End stage renal disease: Secondary | ICD-10-CM | POA: Diagnosis not present

## 2019-09-13 DIAGNOSIS — D509 Iron deficiency anemia, unspecified: Secondary | ICD-10-CM | POA: Diagnosis not present

## 2019-09-13 DIAGNOSIS — Z992 Dependence on renal dialysis: Secondary | ICD-10-CM | POA: Diagnosis not present

## 2019-09-14 DIAGNOSIS — N186 End stage renal disease: Secondary | ICD-10-CM | POA: Diagnosis not present

## 2019-09-14 DIAGNOSIS — Z992 Dependence on renal dialysis: Secondary | ICD-10-CM | POA: Diagnosis not present

## 2019-09-14 DIAGNOSIS — D509 Iron deficiency anemia, unspecified: Secondary | ICD-10-CM | POA: Diagnosis not present

## 2019-09-15 DIAGNOSIS — D509 Iron deficiency anemia, unspecified: Secondary | ICD-10-CM | POA: Diagnosis not present

## 2019-09-15 DIAGNOSIS — N186 End stage renal disease: Secondary | ICD-10-CM | POA: Diagnosis not present

## 2019-09-15 DIAGNOSIS — Z992 Dependence on renal dialysis: Secondary | ICD-10-CM | POA: Diagnosis not present

## 2019-09-16 DIAGNOSIS — D509 Iron deficiency anemia, unspecified: Secondary | ICD-10-CM | POA: Diagnosis not present

## 2019-09-16 DIAGNOSIS — N186 End stage renal disease: Secondary | ICD-10-CM | POA: Diagnosis not present

## 2019-09-16 DIAGNOSIS — Z992 Dependence on renal dialysis: Secondary | ICD-10-CM | POA: Diagnosis not present

## 2019-09-17 DIAGNOSIS — Z992 Dependence on renal dialysis: Secondary | ICD-10-CM | POA: Diagnosis not present

## 2019-09-17 DIAGNOSIS — D509 Iron deficiency anemia, unspecified: Secondary | ICD-10-CM | POA: Diagnosis not present

## 2019-09-17 DIAGNOSIS — N186 End stage renal disease: Secondary | ICD-10-CM | POA: Diagnosis not present

## 2019-09-18 DIAGNOSIS — D509 Iron deficiency anemia, unspecified: Secondary | ICD-10-CM | POA: Diagnosis not present

## 2019-09-18 DIAGNOSIS — N186 End stage renal disease: Secondary | ICD-10-CM | POA: Diagnosis not present

## 2019-09-18 DIAGNOSIS — Z992 Dependence on renal dialysis: Secondary | ICD-10-CM | POA: Diagnosis not present

## 2019-09-19 DIAGNOSIS — N186 End stage renal disease: Secondary | ICD-10-CM | POA: Diagnosis not present

## 2019-09-19 DIAGNOSIS — Z992 Dependence on renal dialysis: Secondary | ICD-10-CM | POA: Diagnosis not present

## 2019-09-19 DIAGNOSIS — D509 Iron deficiency anemia, unspecified: Secondary | ICD-10-CM | POA: Diagnosis not present

## 2019-09-20 DIAGNOSIS — N186 End stage renal disease: Secondary | ICD-10-CM | POA: Diagnosis not present

## 2019-09-20 DIAGNOSIS — D509 Iron deficiency anemia, unspecified: Secondary | ICD-10-CM | POA: Diagnosis not present

## 2019-09-20 DIAGNOSIS — Z992 Dependence on renal dialysis: Secondary | ICD-10-CM | POA: Diagnosis not present

## 2019-09-21 DIAGNOSIS — D509 Iron deficiency anemia, unspecified: Secondary | ICD-10-CM | POA: Diagnosis not present

## 2019-09-21 DIAGNOSIS — N186 End stage renal disease: Secondary | ICD-10-CM | POA: Diagnosis not present

## 2019-09-21 DIAGNOSIS — Z992 Dependence on renal dialysis: Secondary | ICD-10-CM | POA: Diagnosis not present

## 2019-09-22 DIAGNOSIS — Z992 Dependence on renal dialysis: Secondary | ICD-10-CM | POA: Diagnosis not present

## 2019-09-22 DIAGNOSIS — D509 Iron deficiency anemia, unspecified: Secondary | ICD-10-CM | POA: Diagnosis not present

## 2019-09-22 DIAGNOSIS — N186 End stage renal disease: Secondary | ICD-10-CM | POA: Diagnosis not present

## 2019-09-23 DIAGNOSIS — Z992 Dependence on renal dialysis: Secondary | ICD-10-CM | POA: Diagnosis not present

## 2019-09-23 DIAGNOSIS — N186 End stage renal disease: Secondary | ICD-10-CM | POA: Diagnosis not present

## 2019-09-23 DIAGNOSIS — D509 Iron deficiency anemia, unspecified: Secondary | ICD-10-CM | POA: Diagnosis not present

## 2019-09-24 DIAGNOSIS — D509 Iron deficiency anemia, unspecified: Secondary | ICD-10-CM | POA: Diagnosis not present

## 2019-09-24 DIAGNOSIS — Z992 Dependence on renal dialysis: Secondary | ICD-10-CM | POA: Diagnosis not present

## 2019-09-24 DIAGNOSIS — N186 End stage renal disease: Secondary | ICD-10-CM | POA: Diagnosis not present

## 2019-09-25 DIAGNOSIS — D509 Iron deficiency anemia, unspecified: Secondary | ICD-10-CM | POA: Diagnosis not present

## 2019-09-25 DIAGNOSIS — N186 End stage renal disease: Secondary | ICD-10-CM | POA: Diagnosis not present

## 2019-09-25 DIAGNOSIS — Z992 Dependence on renal dialysis: Secondary | ICD-10-CM | POA: Diagnosis not present

## 2019-09-26 DIAGNOSIS — Z992 Dependence on renal dialysis: Secondary | ICD-10-CM | POA: Diagnosis not present

## 2019-09-26 DIAGNOSIS — N186 End stage renal disease: Secondary | ICD-10-CM | POA: Diagnosis not present

## 2019-09-26 DIAGNOSIS — D509 Iron deficiency anemia, unspecified: Secondary | ICD-10-CM | POA: Diagnosis not present

## 2019-09-27 DIAGNOSIS — D509 Iron deficiency anemia, unspecified: Secondary | ICD-10-CM | POA: Diagnosis not present

## 2019-09-27 DIAGNOSIS — Z992 Dependence on renal dialysis: Secondary | ICD-10-CM | POA: Diagnosis not present

## 2019-09-27 DIAGNOSIS — N186 End stage renal disease: Secondary | ICD-10-CM | POA: Diagnosis not present

## 2019-09-28 DIAGNOSIS — N186 End stage renal disease: Secondary | ICD-10-CM | POA: Diagnosis not present

## 2019-09-28 DIAGNOSIS — D509 Iron deficiency anemia, unspecified: Secondary | ICD-10-CM | POA: Diagnosis not present

## 2019-09-28 DIAGNOSIS — Z992 Dependence on renal dialysis: Secondary | ICD-10-CM | POA: Diagnosis not present

## 2019-09-29 DIAGNOSIS — N186 End stage renal disease: Secondary | ICD-10-CM | POA: Diagnosis not present

## 2019-09-29 DIAGNOSIS — D509 Iron deficiency anemia, unspecified: Secondary | ICD-10-CM | POA: Diagnosis not present

## 2019-09-29 DIAGNOSIS — Z992 Dependence on renal dialysis: Secondary | ICD-10-CM | POA: Diagnosis not present

## 2019-09-30 DIAGNOSIS — D509 Iron deficiency anemia, unspecified: Secondary | ICD-10-CM | POA: Diagnosis not present

## 2019-09-30 DIAGNOSIS — Z992 Dependence on renal dialysis: Secondary | ICD-10-CM | POA: Diagnosis not present

## 2019-09-30 DIAGNOSIS — N186 End stage renal disease: Secondary | ICD-10-CM | POA: Diagnosis not present

## 2019-10-01 DIAGNOSIS — N186 End stage renal disease: Secondary | ICD-10-CM | POA: Diagnosis not present

## 2019-10-01 DIAGNOSIS — Z992 Dependence on renal dialysis: Secondary | ICD-10-CM | POA: Diagnosis not present

## 2019-10-01 DIAGNOSIS — D509 Iron deficiency anemia, unspecified: Secondary | ICD-10-CM | POA: Diagnosis not present

## 2019-10-02 DIAGNOSIS — D509 Iron deficiency anemia, unspecified: Secondary | ICD-10-CM | POA: Diagnosis not present

## 2019-10-02 DIAGNOSIS — Z23 Encounter for immunization: Secondary | ICD-10-CM | POA: Diagnosis not present

## 2019-10-02 DIAGNOSIS — N186 End stage renal disease: Secondary | ICD-10-CM | POA: Diagnosis not present

## 2019-10-02 DIAGNOSIS — Z992 Dependence on renal dialysis: Secondary | ICD-10-CM | POA: Diagnosis not present

## 2019-10-03 DIAGNOSIS — Z992 Dependence on renal dialysis: Secondary | ICD-10-CM | POA: Diagnosis not present

## 2019-10-03 DIAGNOSIS — D509 Iron deficiency anemia, unspecified: Secondary | ICD-10-CM | POA: Diagnosis not present

## 2019-10-03 DIAGNOSIS — N186 End stage renal disease: Secondary | ICD-10-CM | POA: Diagnosis not present

## 2019-10-04 DIAGNOSIS — N186 End stage renal disease: Secondary | ICD-10-CM | POA: Diagnosis not present

## 2019-10-04 DIAGNOSIS — D509 Iron deficiency anemia, unspecified: Secondary | ICD-10-CM | POA: Diagnosis not present

## 2019-10-04 DIAGNOSIS — Z992 Dependence on renal dialysis: Secondary | ICD-10-CM | POA: Diagnosis not present

## 2019-10-05 DIAGNOSIS — Z992 Dependence on renal dialysis: Secondary | ICD-10-CM | POA: Diagnosis not present

## 2019-10-05 DIAGNOSIS — N186 End stage renal disease: Secondary | ICD-10-CM | POA: Diagnosis not present

## 2019-10-05 DIAGNOSIS — D509 Iron deficiency anemia, unspecified: Secondary | ICD-10-CM | POA: Diagnosis not present

## 2019-10-06 DIAGNOSIS — Z992 Dependence on renal dialysis: Secondary | ICD-10-CM | POA: Diagnosis not present

## 2019-10-06 DIAGNOSIS — D509 Iron deficiency anemia, unspecified: Secondary | ICD-10-CM | POA: Diagnosis not present

## 2019-10-06 DIAGNOSIS — N186 End stage renal disease: Secondary | ICD-10-CM | POA: Diagnosis not present

## 2019-10-07 DIAGNOSIS — N186 End stage renal disease: Secondary | ICD-10-CM | POA: Diagnosis not present

## 2019-10-07 DIAGNOSIS — Z992 Dependence on renal dialysis: Secondary | ICD-10-CM | POA: Diagnosis not present

## 2019-10-07 DIAGNOSIS — D509 Iron deficiency anemia, unspecified: Secondary | ICD-10-CM | POA: Diagnosis not present

## 2019-10-08 DIAGNOSIS — Z992 Dependence on renal dialysis: Secondary | ICD-10-CM | POA: Diagnosis not present

## 2019-10-08 DIAGNOSIS — D509 Iron deficiency anemia, unspecified: Secondary | ICD-10-CM | POA: Diagnosis not present

## 2019-10-08 DIAGNOSIS — N186 End stage renal disease: Secondary | ICD-10-CM | POA: Diagnosis not present

## 2019-10-09 DIAGNOSIS — N186 End stage renal disease: Secondary | ICD-10-CM | POA: Diagnosis not present

## 2019-10-09 DIAGNOSIS — D509 Iron deficiency anemia, unspecified: Secondary | ICD-10-CM | POA: Diagnosis not present

## 2019-10-09 DIAGNOSIS — Z992 Dependence on renal dialysis: Secondary | ICD-10-CM | POA: Diagnosis not present

## 2019-10-10 DIAGNOSIS — D509 Iron deficiency anemia, unspecified: Secondary | ICD-10-CM | POA: Diagnosis not present

## 2019-10-10 DIAGNOSIS — Z992 Dependence on renal dialysis: Secondary | ICD-10-CM | POA: Diagnosis not present

## 2019-10-10 DIAGNOSIS — N186 End stage renal disease: Secondary | ICD-10-CM | POA: Diagnosis not present

## 2019-10-11 DIAGNOSIS — D509 Iron deficiency anemia, unspecified: Secondary | ICD-10-CM | POA: Diagnosis not present

## 2019-10-11 DIAGNOSIS — N186 End stage renal disease: Secondary | ICD-10-CM | POA: Diagnosis not present

## 2019-10-11 DIAGNOSIS — Z992 Dependence on renal dialysis: Secondary | ICD-10-CM | POA: Diagnosis not present

## 2019-10-12 DIAGNOSIS — N186 End stage renal disease: Secondary | ICD-10-CM | POA: Diagnosis not present

## 2019-10-12 DIAGNOSIS — Z992 Dependence on renal dialysis: Secondary | ICD-10-CM | POA: Diagnosis not present

## 2019-10-12 DIAGNOSIS — D509 Iron deficiency anemia, unspecified: Secondary | ICD-10-CM | POA: Diagnosis not present

## 2019-10-13 DIAGNOSIS — D509 Iron deficiency anemia, unspecified: Secondary | ICD-10-CM | POA: Diagnosis not present

## 2019-10-13 DIAGNOSIS — N186 End stage renal disease: Secondary | ICD-10-CM | POA: Diagnosis not present

## 2019-10-13 DIAGNOSIS — Z992 Dependence on renal dialysis: Secondary | ICD-10-CM | POA: Diagnosis not present

## 2019-10-14 DIAGNOSIS — N186 End stage renal disease: Secondary | ICD-10-CM | POA: Diagnosis not present

## 2019-10-14 DIAGNOSIS — Z992 Dependence on renal dialysis: Secondary | ICD-10-CM | POA: Diagnosis not present

## 2019-10-14 DIAGNOSIS — D509 Iron deficiency anemia, unspecified: Secondary | ICD-10-CM | POA: Diagnosis not present

## 2019-10-15 DIAGNOSIS — N186 End stage renal disease: Secondary | ICD-10-CM | POA: Diagnosis not present

## 2019-10-15 DIAGNOSIS — D509 Iron deficiency anemia, unspecified: Secondary | ICD-10-CM | POA: Diagnosis not present

## 2019-10-15 DIAGNOSIS — Z992 Dependence on renal dialysis: Secondary | ICD-10-CM | POA: Diagnosis not present

## 2019-10-16 DIAGNOSIS — D509 Iron deficiency anemia, unspecified: Secondary | ICD-10-CM | POA: Diagnosis not present

## 2019-10-16 DIAGNOSIS — N186 End stage renal disease: Secondary | ICD-10-CM | POA: Diagnosis not present

## 2019-10-16 DIAGNOSIS — Z992 Dependence on renal dialysis: Secondary | ICD-10-CM | POA: Diagnosis not present

## 2019-10-17 DIAGNOSIS — N186 End stage renal disease: Secondary | ICD-10-CM | POA: Diagnosis not present

## 2019-10-17 DIAGNOSIS — D509 Iron deficiency anemia, unspecified: Secondary | ICD-10-CM | POA: Diagnosis not present

## 2019-10-17 DIAGNOSIS — Z992 Dependence on renal dialysis: Secondary | ICD-10-CM | POA: Diagnosis not present

## 2019-10-18 DIAGNOSIS — N185 Chronic kidney disease, stage 5: Secondary | ICD-10-CM | POA: Diagnosis not present

## 2019-10-18 DIAGNOSIS — D509 Iron deficiency anemia, unspecified: Secondary | ICD-10-CM | POA: Diagnosis not present

## 2019-10-18 DIAGNOSIS — N401 Enlarged prostate with lower urinary tract symptoms: Secondary | ICD-10-CM | POA: Diagnosis not present

## 2019-10-18 DIAGNOSIS — I10 Essential (primary) hypertension: Secondary | ICD-10-CM | POA: Diagnosis not present

## 2019-10-18 DIAGNOSIS — K219 Gastro-esophageal reflux disease without esophagitis: Secondary | ICD-10-CM | POA: Diagnosis not present

## 2019-10-18 DIAGNOSIS — Z992 Dependence on renal dialysis: Secondary | ICD-10-CM | POA: Diagnosis not present

## 2019-10-18 DIAGNOSIS — N186 End stage renal disease: Secondary | ICD-10-CM | POA: Diagnosis not present

## 2019-10-18 DIAGNOSIS — D508 Other iron deficiency anemias: Secondary | ICD-10-CM | POA: Diagnosis not present

## 2019-10-19 DIAGNOSIS — D509 Iron deficiency anemia, unspecified: Secondary | ICD-10-CM | POA: Diagnosis not present

## 2019-10-19 DIAGNOSIS — N186 End stage renal disease: Secondary | ICD-10-CM | POA: Diagnosis not present

## 2019-10-19 DIAGNOSIS — Z992 Dependence on renal dialysis: Secondary | ICD-10-CM | POA: Diagnosis not present

## 2019-10-20 DIAGNOSIS — N186 End stage renal disease: Secondary | ICD-10-CM | POA: Diagnosis not present

## 2019-10-20 DIAGNOSIS — Z992 Dependence on renal dialysis: Secondary | ICD-10-CM | POA: Diagnosis not present

## 2019-10-20 DIAGNOSIS — D509 Iron deficiency anemia, unspecified: Secondary | ICD-10-CM | POA: Diagnosis not present

## 2019-10-21 DIAGNOSIS — N186 End stage renal disease: Secondary | ICD-10-CM | POA: Diagnosis not present

## 2019-10-21 DIAGNOSIS — N2581 Secondary hyperparathyroidism of renal origin: Secondary | ICD-10-CM | POA: Diagnosis not present

## 2019-10-21 DIAGNOSIS — D509 Iron deficiency anemia, unspecified: Secondary | ICD-10-CM | POA: Diagnosis not present

## 2019-10-21 DIAGNOSIS — Z992 Dependence on renal dialysis: Secondary | ICD-10-CM | POA: Diagnosis not present

## 2019-10-22 DIAGNOSIS — N2581 Secondary hyperparathyroidism of renal origin: Secondary | ICD-10-CM | POA: Diagnosis not present

## 2019-10-22 DIAGNOSIS — Z992 Dependence on renal dialysis: Secondary | ICD-10-CM | POA: Diagnosis not present

## 2019-10-22 DIAGNOSIS — D509 Iron deficiency anemia, unspecified: Secondary | ICD-10-CM | POA: Diagnosis not present

## 2019-10-22 DIAGNOSIS — N186 End stage renal disease: Secondary | ICD-10-CM | POA: Diagnosis not present

## 2019-10-23 DIAGNOSIS — N186 End stage renal disease: Secondary | ICD-10-CM | POA: Diagnosis not present

## 2019-10-23 DIAGNOSIS — N2581 Secondary hyperparathyroidism of renal origin: Secondary | ICD-10-CM | POA: Diagnosis not present

## 2019-10-23 DIAGNOSIS — Z992 Dependence on renal dialysis: Secondary | ICD-10-CM | POA: Diagnosis not present

## 2019-10-23 DIAGNOSIS — D509 Iron deficiency anemia, unspecified: Secondary | ICD-10-CM | POA: Diagnosis not present

## 2019-10-24 DIAGNOSIS — N2581 Secondary hyperparathyroidism of renal origin: Secondary | ICD-10-CM | POA: Diagnosis not present

## 2019-10-24 DIAGNOSIS — N186 End stage renal disease: Secondary | ICD-10-CM | POA: Diagnosis not present

## 2019-10-24 DIAGNOSIS — D509 Iron deficiency anemia, unspecified: Secondary | ICD-10-CM | POA: Diagnosis not present

## 2019-10-24 DIAGNOSIS — Z992 Dependence on renal dialysis: Secondary | ICD-10-CM | POA: Diagnosis not present

## 2019-10-25 DIAGNOSIS — D509 Iron deficiency anemia, unspecified: Secondary | ICD-10-CM | POA: Diagnosis not present

## 2019-10-25 DIAGNOSIS — Z992 Dependence on renal dialysis: Secondary | ICD-10-CM | POA: Diagnosis not present

## 2019-10-25 DIAGNOSIS — N2581 Secondary hyperparathyroidism of renal origin: Secondary | ICD-10-CM | POA: Diagnosis not present

## 2019-10-25 DIAGNOSIS — N186 End stage renal disease: Secondary | ICD-10-CM | POA: Diagnosis not present

## 2019-10-26 DIAGNOSIS — D509 Iron deficiency anemia, unspecified: Secondary | ICD-10-CM | POA: Diagnosis not present

## 2019-10-26 DIAGNOSIS — N2581 Secondary hyperparathyroidism of renal origin: Secondary | ICD-10-CM | POA: Diagnosis not present

## 2019-10-26 DIAGNOSIS — Z992 Dependence on renal dialysis: Secondary | ICD-10-CM | POA: Diagnosis not present

## 2019-10-26 DIAGNOSIS — N186 End stage renal disease: Secondary | ICD-10-CM | POA: Diagnosis not present

## 2019-10-27 DIAGNOSIS — N186 End stage renal disease: Secondary | ICD-10-CM | POA: Diagnosis not present

## 2019-10-27 DIAGNOSIS — D509 Iron deficiency anemia, unspecified: Secondary | ICD-10-CM | POA: Diagnosis not present

## 2019-10-27 DIAGNOSIS — Z992 Dependence on renal dialysis: Secondary | ICD-10-CM | POA: Diagnosis not present

## 2019-10-27 DIAGNOSIS — N2581 Secondary hyperparathyroidism of renal origin: Secondary | ICD-10-CM | POA: Diagnosis not present

## 2019-10-28 DIAGNOSIS — N2581 Secondary hyperparathyroidism of renal origin: Secondary | ICD-10-CM | POA: Diagnosis not present

## 2019-10-28 DIAGNOSIS — Z992 Dependence on renal dialysis: Secondary | ICD-10-CM | POA: Diagnosis not present

## 2019-10-28 DIAGNOSIS — N186 End stage renal disease: Secondary | ICD-10-CM | POA: Diagnosis not present

## 2019-10-28 DIAGNOSIS — D509 Iron deficiency anemia, unspecified: Secondary | ICD-10-CM | POA: Diagnosis not present

## 2019-10-29 DIAGNOSIS — N2581 Secondary hyperparathyroidism of renal origin: Secondary | ICD-10-CM | POA: Diagnosis not present

## 2019-10-29 DIAGNOSIS — N186 End stage renal disease: Secondary | ICD-10-CM | POA: Diagnosis not present

## 2019-10-29 DIAGNOSIS — D509 Iron deficiency anemia, unspecified: Secondary | ICD-10-CM | POA: Diagnosis not present

## 2019-10-29 DIAGNOSIS — Z992 Dependence on renal dialysis: Secondary | ICD-10-CM | POA: Diagnosis not present

## 2019-10-30 DIAGNOSIS — D509 Iron deficiency anemia, unspecified: Secondary | ICD-10-CM | POA: Diagnosis not present

## 2019-10-30 DIAGNOSIS — N2581 Secondary hyperparathyroidism of renal origin: Secondary | ICD-10-CM | POA: Diagnosis not present

## 2019-10-30 DIAGNOSIS — N186 End stage renal disease: Secondary | ICD-10-CM | POA: Diagnosis not present

## 2019-10-30 DIAGNOSIS — Z992 Dependence on renal dialysis: Secondary | ICD-10-CM | POA: Diagnosis not present

## 2019-10-31 DIAGNOSIS — N186 End stage renal disease: Secondary | ICD-10-CM | POA: Diagnosis not present

## 2019-10-31 DIAGNOSIS — D509 Iron deficiency anemia, unspecified: Secondary | ICD-10-CM | POA: Diagnosis not present

## 2019-10-31 DIAGNOSIS — N2581 Secondary hyperparathyroidism of renal origin: Secondary | ICD-10-CM | POA: Diagnosis not present

## 2019-10-31 DIAGNOSIS — Z992 Dependence on renal dialysis: Secondary | ICD-10-CM | POA: Diagnosis not present

## 2019-11-01 DIAGNOSIS — D509 Iron deficiency anemia, unspecified: Secondary | ICD-10-CM | POA: Diagnosis not present

## 2019-11-01 DIAGNOSIS — N186 End stage renal disease: Secondary | ICD-10-CM | POA: Diagnosis not present

## 2019-11-01 DIAGNOSIS — N2581 Secondary hyperparathyroidism of renal origin: Secondary | ICD-10-CM | POA: Diagnosis not present

## 2019-11-01 DIAGNOSIS — Z992 Dependence on renal dialysis: Secondary | ICD-10-CM | POA: Diagnosis not present

## 2019-11-02 DIAGNOSIS — Z992 Dependence on renal dialysis: Secondary | ICD-10-CM | POA: Diagnosis not present

## 2019-11-02 DIAGNOSIS — N186 End stage renal disease: Secondary | ICD-10-CM | POA: Diagnosis not present

## 2019-11-02 DIAGNOSIS — D509 Iron deficiency anemia, unspecified: Secondary | ICD-10-CM | POA: Diagnosis not present

## 2019-11-02 DIAGNOSIS — N2581 Secondary hyperparathyroidism of renal origin: Secondary | ICD-10-CM | POA: Diagnosis not present

## 2019-11-03 DIAGNOSIS — Z992 Dependence on renal dialysis: Secondary | ICD-10-CM | POA: Diagnosis not present

## 2019-11-03 DIAGNOSIS — N186 End stage renal disease: Secondary | ICD-10-CM | POA: Diagnosis not present

## 2019-11-03 DIAGNOSIS — D509 Iron deficiency anemia, unspecified: Secondary | ICD-10-CM | POA: Diagnosis not present

## 2019-11-03 DIAGNOSIS — N2581 Secondary hyperparathyroidism of renal origin: Secondary | ICD-10-CM | POA: Diagnosis not present

## 2019-11-04 DIAGNOSIS — N2581 Secondary hyperparathyroidism of renal origin: Secondary | ICD-10-CM | POA: Diagnosis not present

## 2019-11-04 DIAGNOSIS — D509 Iron deficiency anemia, unspecified: Secondary | ICD-10-CM | POA: Diagnosis not present

## 2019-11-04 DIAGNOSIS — Z992 Dependence on renal dialysis: Secondary | ICD-10-CM | POA: Diagnosis not present

## 2019-11-04 DIAGNOSIS — N186 End stage renal disease: Secondary | ICD-10-CM | POA: Diagnosis not present

## 2019-11-05 DIAGNOSIS — N2581 Secondary hyperparathyroidism of renal origin: Secondary | ICD-10-CM | POA: Diagnosis not present

## 2019-11-05 DIAGNOSIS — N186 End stage renal disease: Secondary | ICD-10-CM | POA: Diagnosis not present

## 2019-11-05 DIAGNOSIS — D509 Iron deficiency anemia, unspecified: Secondary | ICD-10-CM | POA: Diagnosis not present

## 2019-11-05 DIAGNOSIS — Z992 Dependence on renal dialysis: Secondary | ICD-10-CM | POA: Diagnosis not present

## 2019-11-06 DIAGNOSIS — N2581 Secondary hyperparathyroidism of renal origin: Secondary | ICD-10-CM | POA: Diagnosis not present

## 2019-11-06 DIAGNOSIS — D509 Iron deficiency anemia, unspecified: Secondary | ICD-10-CM | POA: Diagnosis not present

## 2019-11-06 DIAGNOSIS — N186 End stage renal disease: Secondary | ICD-10-CM | POA: Diagnosis not present

## 2019-11-06 DIAGNOSIS — Z992 Dependence on renal dialysis: Secondary | ICD-10-CM | POA: Diagnosis not present

## 2019-11-07 DIAGNOSIS — Z992 Dependence on renal dialysis: Secondary | ICD-10-CM | POA: Diagnosis not present

## 2019-11-07 DIAGNOSIS — D509 Iron deficiency anemia, unspecified: Secondary | ICD-10-CM | POA: Diagnosis not present

## 2019-11-07 DIAGNOSIS — N186 End stage renal disease: Secondary | ICD-10-CM | POA: Diagnosis not present

## 2019-11-07 DIAGNOSIS — N2581 Secondary hyperparathyroidism of renal origin: Secondary | ICD-10-CM | POA: Diagnosis not present

## 2019-11-08 DIAGNOSIS — N2581 Secondary hyperparathyroidism of renal origin: Secondary | ICD-10-CM | POA: Diagnosis not present

## 2019-11-08 DIAGNOSIS — D509 Iron deficiency anemia, unspecified: Secondary | ICD-10-CM | POA: Diagnosis not present

## 2019-11-08 DIAGNOSIS — Z992 Dependence on renal dialysis: Secondary | ICD-10-CM | POA: Diagnosis not present

## 2019-11-08 DIAGNOSIS — N186 End stage renal disease: Secondary | ICD-10-CM | POA: Diagnosis not present

## 2019-11-09 DIAGNOSIS — Z992 Dependence on renal dialysis: Secondary | ICD-10-CM | POA: Diagnosis not present

## 2019-11-09 DIAGNOSIS — D509 Iron deficiency anemia, unspecified: Secondary | ICD-10-CM | POA: Diagnosis not present

## 2019-11-09 DIAGNOSIS — N2581 Secondary hyperparathyroidism of renal origin: Secondary | ICD-10-CM | POA: Diagnosis not present

## 2019-11-09 DIAGNOSIS — N186 End stage renal disease: Secondary | ICD-10-CM | POA: Diagnosis not present

## 2019-11-10 DIAGNOSIS — N2581 Secondary hyperparathyroidism of renal origin: Secondary | ICD-10-CM | POA: Diagnosis not present

## 2019-11-10 DIAGNOSIS — D509 Iron deficiency anemia, unspecified: Secondary | ICD-10-CM | POA: Diagnosis not present

## 2019-11-10 DIAGNOSIS — N186 End stage renal disease: Secondary | ICD-10-CM | POA: Diagnosis not present

## 2019-11-10 DIAGNOSIS — Z992 Dependence on renal dialysis: Secondary | ICD-10-CM | POA: Diagnosis not present

## 2019-11-11 DIAGNOSIS — N186 End stage renal disease: Secondary | ICD-10-CM | POA: Diagnosis not present

## 2019-11-11 DIAGNOSIS — N2581 Secondary hyperparathyroidism of renal origin: Secondary | ICD-10-CM | POA: Diagnosis not present

## 2019-11-11 DIAGNOSIS — Z992 Dependence on renal dialysis: Secondary | ICD-10-CM | POA: Diagnosis not present

## 2019-11-11 DIAGNOSIS — D509 Iron deficiency anemia, unspecified: Secondary | ICD-10-CM | POA: Diagnosis not present

## 2019-11-12 DIAGNOSIS — Z992 Dependence on renal dialysis: Secondary | ICD-10-CM | POA: Diagnosis not present

## 2019-11-12 DIAGNOSIS — N186 End stage renal disease: Secondary | ICD-10-CM | POA: Diagnosis not present

## 2019-11-12 DIAGNOSIS — N2581 Secondary hyperparathyroidism of renal origin: Secondary | ICD-10-CM | POA: Diagnosis not present

## 2019-11-12 DIAGNOSIS — D509 Iron deficiency anemia, unspecified: Secondary | ICD-10-CM | POA: Diagnosis not present

## 2019-11-13 DIAGNOSIS — Z992 Dependence on renal dialysis: Secondary | ICD-10-CM | POA: Diagnosis not present

## 2019-11-13 DIAGNOSIS — D509 Iron deficiency anemia, unspecified: Secondary | ICD-10-CM | POA: Diagnosis not present

## 2019-11-13 DIAGNOSIS — N2581 Secondary hyperparathyroidism of renal origin: Secondary | ICD-10-CM | POA: Diagnosis not present

## 2019-11-13 DIAGNOSIS — N186 End stage renal disease: Secondary | ICD-10-CM | POA: Diagnosis not present

## 2019-11-14 DIAGNOSIS — N2581 Secondary hyperparathyroidism of renal origin: Secondary | ICD-10-CM | POA: Diagnosis not present

## 2019-11-14 DIAGNOSIS — D509 Iron deficiency anemia, unspecified: Secondary | ICD-10-CM | POA: Diagnosis not present

## 2019-11-14 DIAGNOSIS — N186 End stage renal disease: Secondary | ICD-10-CM | POA: Diagnosis not present

## 2019-11-14 DIAGNOSIS — Z992 Dependence on renal dialysis: Secondary | ICD-10-CM | POA: Diagnosis not present

## 2019-11-15 DIAGNOSIS — N186 End stage renal disease: Secondary | ICD-10-CM | POA: Diagnosis not present

## 2019-11-15 DIAGNOSIS — D509 Iron deficiency anemia, unspecified: Secondary | ICD-10-CM | POA: Diagnosis not present

## 2019-11-15 DIAGNOSIS — Z992 Dependence on renal dialysis: Secondary | ICD-10-CM | POA: Diagnosis not present

## 2019-11-15 DIAGNOSIS — N2581 Secondary hyperparathyroidism of renal origin: Secondary | ICD-10-CM | POA: Diagnosis not present

## 2019-11-16 DIAGNOSIS — D509 Iron deficiency anemia, unspecified: Secondary | ICD-10-CM | POA: Diagnosis not present

## 2019-11-16 DIAGNOSIS — N2581 Secondary hyperparathyroidism of renal origin: Secondary | ICD-10-CM | POA: Diagnosis not present

## 2019-11-16 DIAGNOSIS — N186 End stage renal disease: Secondary | ICD-10-CM | POA: Diagnosis not present

## 2019-11-16 DIAGNOSIS — Z992 Dependence on renal dialysis: Secondary | ICD-10-CM | POA: Diagnosis not present

## 2019-11-17 DIAGNOSIS — N2581 Secondary hyperparathyroidism of renal origin: Secondary | ICD-10-CM | POA: Diagnosis not present

## 2019-11-17 DIAGNOSIS — N186 End stage renal disease: Secondary | ICD-10-CM | POA: Diagnosis not present

## 2019-11-17 DIAGNOSIS — Z992 Dependence on renal dialysis: Secondary | ICD-10-CM | POA: Diagnosis not present

## 2019-11-17 DIAGNOSIS — D509 Iron deficiency anemia, unspecified: Secondary | ICD-10-CM | POA: Diagnosis not present

## 2019-11-18 DIAGNOSIS — N2581 Secondary hyperparathyroidism of renal origin: Secondary | ICD-10-CM | POA: Diagnosis not present

## 2019-11-18 DIAGNOSIS — D509 Iron deficiency anemia, unspecified: Secondary | ICD-10-CM | POA: Diagnosis not present

## 2019-11-18 DIAGNOSIS — N186 End stage renal disease: Secondary | ICD-10-CM | POA: Diagnosis not present

## 2019-11-18 DIAGNOSIS — Z992 Dependence on renal dialysis: Secondary | ICD-10-CM | POA: Diagnosis not present

## 2019-11-19 DIAGNOSIS — Z992 Dependence on renal dialysis: Secondary | ICD-10-CM | POA: Diagnosis not present

## 2019-11-19 DIAGNOSIS — N186 End stage renal disease: Secondary | ICD-10-CM | POA: Diagnosis not present

## 2019-11-19 DIAGNOSIS — D509 Iron deficiency anemia, unspecified: Secondary | ICD-10-CM | POA: Diagnosis not present

## 2019-11-19 DIAGNOSIS — N2581 Secondary hyperparathyroidism of renal origin: Secondary | ICD-10-CM | POA: Diagnosis not present

## 2019-11-20 DIAGNOSIS — N186 End stage renal disease: Secondary | ICD-10-CM | POA: Diagnosis not present

## 2019-11-20 DIAGNOSIS — D509 Iron deficiency anemia, unspecified: Secondary | ICD-10-CM | POA: Diagnosis not present

## 2019-11-20 DIAGNOSIS — Z992 Dependence on renal dialysis: Secondary | ICD-10-CM | POA: Diagnosis not present

## 2019-11-21 DIAGNOSIS — Z992 Dependence on renal dialysis: Secondary | ICD-10-CM | POA: Diagnosis not present

## 2019-11-21 DIAGNOSIS — N186 End stage renal disease: Secondary | ICD-10-CM | POA: Diagnosis not present

## 2019-11-21 DIAGNOSIS — D509 Iron deficiency anemia, unspecified: Secondary | ICD-10-CM | POA: Diagnosis not present

## 2019-11-22 DIAGNOSIS — Z992 Dependence on renal dialysis: Secondary | ICD-10-CM | POA: Diagnosis not present

## 2019-11-22 DIAGNOSIS — N186 End stage renal disease: Secondary | ICD-10-CM | POA: Diagnosis not present

## 2019-11-22 DIAGNOSIS — D509 Iron deficiency anemia, unspecified: Secondary | ICD-10-CM | POA: Diagnosis not present

## 2019-11-23 DIAGNOSIS — Z992 Dependence on renal dialysis: Secondary | ICD-10-CM | POA: Diagnosis not present

## 2019-11-23 DIAGNOSIS — D509 Iron deficiency anemia, unspecified: Secondary | ICD-10-CM | POA: Diagnosis not present

## 2019-11-23 DIAGNOSIS — N186 End stage renal disease: Secondary | ICD-10-CM | POA: Diagnosis not present

## 2019-11-24 DIAGNOSIS — N186 End stage renal disease: Secondary | ICD-10-CM | POA: Diagnosis not present

## 2019-11-24 DIAGNOSIS — D509 Iron deficiency anemia, unspecified: Secondary | ICD-10-CM | POA: Diagnosis not present

## 2019-11-24 DIAGNOSIS — Z992 Dependence on renal dialysis: Secondary | ICD-10-CM | POA: Diagnosis not present

## 2019-11-25 DIAGNOSIS — D509 Iron deficiency anemia, unspecified: Secondary | ICD-10-CM | POA: Diagnosis not present

## 2019-11-25 DIAGNOSIS — Z992 Dependence on renal dialysis: Secondary | ICD-10-CM | POA: Diagnosis not present

## 2019-11-25 DIAGNOSIS — N186 End stage renal disease: Secondary | ICD-10-CM | POA: Diagnosis not present

## 2019-11-26 DIAGNOSIS — Z992 Dependence on renal dialysis: Secondary | ICD-10-CM | POA: Diagnosis not present

## 2019-11-26 DIAGNOSIS — N186 End stage renal disease: Secondary | ICD-10-CM | POA: Diagnosis not present

## 2019-11-26 DIAGNOSIS — D509 Iron deficiency anemia, unspecified: Secondary | ICD-10-CM | POA: Diagnosis not present

## 2019-11-27 DIAGNOSIS — Z992 Dependence on renal dialysis: Secondary | ICD-10-CM | POA: Diagnosis not present

## 2019-11-27 DIAGNOSIS — N186 End stage renal disease: Secondary | ICD-10-CM | POA: Diagnosis not present

## 2019-11-27 DIAGNOSIS — D509 Iron deficiency anemia, unspecified: Secondary | ICD-10-CM | POA: Diagnosis not present

## 2019-11-28 DIAGNOSIS — D509 Iron deficiency anemia, unspecified: Secondary | ICD-10-CM | POA: Diagnosis not present

## 2019-11-28 DIAGNOSIS — N186 End stage renal disease: Secondary | ICD-10-CM | POA: Diagnosis not present

## 2019-11-28 DIAGNOSIS — Z992 Dependence on renal dialysis: Secondary | ICD-10-CM | POA: Diagnosis not present

## 2019-11-29 DIAGNOSIS — N186 End stage renal disease: Secondary | ICD-10-CM | POA: Diagnosis not present

## 2019-11-29 DIAGNOSIS — Z992 Dependence on renal dialysis: Secondary | ICD-10-CM | POA: Diagnosis not present

## 2019-11-29 DIAGNOSIS — D509 Iron deficiency anemia, unspecified: Secondary | ICD-10-CM | POA: Diagnosis not present

## 2019-11-30 DIAGNOSIS — N186 End stage renal disease: Secondary | ICD-10-CM | POA: Diagnosis not present

## 2019-11-30 DIAGNOSIS — D509 Iron deficiency anemia, unspecified: Secondary | ICD-10-CM | POA: Diagnosis not present

## 2019-11-30 DIAGNOSIS — Z992 Dependence on renal dialysis: Secondary | ICD-10-CM | POA: Diagnosis not present

## 2019-12-01 DIAGNOSIS — N186 End stage renal disease: Secondary | ICD-10-CM | POA: Diagnosis not present

## 2019-12-01 DIAGNOSIS — D509 Iron deficiency anemia, unspecified: Secondary | ICD-10-CM | POA: Diagnosis not present

## 2019-12-01 DIAGNOSIS — Z992 Dependence on renal dialysis: Secondary | ICD-10-CM | POA: Diagnosis not present

## 2019-12-02 DIAGNOSIS — N186 End stage renal disease: Secondary | ICD-10-CM | POA: Diagnosis not present

## 2019-12-02 DIAGNOSIS — D509 Iron deficiency anemia, unspecified: Secondary | ICD-10-CM | POA: Diagnosis not present

## 2019-12-02 DIAGNOSIS — Z992 Dependence on renal dialysis: Secondary | ICD-10-CM | POA: Diagnosis not present

## 2019-12-03 DIAGNOSIS — Z992 Dependence on renal dialysis: Secondary | ICD-10-CM | POA: Diagnosis not present

## 2019-12-03 DIAGNOSIS — N186 End stage renal disease: Secondary | ICD-10-CM | POA: Diagnosis not present

## 2019-12-03 DIAGNOSIS — D509 Iron deficiency anemia, unspecified: Secondary | ICD-10-CM | POA: Diagnosis not present

## 2019-12-04 DIAGNOSIS — D509 Iron deficiency anemia, unspecified: Secondary | ICD-10-CM | POA: Diagnosis not present

## 2019-12-04 DIAGNOSIS — N186 End stage renal disease: Secondary | ICD-10-CM | POA: Diagnosis not present

## 2019-12-04 DIAGNOSIS — Z992 Dependence on renal dialysis: Secondary | ICD-10-CM | POA: Diagnosis not present

## 2019-12-05 DIAGNOSIS — D509 Iron deficiency anemia, unspecified: Secondary | ICD-10-CM | POA: Diagnosis not present

## 2019-12-05 DIAGNOSIS — Z992 Dependence on renal dialysis: Secondary | ICD-10-CM | POA: Diagnosis not present

## 2019-12-05 DIAGNOSIS — N186 End stage renal disease: Secondary | ICD-10-CM | POA: Diagnosis not present

## 2019-12-06 DIAGNOSIS — Z992 Dependence on renal dialysis: Secondary | ICD-10-CM | POA: Diagnosis not present

## 2019-12-06 DIAGNOSIS — D509 Iron deficiency anemia, unspecified: Secondary | ICD-10-CM | POA: Diagnosis not present

## 2019-12-06 DIAGNOSIS — N186 End stage renal disease: Secondary | ICD-10-CM | POA: Diagnosis not present

## 2019-12-07 DIAGNOSIS — D509 Iron deficiency anemia, unspecified: Secondary | ICD-10-CM | POA: Diagnosis not present

## 2019-12-07 DIAGNOSIS — Z992 Dependence on renal dialysis: Secondary | ICD-10-CM | POA: Diagnosis not present

## 2019-12-07 DIAGNOSIS — N186 End stage renal disease: Secondary | ICD-10-CM | POA: Diagnosis not present

## 2019-12-08 DIAGNOSIS — Z992 Dependence on renal dialysis: Secondary | ICD-10-CM | POA: Diagnosis not present

## 2019-12-08 DIAGNOSIS — D509 Iron deficiency anemia, unspecified: Secondary | ICD-10-CM | POA: Diagnosis not present

## 2019-12-08 DIAGNOSIS — N186 End stage renal disease: Secondary | ICD-10-CM | POA: Diagnosis not present

## 2019-12-09 DIAGNOSIS — D509 Iron deficiency anemia, unspecified: Secondary | ICD-10-CM | POA: Diagnosis not present

## 2019-12-09 DIAGNOSIS — N186 End stage renal disease: Secondary | ICD-10-CM | POA: Diagnosis not present

## 2019-12-09 DIAGNOSIS — Z992 Dependence on renal dialysis: Secondary | ICD-10-CM | POA: Diagnosis not present

## 2019-12-10 DIAGNOSIS — N186 End stage renal disease: Secondary | ICD-10-CM | POA: Diagnosis not present

## 2019-12-10 DIAGNOSIS — D509 Iron deficiency anemia, unspecified: Secondary | ICD-10-CM | POA: Diagnosis not present

## 2019-12-10 DIAGNOSIS — Z992 Dependence on renal dialysis: Secondary | ICD-10-CM | POA: Diagnosis not present

## 2019-12-11 DIAGNOSIS — Z992 Dependence on renal dialysis: Secondary | ICD-10-CM | POA: Diagnosis not present

## 2019-12-11 DIAGNOSIS — N186 End stage renal disease: Secondary | ICD-10-CM | POA: Diagnosis not present

## 2019-12-11 DIAGNOSIS — D509 Iron deficiency anemia, unspecified: Secondary | ICD-10-CM | POA: Diagnosis not present

## 2019-12-12 DIAGNOSIS — N186 End stage renal disease: Secondary | ICD-10-CM | POA: Diagnosis not present

## 2019-12-12 DIAGNOSIS — Z992 Dependence on renal dialysis: Secondary | ICD-10-CM | POA: Diagnosis not present

## 2019-12-12 DIAGNOSIS — D509 Iron deficiency anemia, unspecified: Secondary | ICD-10-CM | POA: Diagnosis not present

## 2019-12-13 DIAGNOSIS — D509 Iron deficiency anemia, unspecified: Secondary | ICD-10-CM | POA: Diagnosis not present

## 2019-12-13 DIAGNOSIS — Z992 Dependence on renal dialysis: Secondary | ICD-10-CM | POA: Diagnosis not present

## 2019-12-13 DIAGNOSIS — N186 End stage renal disease: Secondary | ICD-10-CM | POA: Diagnosis not present

## 2019-12-14 DIAGNOSIS — Z992 Dependence on renal dialysis: Secondary | ICD-10-CM | POA: Diagnosis not present

## 2019-12-14 DIAGNOSIS — N186 End stage renal disease: Secondary | ICD-10-CM | POA: Diagnosis not present

## 2019-12-14 DIAGNOSIS — D509 Iron deficiency anemia, unspecified: Secondary | ICD-10-CM | POA: Diagnosis not present

## 2019-12-15 DIAGNOSIS — N186 End stage renal disease: Secondary | ICD-10-CM | POA: Diagnosis not present

## 2019-12-15 DIAGNOSIS — D509 Iron deficiency anemia, unspecified: Secondary | ICD-10-CM | POA: Diagnosis not present

## 2019-12-15 DIAGNOSIS — Z992 Dependence on renal dialysis: Secondary | ICD-10-CM | POA: Diagnosis not present

## 2019-12-16 DIAGNOSIS — D509 Iron deficiency anemia, unspecified: Secondary | ICD-10-CM | POA: Diagnosis not present

## 2019-12-16 DIAGNOSIS — N186 End stage renal disease: Secondary | ICD-10-CM | POA: Diagnosis not present

## 2019-12-16 DIAGNOSIS — Z992 Dependence on renal dialysis: Secondary | ICD-10-CM | POA: Diagnosis not present

## 2019-12-17 DIAGNOSIS — D509 Iron deficiency anemia, unspecified: Secondary | ICD-10-CM | POA: Diagnosis not present

## 2019-12-17 DIAGNOSIS — N186 End stage renal disease: Secondary | ICD-10-CM | POA: Diagnosis not present

## 2019-12-17 DIAGNOSIS — Z992 Dependence on renal dialysis: Secondary | ICD-10-CM | POA: Diagnosis not present

## 2019-12-18 DIAGNOSIS — D509 Iron deficiency anemia, unspecified: Secondary | ICD-10-CM | POA: Diagnosis not present

## 2019-12-18 DIAGNOSIS — N186 End stage renal disease: Secondary | ICD-10-CM | POA: Diagnosis not present

## 2019-12-18 DIAGNOSIS — Z992 Dependence on renal dialysis: Secondary | ICD-10-CM | POA: Diagnosis not present

## 2019-12-19 DIAGNOSIS — N186 End stage renal disease: Secondary | ICD-10-CM | POA: Diagnosis not present

## 2019-12-19 DIAGNOSIS — D509 Iron deficiency anemia, unspecified: Secondary | ICD-10-CM | POA: Diagnosis not present

## 2019-12-19 DIAGNOSIS — Z992 Dependence on renal dialysis: Secondary | ICD-10-CM | POA: Diagnosis not present

## 2019-12-20 DIAGNOSIS — N186 End stage renal disease: Secondary | ICD-10-CM | POA: Diagnosis not present

## 2019-12-20 DIAGNOSIS — Z992 Dependence on renal dialysis: Secondary | ICD-10-CM | POA: Diagnosis not present

## 2019-12-20 DIAGNOSIS — D509 Iron deficiency anemia, unspecified: Secondary | ICD-10-CM | POA: Diagnosis not present

## 2019-12-27 DIAGNOSIS — Z992 Dependence on renal dialysis: Secondary | ICD-10-CM | POA: Diagnosis not present

## 2020-01-20 DIAGNOSIS — D509 Iron deficiency anemia, unspecified: Secondary | ICD-10-CM | POA: Diagnosis not present

## 2020-01-20 DIAGNOSIS — D631 Anemia in chronic kidney disease: Secondary | ICD-10-CM | POA: Diagnosis not present

## 2020-01-20 DIAGNOSIS — N186 End stage renal disease: Secondary | ICD-10-CM | POA: Diagnosis not present

## 2020-01-20 DIAGNOSIS — N2581 Secondary hyperparathyroidism of renal origin: Secondary | ICD-10-CM | POA: Diagnosis not present

## 2020-01-20 DIAGNOSIS — Z992 Dependence on renal dialysis: Secondary | ICD-10-CM | POA: Diagnosis not present

## 2020-01-21 DIAGNOSIS — N186 End stage renal disease: Secondary | ICD-10-CM | POA: Diagnosis not present

## 2020-01-21 DIAGNOSIS — N2581 Secondary hyperparathyroidism of renal origin: Secondary | ICD-10-CM | POA: Diagnosis not present

## 2020-01-21 DIAGNOSIS — D631 Anemia in chronic kidney disease: Secondary | ICD-10-CM | POA: Diagnosis not present

## 2020-01-21 DIAGNOSIS — Z992 Dependence on renal dialysis: Secondary | ICD-10-CM | POA: Diagnosis not present

## 2020-01-21 DIAGNOSIS — D509 Iron deficiency anemia, unspecified: Secondary | ICD-10-CM | POA: Diagnosis not present

## 2020-01-22 DIAGNOSIS — D631 Anemia in chronic kidney disease: Secondary | ICD-10-CM | POA: Diagnosis not present

## 2020-01-22 DIAGNOSIS — D509 Iron deficiency anemia, unspecified: Secondary | ICD-10-CM | POA: Diagnosis not present

## 2020-01-22 DIAGNOSIS — Z992 Dependence on renal dialysis: Secondary | ICD-10-CM | POA: Diagnosis not present

## 2020-01-22 DIAGNOSIS — N186 End stage renal disease: Secondary | ICD-10-CM | POA: Diagnosis not present

## 2020-01-22 DIAGNOSIS — N2581 Secondary hyperparathyroidism of renal origin: Secondary | ICD-10-CM | POA: Diagnosis not present

## 2020-01-23 DIAGNOSIS — Z992 Dependence on renal dialysis: Secondary | ICD-10-CM | POA: Diagnosis not present

## 2020-01-23 DIAGNOSIS — D631 Anemia in chronic kidney disease: Secondary | ICD-10-CM | POA: Diagnosis not present

## 2020-01-23 DIAGNOSIS — D509 Iron deficiency anemia, unspecified: Secondary | ICD-10-CM | POA: Diagnosis not present

## 2020-01-23 DIAGNOSIS — N2581 Secondary hyperparathyroidism of renal origin: Secondary | ICD-10-CM | POA: Diagnosis not present

## 2020-01-23 DIAGNOSIS — N186 End stage renal disease: Secondary | ICD-10-CM | POA: Diagnosis not present

## 2020-01-24 DIAGNOSIS — N2581 Secondary hyperparathyroidism of renal origin: Secondary | ICD-10-CM | POA: Diagnosis not present

## 2020-01-24 DIAGNOSIS — Z992 Dependence on renal dialysis: Secondary | ICD-10-CM | POA: Diagnosis not present

## 2020-01-24 DIAGNOSIS — N186 End stage renal disease: Secondary | ICD-10-CM | POA: Diagnosis not present

## 2020-01-24 DIAGNOSIS — D509 Iron deficiency anemia, unspecified: Secondary | ICD-10-CM | POA: Diagnosis not present

## 2020-01-24 DIAGNOSIS — D631 Anemia in chronic kidney disease: Secondary | ICD-10-CM | POA: Diagnosis not present

## 2020-01-25 DIAGNOSIS — D509 Iron deficiency anemia, unspecified: Secondary | ICD-10-CM | POA: Diagnosis not present

## 2020-01-25 DIAGNOSIS — D631 Anemia in chronic kidney disease: Secondary | ICD-10-CM | POA: Diagnosis not present

## 2020-01-25 DIAGNOSIS — Z992 Dependence on renal dialysis: Secondary | ICD-10-CM | POA: Diagnosis not present

## 2020-01-25 DIAGNOSIS — N186 End stage renal disease: Secondary | ICD-10-CM | POA: Diagnosis not present

## 2020-01-25 DIAGNOSIS — N2581 Secondary hyperparathyroidism of renal origin: Secondary | ICD-10-CM | POA: Diagnosis not present

## 2020-01-26 DIAGNOSIS — I10 Essential (primary) hypertension: Secondary | ICD-10-CM | POA: Diagnosis not present

## 2020-01-26 DIAGNOSIS — N2581 Secondary hyperparathyroidism of renal origin: Secondary | ICD-10-CM | POA: Diagnosis not present

## 2020-01-26 DIAGNOSIS — K219 Gastro-esophageal reflux disease without esophagitis: Secondary | ICD-10-CM | POA: Diagnosis not present

## 2020-01-26 DIAGNOSIS — Z131 Encounter for screening for diabetes mellitus: Secondary | ICD-10-CM | POA: Diagnosis not present

## 2020-01-26 DIAGNOSIS — D509 Iron deficiency anemia, unspecified: Secondary | ICD-10-CM | POA: Diagnosis not present

## 2020-01-26 DIAGNOSIS — D631 Anemia in chronic kidney disease: Secondary | ICD-10-CM | POA: Diagnosis not present

## 2020-01-26 DIAGNOSIS — M546 Pain in thoracic spine: Secondary | ICD-10-CM | POA: Diagnosis not present

## 2020-01-26 DIAGNOSIS — N186 End stage renal disease: Secondary | ICD-10-CM | POA: Diagnosis not present

## 2020-01-26 DIAGNOSIS — Z0000 Encounter for general adult medical examination without abnormal findings: Secondary | ICD-10-CM | POA: Diagnosis not present

## 2020-01-26 DIAGNOSIS — N185 Chronic kidney disease, stage 5: Secondary | ICD-10-CM | POA: Diagnosis not present

## 2020-01-26 DIAGNOSIS — Z992 Dependence on renal dialysis: Secondary | ICD-10-CM | POA: Diagnosis not present

## 2020-01-26 DIAGNOSIS — N401 Enlarged prostate with lower urinary tract symptoms: Secondary | ICD-10-CM | POA: Diagnosis not present

## 2020-01-26 DIAGNOSIS — D508 Other iron deficiency anemias: Secondary | ICD-10-CM | POA: Diagnosis not present

## 2020-01-26 DIAGNOSIS — Z125 Encounter for screening for malignant neoplasm of prostate: Secondary | ICD-10-CM | POA: Diagnosis not present

## 2020-01-27 DIAGNOSIS — N2581 Secondary hyperparathyroidism of renal origin: Secondary | ICD-10-CM | POA: Diagnosis not present

## 2020-01-27 DIAGNOSIS — D509 Iron deficiency anemia, unspecified: Secondary | ICD-10-CM | POA: Diagnosis not present

## 2020-01-27 DIAGNOSIS — N186 End stage renal disease: Secondary | ICD-10-CM | POA: Diagnosis not present

## 2020-01-27 DIAGNOSIS — Z992 Dependence on renal dialysis: Secondary | ICD-10-CM | POA: Diagnosis not present

## 2020-01-27 DIAGNOSIS — D631 Anemia in chronic kidney disease: Secondary | ICD-10-CM | POA: Diagnosis not present

## 2020-01-28 DIAGNOSIS — D509 Iron deficiency anemia, unspecified: Secondary | ICD-10-CM | POA: Diagnosis not present

## 2020-01-28 DIAGNOSIS — Z992 Dependence on renal dialysis: Secondary | ICD-10-CM | POA: Diagnosis not present

## 2020-01-28 DIAGNOSIS — N2581 Secondary hyperparathyroidism of renal origin: Secondary | ICD-10-CM | POA: Diagnosis not present

## 2020-01-28 DIAGNOSIS — N186 End stage renal disease: Secondary | ICD-10-CM | POA: Diagnosis not present

## 2020-01-28 DIAGNOSIS — D631 Anemia in chronic kidney disease: Secondary | ICD-10-CM | POA: Diagnosis not present

## 2020-01-29 DIAGNOSIS — D631 Anemia in chronic kidney disease: Secondary | ICD-10-CM | POA: Diagnosis not present

## 2020-01-29 DIAGNOSIS — N2581 Secondary hyperparathyroidism of renal origin: Secondary | ICD-10-CM | POA: Diagnosis not present

## 2020-01-29 DIAGNOSIS — Z992 Dependence on renal dialysis: Secondary | ICD-10-CM | POA: Diagnosis not present

## 2020-01-29 DIAGNOSIS — D509 Iron deficiency anemia, unspecified: Secondary | ICD-10-CM | POA: Diagnosis not present

## 2020-01-29 DIAGNOSIS — N186 End stage renal disease: Secondary | ICD-10-CM | POA: Diagnosis not present

## 2020-01-30 DIAGNOSIS — Z992 Dependence on renal dialysis: Secondary | ICD-10-CM | POA: Diagnosis not present

## 2020-01-30 DIAGNOSIS — N2581 Secondary hyperparathyroidism of renal origin: Secondary | ICD-10-CM | POA: Diagnosis not present

## 2020-01-30 DIAGNOSIS — D509 Iron deficiency anemia, unspecified: Secondary | ICD-10-CM | POA: Diagnosis not present

## 2020-01-30 DIAGNOSIS — N186 End stage renal disease: Secondary | ICD-10-CM | POA: Diagnosis not present

## 2020-01-30 DIAGNOSIS — D631 Anemia in chronic kidney disease: Secondary | ICD-10-CM | POA: Diagnosis not present

## 2020-01-31 DIAGNOSIS — D631 Anemia in chronic kidney disease: Secondary | ICD-10-CM | POA: Diagnosis not present

## 2020-01-31 DIAGNOSIS — N186 End stage renal disease: Secondary | ICD-10-CM | POA: Diagnosis not present

## 2020-01-31 DIAGNOSIS — Z992 Dependence on renal dialysis: Secondary | ICD-10-CM | POA: Diagnosis not present

## 2020-01-31 DIAGNOSIS — N2581 Secondary hyperparathyroidism of renal origin: Secondary | ICD-10-CM | POA: Diagnosis not present

## 2020-01-31 DIAGNOSIS — D509 Iron deficiency anemia, unspecified: Secondary | ICD-10-CM | POA: Diagnosis not present

## 2020-02-01 DIAGNOSIS — N2581 Secondary hyperparathyroidism of renal origin: Secondary | ICD-10-CM | POA: Diagnosis not present

## 2020-02-01 DIAGNOSIS — D631 Anemia in chronic kidney disease: Secondary | ICD-10-CM | POA: Diagnosis not present

## 2020-02-01 DIAGNOSIS — N186 End stage renal disease: Secondary | ICD-10-CM | POA: Diagnosis not present

## 2020-02-01 DIAGNOSIS — D509 Iron deficiency anemia, unspecified: Secondary | ICD-10-CM | POA: Diagnosis not present

## 2020-02-01 DIAGNOSIS — Z992 Dependence on renal dialysis: Secondary | ICD-10-CM | POA: Diagnosis not present

## 2020-02-02 DIAGNOSIS — N186 End stage renal disease: Secondary | ICD-10-CM | POA: Diagnosis not present

## 2020-02-02 DIAGNOSIS — D631 Anemia in chronic kidney disease: Secondary | ICD-10-CM | POA: Diagnosis not present

## 2020-02-02 DIAGNOSIS — N2581 Secondary hyperparathyroidism of renal origin: Secondary | ICD-10-CM | POA: Diagnosis not present

## 2020-02-02 DIAGNOSIS — D509 Iron deficiency anemia, unspecified: Secondary | ICD-10-CM | POA: Diagnosis not present

## 2020-02-02 DIAGNOSIS — Z992 Dependence on renal dialysis: Secondary | ICD-10-CM | POA: Diagnosis not present

## 2020-02-03 DIAGNOSIS — N186 End stage renal disease: Secondary | ICD-10-CM | POA: Diagnosis not present

## 2020-02-03 DIAGNOSIS — Z992 Dependence on renal dialysis: Secondary | ICD-10-CM | POA: Diagnosis not present

## 2020-02-03 DIAGNOSIS — D631 Anemia in chronic kidney disease: Secondary | ICD-10-CM | POA: Diagnosis not present

## 2020-02-03 DIAGNOSIS — N2581 Secondary hyperparathyroidism of renal origin: Secondary | ICD-10-CM | POA: Diagnosis not present

## 2020-02-03 DIAGNOSIS — D509 Iron deficiency anemia, unspecified: Secondary | ICD-10-CM | POA: Diagnosis not present

## 2020-02-04 DIAGNOSIS — N186 End stage renal disease: Secondary | ICD-10-CM | POA: Diagnosis not present

## 2020-02-04 DIAGNOSIS — Z992 Dependence on renal dialysis: Secondary | ICD-10-CM | POA: Diagnosis not present

## 2020-02-04 DIAGNOSIS — N2581 Secondary hyperparathyroidism of renal origin: Secondary | ICD-10-CM | POA: Diagnosis not present

## 2020-02-04 DIAGNOSIS — D509 Iron deficiency anemia, unspecified: Secondary | ICD-10-CM | POA: Diagnosis not present

## 2020-02-04 DIAGNOSIS — D631 Anemia in chronic kidney disease: Secondary | ICD-10-CM | POA: Diagnosis not present

## 2020-02-05 DIAGNOSIS — D509 Iron deficiency anemia, unspecified: Secondary | ICD-10-CM | POA: Diagnosis not present

## 2020-02-05 DIAGNOSIS — N186 End stage renal disease: Secondary | ICD-10-CM | POA: Diagnosis not present

## 2020-02-05 DIAGNOSIS — D631 Anemia in chronic kidney disease: Secondary | ICD-10-CM | POA: Diagnosis not present

## 2020-02-05 DIAGNOSIS — Z992 Dependence on renal dialysis: Secondary | ICD-10-CM | POA: Diagnosis not present

## 2020-02-05 DIAGNOSIS — N2581 Secondary hyperparathyroidism of renal origin: Secondary | ICD-10-CM | POA: Diagnosis not present

## 2020-02-06 DIAGNOSIS — D509 Iron deficiency anemia, unspecified: Secondary | ICD-10-CM | POA: Diagnosis not present

## 2020-02-06 DIAGNOSIS — D631 Anemia in chronic kidney disease: Secondary | ICD-10-CM | POA: Diagnosis not present

## 2020-02-06 DIAGNOSIS — N186 End stage renal disease: Secondary | ICD-10-CM | POA: Diagnosis not present

## 2020-02-06 DIAGNOSIS — N2581 Secondary hyperparathyroidism of renal origin: Secondary | ICD-10-CM | POA: Diagnosis not present

## 2020-02-06 DIAGNOSIS — Z992 Dependence on renal dialysis: Secondary | ICD-10-CM | POA: Diagnosis not present

## 2020-02-07 DIAGNOSIS — D631 Anemia in chronic kidney disease: Secondary | ICD-10-CM | POA: Diagnosis not present

## 2020-02-07 DIAGNOSIS — D509 Iron deficiency anemia, unspecified: Secondary | ICD-10-CM | POA: Diagnosis not present

## 2020-02-07 DIAGNOSIS — N2581 Secondary hyperparathyroidism of renal origin: Secondary | ICD-10-CM | POA: Diagnosis not present

## 2020-02-07 DIAGNOSIS — N186 End stage renal disease: Secondary | ICD-10-CM | POA: Diagnosis not present

## 2020-02-07 DIAGNOSIS — Z992 Dependence on renal dialysis: Secondary | ICD-10-CM | POA: Diagnosis not present

## 2020-02-08 DIAGNOSIS — N186 End stage renal disease: Secondary | ICD-10-CM | POA: Diagnosis not present

## 2020-02-08 DIAGNOSIS — D631 Anemia in chronic kidney disease: Secondary | ICD-10-CM | POA: Diagnosis not present

## 2020-02-08 DIAGNOSIS — Z992 Dependence on renal dialysis: Secondary | ICD-10-CM | POA: Diagnosis not present

## 2020-02-08 DIAGNOSIS — D509 Iron deficiency anemia, unspecified: Secondary | ICD-10-CM | POA: Diagnosis not present

## 2020-02-08 DIAGNOSIS — N2581 Secondary hyperparathyroidism of renal origin: Secondary | ICD-10-CM | POA: Diagnosis not present

## 2020-02-09 DIAGNOSIS — D631 Anemia in chronic kidney disease: Secondary | ICD-10-CM | POA: Diagnosis not present

## 2020-02-09 DIAGNOSIS — D509 Iron deficiency anemia, unspecified: Secondary | ICD-10-CM | POA: Diagnosis not present

## 2020-02-09 DIAGNOSIS — Z992 Dependence on renal dialysis: Secondary | ICD-10-CM | POA: Diagnosis not present

## 2020-02-09 DIAGNOSIS — N2581 Secondary hyperparathyroidism of renal origin: Secondary | ICD-10-CM | POA: Diagnosis not present

## 2020-02-09 DIAGNOSIS — N186 End stage renal disease: Secondary | ICD-10-CM | POA: Diagnosis not present

## 2020-02-10 DIAGNOSIS — N186 End stage renal disease: Secondary | ICD-10-CM | POA: Diagnosis not present

## 2020-02-10 DIAGNOSIS — D509 Iron deficiency anemia, unspecified: Secondary | ICD-10-CM | POA: Diagnosis not present

## 2020-02-10 DIAGNOSIS — Z992 Dependence on renal dialysis: Secondary | ICD-10-CM | POA: Diagnosis not present

## 2020-02-10 DIAGNOSIS — D631 Anemia in chronic kidney disease: Secondary | ICD-10-CM | POA: Diagnosis not present

## 2020-02-10 DIAGNOSIS — N2581 Secondary hyperparathyroidism of renal origin: Secondary | ICD-10-CM | POA: Diagnosis not present

## 2020-02-11 DIAGNOSIS — N2581 Secondary hyperparathyroidism of renal origin: Secondary | ICD-10-CM | POA: Diagnosis not present

## 2020-02-11 DIAGNOSIS — N186 End stage renal disease: Secondary | ICD-10-CM | POA: Diagnosis not present

## 2020-02-11 DIAGNOSIS — D631 Anemia in chronic kidney disease: Secondary | ICD-10-CM | POA: Diagnosis not present

## 2020-02-11 DIAGNOSIS — Z992 Dependence on renal dialysis: Secondary | ICD-10-CM | POA: Diagnosis not present

## 2020-02-11 DIAGNOSIS — D509 Iron deficiency anemia, unspecified: Secondary | ICD-10-CM | POA: Diagnosis not present

## 2020-02-12 DIAGNOSIS — D631 Anemia in chronic kidney disease: Secondary | ICD-10-CM | POA: Diagnosis not present

## 2020-02-12 DIAGNOSIS — Z992 Dependence on renal dialysis: Secondary | ICD-10-CM | POA: Diagnosis not present

## 2020-02-12 DIAGNOSIS — N186 End stage renal disease: Secondary | ICD-10-CM | POA: Diagnosis not present

## 2020-02-12 DIAGNOSIS — N2581 Secondary hyperparathyroidism of renal origin: Secondary | ICD-10-CM | POA: Diagnosis not present

## 2020-02-12 DIAGNOSIS — D509 Iron deficiency anemia, unspecified: Secondary | ICD-10-CM | POA: Diagnosis not present

## 2020-02-13 DIAGNOSIS — D631 Anemia in chronic kidney disease: Secondary | ICD-10-CM | POA: Diagnosis not present

## 2020-02-13 DIAGNOSIS — N2581 Secondary hyperparathyroidism of renal origin: Secondary | ICD-10-CM | POA: Diagnosis not present

## 2020-02-13 DIAGNOSIS — D509 Iron deficiency anemia, unspecified: Secondary | ICD-10-CM | POA: Diagnosis not present

## 2020-02-13 DIAGNOSIS — Z992 Dependence on renal dialysis: Secondary | ICD-10-CM | POA: Diagnosis not present

## 2020-02-13 DIAGNOSIS — N186 End stage renal disease: Secondary | ICD-10-CM | POA: Diagnosis not present

## 2020-02-14 DIAGNOSIS — D509 Iron deficiency anemia, unspecified: Secondary | ICD-10-CM | POA: Diagnosis not present

## 2020-02-14 DIAGNOSIS — D631 Anemia in chronic kidney disease: Secondary | ICD-10-CM | POA: Diagnosis not present

## 2020-02-14 DIAGNOSIS — N2581 Secondary hyperparathyroidism of renal origin: Secondary | ICD-10-CM | POA: Diagnosis not present

## 2020-02-14 DIAGNOSIS — Z992 Dependence on renal dialysis: Secondary | ICD-10-CM | POA: Diagnosis not present

## 2020-02-14 DIAGNOSIS — N186 End stage renal disease: Secondary | ICD-10-CM | POA: Diagnosis not present

## 2020-02-15 DIAGNOSIS — N2581 Secondary hyperparathyroidism of renal origin: Secondary | ICD-10-CM | POA: Diagnosis not present

## 2020-02-15 DIAGNOSIS — N186 End stage renal disease: Secondary | ICD-10-CM | POA: Diagnosis not present

## 2020-02-15 DIAGNOSIS — D631 Anemia in chronic kidney disease: Secondary | ICD-10-CM | POA: Diagnosis not present

## 2020-02-15 DIAGNOSIS — D509 Iron deficiency anemia, unspecified: Secondary | ICD-10-CM | POA: Diagnosis not present

## 2020-02-15 DIAGNOSIS — Z992 Dependence on renal dialysis: Secondary | ICD-10-CM | POA: Diagnosis not present

## 2020-02-16 DIAGNOSIS — N2581 Secondary hyperparathyroidism of renal origin: Secondary | ICD-10-CM | POA: Diagnosis not present

## 2020-02-16 DIAGNOSIS — Z992 Dependence on renal dialysis: Secondary | ICD-10-CM | POA: Diagnosis not present

## 2020-02-16 DIAGNOSIS — N186 End stage renal disease: Secondary | ICD-10-CM | POA: Diagnosis not present

## 2020-02-16 DIAGNOSIS — D509 Iron deficiency anemia, unspecified: Secondary | ICD-10-CM | POA: Diagnosis not present

## 2020-02-16 DIAGNOSIS — D631 Anemia in chronic kidney disease: Secondary | ICD-10-CM | POA: Diagnosis not present

## 2020-02-17 DIAGNOSIS — N186 End stage renal disease: Secondary | ICD-10-CM | POA: Diagnosis not present

## 2020-02-17 DIAGNOSIS — N2581 Secondary hyperparathyroidism of renal origin: Secondary | ICD-10-CM | POA: Diagnosis not present

## 2020-02-17 DIAGNOSIS — D631 Anemia in chronic kidney disease: Secondary | ICD-10-CM | POA: Diagnosis not present

## 2020-02-17 DIAGNOSIS — D509 Iron deficiency anemia, unspecified: Secondary | ICD-10-CM | POA: Diagnosis not present

## 2020-02-17 DIAGNOSIS — Z992 Dependence on renal dialysis: Secondary | ICD-10-CM | POA: Diagnosis not present

## 2020-02-18 DIAGNOSIS — N2581 Secondary hyperparathyroidism of renal origin: Secondary | ICD-10-CM | POA: Diagnosis not present

## 2020-02-18 DIAGNOSIS — D509 Iron deficiency anemia, unspecified: Secondary | ICD-10-CM | POA: Diagnosis not present

## 2020-02-18 DIAGNOSIS — D631 Anemia in chronic kidney disease: Secondary | ICD-10-CM | POA: Diagnosis not present

## 2020-02-18 DIAGNOSIS — N186 End stage renal disease: Secondary | ICD-10-CM | POA: Diagnosis not present

## 2020-02-18 DIAGNOSIS — Z992 Dependence on renal dialysis: Secondary | ICD-10-CM | POA: Diagnosis not present

## 2020-02-19 DIAGNOSIS — N2581 Secondary hyperparathyroidism of renal origin: Secondary | ICD-10-CM | POA: Diagnosis not present

## 2020-02-19 DIAGNOSIS — D509 Iron deficiency anemia, unspecified: Secondary | ICD-10-CM | POA: Diagnosis not present

## 2020-02-19 DIAGNOSIS — N186 End stage renal disease: Secondary | ICD-10-CM | POA: Diagnosis not present

## 2020-02-19 DIAGNOSIS — D631 Anemia in chronic kidney disease: Secondary | ICD-10-CM | POA: Diagnosis not present

## 2020-02-19 DIAGNOSIS — Z992 Dependence on renal dialysis: Secondary | ICD-10-CM | POA: Diagnosis not present

## 2020-02-20 DIAGNOSIS — N186 End stage renal disease: Secondary | ICD-10-CM | POA: Diagnosis not present

## 2020-02-20 DIAGNOSIS — D509 Iron deficiency anemia, unspecified: Secondary | ICD-10-CM | POA: Diagnosis not present

## 2020-02-20 DIAGNOSIS — Z992 Dependence on renal dialysis: Secondary | ICD-10-CM | POA: Diagnosis not present

## 2020-02-20 DIAGNOSIS — D631 Anemia in chronic kidney disease: Secondary | ICD-10-CM | POA: Diagnosis not present

## 2020-02-21 DIAGNOSIS — D631 Anemia in chronic kidney disease: Secondary | ICD-10-CM | POA: Diagnosis not present

## 2020-02-21 DIAGNOSIS — Z992 Dependence on renal dialysis: Secondary | ICD-10-CM | POA: Diagnosis not present

## 2020-02-21 DIAGNOSIS — N186 End stage renal disease: Secondary | ICD-10-CM | POA: Diagnosis not present

## 2020-02-21 DIAGNOSIS — D509 Iron deficiency anemia, unspecified: Secondary | ICD-10-CM | POA: Diagnosis not present

## 2020-02-22 DIAGNOSIS — N186 End stage renal disease: Secondary | ICD-10-CM | POA: Diagnosis not present

## 2020-02-22 DIAGNOSIS — D509 Iron deficiency anemia, unspecified: Secondary | ICD-10-CM | POA: Diagnosis not present

## 2020-02-22 DIAGNOSIS — D631 Anemia in chronic kidney disease: Secondary | ICD-10-CM | POA: Diagnosis not present

## 2020-02-22 DIAGNOSIS — Z992 Dependence on renal dialysis: Secondary | ICD-10-CM | POA: Diagnosis not present

## 2020-02-23 ENCOUNTER — Other Ambulatory Visit: Payer: Medicare Other

## 2020-02-23 DIAGNOSIS — D631 Anemia in chronic kidney disease: Secondary | ICD-10-CM | POA: Diagnosis not present

## 2020-02-23 DIAGNOSIS — D509 Iron deficiency anemia, unspecified: Secondary | ICD-10-CM | POA: Diagnosis not present

## 2020-02-23 DIAGNOSIS — N186 End stage renal disease: Secondary | ICD-10-CM | POA: Diagnosis not present

## 2020-02-23 DIAGNOSIS — Z992 Dependence on renal dialysis: Secondary | ICD-10-CM | POA: Diagnosis not present

## 2020-02-24 DIAGNOSIS — N186 End stage renal disease: Secondary | ICD-10-CM | POA: Diagnosis not present

## 2020-02-24 DIAGNOSIS — Z992 Dependence on renal dialysis: Secondary | ICD-10-CM | POA: Diagnosis not present

## 2020-02-24 DIAGNOSIS — D509 Iron deficiency anemia, unspecified: Secondary | ICD-10-CM | POA: Diagnosis not present

## 2020-02-24 DIAGNOSIS — D631 Anemia in chronic kidney disease: Secondary | ICD-10-CM | POA: Diagnosis not present

## 2020-02-25 DIAGNOSIS — D631 Anemia in chronic kidney disease: Secondary | ICD-10-CM | POA: Diagnosis not present

## 2020-02-25 DIAGNOSIS — D509 Iron deficiency anemia, unspecified: Secondary | ICD-10-CM | POA: Diagnosis not present

## 2020-02-25 DIAGNOSIS — N186 End stage renal disease: Secondary | ICD-10-CM | POA: Diagnosis not present

## 2020-02-25 DIAGNOSIS — Z992 Dependence on renal dialysis: Secondary | ICD-10-CM | POA: Diagnosis not present

## 2020-02-26 DIAGNOSIS — D631 Anemia in chronic kidney disease: Secondary | ICD-10-CM | POA: Diagnosis not present

## 2020-02-26 DIAGNOSIS — Z992 Dependence on renal dialysis: Secondary | ICD-10-CM | POA: Diagnosis not present

## 2020-02-26 DIAGNOSIS — N186 End stage renal disease: Secondary | ICD-10-CM | POA: Diagnosis not present

## 2020-02-26 DIAGNOSIS — D509 Iron deficiency anemia, unspecified: Secondary | ICD-10-CM | POA: Diagnosis not present

## 2020-02-27 DIAGNOSIS — D509 Iron deficiency anemia, unspecified: Secondary | ICD-10-CM | POA: Diagnosis not present

## 2020-02-27 DIAGNOSIS — Z992 Dependence on renal dialysis: Secondary | ICD-10-CM | POA: Diagnosis not present

## 2020-02-27 DIAGNOSIS — D631 Anemia in chronic kidney disease: Secondary | ICD-10-CM | POA: Diagnosis not present

## 2020-02-27 DIAGNOSIS — N186 End stage renal disease: Secondary | ICD-10-CM | POA: Diagnosis not present

## 2020-02-28 DIAGNOSIS — N186 End stage renal disease: Secondary | ICD-10-CM | POA: Diagnosis not present

## 2020-02-28 DIAGNOSIS — Z992 Dependence on renal dialysis: Secondary | ICD-10-CM | POA: Diagnosis not present

## 2020-02-28 DIAGNOSIS — D509 Iron deficiency anemia, unspecified: Secondary | ICD-10-CM | POA: Diagnosis not present

## 2020-02-28 DIAGNOSIS — D631 Anemia in chronic kidney disease: Secondary | ICD-10-CM | POA: Diagnosis not present

## 2020-02-29 DIAGNOSIS — D631 Anemia in chronic kidney disease: Secondary | ICD-10-CM | POA: Diagnosis not present

## 2020-02-29 DIAGNOSIS — Z992 Dependence on renal dialysis: Secondary | ICD-10-CM | POA: Diagnosis not present

## 2020-02-29 DIAGNOSIS — D509 Iron deficiency anemia, unspecified: Secondary | ICD-10-CM | POA: Diagnosis not present

## 2020-02-29 DIAGNOSIS — N186 End stage renal disease: Secondary | ICD-10-CM | POA: Diagnosis not present

## 2020-03-01 DIAGNOSIS — N186 End stage renal disease: Secondary | ICD-10-CM | POA: Diagnosis not present

## 2020-03-01 DIAGNOSIS — D631 Anemia in chronic kidney disease: Secondary | ICD-10-CM | POA: Diagnosis not present

## 2020-03-01 DIAGNOSIS — D509 Iron deficiency anemia, unspecified: Secondary | ICD-10-CM | POA: Diagnosis not present

## 2020-03-01 DIAGNOSIS — Z992 Dependence on renal dialysis: Secondary | ICD-10-CM | POA: Diagnosis not present

## 2020-03-02 DIAGNOSIS — Z992 Dependence on renal dialysis: Secondary | ICD-10-CM | POA: Diagnosis not present

## 2020-03-02 DIAGNOSIS — D631 Anemia in chronic kidney disease: Secondary | ICD-10-CM | POA: Diagnosis not present

## 2020-03-02 DIAGNOSIS — D509 Iron deficiency anemia, unspecified: Secondary | ICD-10-CM | POA: Diagnosis not present

## 2020-03-02 DIAGNOSIS — N186 End stage renal disease: Secondary | ICD-10-CM | POA: Diagnosis not present

## 2020-03-03 DIAGNOSIS — D509 Iron deficiency anemia, unspecified: Secondary | ICD-10-CM | POA: Diagnosis not present

## 2020-03-03 DIAGNOSIS — D631 Anemia in chronic kidney disease: Secondary | ICD-10-CM | POA: Diagnosis not present

## 2020-03-03 DIAGNOSIS — Z992 Dependence on renal dialysis: Secondary | ICD-10-CM | POA: Diagnosis not present

## 2020-03-03 DIAGNOSIS — N186 End stage renal disease: Secondary | ICD-10-CM | POA: Diagnosis not present

## 2020-03-04 DIAGNOSIS — D631 Anemia in chronic kidney disease: Secondary | ICD-10-CM | POA: Diagnosis not present

## 2020-03-04 DIAGNOSIS — N186 End stage renal disease: Secondary | ICD-10-CM | POA: Diagnosis not present

## 2020-03-04 DIAGNOSIS — D509 Iron deficiency anemia, unspecified: Secondary | ICD-10-CM | POA: Diagnosis not present

## 2020-03-04 DIAGNOSIS — Z992 Dependence on renal dialysis: Secondary | ICD-10-CM | POA: Diagnosis not present

## 2020-03-05 DIAGNOSIS — D509 Iron deficiency anemia, unspecified: Secondary | ICD-10-CM | POA: Diagnosis not present

## 2020-03-05 DIAGNOSIS — N186 End stage renal disease: Secondary | ICD-10-CM | POA: Diagnosis not present

## 2020-03-05 DIAGNOSIS — Z992 Dependence on renal dialysis: Secondary | ICD-10-CM | POA: Diagnosis not present

## 2020-03-05 DIAGNOSIS — D631 Anemia in chronic kidney disease: Secondary | ICD-10-CM | POA: Diagnosis not present

## 2020-03-06 DIAGNOSIS — D631 Anemia in chronic kidney disease: Secondary | ICD-10-CM | POA: Diagnosis not present

## 2020-03-06 DIAGNOSIS — D509 Iron deficiency anemia, unspecified: Secondary | ICD-10-CM | POA: Diagnosis not present

## 2020-03-06 DIAGNOSIS — Z992 Dependence on renal dialysis: Secondary | ICD-10-CM | POA: Diagnosis not present

## 2020-03-06 DIAGNOSIS — N186 End stage renal disease: Secondary | ICD-10-CM | POA: Diagnosis not present

## 2020-03-07 DIAGNOSIS — D509 Iron deficiency anemia, unspecified: Secondary | ICD-10-CM | POA: Diagnosis not present

## 2020-03-07 DIAGNOSIS — Z992 Dependence on renal dialysis: Secondary | ICD-10-CM | POA: Diagnosis not present

## 2020-03-07 DIAGNOSIS — D631 Anemia in chronic kidney disease: Secondary | ICD-10-CM | POA: Diagnosis not present

## 2020-03-07 DIAGNOSIS — N186 End stage renal disease: Secondary | ICD-10-CM | POA: Diagnosis not present

## 2020-03-08 DIAGNOSIS — D631 Anemia in chronic kidney disease: Secondary | ICD-10-CM | POA: Diagnosis not present

## 2020-03-08 DIAGNOSIS — N186 End stage renal disease: Secondary | ICD-10-CM | POA: Diagnosis not present

## 2020-03-08 DIAGNOSIS — D509 Iron deficiency anemia, unspecified: Secondary | ICD-10-CM | POA: Diagnosis not present

## 2020-03-08 DIAGNOSIS — Z992 Dependence on renal dialysis: Secondary | ICD-10-CM | POA: Diagnosis not present

## 2020-03-09 DIAGNOSIS — D631 Anemia in chronic kidney disease: Secondary | ICD-10-CM | POA: Diagnosis not present

## 2020-03-09 DIAGNOSIS — D509 Iron deficiency anemia, unspecified: Secondary | ICD-10-CM | POA: Diagnosis not present

## 2020-03-09 DIAGNOSIS — N186 End stage renal disease: Secondary | ICD-10-CM | POA: Diagnosis not present

## 2020-03-09 DIAGNOSIS — Z992 Dependence on renal dialysis: Secondary | ICD-10-CM | POA: Diagnosis not present

## 2020-03-10 DIAGNOSIS — Z992 Dependence on renal dialysis: Secondary | ICD-10-CM | POA: Diagnosis not present

## 2020-03-10 DIAGNOSIS — D509 Iron deficiency anemia, unspecified: Secondary | ICD-10-CM | POA: Diagnosis not present

## 2020-03-10 DIAGNOSIS — D631 Anemia in chronic kidney disease: Secondary | ICD-10-CM | POA: Diagnosis not present

## 2020-03-10 DIAGNOSIS — N186 End stage renal disease: Secondary | ICD-10-CM | POA: Diagnosis not present

## 2020-03-11 DIAGNOSIS — N186 End stage renal disease: Secondary | ICD-10-CM | POA: Diagnosis not present

## 2020-03-11 DIAGNOSIS — Z992 Dependence on renal dialysis: Secondary | ICD-10-CM | POA: Diagnosis not present

## 2020-03-11 DIAGNOSIS — D631 Anemia in chronic kidney disease: Secondary | ICD-10-CM | POA: Diagnosis not present

## 2020-03-11 DIAGNOSIS — D509 Iron deficiency anemia, unspecified: Secondary | ICD-10-CM | POA: Diagnosis not present

## 2020-03-12 DIAGNOSIS — Z992 Dependence on renal dialysis: Secondary | ICD-10-CM | POA: Diagnosis not present

## 2020-03-12 DIAGNOSIS — D631 Anemia in chronic kidney disease: Secondary | ICD-10-CM | POA: Diagnosis not present

## 2020-03-12 DIAGNOSIS — N186 End stage renal disease: Secondary | ICD-10-CM | POA: Diagnosis not present

## 2020-03-12 DIAGNOSIS — D509 Iron deficiency anemia, unspecified: Secondary | ICD-10-CM | POA: Diagnosis not present

## 2020-03-13 DIAGNOSIS — Z992 Dependence on renal dialysis: Secondary | ICD-10-CM | POA: Diagnosis not present

## 2020-03-13 DIAGNOSIS — D509 Iron deficiency anemia, unspecified: Secondary | ICD-10-CM | POA: Diagnosis not present

## 2020-03-13 DIAGNOSIS — N186 End stage renal disease: Secondary | ICD-10-CM | POA: Diagnosis not present

## 2020-03-13 DIAGNOSIS — D631 Anemia in chronic kidney disease: Secondary | ICD-10-CM | POA: Diagnosis not present

## 2020-03-14 ENCOUNTER — Ambulatory Visit (INDEPENDENT_AMBULATORY_CARE_PROVIDER_SITE_OTHER): Payer: Medicare Other

## 2020-03-14 DIAGNOSIS — I351 Nonrheumatic aortic (valve) insufficiency: Secondary | ICD-10-CM

## 2020-03-14 DIAGNOSIS — D509 Iron deficiency anemia, unspecified: Secondary | ICD-10-CM | POA: Diagnosis not present

## 2020-03-14 DIAGNOSIS — Z992 Dependence on renal dialysis: Secondary | ICD-10-CM | POA: Diagnosis not present

## 2020-03-14 DIAGNOSIS — N186 End stage renal disease: Secondary | ICD-10-CM | POA: Diagnosis not present

## 2020-03-14 DIAGNOSIS — D631 Anemia in chronic kidney disease: Secondary | ICD-10-CM | POA: Diagnosis not present

## 2020-03-14 LAB — ECHOCARDIOGRAM COMPLETE
Area-P 1/2: 2.26 cm2
Calc EF: 81.2 %
MV M vel: 4.62 m/s
MV Peak grad: 85.5 mmHg
P 1/2 time: 865 msec
S' Lateral: 3.26 cm
Single Plane A2C EF: 80.1 %
Single Plane A4C EF: 81.7 %

## 2020-03-15 DIAGNOSIS — Z992 Dependence on renal dialysis: Secondary | ICD-10-CM | POA: Diagnosis not present

## 2020-03-15 DIAGNOSIS — D509 Iron deficiency anemia, unspecified: Secondary | ICD-10-CM | POA: Diagnosis not present

## 2020-03-15 DIAGNOSIS — N186 End stage renal disease: Secondary | ICD-10-CM | POA: Diagnosis not present

## 2020-03-15 DIAGNOSIS — D631 Anemia in chronic kidney disease: Secondary | ICD-10-CM | POA: Diagnosis not present

## 2020-03-16 DIAGNOSIS — D631 Anemia in chronic kidney disease: Secondary | ICD-10-CM | POA: Diagnosis not present

## 2020-03-16 DIAGNOSIS — D509 Iron deficiency anemia, unspecified: Secondary | ICD-10-CM | POA: Diagnosis not present

## 2020-03-16 DIAGNOSIS — N186 End stage renal disease: Secondary | ICD-10-CM | POA: Diagnosis not present

## 2020-03-16 DIAGNOSIS — Z992 Dependence on renal dialysis: Secondary | ICD-10-CM | POA: Diagnosis not present

## 2020-03-17 ENCOUNTER — Telehealth: Payer: Self-pay | Admitting: *Deleted

## 2020-03-17 DIAGNOSIS — N186 End stage renal disease: Secondary | ICD-10-CM | POA: Diagnosis not present

## 2020-03-17 DIAGNOSIS — Z992 Dependence on renal dialysis: Secondary | ICD-10-CM | POA: Diagnosis not present

## 2020-03-17 DIAGNOSIS — D509 Iron deficiency anemia, unspecified: Secondary | ICD-10-CM | POA: Diagnosis not present

## 2020-03-17 DIAGNOSIS — D631 Anemia in chronic kidney disease: Secondary | ICD-10-CM | POA: Diagnosis not present

## 2020-03-17 NOTE — Telephone Encounter (Signed)
Patient informed. Copy sent to PCP °

## 2020-03-17 NOTE — Telephone Encounter (Signed)
-----   Message from Satira Sark, MD sent at 03/14/2020 11:04 AM EDT ----- Results reviewed.  Vigorous LVEF and stable moderate aortic regurgitation.  Keep scheduled follow-up.

## 2020-03-18 DIAGNOSIS — D631 Anemia in chronic kidney disease: Secondary | ICD-10-CM | POA: Diagnosis not present

## 2020-03-18 DIAGNOSIS — N186 End stage renal disease: Secondary | ICD-10-CM | POA: Diagnosis not present

## 2020-03-18 DIAGNOSIS — Z992 Dependence on renal dialysis: Secondary | ICD-10-CM | POA: Diagnosis not present

## 2020-03-18 DIAGNOSIS — D509 Iron deficiency anemia, unspecified: Secondary | ICD-10-CM | POA: Diagnosis not present

## 2020-03-19 DIAGNOSIS — D509 Iron deficiency anemia, unspecified: Secondary | ICD-10-CM | POA: Diagnosis not present

## 2020-03-19 DIAGNOSIS — N186 End stage renal disease: Secondary | ICD-10-CM | POA: Diagnosis not present

## 2020-03-19 DIAGNOSIS — D631 Anemia in chronic kidney disease: Secondary | ICD-10-CM | POA: Diagnosis not present

## 2020-03-19 DIAGNOSIS — Z992 Dependence on renal dialysis: Secondary | ICD-10-CM | POA: Diagnosis not present

## 2020-03-20 DIAGNOSIS — Z992 Dependence on renal dialysis: Secondary | ICD-10-CM | POA: Diagnosis not present

## 2020-03-20 DIAGNOSIS — D509 Iron deficiency anemia, unspecified: Secondary | ICD-10-CM | POA: Diagnosis not present

## 2020-03-20 DIAGNOSIS — D631 Anemia in chronic kidney disease: Secondary | ICD-10-CM | POA: Diagnosis not present

## 2020-03-20 DIAGNOSIS — N186 End stage renal disease: Secondary | ICD-10-CM | POA: Diagnosis not present

## 2020-03-21 DIAGNOSIS — N186 End stage renal disease: Secondary | ICD-10-CM | POA: Diagnosis not present

## 2020-03-21 DIAGNOSIS — D631 Anemia in chronic kidney disease: Secondary | ICD-10-CM | POA: Diagnosis not present

## 2020-03-21 DIAGNOSIS — D509 Iron deficiency anemia, unspecified: Secondary | ICD-10-CM | POA: Diagnosis not present

## 2020-03-21 DIAGNOSIS — Z992 Dependence on renal dialysis: Secondary | ICD-10-CM | POA: Diagnosis not present

## 2020-03-22 DIAGNOSIS — Z992 Dependence on renal dialysis: Secondary | ICD-10-CM | POA: Diagnosis not present

## 2020-03-22 DIAGNOSIS — D631 Anemia in chronic kidney disease: Secondary | ICD-10-CM | POA: Diagnosis not present

## 2020-03-22 DIAGNOSIS — D509 Iron deficiency anemia, unspecified: Secondary | ICD-10-CM | POA: Diagnosis not present

## 2020-03-22 DIAGNOSIS — N186 End stage renal disease: Secondary | ICD-10-CM | POA: Diagnosis not present

## 2020-03-23 DIAGNOSIS — D509 Iron deficiency anemia, unspecified: Secondary | ICD-10-CM | POA: Diagnosis not present

## 2020-03-23 DIAGNOSIS — D631 Anemia in chronic kidney disease: Secondary | ICD-10-CM | POA: Diagnosis not present

## 2020-03-23 DIAGNOSIS — N186 End stage renal disease: Secondary | ICD-10-CM | POA: Diagnosis not present

## 2020-03-23 DIAGNOSIS — Z992 Dependence on renal dialysis: Secondary | ICD-10-CM | POA: Diagnosis not present

## 2020-03-24 DIAGNOSIS — N186 End stage renal disease: Secondary | ICD-10-CM | POA: Diagnosis not present

## 2020-03-24 DIAGNOSIS — D509 Iron deficiency anemia, unspecified: Secondary | ICD-10-CM | POA: Diagnosis not present

## 2020-03-24 DIAGNOSIS — Z992 Dependence on renal dialysis: Secondary | ICD-10-CM | POA: Diagnosis not present

## 2020-03-24 DIAGNOSIS — D631 Anemia in chronic kidney disease: Secondary | ICD-10-CM | POA: Diagnosis not present

## 2020-03-25 DIAGNOSIS — Z992 Dependence on renal dialysis: Secondary | ICD-10-CM | POA: Diagnosis not present

## 2020-03-25 DIAGNOSIS — N186 End stage renal disease: Secondary | ICD-10-CM | POA: Diagnosis not present

## 2020-03-25 DIAGNOSIS — D631 Anemia in chronic kidney disease: Secondary | ICD-10-CM | POA: Diagnosis not present

## 2020-03-25 DIAGNOSIS — D509 Iron deficiency anemia, unspecified: Secondary | ICD-10-CM | POA: Diagnosis not present

## 2020-03-26 DIAGNOSIS — D509 Iron deficiency anemia, unspecified: Secondary | ICD-10-CM | POA: Diagnosis not present

## 2020-03-26 DIAGNOSIS — Z992 Dependence on renal dialysis: Secondary | ICD-10-CM | POA: Diagnosis not present

## 2020-03-26 DIAGNOSIS — N186 End stage renal disease: Secondary | ICD-10-CM | POA: Diagnosis not present

## 2020-03-26 DIAGNOSIS — D631 Anemia in chronic kidney disease: Secondary | ICD-10-CM | POA: Diagnosis not present

## 2020-03-27 DIAGNOSIS — Z992 Dependence on renal dialysis: Secondary | ICD-10-CM | POA: Diagnosis not present

## 2020-03-27 DIAGNOSIS — N186 End stage renal disease: Secondary | ICD-10-CM | POA: Diagnosis not present

## 2020-03-27 DIAGNOSIS — D509 Iron deficiency anemia, unspecified: Secondary | ICD-10-CM | POA: Diagnosis not present

## 2020-03-27 DIAGNOSIS — D631 Anemia in chronic kidney disease: Secondary | ICD-10-CM | POA: Diagnosis not present

## 2020-03-28 DIAGNOSIS — N186 End stage renal disease: Secondary | ICD-10-CM | POA: Diagnosis not present

## 2020-03-28 DIAGNOSIS — Z992 Dependence on renal dialysis: Secondary | ICD-10-CM | POA: Diagnosis not present

## 2020-03-28 DIAGNOSIS — D509 Iron deficiency anemia, unspecified: Secondary | ICD-10-CM | POA: Diagnosis not present

## 2020-03-28 DIAGNOSIS — D631 Anemia in chronic kidney disease: Secondary | ICD-10-CM | POA: Diagnosis not present

## 2020-03-29 DIAGNOSIS — D509 Iron deficiency anemia, unspecified: Secondary | ICD-10-CM | POA: Diagnosis not present

## 2020-03-29 DIAGNOSIS — D631 Anemia in chronic kidney disease: Secondary | ICD-10-CM | POA: Diagnosis not present

## 2020-03-29 DIAGNOSIS — N186 End stage renal disease: Secondary | ICD-10-CM | POA: Diagnosis not present

## 2020-03-29 DIAGNOSIS — Z992 Dependence on renal dialysis: Secondary | ICD-10-CM | POA: Diagnosis not present

## 2020-03-30 DIAGNOSIS — Z992 Dependence on renal dialysis: Secondary | ICD-10-CM | POA: Diagnosis not present

## 2020-03-30 DIAGNOSIS — D631 Anemia in chronic kidney disease: Secondary | ICD-10-CM | POA: Diagnosis not present

## 2020-03-30 DIAGNOSIS — N186 End stage renal disease: Secondary | ICD-10-CM | POA: Diagnosis not present

## 2020-03-30 DIAGNOSIS — D509 Iron deficiency anemia, unspecified: Secondary | ICD-10-CM | POA: Diagnosis not present

## 2020-03-31 DIAGNOSIS — Z992 Dependence on renal dialysis: Secondary | ICD-10-CM | POA: Diagnosis not present

## 2020-03-31 DIAGNOSIS — D631 Anemia in chronic kidney disease: Secondary | ICD-10-CM | POA: Diagnosis not present

## 2020-03-31 DIAGNOSIS — N186 End stage renal disease: Secondary | ICD-10-CM | POA: Diagnosis not present

## 2020-03-31 DIAGNOSIS — D509 Iron deficiency anemia, unspecified: Secondary | ICD-10-CM | POA: Diagnosis not present

## 2020-04-01 DIAGNOSIS — D631 Anemia in chronic kidney disease: Secondary | ICD-10-CM | POA: Diagnosis not present

## 2020-04-01 DIAGNOSIS — D509 Iron deficiency anemia, unspecified: Secondary | ICD-10-CM | POA: Diagnosis not present

## 2020-04-01 DIAGNOSIS — N186 End stage renal disease: Secondary | ICD-10-CM | POA: Diagnosis not present

## 2020-04-01 DIAGNOSIS — Z992 Dependence on renal dialysis: Secondary | ICD-10-CM | POA: Diagnosis not present

## 2020-04-02 DIAGNOSIS — N186 End stage renal disease: Secondary | ICD-10-CM | POA: Diagnosis not present

## 2020-04-02 DIAGNOSIS — D631 Anemia in chronic kidney disease: Secondary | ICD-10-CM | POA: Diagnosis not present

## 2020-04-02 DIAGNOSIS — D509 Iron deficiency anemia, unspecified: Secondary | ICD-10-CM | POA: Diagnosis not present

## 2020-04-02 DIAGNOSIS — Z992 Dependence on renal dialysis: Secondary | ICD-10-CM | POA: Diagnosis not present

## 2020-04-03 DIAGNOSIS — D509 Iron deficiency anemia, unspecified: Secondary | ICD-10-CM | POA: Diagnosis not present

## 2020-04-03 DIAGNOSIS — N186 End stage renal disease: Secondary | ICD-10-CM | POA: Diagnosis not present

## 2020-04-03 DIAGNOSIS — D631 Anemia in chronic kidney disease: Secondary | ICD-10-CM | POA: Diagnosis not present

## 2020-04-03 DIAGNOSIS — Z992 Dependence on renal dialysis: Secondary | ICD-10-CM | POA: Diagnosis not present

## 2020-04-04 DIAGNOSIS — D631 Anemia in chronic kidney disease: Secondary | ICD-10-CM | POA: Diagnosis not present

## 2020-04-04 DIAGNOSIS — D509 Iron deficiency anemia, unspecified: Secondary | ICD-10-CM | POA: Diagnosis not present

## 2020-04-04 DIAGNOSIS — N186 End stage renal disease: Secondary | ICD-10-CM | POA: Diagnosis not present

## 2020-04-04 DIAGNOSIS — Z992 Dependence on renal dialysis: Secondary | ICD-10-CM | POA: Diagnosis not present

## 2020-04-05 DIAGNOSIS — N186 End stage renal disease: Secondary | ICD-10-CM | POA: Diagnosis not present

## 2020-04-05 DIAGNOSIS — D509 Iron deficiency anemia, unspecified: Secondary | ICD-10-CM | POA: Diagnosis not present

## 2020-04-05 DIAGNOSIS — Z992 Dependence on renal dialysis: Secondary | ICD-10-CM | POA: Diagnosis not present

## 2020-04-05 DIAGNOSIS — D631 Anemia in chronic kidney disease: Secondary | ICD-10-CM | POA: Diagnosis not present

## 2020-04-06 DIAGNOSIS — N186 End stage renal disease: Secondary | ICD-10-CM | POA: Diagnosis not present

## 2020-04-06 DIAGNOSIS — Z992 Dependence on renal dialysis: Secondary | ICD-10-CM | POA: Diagnosis not present

## 2020-04-06 DIAGNOSIS — D631 Anemia in chronic kidney disease: Secondary | ICD-10-CM | POA: Diagnosis not present

## 2020-04-06 DIAGNOSIS — D509 Iron deficiency anemia, unspecified: Secondary | ICD-10-CM | POA: Diagnosis not present

## 2020-04-07 DIAGNOSIS — N186 End stage renal disease: Secondary | ICD-10-CM | POA: Diagnosis not present

## 2020-04-07 DIAGNOSIS — D509 Iron deficiency anemia, unspecified: Secondary | ICD-10-CM | POA: Diagnosis not present

## 2020-04-07 DIAGNOSIS — Z992 Dependence on renal dialysis: Secondary | ICD-10-CM | POA: Diagnosis not present

## 2020-04-07 DIAGNOSIS — D631 Anemia in chronic kidney disease: Secondary | ICD-10-CM | POA: Diagnosis not present

## 2020-04-08 DIAGNOSIS — D509 Iron deficiency anemia, unspecified: Secondary | ICD-10-CM | POA: Diagnosis not present

## 2020-04-08 DIAGNOSIS — Z992 Dependence on renal dialysis: Secondary | ICD-10-CM | POA: Diagnosis not present

## 2020-04-08 DIAGNOSIS — D631 Anemia in chronic kidney disease: Secondary | ICD-10-CM | POA: Diagnosis not present

## 2020-04-08 DIAGNOSIS — N186 End stage renal disease: Secondary | ICD-10-CM | POA: Diagnosis not present

## 2020-04-09 DIAGNOSIS — Z992 Dependence on renal dialysis: Secondary | ICD-10-CM | POA: Diagnosis not present

## 2020-04-09 DIAGNOSIS — D631 Anemia in chronic kidney disease: Secondary | ICD-10-CM | POA: Diagnosis not present

## 2020-04-09 DIAGNOSIS — D509 Iron deficiency anemia, unspecified: Secondary | ICD-10-CM | POA: Diagnosis not present

## 2020-04-09 DIAGNOSIS — N186 End stage renal disease: Secondary | ICD-10-CM | POA: Diagnosis not present

## 2020-04-10 DIAGNOSIS — Z992 Dependence on renal dialysis: Secondary | ICD-10-CM | POA: Diagnosis not present

## 2020-04-10 DIAGNOSIS — D509 Iron deficiency anemia, unspecified: Secondary | ICD-10-CM | POA: Diagnosis not present

## 2020-04-10 DIAGNOSIS — D631 Anemia in chronic kidney disease: Secondary | ICD-10-CM | POA: Diagnosis not present

## 2020-04-10 DIAGNOSIS — N186 End stage renal disease: Secondary | ICD-10-CM | POA: Diagnosis not present

## 2020-04-11 DIAGNOSIS — Z992 Dependence on renal dialysis: Secondary | ICD-10-CM | POA: Diagnosis not present

## 2020-04-11 DIAGNOSIS — D631 Anemia in chronic kidney disease: Secondary | ICD-10-CM | POA: Diagnosis not present

## 2020-04-11 DIAGNOSIS — D509 Iron deficiency anemia, unspecified: Secondary | ICD-10-CM | POA: Diagnosis not present

## 2020-04-11 DIAGNOSIS — N186 End stage renal disease: Secondary | ICD-10-CM | POA: Diagnosis not present

## 2020-04-12 DIAGNOSIS — Z992 Dependence on renal dialysis: Secondary | ICD-10-CM | POA: Diagnosis not present

## 2020-04-12 DIAGNOSIS — D631 Anemia in chronic kidney disease: Secondary | ICD-10-CM | POA: Diagnosis not present

## 2020-04-12 DIAGNOSIS — N186 End stage renal disease: Secondary | ICD-10-CM | POA: Diagnosis not present

## 2020-04-12 DIAGNOSIS — D509 Iron deficiency anemia, unspecified: Secondary | ICD-10-CM | POA: Diagnosis not present

## 2020-04-13 DIAGNOSIS — N186 End stage renal disease: Secondary | ICD-10-CM | POA: Diagnosis not present

## 2020-04-13 DIAGNOSIS — D631 Anemia in chronic kidney disease: Secondary | ICD-10-CM | POA: Diagnosis not present

## 2020-04-13 DIAGNOSIS — Z992 Dependence on renal dialysis: Secondary | ICD-10-CM | POA: Diagnosis not present

## 2020-04-13 DIAGNOSIS — D509 Iron deficiency anemia, unspecified: Secondary | ICD-10-CM | POA: Diagnosis not present

## 2020-04-14 DIAGNOSIS — N186 End stage renal disease: Secondary | ICD-10-CM | POA: Diagnosis not present

## 2020-04-14 DIAGNOSIS — D509 Iron deficiency anemia, unspecified: Secondary | ICD-10-CM | POA: Diagnosis not present

## 2020-04-14 DIAGNOSIS — D631 Anemia in chronic kidney disease: Secondary | ICD-10-CM | POA: Diagnosis not present

## 2020-04-14 DIAGNOSIS — Z992 Dependence on renal dialysis: Secondary | ICD-10-CM | POA: Diagnosis not present

## 2020-04-15 DIAGNOSIS — Z992 Dependence on renal dialysis: Secondary | ICD-10-CM | POA: Diagnosis not present

## 2020-04-15 DIAGNOSIS — D509 Iron deficiency anemia, unspecified: Secondary | ICD-10-CM | POA: Diagnosis not present

## 2020-04-15 DIAGNOSIS — N186 End stage renal disease: Secondary | ICD-10-CM | POA: Diagnosis not present

## 2020-04-15 DIAGNOSIS — D631 Anemia in chronic kidney disease: Secondary | ICD-10-CM | POA: Diagnosis not present

## 2020-04-16 DIAGNOSIS — N186 End stage renal disease: Secondary | ICD-10-CM | POA: Diagnosis not present

## 2020-04-16 DIAGNOSIS — D631 Anemia in chronic kidney disease: Secondary | ICD-10-CM | POA: Diagnosis not present

## 2020-04-16 DIAGNOSIS — Z992 Dependence on renal dialysis: Secondary | ICD-10-CM | POA: Diagnosis not present

## 2020-04-16 DIAGNOSIS — D509 Iron deficiency anemia, unspecified: Secondary | ICD-10-CM | POA: Diagnosis not present

## 2020-04-17 DIAGNOSIS — D509 Iron deficiency anemia, unspecified: Secondary | ICD-10-CM | POA: Diagnosis not present

## 2020-04-17 DIAGNOSIS — Z992 Dependence on renal dialysis: Secondary | ICD-10-CM | POA: Diagnosis not present

## 2020-04-17 DIAGNOSIS — D631 Anemia in chronic kidney disease: Secondary | ICD-10-CM | POA: Diagnosis not present

## 2020-04-17 DIAGNOSIS — N186 End stage renal disease: Secondary | ICD-10-CM | POA: Diagnosis not present

## 2020-04-18 DIAGNOSIS — D631 Anemia in chronic kidney disease: Secondary | ICD-10-CM | POA: Diagnosis not present

## 2020-04-18 DIAGNOSIS — Z992 Dependence on renal dialysis: Secondary | ICD-10-CM | POA: Diagnosis not present

## 2020-04-18 DIAGNOSIS — D509 Iron deficiency anemia, unspecified: Secondary | ICD-10-CM | POA: Diagnosis not present

## 2020-04-18 DIAGNOSIS — N186 End stage renal disease: Secondary | ICD-10-CM | POA: Diagnosis not present

## 2020-04-19 DIAGNOSIS — D631 Anemia in chronic kidney disease: Secondary | ICD-10-CM | POA: Diagnosis not present

## 2020-04-19 DIAGNOSIS — N186 End stage renal disease: Secondary | ICD-10-CM | POA: Diagnosis not present

## 2020-04-19 DIAGNOSIS — Z992 Dependence on renal dialysis: Secondary | ICD-10-CM | POA: Diagnosis not present

## 2020-04-19 DIAGNOSIS — D509 Iron deficiency anemia, unspecified: Secondary | ICD-10-CM | POA: Diagnosis not present

## 2020-04-20 DIAGNOSIS — D509 Iron deficiency anemia, unspecified: Secondary | ICD-10-CM | POA: Diagnosis not present

## 2020-04-20 DIAGNOSIS — Z992 Dependence on renal dialysis: Secondary | ICD-10-CM | POA: Diagnosis not present

## 2020-04-20 DIAGNOSIS — N186 End stage renal disease: Secondary | ICD-10-CM | POA: Diagnosis not present

## 2020-04-20 DIAGNOSIS — D631 Anemia in chronic kidney disease: Secondary | ICD-10-CM | POA: Diagnosis not present

## 2020-04-21 DIAGNOSIS — Z992 Dependence on renal dialysis: Secondary | ICD-10-CM | POA: Diagnosis not present

## 2020-04-21 DIAGNOSIS — D631 Anemia in chronic kidney disease: Secondary | ICD-10-CM | POA: Diagnosis not present

## 2020-04-21 DIAGNOSIS — N186 End stage renal disease: Secondary | ICD-10-CM | POA: Diagnosis not present

## 2020-04-21 DIAGNOSIS — N2581 Secondary hyperparathyroidism of renal origin: Secondary | ICD-10-CM | POA: Diagnosis not present

## 2020-04-21 DIAGNOSIS — D509 Iron deficiency anemia, unspecified: Secondary | ICD-10-CM | POA: Diagnosis not present

## 2020-04-22 DIAGNOSIS — D509 Iron deficiency anemia, unspecified: Secondary | ICD-10-CM | POA: Diagnosis not present

## 2020-04-22 DIAGNOSIS — N186 End stage renal disease: Secondary | ICD-10-CM | POA: Diagnosis not present

## 2020-04-22 DIAGNOSIS — Z992 Dependence on renal dialysis: Secondary | ICD-10-CM | POA: Diagnosis not present

## 2020-04-22 DIAGNOSIS — D631 Anemia in chronic kidney disease: Secondary | ICD-10-CM | POA: Diagnosis not present

## 2020-04-22 DIAGNOSIS — N2581 Secondary hyperparathyroidism of renal origin: Secondary | ICD-10-CM | POA: Diagnosis not present

## 2020-04-23 DIAGNOSIS — N186 End stage renal disease: Secondary | ICD-10-CM | POA: Diagnosis not present

## 2020-04-23 DIAGNOSIS — Z992 Dependence on renal dialysis: Secondary | ICD-10-CM | POA: Diagnosis not present

## 2020-04-23 DIAGNOSIS — N2581 Secondary hyperparathyroidism of renal origin: Secondary | ICD-10-CM | POA: Diagnosis not present

## 2020-04-23 DIAGNOSIS — D631 Anemia in chronic kidney disease: Secondary | ICD-10-CM | POA: Diagnosis not present

## 2020-04-23 DIAGNOSIS — D509 Iron deficiency anemia, unspecified: Secondary | ICD-10-CM | POA: Diagnosis not present

## 2020-04-24 ENCOUNTER — Ambulatory Visit: Payer: Medicare Other | Admitting: Cardiology

## 2020-04-24 DIAGNOSIS — D631 Anemia in chronic kidney disease: Secondary | ICD-10-CM | POA: Diagnosis not present

## 2020-04-24 DIAGNOSIS — Z992 Dependence on renal dialysis: Secondary | ICD-10-CM | POA: Diagnosis not present

## 2020-04-24 DIAGNOSIS — D509 Iron deficiency anemia, unspecified: Secondary | ICD-10-CM | POA: Diagnosis not present

## 2020-04-24 DIAGNOSIS — N2581 Secondary hyperparathyroidism of renal origin: Secondary | ICD-10-CM | POA: Diagnosis not present

## 2020-04-24 DIAGNOSIS — N186 End stage renal disease: Secondary | ICD-10-CM | POA: Diagnosis not present

## 2020-04-25 DIAGNOSIS — Z992 Dependence on renal dialysis: Secondary | ICD-10-CM | POA: Diagnosis not present

## 2020-04-25 DIAGNOSIS — D631 Anemia in chronic kidney disease: Secondary | ICD-10-CM | POA: Diagnosis not present

## 2020-04-25 DIAGNOSIS — N186 End stage renal disease: Secondary | ICD-10-CM | POA: Diagnosis not present

## 2020-04-25 DIAGNOSIS — N2581 Secondary hyperparathyroidism of renal origin: Secondary | ICD-10-CM | POA: Diagnosis not present

## 2020-04-25 DIAGNOSIS — D509 Iron deficiency anemia, unspecified: Secondary | ICD-10-CM | POA: Diagnosis not present

## 2020-04-26 DIAGNOSIS — Z992 Dependence on renal dialysis: Secondary | ICD-10-CM | POA: Diagnosis not present

## 2020-04-26 DIAGNOSIS — N2581 Secondary hyperparathyroidism of renal origin: Secondary | ICD-10-CM | POA: Diagnosis not present

## 2020-04-26 DIAGNOSIS — N186 End stage renal disease: Secondary | ICD-10-CM | POA: Diagnosis not present

## 2020-04-26 DIAGNOSIS — D631 Anemia in chronic kidney disease: Secondary | ICD-10-CM | POA: Diagnosis not present

## 2020-04-26 DIAGNOSIS — D509 Iron deficiency anemia, unspecified: Secondary | ICD-10-CM | POA: Diagnosis not present

## 2020-04-27 DIAGNOSIS — I10 Essential (primary) hypertension: Secondary | ICD-10-CM | POA: Diagnosis not present

## 2020-04-27 DIAGNOSIS — D509 Iron deficiency anemia, unspecified: Secondary | ICD-10-CM | POA: Diagnosis not present

## 2020-04-27 DIAGNOSIS — D631 Anemia in chronic kidney disease: Secondary | ICD-10-CM | POA: Diagnosis not present

## 2020-04-27 DIAGNOSIS — M546 Pain in thoracic spine: Secondary | ICD-10-CM | POA: Diagnosis not present

## 2020-04-27 DIAGNOSIS — K219 Gastro-esophageal reflux disease without esophagitis: Secondary | ICD-10-CM | POA: Diagnosis not present

## 2020-04-27 DIAGNOSIS — N185 Chronic kidney disease, stage 5: Secondary | ICD-10-CM | POA: Diagnosis not present

## 2020-04-27 DIAGNOSIS — Z992 Dependence on renal dialysis: Secondary | ICD-10-CM | POA: Diagnosis not present

## 2020-04-27 DIAGNOSIS — N401 Enlarged prostate with lower urinary tract symptoms: Secondary | ICD-10-CM | POA: Diagnosis not present

## 2020-04-27 DIAGNOSIS — D508 Other iron deficiency anemias: Secondary | ICD-10-CM | POA: Diagnosis not present

## 2020-04-27 DIAGNOSIS — N186 End stage renal disease: Secondary | ICD-10-CM | POA: Diagnosis not present

## 2020-04-27 DIAGNOSIS — N2581 Secondary hyperparathyroidism of renal origin: Secondary | ICD-10-CM | POA: Diagnosis not present

## 2020-04-27 NOTE — Progress Notes (Addendum)
Cardiology Office Note  Date: 04/28/2020   ID: Rodney Gordon, DOB 27-Sep-1955, MRN [id number]  PCP:  Neale Burly, MD  Cardiologist:  Rozann Lesches, MD Electrophysiologist:  None   Chief Complaint: Follow-up aortic valve insufficiency.  History of Present Illness: Rodney Gordon is a 64 y.o. male with a history of aortic valve insufficiency (mild to moderate), CAD nonobstructive by cardiac catheterization 2004.  ESRD on PD, HTN, PVD.  Last encounter with Dr. Domenic Polite 02/23/2019 via telemedicine: He did not report any major changes in terms of cardiac symptoms over the prior year has stable NYHA class II dyspnea.  No recurrent chest pain or palpitations.  He was undergoing PD and followed up with nephrology.  Recent echo LVEF 60 to 65% with moderate aortic regurgitation.  Blood pressure was well controlled.  There were no changes made to current regimen.  Patient presents today for follow-up on his aortic valve insufficiency.  He denies any recent acute illnesses, hospitalizations, surgeries.  Stated he has some mild dyspnea on exertion which resolves within 5 minutes.  Denies any orthostatic symptoms, CVA or TIA-like symptoms, palpitations or arrhythmias, PND, orthopnea, claudication, DVT or PE-like symptoms, lower extremity edema.  Continuing on PD for end-stage renal disease.   Past Medical History:  Diagnosis Date  . Anemia of chronic disease   . Aortic insufficiency    Mild to moderate  . Coronary atherosclerosis of native coronary artery    Nonobstructive at catheterization 2004  . ESRD on hemodialysis ([place])    [name]  . Essential hypertension   . Peripheral vascular disease [facility])     Past Surgical History:  Procedure Laterality Date  . A/V FISTULAGRAM Right 09/05/2017   Procedure: A/V FISTULAGRAM;  Surgeon: Angelia Mould, MD;  Location: [place] [facility];  Service: Cardiovascular;  Laterality: Right;  . AV FISTULA PLACEMENT Right 12/17/2016   Procedure:  ARTERIOVENOUS (AV) FISTULA CREATION- RIGHT BRACHIOCEPHALIC;  Surgeon: Elam Dutch, MD;  Location: [place];  Service: Vascular;  Laterality: Right;  . CAPD INSERTION N/A 11/03/2017   Procedure: LAPAROSCOPIC INSERTION CONTINUOUS AMBULATORY PERITONEAL DIALYSIS  (CAPD) CATHETER;  Surgeon: Clovis Riley, MD;  Location: [place];  Service: General;  Laterality: N/A;  . CARDIAC CATHETERIZATION     at [facility]  . IR DIALY SHUNT INTRO NEEDLE/INTRACATH INITIAL W/IMG RIGHT Right 04/24/2017  . IR US GUIDE VASC ACCESS RIGHT  04/24/2017  . PERIPHERAL VASCULAR BALLOON ANGIOPLASTY Right 09/05/2017   Procedure: PERIPHERAL VASCULAR BALLOON ANGIOPLASTY;  Surgeon: Angelia Mould, MD;  Location: [place] [facility];  Service: Cardiovascular;  Laterality: Right;  . PROSTATE SURGERY    . TIBIA FRACTURE SURGERY     Left    Current Outpatient Medications  Medication Sig Dispense Refill  . acetaminophen (TYLENOL) 500 MG tablet Take 1,000 mg by mouth 2 (two) times daily as needed for mild pain.    Marland Kitchen acetaminophen (TYLENOL) 650 MG CR tablet Take 650 mg by mouth daily as needed for pain.    Marland Kitchen amLODipine (NORVASC) 10 MG tablet Take 1 tablet (10 mg total) by mouth daily. 30 tablet 6  . aspirin 81 MG tablet Take 81 mg by mouth daily.     Marland Kitchen atenolol-chlorthalidone (TENORETIC) 50-25 MG tablet Take 1 tablet by mouth daily.    . calcium acetate (PHOSLO) 667 MG capsule Take 667 mg by mouth 3 (three) times daily with meals.    . diphenhydramine-acetaminophen (TYLENOL PM) 25-500 MG TABS tablet Take 1  tablet by mouth 2 (two) times daily as needed (PAIN).    Marland Kitchen doxazosin (CARDURA) 8 MG tablet Take 8 mg by mouth daily.    . furosemide (LASIX) 80 MG tablet Take 80 mg by mouth 2 (two) times daily.    Marland Kitchen ibuprofen (ADVIL,MOTRIN) 200 MG tablet Take 200 mg by mouth every 6 (six) hours as needed (for apin.).    Marland Kitchen isosorbide mononitrate (IMDUR) 30 MG 24 hr tablet Take 30 mg by mouth daily.    Marland Kitchen lidocaine-prilocaine (EMLA) cream Apply 1  application topically as needed (prior to dialysis).     Marland Kitchen losartan (COZAAR) 50 MG tablet Take 50 mg by mouth daily.    . methocarbamol (ROBAXIN) 500 MG tablet Take 500 mg by mouth at bedtime.    . nitroGLYCERIN (NITROSTAT) 0.4 MG SL tablet Place 1 tablet (0.4 mg total) under the tongue as directed. (Patient taking differently: Place 0.4 mg under the tongue every 5 (five) minutes as needed for chest pain. ) 25 tablet 2  . oxyCODONE-acetaminophen (PERCOCET/ROXICET) 5-325 MG tablet Take 1 tablet by mouth every 6 (six) hours as needed for severe pain. 20 tablet 0  . potassium chloride SA (K-DUR) 20 MEQ tablet Take 20 mEq by mouth daily.    . sodium bicarbonate 650 MG tablet Take 1,300 mg by mouth 3 (three) times daily.     . tamsulosin (FLOMAX) 0.4 MG CAPS capsule Take 1 capsule by mouth daily.     No current facility-administered medications for this visit.   Allergies:  Patient has no known allergies.   Social History: The patient  reports that he quit smoking about 21 years ago. His smoking use included cigarettes. He started smoking about 38 years ago. He has a 25.00 pack-year smoking history. He has never used smokeless tobacco. He reports that he does not drink alcohol and does not use drugs.   Family History: The patient's family history includes Cancer in an other family member; Coronary artery disease in an other family member.   ROS:  Please see the history of present illness. Otherwise, complete review of systems is positive for none.  All other systems are reviewed and negative.   Physical Exam: VS:  BP 140/80   Pulse (!) 54   Ht 6\' 1"  (1.854 m)   Wt 167 lb 9.6 oz (76 kg)   SpO2 99%   BMI 22.11 kg/m , BMI Body mass index is 22.11 kg/m.  Wt Readings from Last 3 Encounters:  04/28/20 167 lb 9.6 oz (76 kg)  02/23/19 176 lb 1.6 oz (79.9 kg)  07/01/18 167 lb (75.8 kg)    General: Patient appears comfortable at rest. Neck: Supple, no elevated JVP or carotid bruits, no  thyromegaly. Lungs: Clear to auscultation, nonlabored breathing at rest. Cardiac: Regular rate and rhythm, no S3 or significant systolic murmur, no pericardial rub. Abdomen: Soft, nontender, PD catheter present. Extremities: No pitting edema, distal pulses 2+. Skin: Warm and dry. Musculoskeletal: No kyphosis. Neuropsychiatric: Alert and oriented x3, affect grossly appropriate.  ECG:  An ECG dated 04/28/2020 was personally reviewed today and demonstrated:  Sinus bradycardia rate of 54.  Nonspecific T abnormality.  Recent Labwork: No results found for requested labs within last 8760 hours.  No results found for: CHOL, TRIG, HDL, CHOLHDL, VLDL, LDLCALC, LDLDIRECT  Other Studies Reviewed Today:  Echocardiogram 03/14/2020 1. Left ventricular ejection fraction, by estimation, is 70 to 75%. The left ventricle has hyperdynamic function. The left ventricle has no regional wall motion  abnormalities. There is mild left ventricular hypertrophy. Left ventricular diastolic parameters are consistent with Grade I diastolic dysfunction (impaired relaxation). 2. Right ventricular systolic function is normal. The right ventricular size is normal. There is normal pulmonary artery systolic pressure. The estimated right ventricular systolic pressure is 96.2 mmHg. 3. The mitral valve is grossly normal. Trivial mitral valve regurgitation. 4. The aortic valve is tricuspid, mildly calcified with decreased left coronary cusp motion but no stenosis. Aortic valve regurgitation is moderate. 5. The inferior vena cava is normal in size with greater than 50% respiratory variability, suggesting right atrial pressure of 3 mmHg.   Echocardiogram 02/04/2019: 1. The left ventricle has normal systolic function with an ejection fraction of 60-65%. The cavity size was normal. Left ventricular diastolic Doppler parameters are consistent with impaired relaxation. Elevated left ventricular end-diastolic pressure  No evidence  of left ventricular regional wall motion abnormalities. 2. The right ventricle has low normal systolic function. The cavity was normal. There is mildly increased right ventricular wall thickness. 3. Left atrial size was mildly dilated. 4. The aortic valve is tricuspid. Mild thickening of the aortic valve. Aortic valve regurgitation is moderate by color flow Doppler. Moderate aortic annular calcification noted. 5. The mitral valve is grossly normal. Mitral valve regurgitation is moderate by color flow Doppler. 6. The tricuspid valve is grossly normal. 7. The aortic root is normal in size and structure.  Assessment and Plan:  1. Aortic valve insufficiency, etiology of cardiac valve disease unspecified   2. ESRD (end stage renal disease) ([place])   3. Essential hypertension   4. CAD in native artery    1. Aortic valve insufficiency, etiology of cardiac valve disease unspecified Recent repeat echocardiogram March 14, 2020 to reassess aortic insufficiency.  EF 70 to 75%, mild LVH, G1 DD, trivial MR moderate aortic valve regurgitation.  States he has some mild dyspnea on moderate exertion but quickly recovers in a few minutes.  I reviewed the results of the echocardiogram with patient.  He verbalizes understanding  2. ESRD on peritoneal dialysis  Patient states his dialysis been going well.  No recent issues with PD catheter.  Follows with nephrology.   3. Essential hypertension Blood pressure initially 174/80.  Recheck in right arm 140/80.  Patient states at PCP office yesterday blood pressure systolic was in the 229N.  Continue amlodipine 10 mg daily.  Continue atenolol chlorthalidone 50/25 mg p.o. daily.  Continue Lasix 80 mg p.o. daily.  Continue losartan 50 mg p.o. daily   4.  Nonobstructive CAD. Denies any anginal symptoms.  Does have some mild dyspnea on exertion. Continue aspirin 81 mg, Imdur 30 mg daily, nitroglycerin sublingual as needed for chest pain.   Medication  Adjustments/Labs and Tests Ordered: Current medicines are reviewed at length with the patient today.  Concerns regarding medicines are outlined above.   Disposition: Follow-up with Dr. Domenic Polite or APP 6 months  Signed, Levell July, NP 04/28/2020 1:17 PM    [facility] at [address], [place], [place] [address] Phone: [phone]; Fax: [phone]

## 2020-04-28 ENCOUNTER — Encounter: Payer: Self-pay | Admitting: Family Medicine

## 2020-04-28 ENCOUNTER — Other Ambulatory Visit: Payer: Self-pay

## 2020-04-28 ENCOUNTER — Ambulatory Visit (INDEPENDENT_AMBULATORY_CARE_PROVIDER_SITE_OTHER): Payer: Medicare Other | Admitting: Family Medicine

## 2020-04-28 VITALS — BP 140/80 | HR 54 | Ht 73.0 in | Wt 167.6 lb

## 2020-04-28 DIAGNOSIS — I351 Nonrheumatic aortic (valve) insufficiency: Secondary | ICD-10-CM

## 2020-04-28 DIAGNOSIS — Z992 Dependence on renal dialysis: Secondary | ICD-10-CM | POA: Diagnosis not present

## 2020-04-28 DIAGNOSIS — N2581 Secondary hyperparathyroidism of renal origin: Secondary | ICD-10-CM | POA: Diagnosis not present

## 2020-04-28 DIAGNOSIS — I10 Essential (primary) hypertension: Secondary | ICD-10-CM

## 2020-04-28 DIAGNOSIS — N186 End stage renal disease: Secondary | ICD-10-CM | POA: Diagnosis not present

## 2020-04-28 DIAGNOSIS — I2510 Atherosclerotic heart disease of native coronary artery without angina pectoris: Secondary | ICD-10-CM

## 2020-04-28 DIAGNOSIS — D631 Anemia in chronic kidney disease: Secondary | ICD-10-CM | POA: Diagnosis not present

## 2020-04-28 DIAGNOSIS — D509 Iron deficiency anemia, unspecified: Secondary | ICD-10-CM | POA: Diagnosis not present

## 2020-04-28 NOTE — Patient Instructions (Signed)
Medication Instructions:  Continue all current medications.   Labwork: none  Testing/Procedures: none  Follow-Up: 6 months   Any Other Special Instructions Will Be Listed Below (If Applicable).   If you need a refill on your cardiac medications before your next appointment, please call your pharmacy.  

## 2020-04-29 DIAGNOSIS — N2581 Secondary hyperparathyroidism of renal origin: Secondary | ICD-10-CM | POA: Diagnosis not present

## 2020-04-29 DIAGNOSIS — N186 End stage renal disease: Secondary | ICD-10-CM | POA: Diagnosis not present

## 2020-04-29 DIAGNOSIS — D509 Iron deficiency anemia, unspecified: Secondary | ICD-10-CM | POA: Diagnosis not present

## 2020-04-29 DIAGNOSIS — Z992 Dependence on renal dialysis: Secondary | ICD-10-CM | POA: Diagnosis not present

## 2020-04-29 DIAGNOSIS — D631 Anemia in chronic kidney disease: Secondary | ICD-10-CM | POA: Diagnosis not present

## 2020-04-30 DIAGNOSIS — N186 End stage renal disease: Secondary | ICD-10-CM | POA: Diagnosis not present

## 2020-04-30 DIAGNOSIS — N2581 Secondary hyperparathyroidism of renal origin: Secondary | ICD-10-CM | POA: Diagnosis not present

## 2020-04-30 DIAGNOSIS — D509 Iron deficiency anemia, unspecified: Secondary | ICD-10-CM | POA: Diagnosis not present

## 2020-04-30 DIAGNOSIS — Z992 Dependence on renal dialysis: Secondary | ICD-10-CM | POA: Diagnosis not present

## 2020-04-30 DIAGNOSIS — D631 Anemia in chronic kidney disease: Secondary | ICD-10-CM | POA: Diagnosis not present

## 2020-05-01 DIAGNOSIS — Z992 Dependence on renal dialysis: Secondary | ICD-10-CM | POA: Diagnosis not present

## 2020-05-01 DIAGNOSIS — N2581 Secondary hyperparathyroidism of renal origin: Secondary | ICD-10-CM | POA: Diagnosis not present

## 2020-05-01 DIAGNOSIS — D631 Anemia in chronic kidney disease: Secondary | ICD-10-CM | POA: Diagnosis not present

## 2020-05-01 DIAGNOSIS — N186 End stage renal disease: Secondary | ICD-10-CM | POA: Diagnosis not present

## 2020-05-01 DIAGNOSIS — D509 Iron deficiency anemia, unspecified: Secondary | ICD-10-CM | POA: Diagnosis not present

## 2020-05-02 DIAGNOSIS — D509 Iron deficiency anemia, unspecified: Secondary | ICD-10-CM | POA: Diagnosis not present

## 2020-05-02 DIAGNOSIS — N2581 Secondary hyperparathyroidism of renal origin: Secondary | ICD-10-CM | POA: Diagnosis not present

## 2020-05-02 DIAGNOSIS — Z992 Dependence on renal dialysis: Secondary | ICD-10-CM | POA: Diagnosis not present

## 2020-05-02 DIAGNOSIS — D631 Anemia in chronic kidney disease: Secondary | ICD-10-CM | POA: Diagnosis not present

## 2020-05-02 DIAGNOSIS — N186 End stage renal disease: Secondary | ICD-10-CM | POA: Diagnosis not present

## 2020-05-03 DIAGNOSIS — D509 Iron deficiency anemia, unspecified: Secondary | ICD-10-CM | POA: Diagnosis not present

## 2020-05-03 DIAGNOSIS — N186 End stage renal disease: Secondary | ICD-10-CM | POA: Diagnosis not present

## 2020-05-03 DIAGNOSIS — Z992 Dependence on renal dialysis: Secondary | ICD-10-CM | POA: Diagnosis not present

## 2020-05-03 DIAGNOSIS — D631 Anemia in chronic kidney disease: Secondary | ICD-10-CM | POA: Diagnosis not present

## 2020-05-03 DIAGNOSIS — N2581 Secondary hyperparathyroidism of renal origin: Secondary | ICD-10-CM | POA: Diagnosis not present

## 2020-05-04 DIAGNOSIS — D509 Iron deficiency anemia, unspecified: Secondary | ICD-10-CM | POA: Diagnosis not present

## 2020-05-04 DIAGNOSIS — N186 End stage renal disease: Secondary | ICD-10-CM | POA: Diagnosis not present

## 2020-05-04 DIAGNOSIS — N2581 Secondary hyperparathyroidism of renal origin: Secondary | ICD-10-CM | POA: Diagnosis not present

## 2020-05-04 DIAGNOSIS — Z992 Dependence on renal dialysis: Secondary | ICD-10-CM | POA: Diagnosis not present

## 2020-05-04 DIAGNOSIS — D631 Anemia in chronic kidney disease: Secondary | ICD-10-CM | POA: Diagnosis not present

## 2020-05-05 DIAGNOSIS — D509 Iron deficiency anemia, unspecified: Secondary | ICD-10-CM | POA: Diagnosis not present

## 2020-05-05 DIAGNOSIS — D631 Anemia in chronic kidney disease: Secondary | ICD-10-CM | POA: Diagnosis not present

## 2020-05-05 DIAGNOSIS — N2581 Secondary hyperparathyroidism of renal origin: Secondary | ICD-10-CM | POA: Diagnosis not present

## 2020-05-05 DIAGNOSIS — N186 End stage renal disease: Secondary | ICD-10-CM | POA: Diagnosis not present

## 2020-05-05 DIAGNOSIS — Z992 Dependence on renal dialysis: Secondary | ICD-10-CM | POA: Diagnosis not present

## 2020-05-06 DIAGNOSIS — D631 Anemia in chronic kidney disease: Secondary | ICD-10-CM | POA: Diagnosis not present

## 2020-05-06 DIAGNOSIS — N2581 Secondary hyperparathyroidism of renal origin: Secondary | ICD-10-CM | POA: Diagnosis not present

## 2020-05-06 DIAGNOSIS — N186 End stage renal disease: Secondary | ICD-10-CM | POA: Diagnosis not present

## 2020-05-06 DIAGNOSIS — D509 Iron deficiency anemia, unspecified: Secondary | ICD-10-CM | POA: Diagnosis not present

## 2020-05-06 DIAGNOSIS — Z992 Dependence on renal dialysis: Secondary | ICD-10-CM | POA: Diagnosis not present

## 2020-05-07 DIAGNOSIS — Z992 Dependence on renal dialysis: Secondary | ICD-10-CM | POA: Diagnosis not present

## 2020-05-07 DIAGNOSIS — N2581 Secondary hyperparathyroidism of renal origin: Secondary | ICD-10-CM | POA: Diagnosis not present

## 2020-05-07 DIAGNOSIS — D509 Iron deficiency anemia, unspecified: Secondary | ICD-10-CM | POA: Diagnosis not present

## 2020-05-07 DIAGNOSIS — D631 Anemia in chronic kidney disease: Secondary | ICD-10-CM | POA: Diagnosis not present

## 2020-05-07 DIAGNOSIS — N186 End stage renal disease: Secondary | ICD-10-CM | POA: Diagnosis not present

## 2020-05-08 DIAGNOSIS — Z992 Dependence on renal dialysis: Secondary | ICD-10-CM | POA: Diagnosis not present

## 2020-05-08 DIAGNOSIS — D509 Iron deficiency anemia, unspecified: Secondary | ICD-10-CM | POA: Diagnosis not present

## 2020-05-08 DIAGNOSIS — N186 End stage renal disease: Secondary | ICD-10-CM | POA: Diagnosis not present

## 2020-05-08 DIAGNOSIS — N2581 Secondary hyperparathyroidism of renal origin: Secondary | ICD-10-CM | POA: Diagnosis not present

## 2020-05-08 DIAGNOSIS — D631 Anemia in chronic kidney disease: Secondary | ICD-10-CM | POA: Diagnosis not present

## 2020-05-09 DIAGNOSIS — Z992 Dependence on renal dialysis: Secondary | ICD-10-CM | POA: Diagnosis not present

## 2020-05-09 DIAGNOSIS — N186 End stage renal disease: Secondary | ICD-10-CM | POA: Diagnosis not present

## 2020-05-09 DIAGNOSIS — D509 Iron deficiency anemia, unspecified: Secondary | ICD-10-CM | POA: Diagnosis not present

## 2020-05-09 DIAGNOSIS — N2581 Secondary hyperparathyroidism of renal origin: Secondary | ICD-10-CM | POA: Diagnosis not present

## 2020-05-09 DIAGNOSIS — D631 Anemia in chronic kidney disease: Secondary | ICD-10-CM | POA: Diagnosis not present

## 2020-05-10 DIAGNOSIS — D631 Anemia in chronic kidney disease: Secondary | ICD-10-CM | POA: Diagnosis not present

## 2020-05-10 DIAGNOSIS — D509 Iron deficiency anemia, unspecified: Secondary | ICD-10-CM | POA: Diagnosis not present

## 2020-05-10 DIAGNOSIS — Z992 Dependence on renal dialysis: Secondary | ICD-10-CM | POA: Diagnosis not present

## 2020-05-10 DIAGNOSIS — N2581 Secondary hyperparathyroidism of renal origin: Secondary | ICD-10-CM | POA: Diagnosis not present

## 2020-05-10 DIAGNOSIS — N186 End stage renal disease: Secondary | ICD-10-CM | POA: Diagnosis not present

## 2020-05-11 DIAGNOSIS — N2581 Secondary hyperparathyroidism of renal origin: Secondary | ICD-10-CM | POA: Diagnosis not present

## 2020-05-11 DIAGNOSIS — Z992 Dependence on renal dialysis: Secondary | ICD-10-CM | POA: Diagnosis not present

## 2020-05-11 DIAGNOSIS — N186 End stage renal disease: Secondary | ICD-10-CM | POA: Diagnosis not present

## 2020-05-11 DIAGNOSIS — D509 Iron deficiency anemia, unspecified: Secondary | ICD-10-CM | POA: Diagnosis not present

## 2020-05-11 DIAGNOSIS — D631 Anemia in chronic kidney disease: Secondary | ICD-10-CM | POA: Diagnosis not present

## 2020-05-12 DIAGNOSIS — N2581 Secondary hyperparathyroidism of renal origin: Secondary | ICD-10-CM | POA: Diagnosis not present

## 2020-05-12 DIAGNOSIS — D509 Iron deficiency anemia, unspecified: Secondary | ICD-10-CM | POA: Diagnosis not present

## 2020-05-12 DIAGNOSIS — N186 End stage renal disease: Secondary | ICD-10-CM | POA: Diagnosis not present

## 2020-05-12 DIAGNOSIS — D631 Anemia in chronic kidney disease: Secondary | ICD-10-CM | POA: Diagnosis not present

## 2020-05-12 DIAGNOSIS — Z992 Dependence on renal dialysis: Secondary | ICD-10-CM | POA: Diagnosis not present

## 2020-05-13 DIAGNOSIS — D509 Iron deficiency anemia, unspecified: Secondary | ICD-10-CM | POA: Diagnosis not present

## 2020-05-13 DIAGNOSIS — D631 Anemia in chronic kidney disease: Secondary | ICD-10-CM | POA: Diagnosis not present

## 2020-05-13 DIAGNOSIS — N186 End stage renal disease: Secondary | ICD-10-CM | POA: Diagnosis not present

## 2020-05-13 DIAGNOSIS — Z992 Dependence on renal dialysis: Secondary | ICD-10-CM | POA: Diagnosis not present

## 2020-05-13 DIAGNOSIS — N2581 Secondary hyperparathyroidism of renal origin: Secondary | ICD-10-CM | POA: Diagnosis not present

## 2020-05-14 DIAGNOSIS — N2581 Secondary hyperparathyroidism of renal origin: Secondary | ICD-10-CM | POA: Diagnosis not present

## 2020-05-14 DIAGNOSIS — D509 Iron deficiency anemia, unspecified: Secondary | ICD-10-CM | POA: Diagnosis not present

## 2020-05-14 DIAGNOSIS — D631 Anemia in chronic kidney disease: Secondary | ICD-10-CM | POA: Diagnosis not present

## 2020-05-14 DIAGNOSIS — N186 End stage renal disease: Secondary | ICD-10-CM | POA: Diagnosis not present

## 2020-05-14 DIAGNOSIS — Z992 Dependence on renal dialysis: Secondary | ICD-10-CM | POA: Diagnosis not present

## 2020-05-15 DIAGNOSIS — N2581 Secondary hyperparathyroidism of renal origin: Secondary | ICD-10-CM | POA: Diagnosis not present

## 2020-05-15 DIAGNOSIS — Z992 Dependence on renal dialysis: Secondary | ICD-10-CM | POA: Diagnosis not present

## 2020-05-15 DIAGNOSIS — N186 End stage renal disease: Secondary | ICD-10-CM | POA: Diagnosis not present

## 2020-05-15 DIAGNOSIS — D509 Iron deficiency anemia, unspecified: Secondary | ICD-10-CM | POA: Diagnosis not present

## 2020-05-15 DIAGNOSIS — D631 Anemia in chronic kidney disease: Secondary | ICD-10-CM | POA: Diagnosis not present

## 2020-05-16 DIAGNOSIS — N2581 Secondary hyperparathyroidism of renal origin: Secondary | ICD-10-CM | POA: Diagnosis not present

## 2020-05-16 DIAGNOSIS — Z992 Dependence on renal dialysis: Secondary | ICD-10-CM | POA: Diagnosis not present

## 2020-05-16 DIAGNOSIS — D631 Anemia in chronic kidney disease: Secondary | ICD-10-CM | POA: Diagnosis not present

## 2020-05-16 DIAGNOSIS — D509 Iron deficiency anemia, unspecified: Secondary | ICD-10-CM | POA: Diagnosis not present

## 2020-05-16 DIAGNOSIS — N186 End stage renal disease: Secondary | ICD-10-CM | POA: Diagnosis not present

## 2020-05-17 DIAGNOSIS — N186 End stage renal disease: Secondary | ICD-10-CM | POA: Diagnosis not present

## 2020-05-17 DIAGNOSIS — N2581 Secondary hyperparathyroidism of renal origin: Secondary | ICD-10-CM | POA: Diagnosis not present

## 2020-05-17 DIAGNOSIS — D509 Iron deficiency anemia, unspecified: Secondary | ICD-10-CM | POA: Diagnosis not present

## 2020-05-17 DIAGNOSIS — D631 Anemia in chronic kidney disease: Secondary | ICD-10-CM | POA: Diagnosis not present

## 2020-05-17 DIAGNOSIS — Z992 Dependence on renal dialysis: Secondary | ICD-10-CM | POA: Diagnosis not present

## 2020-05-18 DIAGNOSIS — D509 Iron deficiency anemia, unspecified: Secondary | ICD-10-CM | POA: Diagnosis not present

## 2020-05-18 DIAGNOSIS — Z992 Dependence on renal dialysis: Secondary | ICD-10-CM | POA: Diagnosis not present

## 2020-05-18 DIAGNOSIS — N2581 Secondary hyperparathyroidism of renal origin: Secondary | ICD-10-CM | POA: Diagnosis not present

## 2020-05-18 DIAGNOSIS — N186 End stage renal disease: Secondary | ICD-10-CM | POA: Diagnosis not present

## 2020-05-18 DIAGNOSIS — D631 Anemia in chronic kidney disease: Secondary | ICD-10-CM | POA: Diagnosis not present

## 2020-05-19 DIAGNOSIS — D631 Anemia in chronic kidney disease: Secondary | ICD-10-CM | POA: Diagnosis not present

## 2020-05-19 DIAGNOSIS — N2581 Secondary hyperparathyroidism of renal origin: Secondary | ICD-10-CM | POA: Diagnosis not present

## 2020-05-19 DIAGNOSIS — Z992 Dependence on renal dialysis: Secondary | ICD-10-CM | POA: Diagnosis not present

## 2020-05-19 DIAGNOSIS — N186 End stage renal disease: Secondary | ICD-10-CM | POA: Diagnosis not present

## 2020-05-19 DIAGNOSIS — D509 Iron deficiency anemia, unspecified: Secondary | ICD-10-CM | POA: Diagnosis not present

## 2020-05-20 DIAGNOSIS — N186 End stage renal disease: Secondary | ICD-10-CM | POA: Diagnosis not present

## 2020-05-20 DIAGNOSIS — N2581 Secondary hyperparathyroidism of renal origin: Secondary | ICD-10-CM | POA: Diagnosis not present

## 2020-05-20 DIAGNOSIS — D509 Iron deficiency anemia, unspecified: Secondary | ICD-10-CM | POA: Diagnosis not present

## 2020-05-20 DIAGNOSIS — Z992 Dependence on renal dialysis: Secondary | ICD-10-CM | POA: Diagnosis not present

## 2020-05-20 DIAGNOSIS — D631 Anemia in chronic kidney disease: Secondary | ICD-10-CM | POA: Diagnosis not present

## 2020-05-21 DIAGNOSIS — N186 End stage renal disease: Secondary | ICD-10-CM | POA: Diagnosis not present

## 2020-05-21 DIAGNOSIS — D509 Iron deficiency anemia, unspecified: Secondary | ICD-10-CM | POA: Diagnosis not present

## 2020-05-21 DIAGNOSIS — Z992 Dependence on renal dialysis: Secondary | ICD-10-CM | POA: Diagnosis not present

## 2020-05-21 DIAGNOSIS — N2581 Secondary hyperparathyroidism of renal origin: Secondary | ICD-10-CM | POA: Diagnosis not present

## 2020-05-21 DIAGNOSIS — D631 Anemia in chronic kidney disease: Secondary | ICD-10-CM | POA: Diagnosis not present

## 2020-05-22 DIAGNOSIS — Z23 Encounter for immunization: Secondary | ICD-10-CM | POA: Diagnosis not present

## 2020-05-22 DIAGNOSIS — N186 End stage renal disease: Secondary | ICD-10-CM | POA: Diagnosis not present

## 2020-05-22 DIAGNOSIS — N2581 Secondary hyperparathyroidism of renal origin: Secondary | ICD-10-CM | POA: Diagnosis not present

## 2020-05-22 DIAGNOSIS — D509 Iron deficiency anemia, unspecified: Secondary | ICD-10-CM | POA: Diagnosis not present

## 2020-05-22 DIAGNOSIS — Z992 Dependence on renal dialysis: Secondary | ICD-10-CM | POA: Diagnosis not present

## 2020-05-22 DIAGNOSIS — D631 Anemia in chronic kidney disease: Secondary | ICD-10-CM | POA: Diagnosis not present

## 2020-05-23 DIAGNOSIS — Z992 Dependence on renal dialysis: Secondary | ICD-10-CM | POA: Diagnosis not present

## 2020-05-23 DIAGNOSIS — Z23 Encounter for immunization: Secondary | ICD-10-CM | POA: Diagnosis not present

## 2020-05-23 DIAGNOSIS — N186 End stage renal disease: Secondary | ICD-10-CM | POA: Diagnosis not present

## 2020-05-23 DIAGNOSIS — D509 Iron deficiency anemia, unspecified: Secondary | ICD-10-CM | POA: Diagnosis not present

## 2020-05-23 DIAGNOSIS — D631 Anemia in chronic kidney disease: Secondary | ICD-10-CM | POA: Diagnosis not present

## 2020-05-23 DIAGNOSIS — N2581 Secondary hyperparathyroidism of renal origin: Secondary | ICD-10-CM | POA: Diagnosis not present

## 2020-05-24 DIAGNOSIS — N2581 Secondary hyperparathyroidism of renal origin: Secondary | ICD-10-CM | POA: Diagnosis not present

## 2020-05-24 DIAGNOSIS — Z992 Dependence on renal dialysis: Secondary | ICD-10-CM | POA: Diagnosis not present

## 2020-05-24 DIAGNOSIS — Z23 Encounter for immunization: Secondary | ICD-10-CM | POA: Diagnosis not present

## 2020-05-24 DIAGNOSIS — N186 End stage renal disease: Secondary | ICD-10-CM | POA: Diagnosis not present

## 2020-05-24 DIAGNOSIS — D509 Iron deficiency anemia, unspecified: Secondary | ICD-10-CM | POA: Diagnosis not present

## 2020-05-24 DIAGNOSIS — D631 Anemia in chronic kidney disease: Secondary | ICD-10-CM | POA: Diagnosis not present

## 2020-05-25 DIAGNOSIS — D631 Anemia in chronic kidney disease: Secondary | ICD-10-CM | POA: Diagnosis not present

## 2020-05-25 DIAGNOSIS — Z992 Dependence on renal dialysis: Secondary | ICD-10-CM | POA: Diagnosis not present

## 2020-05-25 DIAGNOSIS — D509 Iron deficiency anemia, unspecified: Secondary | ICD-10-CM | POA: Diagnosis not present

## 2020-05-25 DIAGNOSIS — Z23 Encounter for immunization: Secondary | ICD-10-CM | POA: Diagnosis not present

## 2020-05-25 DIAGNOSIS — N2581 Secondary hyperparathyroidism of renal origin: Secondary | ICD-10-CM | POA: Diagnosis not present

## 2020-05-25 DIAGNOSIS — N186 End stage renal disease: Secondary | ICD-10-CM | POA: Diagnosis not present

## 2020-05-26 DIAGNOSIS — N2581 Secondary hyperparathyroidism of renal origin: Secondary | ICD-10-CM | POA: Diagnosis not present

## 2020-05-26 DIAGNOSIS — Z23 Encounter for immunization: Secondary | ICD-10-CM | POA: Diagnosis not present

## 2020-05-26 DIAGNOSIS — N186 End stage renal disease: Secondary | ICD-10-CM | POA: Diagnosis not present

## 2020-05-26 DIAGNOSIS — D631 Anemia in chronic kidney disease: Secondary | ICD-10-CM | POA: Diagnosis not present

## 2020-05-26 DIAGNOSIS — Z992 Dependence on renal dialysis: Secondary | ICD-10-CM | POA: Diagnosis not present

## 2020-05-26 DIAGNOSIS — D509 Iron deficiency anemia, unspecified: Secondary | ICD-10-CM | POA: Diagnosis not present

## 2020-05-27 DIAGNOSIS — D631 Anemia in chronic kidney disease: Secondary | ICD-10-CM | POA: Diagnosis not present

## 2020-05-27 DIAGNOSIS — N2581 Secondary hyperparathyroidism of renal origin: Secondary | ICD-10-CM | POA: Diagnosis not present

## 2020-05-27 DIAGNOSIS — Z992 Dependence on renal dialysis: Secondary | ICD-10-CM | POA: Diagnosis not present

## 2020-05-27 DIAGNOSIS — N186 End stage renal disease: Secondary | ICD-10-CM | POA: Diagnosis not present

## 2020-05-27 DIAGNOSIS — Z23 Encounter for immunization: Secondary | ICD-10-CM | POA: Diagnosis not present

## 2020-05-27 DIAGNOSIS — D509 Iron deficiency anemia, unspecified: Secondary | ICD-10-CM | POA: Diagnosis not present

## 2020-05-28 DIAGNOSIS — D631 Anemia in chronic kidney disease: Secondary | ICD-10-CM | POA: Diagnosis not present

## 2020-05-28 DIAGNOSIS — Z992 Dependence on renal dialysis: Secondary | ICD-10-CM | POA: Diagnosis not present

## 2020-05-28 DIAGNOSIS — N186 End stage renal disease: Secondary | ICD-10-CM | POA: Diagnosis not present

## 2020-05-28 DIAGNOSIS — D509 Iron deficiency anemia, unspecified: Secondary | ICD-10-CM | POA: Diagnosis not present

## 2020-05-28 DIAGNOSIS — Z23 Encounter for immunization: Secondary | ICD-10-CM | POA: Diagnosis not present

## 2020-05-28 DIAGNOSIS — N2581 Secondary hyperparathyroidism of renal origin: Secondary | ICD-10-CM | POA: Diagnosis not present

## 2020-05-29 DIAGNOSIS — Z992 Dependence on renal dialysis: Secondary | ICD-10-CM | POA: Diagnosis not present

## 2020-05-29 DIAGNOSIS — D509 Iron deficiency anemia, unspecified: Secondary | ICD-10-CM | POA: Diagnosis not present

## 2020-05-29 DIAGNOSIS — N2581 Secondary hyperparathyroidism of renal origin: Secondary | ICD-10-CM | POA: Diagnosis not present

## 2020-05-29 DIAGNOSIS — N186 End stage renal disease: Secondary | ICD-10-CM | POA: Diagnosis not present

## 2020-05-29 DIAGNOSIS — D631 Anemia in chronic kidney disease: Secondary | ICD-10-CM | POA: Diagnosis not present

## 2020-05-29 DIAGNOSIS — Z23 Encounter for immunization: Secondary | ICD-10-CM | POA: Diagnosis not present

## 2020-05-30 DIAGNOSIS — D509 Iron deficiency anemia, unspecified: Secondary | ICD-10-CM | POA: Diagnosis not present

## 2020-05-30 DIAGNOSIS — D631 Anemia in chronic kidney disease: Secondary | ICD-10-CM | POA: Diagnosis not present

## 2020-05-30 DIAGNOSIS — Z992 Dependence on renal dialysis: Secondary | ICD-10-CM | POA: Diagnosis not present

## 2020-05-30 DIAGNOSIS — Z23 Encounter for immunization: Secondary | ICD-10-CM | POA: Diagnosis not present

## 2020-05-30 DIAGNOSIS — N186 End stage renal disease: Secondary | ICD-10-CM | POA: Diagnosis not present

## 2020-05-30 DIAGNOSIS — N2581 Secondary hyperparathyroidism of renal origin: Secondary | ICD-10-CM | POA: Diagnosis not present

## 2020-05-31 DIAGNOSIS — Z992 Dependence on renal dialysis: Secondary | ICD-10-CM | POA: Diagnosis not present

## 2020-05-31 DIAGNOSIS — N2581 Secondary hyperparathyroidism of renal origin: Secondary | ICD-10-CM | POA: Diagnosis not present

## 2020-05-31 DIAGNOSIS — D631 Anemia in chronic kidney disease: Secondary | ICD-10-CM | POA: Diagnosis not present

## 2020-05-31 DIAGNOSIS — D509 Iron deficiency anemia, unspecified: Secondary | ICD-10-CM | POA: Diagnosis not present

## 2020-05-31 DIAGNOSIS — N186 End stage renal disease: Secondary | ICD-10-CM | POA: Diagnosis not present

## 2020-05-31 DIAGNOSIS — Z23 Encounter for immunization: Secondary | ICD-10-CM | POA: Diagnosis not present

## 2020-06-01 DIAGNOSIS — N2581 Secondary hyperparathyroidism of renal origin: Secondary | ICD-10-CM | POA: Diagnosis not present

## 2020-06-01 DIAGNOSIS — D631 Anemia in chronic kidney disease: Secondary | ICD-10-CM | POA: Diagnosis not present

## 2020-06-01 DIAGNOSIS — D509 Iron deficiency anemia, unspecified: Secondary | ICD-10-CM | POA: Diagnosis not present

## 2020-06-01 DIAGNOSIS — N186 End stage renal disease: Secondary | ICD-10-CM | POA: Diagnosis not present

## 2020-06-01 DIAGNOSIS — Z992 Dependence on renal dialysis: Secondary | ICD-10-CM | POA: Diagnosis not present

## 2020-06-01 DIAGNOSIS — Z23 Encounter for immunization: Secondary | ICD-10-CM | POA: Diagnosis not present

## 2020-06-02 DIAGNOSIS — N186 End stage renal disease: Secondary | ICD-10-CM | POA: Diagnosis not present

## 2020-06-02 DIAGNOSIS — D509 Iron deficiency anemia, unspecified: Secondary | ICD-10-CM | POA: Diagnosis not present

## 2020-06-02 DIAGNOSIS — Z23 Encounter for immunization: Secondary | ICD-10-CM | POA: Diagnosis not present

## 2020-06-02 DIAGNOSIS — D631 Anemia in chronic kidney disease: Secondary | ICD-10-CM | POA: Diagnosis not present

## 2020-06-02 DIAGNOSIS — Z992 Dependence on renal dialysis: Secondary | ICD-10-CM | POA: Diagnosis not present

## 2020-06-02 DIAGNOSIS — N2581 Secondary hyperparathyroidism of renal origin: Secondary | ICD-10-CM | POA: Diagnosis not present

## 2020-06-03 DIAGNOSIS — N2581 Secondary hyperparathyroidism of renal origin: Secondary | ICD-10-CM | POA: Diagnosis not present

## 2020-06-03 DIAGNOSIS — Z992 Dependence on renal dialysis: Secondary | ICD-10-CM | POA: Diagnosis not present

## 2020-06-03 DIAGNOSIS — Z23 Encounter for immunization: Secondary | ICD-10-CM | POA: Diagnosis not present

## 2020-06-03 DIAGNOSIS — D509 Iron deficiency anemia, unspecified: Secondary | ICD-10-CM | POA: Diagnosis not present

## 2020-06-03 DIAGNOSIS — D631 Anemia in chronic kidney disease: Secondary | ICD-10-CM | POA: Diagnosis not present

## 2020-06-03 DIAGNOSIS — N186 End stage renal disease: Secondary | ICD-10-CM | POA: Diagnosis not present

## 2020-06-04 DIAGNOSIS — D631 Anemia in chronic kidney disease: Secondary | ICD-10-CM | POA: Diagnosis not present

## 2020-06-04 DIAGNOSIS — D509 Iron deficiency anemia, unspecified: Secondary | ICD-10-CM | POA: Diagnosis not present

## 2020-06-04 DIAGNOSIS — Z992 Dependence on renal dialysis: Secondary | ICD-10-CM | POA: Diagnosis not present

## 2020-06-04 DIAGNOSIS — N186 End stage renal disease: Secondary | ICD-10-CM | POA: Diagnosis not present

## 2020-06-04 DIAGNOSIS — N2581 Secondary hyperparathyroidism of renal origin: Secondary | ICD-10-CM | POA: Diagnosis not present

## 2020-06-04 DIAGNOSIS — Z23 Encounter for immunization: Secondary | ICD-10-CM | POA: Diagnosis not present

## 2020-06-05 DIAGNOSIS — N186 End stage renal disease: Secondary | ICD-10-CM | POA: Diagnosis not present

## 2020-06-05 DIAGNOSIS — Z992 Dependence on renal dialysis: Secondary | ICD-10-CM | POA: Diagnosis not present

## 2020-06-05 DIAGNOSIS — Z23 Encounter for immunization: Secondary | ICD-10-CM | POA: Diagnosis not present

## 2020-06-05 DIAGNOSIS — N2581 Secondary hyperparathyroidism of renal origin: Secondary | ICD-10-CM | POA: Diagnosis not present

## 2020-06-05 DIAGNOSIS — D509 Iron deficiency anemia, unspecified: Secondary | ICD-10-CM | POA: Diagnosis not present

## 2020-06-05 DIAGNOSIS — D631 Anemia in chronic kidney disease: Secondary | ICD-10-CM | POA: Diagnosis not present

## 2020-06-06 DIAGNOSIS — D509 Iron deficiency anemia, unspecified: Secondary | ICD-10-CM | POA: Diagnosis not present

## 2020-06-06 DIAGNOSIS — Z992 Dependence on renal dialysis: Secondary | ICD-10-CM | POA: Diagnosis not present

## 2020-06-06 DIAGNOSIS — N186 End stage renal disease: Secondary | ICD-10-CM | POA: Diagnosis not present

## 2020-06-06 DIAGNOSIS — D631 Anemia in chronic kidney disease: Secondary | ICD-10-CM | POA: Diagnosis not present

## 2020-06-06 DIAGNOSIS — Z23 Encounter for immunization: Secondary | ICD-10-CM | POA: Diagnosis not present

## 2020-06-06 DIAGNOSIS — N2581 Secondary hyperparathyroidism of renal origin: Secondary | ICD-10-CM | POA: Diagnosis not present

## 2020-06-07 DIAGNOSIS — Z992 Dependence on renal dialysis: Secondary | ICD-10-CM | POA: Diagnosis not present

## 2020-06-07 DIAGNOSIS — Z23 Encounter for immunization: Secondary | ICD-10-CM | POA: Diagnosis not present

## 2020-06-07 DIAGNOSIS — N2581 Secondary hyperparathyroidism of renal origin: Secondary | ICD-10-CM | POA: Diagnosis not present

## 2020-06-07 DIAGNOSIS — N186 End stage renal disease: Secondary | ICD-10-CM | POA: Diagnosis not present

## 2020-06-07 DIAGNOSIS — D509 Iron deficiency anemia, unspecified: Secondary | ICD-10-CM | POA: Diagnosis not present

## 2020-06-07 DIAGNOSIS — D631 Anemia in chronic kidney disease: Secondary | ICD-10-CM | POA: Diagnosis not present

## 2020-06-08 DIAGNOSIS — N2581 Secondary hyperparathyroidism of renal origin: Secondary | ICD-10-CM | POA: Diagnosis not present

## 2020-06-08 DIAGNOSIS — Z992 Dependence on renal dialysis: Secondary | ICD-10-CM | POA: Diagnosis not present

## 2020-06-08 DIAGNOSIS — N186 End stage renal disease: Secondary | ICD-10-CM | POA: Diagnosis not present

## 2020-06-08 DIAGNOSIS — D509 Iron deficiency anemia, unspecified: Secondary | ICD-10-CM | POA: Diagnosis not present

## 2020-06-08 DIAGNOSIS — D631 Anemia in chronic kidney disease: Secondary | ICD-10-CM | POA: Diagnosis not present

## 2020-06-08 DIAGNOSIS — Z23 Encounter for immunization: Secondary | ICD-10-CM | POA: Diagnosis not present

## 2020-06-09 DIAGNOSIS — D509 Iron deficiency anemia, unspecified: Secondary | ICD-10-CM | POA: Diagnosis not present

## 2020-06-09 DIAGNOSIS — Z23 Encounter for immunization: Secondary | ICD-10-CM | POA: Diagnosis not present

## 2020-06-09 DIAGNOSIS — D631 Anemia in chronic kidney disease: Secondary | ICD-10-CM | POA: Diagnosis not present

## 2020-06-09 DIAGNOSIS — Z992 Dependence on renal dialysis: Secondary | ICD-10-CM | POA: Diagnosis not present

## 2020-06-09 DIAGNOSIS — N186 End stage renal disease: Secondary | ICD-10-CM | POA: Diagnosis not present

## 2020-06-09 DIAGNOSIS — N2581 Secondary hyperparathyroidism of renal origin: Secondary | ICD-10-CM | POA: Diagnosis not present

## 2020-06-10 DIAGNOSIS — D509 Iron deficiency anemia, unspecified: Secondary | ICD-10-CM | POA: Diagnosis not present

## 2020-06-10 DIAGNOSIS — D631 Anemia in chronic kidney disease: Secondary | ICD-10-CM | POA: Diagnosis not present

## 2020-06-10 DIAGNOSIS — N186 End stage renal disease: Secondary | ICD-10-CM | POA: Diagnosis not present

## 2020-06-10 DIAGNOSIS — Z992 Dependence on renal dialysis: Secondary | ICD-10-CM | POA: Diagnosis not present

## 2020-06-10 DIAGNOSIS — Z23 Encounter for immunization: Secondary | ICD-10-CM | POA: Diagnosis not present

## 2020-06-10 DIAGNOSIS — N2581 Secondary hyperparathyroidism of renal origin: Secondary | ICD-10-CM | POA: Diagnosis not present

## 2020-06-11 DIAGNOSIS — N2581 Secondary hyperparathyroidism of renal origin: Secondary | ICD-10-CM | POA: Diagnosis not present

## 2020-06-11 DIAGNOSIS — D631 Anemia in chronic kidney disease: Secondary | ICD-10-CM | POA: Diagnosis not present

## 2020-06-11 DIAGNOSIS — D509 Iron deficiency anemia, unspecified: Secondary | ICD-10-CM | POA: Diagnosis not present

## 2020-06-11 DIAGNOSIS — Z992 Dependence on renal dialysis: Secondary | ICD-10-CM | POA: Diagnosis not present

## 2020-06-11 DIAGNOSIS — Z23 Encounter for immunization: Secondary | ICD-10-CM | POA: Diagnosis not present

## 2020-06-11 DIAGNOSIS — N186 End stage renal disease: Secondary | ICD-10-CM | POA: Diagnosis not present

## 2020-06-12 DIAGNOSIS — Z992 Dependence on renal dialysis: Secondary | ICD-10-CM | POA: Diagnosis not present

## 2020-06-12 DIAGNOSIS — D509 Iron deficiency anemia, unspecified: Secondary | ICD-10-CM | POA: Diagnosis not present

## 2020-06-12 DIAGNOSIS — D631 Anemia in chronic kidney disease: Secondary | ICD-10-CM | POA: Diagnosis not present

## 2020-06-12 DIAGNOSIS — Z23 Encounter for immunization: Secondary | ICD-10-CM | POA: Diagnosis not present

## 2020-06-12 DIAGNOSIS — N2581 Secondary hyperparathyroidism of renal origin: Secondary | ICD-10-CM | POA: Diagnosis not present

## 2020-06-12 DIAGNOSIS — N186 End stage renal disease: Secondary | ICD-10-CM | POA: Diagnosis not present

## 2020-06-13 DIAGNOSIS — N186 End stage renal disease: Secondary | ICD-10-CM | POA: Diagnosis not present

## 2020-06-13 DIAGNOSIS — D631 Anemia in chronic kidney disease: Secondary | ICD-10-CM | POA: Diagnosis not present

## 2020-06-13 DIAGNOSIS — N2581 Secondary hyperparathyroidism of renal origin: Secondary | ICD-10-CM | POA: Diagnosis not present

## 2020-06-13 DIAGNOSIS — Z992 Dependence on renal dialysis: Secondary | ICD-10-CM | POA: Diagnosis not present

## 2020-06-13 DIAGNOSIS — Z23 Encounter for immunization: Secondary | ICD-10-CM | POA: Diagnosis not present

## 2020-06-13 DIAGNOSIS — D509 Iron deficiency anemia, unspecified: Secondary | ICD-10-CM | POA: Diagnosis not present

## 2020-06-14 DIAGNOSIS — N186 End stage renal disease: Secondary | ICD-10-CM | POA: Diagnosis not present

## 2020-06-14 DIAGNOSIS — D509 Iron deficiency anemia, unspecified: Secondary | ICD-10-CM | POA: Diagnosis not present

## 2020-06-14 DIAGNOSIS — N2581 Secondary hyperparathyroidism of renal origin: Secondary | ICD-10-CM | POA: Diagnosis not present

## 2020-06-14 DIAGNOSIS — D631 Anemia in chronic kidney disease: Secondary | ICD-10-CM | POA: Diagnosis not present

## 2020-06-14 DIAGNOSIS — Z23 Encounter for immunization: Secondary | ICD-10-CM | POA: Diagnosis not present

## 2020-06-14 DIAGNOSIS — Z992 Dependence on renal dialysis: Secondary | ICD-10-CM | POA: Diagnosis not present

## 2020-06-15 DIAGNOSIS — Z23 Encounter for immunization: Secondary | ICD-10-CM | POA: Diagnosis not present

## 2020-06-15 DIAGNOSIS — D509 Iron deficiency anemia, unspecified: Secondary | ICD-10-CM | POA: Diagnosis not present

## 2020-06-15 DIAGNOSIS — N2581 Secondary hyperparathyroidism of renal origin: Secondary | ICD-10-CM | POA: Diagnosis not present

## 2020-06-15 DIAGNOSIS — Z992 Dependence on renal dialysis: Secondary | ICD-10-CM | POA: Diagnosis not present

## 2020-06-15 DIAGNOSIS — N186 End stage renal disease: Secondary | ICD-10-CM | POA: Diagnosis not present

## 2020-06-15 DIAGNOSIS — D631 Anemia in chronic kidney disease: Secondary | ICD-10-CM | POA: Diagnosis not present

## 2020-06-16 DIAGNOSIS — D509 Iron deficiency anemia, unspecified: Secondary | ICD-10-CM | POA: Diagnosis not present

## 2020-06-16 DIAGNOSIS — Z992 Dependence on renal dialysis: Secondary | ICD-10-CM | POA: Diagnosis not present

## 2020-06-16 DIAGNOSIS — N186 End stage renal disease: Secondary | ICD-10-CM | POA: Diagnosis not present

## 2020-06-16 DIAGNOSIS — N2581 Secondary hyperparathyroidism of renal origin: Secondary | ICD-10-CM | POA: Diagnosis not present

## 2020-06-16 DIAGNOSIS — D631 Anemia in chronic kidney disease: Secondary | ICD-10-CM | POA: Diagnosis not present

## 2020-06-16 DIAGNOSIS — Z23 Encounter for immunization: Secondary | ICD-10-CM | POA: Diagnosis not present

## 2020-06-17 DIAGNOSIS — Z992 Dependence on renal dialysis: Secondary | ICD-10-CM | POA: Diagnosis not present

## 2020-06-17 DIAGNOSIS — Z23 Encounter for immunization: Secondary | ICD-10-CM | POA: Diagnosis not present

## 2020-06-17 DIAGNOSIS — N2581 Secondary hyperparathyroidism of renal origin: Secondary | ICD-10-CM | POA: Diagnosis not present

## 2020-06-17 DIAGNOSIS — D631 Anemia in chronic kidney disease: Secondary | ICD-10-CM | POA: Diagnosis not present

## 2020-06-17 DIAGNOSIS — D509 Iron deficiency anemia, unspecified: Secondary | ICD-10-CM | POA: Diagnosis not present

## 2020-06-17 DIAGNOSIS — N186 End stage renal disease: Secondary | ICD-10-CM | POA: Diagnosis not present

## 2020-06-18 DIAGNOSIS — Z23 Encounter for immunization: Secondary | ICD-10-CM | POA: Diagnosis not present

## 2020-06-18 DIAGNOSIS — N2581 Secondary hyperparathyroidism of renal origin: Secondary | ICD-10-CM | POA: Diagnosis not present

## 2020-06-18 DIAGNOSIS — Z992 Dependence on renal dialysis: Secondary | ICD-10-CM | POA: Diagnosis not present

## 2020-06-18 DIAGNOSIS — N186 End stage renal disease: Secondary | ICD-10-CM | POA: Diagnosis not present

## 2020-06-18 DIAGNOSIS — D631 Anemia in chronic kidney disease: Secondary | ICD-10-CM | POA: Diagnosis not present

## 2020-06-18 DIAGNOSIS — D509 Iron deficiency anemia, unspecified: Secondary | ICD-10-CM | POA: Diagnosis not present

## 2020-06-19 DIAGNOSIS — N2581 Secondary hyperparathyroidism of renal origin: Secondary | ICD-10-CM | POA: Diagnosis not present

## 2020-06-19 DIAGNOSIS — D509 Iron deficiency anemia, unspecified: Secondary | ICD-10-CM | POA: Diagnosis not present

## 2020-06-19 DIAGNOSIS — Z992 Dependence on renal dialysis: Secondary | ICD-10-CM | POA: Diagnosis not present

## 2020-06-19 DIAGNOSIS — N186 End stage renal disease: Secondary | ICD-10-CM | POA: Diagnosis not present

## 2020-06-19 DIAGNOSIS — Z23 Encounter for immunization: Secondary | ICD-10-CM | POA: Diagnosis not present

## 2020-06-19 DIAGNOSIS — D631 Anemia in chronic kidney disease: Secondary | ICD-10-CM | POA: Diagnosis not present

## 2020-06-20 DIAGNOSIS — N186 End stage renal disease: Secondary | ICD-10-CM | POA: Diagnosis not present

## 2020-06-20 DIAGNOSIS — Z23 Encounter for immunization: Secondary | ICD-10-CM | POA: Diagnosis not present

## 2020-06-20 DIAGNOSIS — N2581 Secondary hyperparathyroidism of renal origin: Secondary | ICD-10-CM | POA: Diagnosis not present

## 2020-06-20 DIAGNOSIS — D509 Iron deficiency anemia, unspecified: Secondary | ICD-10-CM | POA: Diagnosis not present

## 2020-06-20 DIAGNOSIS — D631 Anemia in chronic kidney disease: Secondary | ICD-10-CM | POA: Diagnosis not present

## 2020-06-20 DIAGNOSIS — Z992 Dependence on renal dialysis: Secondary | ICD-10-CM | POA: Diagnosis not present

## 2020-06-21 DIAGNOSIS — N186 End stage renal disease: Secondary | ICD-10-CM | POA: Diagnosis not present

## 2020-06-21 DIAGNOSIS — Z992 Dependence on renal dialysis: Secondary | ICD-10-CM | POA: Diagnosis not present

## 2020-06-21 DIAGNOSIS — D509 Iron deficiency anemia, unspecified: Secondary | ICD-10-CM | POA: Diagnosis not present

## 2020-06-21 DIAGNOSIS — D631 Anemia in chronic kidney disease: Secondary | ICD-10-CM | POA: Diagnosis not present

## 2020-06-22 DIAGNOSIS — Z992 Dependence on renal dialysis: Secondary | ICD-10-CM | POA: Diagnosis not present

## 2020-06-22 DIAGNOSIS — N186 End stage renal disease: Secondary | ICD-10-CM | POA: Diagnosis not present

## 2020-06-22 DIAGNOSIS — D509 Iron deficiency anemia, unspecified: Secondary | ICD-10-CM | POA: Diagnosis not present

## 2020-06-22 DIAGNOSIS — D631 Anemia in chronic kidney disease: Secondary | ICD-10-CM | POA: Diagnosis not present

## 2020-06-23 DIAGNOSIS — D509 Iron deficiency anemia, unspecified: Secondary | ICD-10-CM | POA: Diagnosis not present

## 2020-06-23 DIAGNOSIS — Z992 Dependence on renal dialysis: Secondary | ICD-10-CM | POA: Diagnosis not present

## 2020-06-23 DIAGNOSIS — N186 End stage renal disease: Secondary | ICD-10-CM | POA: Diagnosis not present

## 2020-06-23 DIAGNOSIS — D631 Anemia in chronic kidney disease: Secondary | ICD-10-CM | POA: Diagnosis not present

## 2020-06-24 DIAGNOSIS — D631 Anemia in chronic kidney disease: Secondary | ICD-10-CM | POA: Diagnosis not present

## 2020-06-24 DIAGNOSIS — Z992 Dependence on renal dialysis: Secondary | ICD-10-CM | POA: Diagnosis not present

## 2020-06-24 DIAGNOSIS — N186 End stage renal disease: Secondary | ICD-10-CM | POA: Diagnosis not present

## 2020-06-24 DIAGNOSIS — D509 Iron deficiency anemia, unspecified: Secondary | ICD-10-CM | POA: Diagnosis not present

## 2020-06-25 DIAGNOSIS — D631 Anemia in chronic kidney disease: Secondary | ICD-10-CM | POA: Diagnosis not present

## 2020-06-25 DIAGNOSIS — N186 End stage renal disease: Secondary | ICD-10-CM | POA: Diagnosis not present

## 2020-06-25 DIAGNOSIS — D509 Iron deficiency anemia, unspecified: Secondary | ICD-10-CM | POA: Diagnosis not present

## 2020-06-25 DIAGNOSIS — Z992 Dependence on renal dialysis: Secondary | ICD-10-CM | POA: Diagnosis not present

## 2020-06-26 DIAGNOSIS — D631 Anemia in chronic kidney disease: Secondary | ICD-10-CM | POA: Diagnosis not present

## 2020-06-26 DIAGNOSIS — N186 End stage renal disease: Secondary | ICD-10-CM | POA: Diagnosis not present

## 2020-06-26 DIAGNOSIS — Z992 Dependence on renal dialysis: Secondary | ICD-10-CM | POA: Diagnosis not present

## 2020-06-26 DIAGNOSIS — D509 Iron deficiency anemia, unspecified: Secondary | ICD-10-CM | POA: Diagnosis not present

## 2020-06-27 DIAGNOSIS — D509 Iron deficiency anemia, unspecified: Secondary | ICD-10-CM | POA: Diagnosis not present

## 2020-06-27 DIAGNOSIS — Z992 Dependence on renal dialysis: Secondary | ICD-10-CM | POA: Diagnosis not present

## 2020-06-27 DIAGNOSIS — D631 Anemia in chronic kidney disease: Secondary | ICD-10-CM | POA: Diagnosis not present

## 2020-06-27 DIAGNOSIS — N186 End stage renal disease: Secondary | ICD-10-CM | POA: Diagnosis not present

## 2020-06-28 DIAGNOSIS — D509 Iron deficiency anemia, unspecified: Secondary | ICD-10-CM | POA: Diagnosis not present

## 2020-06-28 DIAGNOSIS — N186 End stage renal disease: Secondary | ICD-10-CM | POA: Diagnosis not present

## 2020-06-28 DIAGNOSIS — D631 Anemia in chronic kidney disease: Secondary | ICD-10-CM | POA: Diagnosis not present

## 2020-06-28 DIAGNOSIS — Z992 Dependence on renal dialysis: Secondary | ICD-10-CM | POA: Diagnosis not present

## 2020-06-29 DIAGNOSIS — N186 End stage renal disease: Secondary | ICD-10-CM | POA: Diagnosis not present

## 2020-06-29 DIAGNOSIS — D509 Iron deficiency anemia, unspecified: Secondary | ICD-10-CM | POA: Diagnosis not present

## 2020-06-29 DIAGNOSIS — Z992 Dependence on renal dialysis: Secondary | ICD-10-CM | POA: Diagnosis not present

## 2020-06-29 DIAGNOSIS — D631 Anemia in chronic kidney disease: Secondary | ICD-10-CM | POA: Diagnosis not present

## 2020-06-30 DIAGNOSIS — D631 Anemia in chronic kidney disease: Secondary | ICD-10-CM | POA: Diagnosis not present

## 2020-06-30 DIAGNOSIS — D509 Iron deficiency anemia, unspecified: Secondary | ICD-10-CM | POA: Diagnosis not present

## 2020-06-30 DIAGNOSIS — Z992 Dependence on renal dialysis: Secondary | ICD-10-CM | POA: Diagnosis not present

## 2020-06-30 DIAGNOSIS — N186 End stage renal disease: Secondary | ICD-10-CM | POA: Diagnosis not present

## 2020-07-01 DIAGNOSIS — Z992 Dependence on renal dialysis: Secondary | ICD-10-CM | POA: Diagnosis not present

## 2020-07-01 DIAGNOSIS — D631 Anemia in chronic kidney disease: Secondary | ICD-10-CM | POA: Diagnosis not present

## 2020-07-01 DIAGNOSIS — D509 Iron deficiency anemia, unspecified: Secondary | ICD-10-CM | POA: Diagnosis not present

## 2020-07-01 DIAGNOSIS — N186 End stage renal disease: Secondary | ICD-10-CM | POA: Diagnosis not present

## 2020-07-02 DIAGNOSIS — D631 Anemia in chronic kidney disease: Secondary | ICD-10-CM | POA: Diagnosis not present

## 2020-07-02 DIAGNOSIS — D509 Iron deficiency anemia, unspecified: Secondary | ICD-10-CM | POA: Diagnosis not present

## 2020-07-02 DIAGNOSIS — N186 End stage renal disease: Secondary | ICD-10-CM | POA: Diagnosis not present

## 2020-07-02 DIAGNOSIS — Z992 Dependence on renal dialysis: Secondary | ICD-10-CM | POA: Diagnosis not present

## 2020-07-03 DIAGNOSIS — N186 End stage renal disease: Secondary | ICD-10-CM | POA: Diagnosis not present

## 2020-07-03 DIAGNOSIS — D509 Iron deficiency anemia, unspecified: Secondary | ICD-10-CM | POA: Diagnosis not present

## 2020-07-03 DIAGNOSIS — D631 Anemia in chronic kidney disease: Secondary | ICD-10-CM | POA: Diagnosis not present

## 2020-07-03 DIAGNOSIS — Z992 Dependence on renal dialysis: Secondary | ICD-10-CM | POA: Diagnosis not present

## 2020-07-04 DIAGNOSIS — D509 Iron deficiency anemia, unspecified: Secondary | ICD-10-CM | POA: Diagnosis not present

## 2020-07-04 DIAGNOSIS — N186 End stage renal disease: Secondary | ICD-10-CM | POA: Diagnosis not present

## 2020-07-04 DIAGNOSIS — D631 Anemia in chronic kidney disease: Secondary | ICD-10-CM | POA: Diagnosis not present

## 2020-07-04 DIAGNOSIS — Z992 Dependence on renal dialysis: Secondary | ICD-10-CM | POA: Diagnosis not present

## 2020-07-05 DIAGNOSIS — D509 Iron deficiency anemia, unspecified: Secondary | ICD-10-CM | POA: Diagnosis not present

## 2020-07-05 DIAGNOSIS — N186 End stage renal disease: Secondary | ICD-10-CM | POA: Diagnosis not present

## 2020-07-05 DIAGNOSIS — D631 Anemia in chronic kidney disease: Secondary | ICD-10-CM | POA: Diagnosis not present

## 2020-07-05 DIAGNOSIS — Z992 Dependence on renal dialysis: Secondary | ICD-10-CM | POA: Diagnosis not present

## 2020-07-06 DIAGNOSIS — N186 End stage renal disease: Secondary | ICD-10-CM | POA: Diagnosis not present

## 2020-07-06 DIAGNOSIS — D631 Anemia in chronic kidney disease: Secondary | ICD-10-CM | POA: Diagnosis not present

## 2020-07-06 DIAGNOSIS — Z992 Dependence on renal dialysis: Secondary | ICD-10-CM | POA: Diagnosis not present

## 2020-07-06 DIAGNOSIS — D509 Iron deficiency anemia, unspecified: Secondary | ICD-10-CM | POA: Diagnosis not present

## 2020-07-07 DIAGNOSIS — D509 Iron deficiency anemia, unspecified: Secondary | ICD-10-CM | POA: Diagnosis not present

## 2020-07-07 DIAGNOSIS — D631 Anemia in chronic kidney disease: Secondary | ICD-10-CM | POA: Diagnosis not present

## 2020-07-07 DIAGNOSIS — Z992 Dependence on renal dialysis: Secondary | ICD-10-CM | POA: Diagnosis not present

## 2020-07-07 DIAGNOSIS — N186 End stage renal disease: Secondary | ICD-10-CM | POA: Diagnosis not present

## 2020-07-08 DIAGNOSIS — D631 Anemia in chronic kidney disease: Secondary | ICD-10-CM | POA: Diagnosis not present

## 2020-07-08 DIAGNOSIS — D509 Iron deficiency anemia, unspecified: Secondary | ICD-10-CM | POA: Diagnosis not present

## 2020-07-08 DIAGNOSIS — N186 End stage renal disease: Secondary | ICD-10-CM | POA: Diagnosis not present

## 2020-07-08 DIAGNOSIS — Z992 Dependence on renal dialysis: Secondary | ICD-10-CM | POA: Diagnosis not present

## 2020-07-09 DIAGNOSIS — D509 Iron deficiency anemia, unspecified: Secondary | ICD-10-CM | POA: Diagnosis not present

## 2020-07-09 DIAGNOSIS — Z992 Dependence on renal dialysis: Secondary | ICD-10-CM | POA: Diagnosis not present

## 2020-07-09 DIAGNOSIS — D631 Anemia in chronic kidney disease: Secondary | ICD-10-CM | POA: Diagnosis not present

## 2020-07-09 DIAGNOSIS — N186 End stage renal disease: Secondary | ICD-10-CM | POA: Diagnosis not present

## 2020-07-10 DIAGNOSIS — D509 Iron deficiency anemia, unspecified: Secondary | ICD-10-CM | POA: Diagnosis not present

## 2020-07-10 DIAGNOSIS — Z992 Dependence on renal dialysis: Secondary | ICD-10-CM | POA: Diagnosis not present

## 2020-07-10 DIAGNOSIS — N186 End stage renal disease: Secondary | ICD-10-CM | POA: Diagnosis not present

## 2020-07-10 DIAGNOSIS — D631 Anemia in chronic kidney disease: Secondary | ICD-10-CM | POA: Diagnosis not present

## 2020-07-11 DIAGNOSIS — N186 End stage renal disease: Secondary | ICD-10-CM | POA: Diagnosis not present

## 2020-07-11 DIAGNOSIS — D509 Iron deficiency anemia, unspecified: Secondary | ICD-10-CM | POA: Diagnosis not present

## 2020-07-11 DIAGNOSIS — Z992 Dependence on renal dialysis: Secondary | ICD-10-CM | POA: Diagnosis not present

## 2020-07-11 DIAGNOSIS — D631 Anemia in chronic kidney disease: Secondary | ICD-10-CM | POA: Diagnosis not present

## 2020-07-12 DIAGNOSIS — D631 Anemia in chronic kidney disease: Secondary | ICD-10-CM | POA: Diagnosis not present

## 2020-07-12 DIAGNOSIS — D509 Iron deficiency anemia, unspecified: Secondary | ICD-10-CM | POA: Diagnosis not present

## 2020-07-12 DIAGNOSIS — N186 End stage renal disease: Secondary | ICD-10-CM | POA: Diagnosis not present

## 2020-07-12 DIAGNOSIS — Z992 Dependence on renal dialysis: Secondary | ICD-10-CM | POA: Diagnosis not present

## 2020-07-13 DIAGNOSIS — D631 Anemia in chronic kidney disease: Secondary | ICD-10-CM | POA: Diagnosis not present

## 2020-07-13 DIAGNOSIS — D509 Iron deficiency anemia, unspecified: Secondary | ICD-10-CM | POA: Diagnosis not present

## 2020-07-13 DIAGNOSIS — Z23 Encounter for immunization: Secondary | ICD-10-CM | POA: Diagnosis not present

## 2020-07-13 DIAGNOSIS — N186 End stage renal disease: Secondary | ICD-10-CM | POA: Diagnosis not present

## 2020-07-13 DIAGNOSIS — Z992 Dependence on renal dialysis: Secondary | ICD-10-CM | POA: Diagnosis not present

## 2020-07-14 DIAGNOSIS — D631 Anemia in chronic kidney disease: Secondary | ICD-10-CM | POA: Diagnosis not present

## 2020-07-14 DIAGNOSIS — Z992 Dependence on renal dialysis: Secondary | ICD-10-CM | POA: Diagnosis not present

## 2020-07-14 DIAGNOSIS — D509 Iron deficiency anemia, unspecified: Secondary | ICD-10-CM | POA: Diagnosis not present

## 2020-07-14 DIAGNOSIS — N186 End stage renal disease: Secondary | ICD-10-CM | POA: Diagnosis not present

## 2020-07-15 DIAGNOSIS — D509 Iron deficiency anemia, unspecified: Secondary | ICD-10-CM | POA: Diagnosis not present

## 2020-07-15 DIAGNOSIS — N186 End stage renal disease: Secondary | ICD-10-CM | POA: Diagnosis not present

## 2020-07-15 DIAGNOSIS — Z992 Dependence on renal dialysis: Secondary | ICD-10-CM | POA: Diagnosis not present

## 2020-07-15 DIAGNOSIS — D631 Anemia in chronic kidney disease: Secondary | ICD-10-CM | POA: Diagnosis not present

## 2020-07-16 DIAGNOSIS — D509 Iron deficiency anemia, unspecified: Secondary | ICD-10-CM | POA: Diagnosis not present

## 2020-07-16 DIAGNOSIS — Z992 Dependence on renal dialysis: Secondary | ICD-10-CM | POA: Diagnosis not present

## 2020-07-16 DIAGNOSIS — N186 End stage renal disease: Secondary | ICD-10-CM | POA: Diagnosis not present

## 2020-07-16 DIAGNOSIS — D631 Anemia in chronic kidney disease: Secondary | ICD-10-CM | POA: Diagnosis not present

## 2020-07-17 DIAGNOSIS — Z992 Dependence on renal dialysis: Secondary | ICD-10-CM | POA: Diagnosis not present

## 2020-07-17 DIAGNOSIS — D509 Iron deficiency anemia, unspecified: Secondary | ICD-10-CM | POA: Diagnosis not present

## 2020-07-17 DIAGNOSIS — D631 Anemia in chronic kidney disease: Secondary | ICD-10-CM | POA: Diagnosis not present

## 2020-07-17 DIAGNOSIS — N186 End stage renal disease: Secondary | ICD-10-CM | POA: Diagnosis not present

## 2020-07-18 DIAGNOSIS — N186 End stage renal disease: Secondary | ICD-10-CM | POA: Diagnosis not present

## 2020-07-18 DIAGNOSIS — D631 Anemia in chronic kidney disease: Secondary | ICD-10-CM | POA: Diagnosis not present

## 2020-07-18 DIAGNOSIS — Z992 Dependence on renal dialysis: Secondary | ICD-10-CM | POA: Diagnosis not present

## 2020-07-18 DIAGNOSIS — D509 Iron deficiency anemia, unspecified: Secondary | ICD-10-CM | POA: Diagnosis not present

## 2020-07-19 DIAGNOSIS — D631 Anemia in chronic kidney disease: Secondary | ICD-10-CM | POA: Diagnosis not present

## 2020-07-19 DIAGNOSIS — N186 End stage renal disease: Secondary | ICD-10-CM | POA: Diagnosis not present

## 2020-07-19 DIAGNOSIS — Z992 Dependence on renal dialysis: Secondary | ICD-10-CM | POA: Diagnosis not present

## 2020-07-19 DIAGNOSIS — D509 Iron deficiency anemia, unspecified: Secondary | ICD-10-CM | POA: Diagnosis not present

## 2020-07-20 DIAGNOSIS — N186 End stage renal disease: Secondary | ICD-10-CM | POA: Diagnosis not present

## 2020-07-20 DIAGNOSIS — D631 Anemia in chronic kidney disease: Secondary | ICD-10-CM | POA: Diagnosis not present

## 2020-07-20 DIAGNOSIS — Z992 Dependence on renal dialysis: Secondary | ICD-10-CM | POA: Diagnosis not present

## 2020-07-20 DIAGNOSIS — D509 Iron deficiency anemia, unspecified: Secondary | ICD-10-CM | POA: Diagnosis not present

## 2020-07-21 DIAGNOSIS — D631 Anemia in chronic kidney disease: Secondary | ICD-10-CM | POA: Diagnosis not present

## 2020-07-21 DIAGNOSIS — Z992 Dependence on renal dialysis: Secondary | ICD-10-CM | POA: Diagnosis not present

## 2020-07-21 DIAGNOSIS — N186 End stage renal disease: Secondary | ICD-10-CM | POA: Diagnosis not present

## 2020-07-21 DIAGNOSIS — D509 Iron deficiency anemia, unspecified: Secondary | ICD-10-CM | POA: Diagnosis not present

## 2020-07-22 DIAGNOSIS — D509 Iron deficiency anemia, unspecified: Secondary | ICD-10-CM | POA: Diagnosis not present

## 2020-07-22 DIAGNOSIS — N186 End stage renal disease: Secondary | ICD-10-CM | POA: Diagnosis not present

## 2020-07-22 DIAGNOSIS — D631 Anemia in chronic kidney disease: Secondary | ICD-10-CM | POA: Diagnosis not present

## 2020-07-22 DIAGNOSIS — Z992 Dependence on renal dialysis: Secondary | ICD-10-CM | POA: Diagnosis not present

## 2020-07-22 DIAGNOSIS — N2581 Secondary hyperparathyroidism of renal origin: Secondary | ICD-10-CM | POA: Diagnosis not present

## 2020-07-23 DIAGNOSIS — N186 End stage renal disease: Secondary | ICD-10-CM | POA: Diagnosis not present

## 2020-07-23 DIAGNOSIS — N2581 Secondary hyperparathyroidism of renal origin: Secondary | ICD-10-CM | POA: Diagnosis not present

## 2020-07-23 DIAGNOSIS — Z992 Dependence on renal dialysis: Secondary | ICD-10-CM | POA: Diagnosis not present

## 2020-07-23 DIAGNOSIS — D509 Iron deficiency anemia, unspecified: Secondary | ICD-10-CM | POA: Diagnosis not present

## 2020-07-23 DIAGNOSIS — D631 Anemia in chronic kidney disease: Secondary | ICD-10-CM | POA: Diagnosis not present

## 2020-07-24 DIAGNOSIS — N2581 Secondary hyperparathyroidism of renal origin: Secondary | ICD-10-CM | POA: Diagnosis not present

## 2020-07-24 DIAGNOSIS — D631 Anemia in chronic kidney disease: Secondary | ICD-10-CM | POA: Diagnosis not present

## 2020-07-24 DIAGNOSIS — D509 Iron deficiency anemia, unspecified: Secondary | ICD-10-CM | POA: Diagnosis not present

## 2020-07-24 DIAGNOSIS — N186 End stage renal disease: Secondary | ICD-10-CM | POA: Diagnosis not present

## 2020-07-24 DIAGNOSIS — Z992 Dependence on renal dialysis: Secondary | ICD-10-CM | POA: Diagnosis not present

## 2020-07-25 DIAGNOSIS — Z992 Dependence on renal dialysis: Secondary | ICD-10-CM | POA: Diagnosis not present

## 2020-07-25 DIAGNOSIS — D509 Iron deficiency anemia, unspecified: Secondary | ICD-10-CM | POA: Diagnosis not present

## 2020-07-25 DIAGNOSIS — N2581 Secondary hyperparathyroidism of renal origin: Secondary | ICD-10-CM | POA: Diagnosis not present

## 2020-07-25 DIAGNOSIS — D631 Anemia in chronic kidney disease: Secondary | ICD-10-CM | POA: Diagnosis not present

## 2020-07-25 DIAGNOSIS — N186 End stage renal disease: Secondary | ICD-10-CM | POA: Diagnosis not present

## 2020-07-26 DIAGNOSIS — Z992 Dependence on renal dialysis: Secondary | ICD-10-CM | POA: Diagnosis not present

## 2020-07-26 DIAGNOSIS — N186 End stage renal disease: Secondary | ICD-10-CM | POA: Diagnosis not present

## 2020-07-26 DIAGNOSIS — D631 Anemia in chronic kidney disease: Secondary | ICD-10-CM | POA: Diagnosis not present

## 2020-07-26 DIAGNOSIS — N2581 Secondary hyperparathyroidism of renal origin: Secondary | ICD-10-CM | POA: Diagnosis not present

## 2020-07-26 DIAGNOSIS — D509 Iron deficiency anemia, unspecified: Secondary | ICD-10-CM | POA: Diagnosis not present

## 2020-07-27 DIAGNOSIS — N186 End stage renal disease: Secondary | ICD-10-CM | POA: Diagnosis not present

## 2020-07-27 DIAGNOSIS — Z992 Dependence on renal dialysis: Secondary | ICD-10-CM | POA: Diagnosis not present

## 2020-07-27 DIAGNOSIS — D631 Anemia in chronic kidney disease: Secondary | ICD-10-CM | POA: Diagnosis not present

## 2020-07-27 DIAGNOSIS — N2581 Secondary hyperparathyroidism of renal origin: Secondary | ICD-10-CM | POA: Diagnosis not present

## 2020-07-27 DIAGNOSIS — D509 Iron deficiency anemia, unspecified: Secondary | ICD-10-CM | POA: Diagnosis not present

## 2020-07-28 DIAGNOSIS — N186 End stage renal disease: Secondary | ICD-10-CM | POA: Diagnosis not present

## 2020-07-28 DIAGNOSIS — N185 Chronic kidney disease, stage 5: Secondary | ICD-10-CM | POA: Diagnosis not present

## 2020-07-28 DIAGNOSIS — D508 Other iron deficiency anemias: Secondary | ICD-10-CM | POA: Diagnosis not present

## 2020-07-28 DIAGNOSIS — Z992 Dependence on renal dialysis: Secondary | ICD-10-CM | POA: Diagnosis not present

## 2020-07-28 DIAGNOSIS — N2581 Secondary hyperparathyroidism of renal origin: Secondary | ICD-10-CM | POA: Diagnosis not present

## 2020-07-28 DIAGNOSIS — K219 Gastro-esophageal reflux disease without esophagitis: Secondary | ICD-10-CM | POA: Diagnosis not present

## 2020-07-28 DIAGNOSIS — N401 Enlarged prostate with lower urinary tract symptoms: Secondary | ICD-10-CM | POA: Diagnosis not present

## 2020-07-28 DIAGNOSIS — D509 Iron deficiency anemia, unspecified: Secondary | ICD-10-CM | POA: Diagnosis not present

## 2020-07-28 DIAGNOSIS — I10 Essential (primary) hypertension: Secondary | ICD-10-CM | POA: Diagnosis not present

## 2020-07-28 DIAGNOSIS — D631 Anemia in chronic kidney disease: Secondary | ICD-10-CM | POA: Diagnosis not present

## 2020-07-28 DIAGNOSIS — M546 Pain in thoracic spine: Secondary | ICD-10-CM | POA: Diagnosis not present

## 2020-07-29 DIAGNOSIS — D631 Anemia in chronic kidney disease: Secondary | ICD-10-CM | POA: Diagnosis not present

## 2020-07-29 DIAGNOSIS — D509 Iron deficiency anemia, unspecified: Secondary | ICD-10-CM | POA: Diagnosis not present

## 2020-07-29 DIAGNOSIS — N186 End stage renal disease: Secondary | ICD-10-CM | POA: Diagnosis not present

## 2020-07-29 DIAGNOSIS — Z992 Dependence on renal dialysis: Secondary | ICD-10-CM | POA: Diagnosis not present

## 2020-07-29 DIAGNOSIS — N2581 Secondary hyperparathyroidism of renal origin: Secondary | ICD-10-CM | POA: Diagnosis not present

## 2020-07-30 DIAGNOSIS — D509 Iron deficiency anemia, unspecified: Secondary | ICD-10-CM | POA: Diagnosis not present

## 2020-07-30 DIAGNOSIS — N2581 Secondary hyperparathyroidism of renal origin: Secondary | ICD-10-CM | POA: Diagnosis not present

## 2020-07-30 DIAGNOSIS — D631 Anemia in chronic kidney disease: Secondary | ICD-10-CM | POA: Diagnosis not present

## 2020-07-30 DIAGNOSIS — Z992 Dependence on renal dialysis: Secondary | ICD-10-CM | POA: Diagnosis not present

## 2020-07-30 DIAGNOSIS — N186 End stage renal disease: Secondary | ICD-10-CM | POA: Diagnosis not present

## 2020-07-31 DIAGNOSIS — N186 End stage renal disease: Secondary | ICD-10-CM | POA: Diagnosis not present

## 2020-07-31 DIAGNOSIS — Z992 Dependence on renal dialysis: Secondary | ICD-10-CM | POA: Diagnosis not present

## 2020-07-31 DIAGNOSIS — N2581 Secondary hyperparathyroidism of renal origin: Secondary | ICD-10-CM | POA: Diagnosis not present

## 2020-07-31 DIAGNOSIS — D509 Iron deficiency anemia, unspecified: Secondary | ICD-10-CM | POA: Diagnosis not present

## 2020-07-31 DIAGNOSIS — D631 Anemia in chronic kidney disease: Secondary | ICD-10-CM | POA: Diagnosis not present

## 2020-08-01 DIAGNOSIS — N2581 Secondary hyperparathyroidism of renal origin: Secondary | ICD-10-CM | POA: Diagnosis not present

## 2020-08-01 DIAGNOSIS — Z992 Dependence on renal dialysis: Secondary | ICD-10-CM | POA: Diagnosis not present

## 2020-08-01 DIAGNOSIS — D509 Iron deficiency anemia, unspecified: Secondary | ICD-10-CM | POA: Diagnosis not present

## 2020-08-01 DIAGNOSIS — N186 End stage renal disease: Secondary | ICD-10-CM | POA: Diagnosis not present

## 2020-08-01 DIAGNOSIS — D631 Anemia in chronic kidney disease: Secondary | ICD-10-CM | POA: Diagnosis not present

## 2020-08-02 DIAGNOSIS — N2581 Secondary hyperparathyroidism of renal origin: Secondary | ICD-10-CM | POA: Diagnosis not present

## 2020-08-02 DIAGNOSIS — Z992 Dependence on renal dialysis: Secondary | ICD-10-CM | POA: Diagnosis not present

## 2020-08-02 DIAGNOSIS — D631 Anemia in chronic kidney disease: Secondary | ICD-10-CM | POA: Diagnosis not present

## 2020-08-02 DIAGNOSIS — D509 Iron deficiency anemia, unspecified: Secondary | ICD-10-CM | POA: Diagnosis not present

## 2020-08-02 DIAGNOSIS — N186 End stage renal disease: Secondary | ICD-10-CM | POA: Diagnosis not present

## 2020-08-03 DIAGNOSIS — Z992 Dependence on renal dialysis: Secondary | ICD-10-CM | POA: Diagnosis not present

## 2020-08-03 DIAGNOSIS — N2581 Secondary hyperparathyroidism of renal origin: Secondary | ICD-10-CM | POA: Diagnosis not present

## 2020-08-03 DIAGNOSIS — D509 Iron deficiency anemia, unspecified: Secondary | ICD-10-CM | POA: Diagnosis not present

## 2020-08-03 DIAGNOSIS — D631 Anemia in chronic kidney disease: Secondary | ICD-10-CM | POA: Diagnosis not present

## 2020-08-03 DIAGNOSIS — N186 End stage renal disease: Secondary | ICD-10-CM | POA: Diagnosis not present

## 2020-08-04 DIAGNOSIS — Z992 Dependence on renal dialysis: Secondary | ICD-10-CM | POA: Diagnosis not present

## 2020-08-04 DIAGNOSIS — N186 End stage renal disease: Secondary | ICD-10-CM | POA: Diagnosis not present

## 2020-08-04 DIAGNOSIS — D509 Iron deficiency anemia, unspecified: Secondary | ICD-10-CM | POA: Diagnosis not present

## 2020-08-04 DIAGNOSIS — N2581 Secondary hyperparathyroidism of renal origin: Secondary | ICD-10-CM | POA: Diagnosis not present

## 2020-08-04 DIAGNOSIS — D631 Anemia in chronic kidney disease: Secondary | ICD-10-CM | POA: Diagnosis not present

## 2020-08-05 DIAGNOSIS — Z992 Dependence on renal dialysis: Secondary | ICD-10-CM | POA: Diagnosis not present

## 2020-08-05 DIAGNOSIS — N186 End stage renal disease: Secondary | ICD-10-CM | POA: Diagnosis not present

## 2020-08-05 DIAGNOSIS — N2581 Secondary hyperparathyroidism of renal origin: Secondary | ICD-10-CM | POA: Diagnosis not present

## 2020-08-05 DIAGNOSIS — D631 Anemia in chronic kidney disease: Secondary | ICD-10-CM | POA: Diagnosis not present

## 2020-08-05 DIAGNOSIS — D509 Iron deficiency anemia, unspecified: Secondary | ICD-10-CM | POA: Diagnosis not present

## 2020-08-06 DIAGNOSIS — D631 Anemia in chronic kidney disease: Secondary | ICD-10-CM | POA: Diagnosis not present

## 2020-08-06 DIAGNOSIS — N186 End stage renal disease: Secondary | ICD-10-CM | POA: Diagnosis not present

## 2020-08-06 DIAGNOSIS — D509 Iron deficiency anemia, unspecified: Secondary | ICD-10-CM | POA: Diagnosis not present

## 2020-08-06 DIAGNOSIS — N2581 Secondary hyperparathyroidism of renal origin: Secondary | ICD-10-CM | POA: Diagnosis not present

## 2020-08-06 DIAGNOSIS — Z992 Dependence on renal dialysis: Secondary | ICD-10-CM | POA: Diagnosis not present

## 2020-08-07 DIAGNOSIS — Z992 Dependence on renal dialysis: Secondary | ICD-10-CM | POA: Diagnosis not present

## 2020-08-07 DIAGNOSIS — D509 Iron deficiency anemia, unspecified: Secondary | ICD-10-CM | POA: Diagnosis not present

## 2020-08-07 DIAGNOSIS — N186 End stage renal disease: Secondary | ICD-10-CM | POA: Diagnosis not present

## 2020-08-07 DIAGNOSIS — D631 Anemia in chronic kidney disease: Secondary | ICD-10-CM | POA: Diagnosis not present

## 2020-08-07 DIAGNOSIS — N2581 Secondary hyperparathyroidism of renal origin: Secondary | ICD-10-CM | POA: Diagnosis not present

## 2020-08-08 DIAGNOSIS — N2581 Secondary hyperparathyroidism of renal origin: Secondary | ICD-10-CM | POA: Diagnosis not present

## 2020-08-08 DIAGNOSIS — D509 Iron deficiency anemia, unspecified: Secondary | ICD-10-CM | POA: Diagnosis not present

## 2020-08-08 DIAGNOSIS — Z992 Dependence on renal dialysis: Secondary | ICD-10-CM | POA: Diagnosis not present

## 2020-08-08 DIAGNOSIS — N186 End stage renal disease: Secondary | ICD-10-CM | POA: Diagnosis not present

## 2020-08-08 DIAGNOSIS — D631 Anemia in chronic kidney disease: Secondary | ICD-10-CM | POA: Diagnosis not present

## 2020-08-09 DIAGNOSIS — Z992 Dependence on renal dialysis: Secondary | ICD-10-CM | POA: Diagnosis not present

## 2020-08-09 DIAGNOSIS — D631 Anemia in chronic kidney disease: Secondary | ICD-10-CM | POA: Diagnosis not present

## 2020-08-09 DIAGNOSIS — N186 End stage renal disease: Secondary | ICD-10-CM | POA: Diagnosis not present

## 2020-08-09 DIAGNOSIS — N2581 Secondary hyperparathyroidism of renal origin: Secondary | ICD-10-CM | POA: Diagnosis not present

## 2020-08-09 DIAGNOSIS — D509 Iron deficiency anemia, unspecified: Secondary | ICD-10-CM | POA: Diagnosis not present

## 2020-08-10 DIAGNOSIS — D509 Iron deficiency anemia, unspecified: Secondary | ICD-10-CM | POA: Diagnosis not present

## 2020-08-10 DIAGNOSIS — N186 End stage renal disease: Secondary | ICD-10-CM | POA: Diagnosis not present

## 2020-08-10 DIAGNOSIS — N2581 Secondary hyperparathyroidism of renal origin: Secondary | ICD-10-CM | POA: Diagnosis not present

## 2020-08-10 DIAGNOSIS — Z992 Dependence on renal dialysis: Secondary | ICD-10-CM | POA: Diagnosis not present

## 2020-08-10 DIAGNOSIS — D631 Anemia in chronic kidney disease: Secondary | ICD-10-CM | POA: Diagnosis not present

## 2020-08-11 DIAGNOSIS — N186 End stage renal disease: Secondary | ICD-10-CM | POA: Diagnosis not present

## 2020-08-11 DIAGNOSIS — N2581 Secondary hyperparathyroidism of renal origin: Secondary | ICD-10-CM | POA: Diagnosis not present

## 2020-08-11 DIAGNOSIS — Z992 Dependence on renal dialysis: Secondary | ICD-10-CM | POA: Diagnosis not present

## 2020-08-11 DIAGNOSIS — D631 Anemia in chronic kidney disease: Secondary | ICD-10-CM | POA: Diagnosis not present

## 2020-08-11 DIAGNOSIS — D509 Iron deficiency anemia, unspecified: Secondary | ICD-10-CM | POA: Diagnosis not present

## 2020-08-12 DIAGNOSIS — Z992 Dependence on renal dialysis: Secondary | ICD-10-CM | POA: Diagnosis not present

## 2020-08-12 DIAGNOSIS — D509 Iron deficiency anemia, unspecified: Secondary | ICD-10-CM | POA: Diagnosis not present

## 2020-08-12 DIAGNOSIS — N186 End stage renal disease: Secondary | ICD-10-CM | POA: Diagnosis not present

## 2020-08-12 DIAGNOSIS — N2581 Secondary hyperparathyroidism of renal origin: Secondary | ICD-10-CM | POA: Diagnosis not present

## 2020-08-12 DIAGNOSIS — D631 Anemia in chronic kidney disease: Secondary | ICD-10-CM | POA: Diagnosis not present

## 2020-08-13 DIAGNOSIS — D509 Iron deficiency anemia, unspecified: Secondary | ICD-10-CM | POA: Diagnosis not present

## 2020-08-13 DIAGNOSIS — D631 Anemia in chronic kidney disease: Secondary | ICD-10-CM | POA: Diagnosis not present

## 2020-08-13 DIAGNOSIS — N2581 Secondary hyperparathyroidism of renal origin: Secondary | ICD-10-CM | POA: Diagnosis not present

## 2020-08-13 DIAGNOSIS — N186 End stage renal disease: Secondary | ICD-10-CM | POA: Diagnosis not present

## 2020-08-13 DIAGNOSIS — Z992 Dependence on renal dialysis: Secondary | ICD-10-CM | POA: Diagnosis not present

## 2020-08-14 DIAGNOSIS — N186 End stage renal disease: Secondary | ICD-10-CM | POA: Diagnosis not present

## 2020-08-14 DIAGNOSIS — D509 Iron deficiency anemia, unspecified: Secondary | ICD-10-CM | POA: Diagnosis not present

## 2020-08-14 DIAGNOSIS — N2581 Secondary hyperparathyroidism of renal origin: Secondary | ICD-10-CM | POA: Diagnosis not present

## 2020-08-14 DIAGNOSIS — Z992 Dependence on renal dialysis: Secondary | ICD-10-CM | POA: Diagnosis not present

## 2020-08-14 DIAGNOSIS — D631 Anemia in chronic kidney disease: Secondary | ICD-10-CM | POA: Diagnosis not present

## 2020-08-15 DIAGNOSIS — D631 Anemia in chronic kidney disease: Secondary | ICD-10-CM | POA: Diagnosis not present

## 2020-08-15 DIAGNOSIS — N2581 Secondary hyperparathyroidism of renal origin: Secondary | ICD-10-CM | POA: Diagnosis not present

## 2020-08-15 DIAGNOSIS — N186 End stage renal disease: Secondary | ICD-10-CM | POA: Diagnosis not present

## 2020-08-15 DIAGNOSIS — D509 Iron deficiency anemia, unspecified: Secondary | ICD-10-CM | POA: Diagnosis not present

## 2020-08-15 DIAGNOSIS — Z992 Dependence on renal dialysis: Secondary | ICD-10-CM | POA: Diagnosis not present

## 2020-08-16 DIAGNOSIS — Z992 Dependence on renal dialysis: Secondary | ICD-10-CM | POA: Diagnosis not present

## 2020-08-16 DIAGNOSIS — D631 Anemia in chronic kidney disease: Secondary | ICD-10-CM | POA: Diagnosis not present

## 2020-08-16 DIAGNOSIS — D509 Iron deficiency anemia, unspecified: Secondary | ICD-10-CM | POA: Diagnosis not present

## 2020-08-16 DIAGNOSIS — N2581 Secondary hyperparathyroidism of renal origin: Secondary | ICD-10-CM | POA: Diagnosis not present

## 2020-08-16 DIAGNOSIS — N186 End stage renal disease: Secondary | ICD-10-CM | POA: Diagnosis not present

## 2020-08-17 DIAGNOSIS — D631 Anemia in chronic kidney disease: Secondary | ICD-10-CM | POA: Diagnosis not present

## 2020-08-17 DIAGNOSIS — D509 Iron deficiency anemia, unspecified: Secondary | ICD-10-CM | POA: Diagnosis not present

## 2020-08-17 DIAGNOSIS — N186 End stage renal disease: Secondary | ICD-10-CM | POA: Diagnosis not present

## 2020-08-17 DIAGNOSIS — Z992 Dependence on renal dialysis: Secondary | ICD-10-CM | POA: Diagnosis not present

## 2020-08-17 DIAGNOSIS — N2581 Secondary hyperparathyroidism of renal origin: Secondary | ICD-10-CM | POA: Diagnosis not present

## 2020-08-18 DIAGNOSIS — N186 End stage renal disease: Secondary | ICD-10-CM | POA: Diagnosis not present

## 2020-08-18 DIAGNOSIS — D631 Anemia in chronic kidney disease: Secondary | ICD-10-CM | POA: Diagnosis not present

## 2020-08-18 DIAGNOSIS — N2581 Secondary hyperparathyroidism of renal origin: Secondary | ICD-10-CM | POA: Diagnosis not present

## 2020-08-18 DIAGNOSIS — Z992 Dependence on renal dialysis: Secondary | ICD-10-CM | POA: Diagnosis not present

## 2020-08-18 DIAGNOSIS — D509 Iron deficiency anemia, unspecified: Secondary | ICD-10-CM | POA: Diagnosis not present

## 2020-08-19 DIAGNOSIS — D509 Iron deficiency anemia, unspecified: Secondary | ICD-10-CM | POA: Diagnosis not present

## 2020-08-19 DIAGNOSIS — N2581 Secondary hyperparathyroidism of renal origin: Secondary | ICD-10-CM | POA: Diagnosis not present

## 2020-08-19 DIAGNOSIS — Z992 Dependence on renal dialysis: Secondary | ICD-10-CM | POA: Diagnosis not present

## 2020-08-19 DIAGNOSIS — D631 Anemia in chronic kidney disease: Secondary | ICD-10-CM | POA: Diagnosis not present

## 2020-08-19 DIAGNOSIS — N186 End stage renal disease: Secondary | ICD-10-CM | POA: Diagnosis not present

## 2020-08-20 DIAGNOSIS — D509 Iron deficiency anemia, unspecified: Secondary | ICD-10-CM | POA: Diagnosis not present

## 2020-08-20 DIAGNOSIS — Z992 Dependence on renal dialysis: Secondary | ICD-10-CM | POA: Diagnosis not present

## 2020-08-20 DIAGNOSIS — D631 Anemia in chronic kidney disease: Secondary | ICD-10-CM | POA: Diagnosis not present

## 2020-08-20 DIAGNOSIS — N2581 Secondary hyperparathyroidism of renal origin: Secondary | ICD-10-CM | POA: Diagnosis not present

## 2020-08-20 DIAGNOSIS — N186 End stage renal disease: Secondary | ICD-10-CM | POA: Diagnosis not present

## 2020-08-21 DIAGNOSIS — Z992 Dependence on renal dialysis: Secondary | ICD-10-CM | POA: Diagnosis not present

## 2020-08-21 DIAGNOSIS — D631 Anemia in chronic kidney disease: Secondary | ICD-10-CM | POA: Diagnosis not present

## 2020-08-21 DIAGNOSIS — N2581 Secondary hyperparathyroidism of renal origin: Secondary | ICD-10-CM | POA: Diagnosis not present

## 2020-08-21 DIAGNOSIS — D509 Iron deficiency anemia, unspecified: Secondary | ICD-10-CM | POA: Diagnosis not present

## 2020-08-21 DIAGNOSIS — N186 End stage renal disease: Secondary | ICD-10-CM | POA: Diagnosis not present

## 2020-08-22 DIAGNOSIS — D631 Anemia in chronic kidney disease: Secondary | ICD-10-CM | POA: Diagnosis not present

## 2020-08-22 DIAGNOSIS — D509 Iron deficiency anemia, unspecified: Secondary | ICD-10-CM | POA: Diagnosis not present

## 2020-08-22 DIAGNOSIS — N186 End stage renal disease: Secondary | ICD-10-CM | POA: Diagnosis not present

## 2020-08-22 DIAGNOSIS — Z992 Dependence on renal dialysis: Secondary | ICD-10-CM | POA: Diagnosis not present

## 2020-08-23 DIAGNOSIS — D631 Anemia in chronic kidney disease: Secondary | ICD-10-CM | POA: Diagnosis not present

## 2020-08-23 DIAGNOSIS — N186 End stage renal disease: Secondary | ICD-10-CM | POA: Diagnosis not present

## 2020-08-23 DIAGNOSIS — Z992 Dependence on renal dialysis: Secondary | ICD-10-CM | POA: Diagnosis not present

## 2020-08-23 DIAGNOSIS — D509 Iron deficiency anemia, unspecified: Secondary | ICD-10-CM | POA: Diagnosis not present

## 2020-08-24 DIAGNOSIS — Z992 Dependence on renal dialysis: Secondary | ICD-10-CM | POA: Diagnosis not present

## 2020-08-24 DIAGNOSIS — D509 Iron deficiency anemia, unspecified: Secondary | ICD-10-CM | POA: Diagnosis not present

## 2020-08-24 DIAGNOSIS — D631 Anemia in chronic kidney disease: Secondary | ICD-10-CM | POA: Diagnosis not present

## 2020-08-24 DIAGNOSIS — N186 End stage renal disease: Secondary | ICD-10-CM | POA: Diagnosis not present

## 2020-08-25 DIAGNOSIS — Z992 Dependence on renal dialysis: Secondary | ICD-10-CM | POA: Diagnosis not present

## 2020-08-25 DIAGNOSIS — D509 Iron deficiency anemia, unspecified: Secondary | ICD-10-CM | POA: Diagnosis not present

## 2020-08-25 DIAGNOSIS — N186 End stage renal disease: Secondary | ICD-10-CM | POA: Diagnosis not present

## 2020-08-25 DIAGNOSIS — D631 Anemia in chronic kidney disease: Secondary | ICD-10-CM | POA: Diagnosis not present

## 2020-08-26 DIAGNOSIS — N186 End stage renal disease: Secondary | ICD-10-CM | POA: Diagnosis not present

## 2020-08-26 DIAGNOSIS — Z992 Dependence on renal dialysis: Secondary | ICD-10-CM | POA: Diagnosis not present

## 2020-08-26 DIAGNOSIS — D631 Anemia in chronic kidney disease: Secondary | ICD-10-CM | POA: Diagnosis not present

## 2020-08-26 DIAGNOSIS — D509 Iron deficiency anemia, unspecified: Secondary | ICD-10-CM | POA: Diagnosis not present

## 2020-08-27 DIAGNOSIS — D631 Anemia in chronic kidney disease: Secondary | ICD-10-CM | POA: Diagnosis not present

## 2020-08-27 DIAGNOSIS — N186 End stage renal disease: Secondary | ICD-10-CM | POA: Diagnosis not present

## 2020-08-27 DIAGNOSIS — D509 Iron deficiency anemia, unspecified: Secondary | ICD-10-CM | POA: Diagnosis not present

## 2020-08-27 DIAGNOSIS — Z992 Dependence on renal dialysis: Secondary | ICD-10-CM | POA: Diagnosis not present

## 2020-08-28 DIAGNOSIS — D631 Anemia in chronic kidney disease: Secondary | ICD-10-CM | POA: Diagnosis not present

## 2020-08-28 DIAGNOSIS — Z992 Dependence on renal dialysis: Secondary | ICD-10-CM | POA: Diagnosis not present

## 2020-08-28 DIAGNOSIS — N186 End stage renal disease: Secondary | ICD-10-CM | POA: Diagnosis not present

## 2020-08-28 DIAGNOSIS — D509 Iron deficiency anemia, unspecified: Secondary | ICD-10-CM | POA: Diagnosis not present

## 2020-08-29 DIAGNOSIS — D509 Iron deficiency anemia, unspecified: Secondary | ICD-10-CM | POA: Diagnosis not present

## 2020-08-29 DIAGNOSIS — D631 Anemia in chronic kidney disease: Secondary | ICD-10-CM | POA: Diagnosis not present

## 2020-08-29 DIAGNOSIS — N186 End stage renal disease: Secondary | ICD-10-CM | POA: Diagnosis not present

## 2020-08-29 DIAGNOSIS — Z992 Dependence on renal dialysis: Secondary | ICD-10-CM | POA: Diagnosis not present

## 2020-08-30 DIAGNOSIS — D509 Iron deficiency anemia, unspecified: Secondary | ICD-10-CM | POA: Diagnosis not present

## 2020-08-30 DIAGNOSIS — N186 End stage renal disease: Secondary | ICD-10-CM | POA: Diagnosis not present

## 2020-08-30 DIAGNOSIS — Z992 Dependence on renal dialysis: Secondary | ICD-10-CM | POA: Diagnosis not present

## 2020-08-30 DIAGNOSIS — D631 Anemia in chronic kidney disease: Secondary | ICD-10-CM | POA: Diagnosis not present

## 2020-08-31 DIAGNOSIS — N186 End stage renal disease: Secondary | ICD-10-CM | POA: Diagnosis not present

## 2020-08-31 DIAGNOSIS — Z992 Dependence on renal dialysis: Secondary | ICD-10-CM | POA: Diagnosis not present

## 2020-08-31 DIAGNOSIS — D631 Anemia in chronic kidney disease: Secondary | ICD-10-CM | POA: Diagnosis not present

## 2020-08-31 DIAGNOSIS — D509 Iron deficiency anemia, unspecified: Secondary | ICD-10-CM | POA: Diagnosis not present

## 2020-09-01 DIAGNOSIS — D509 Iron deficiency anemia, unspecified: Secondary | ICD-10-CM | POA: Diagnosis not present

## 2020-09-01 DIAGNOSIS — D631 Anemia in chronic kidney disease: Secondary | ICD-10-CM | POA: Diagnosis not present

## 2020-09-01 DIAGNOSIS — Z992 Dependence on renal dialysis: Secondary | ICD-10-CM | POA: Diagnosis not present

## 2020-09-01 DIAGNOSIS — N186 End stage renal disease: Secondary | ICD-10-CM | POA: Diagnosis not present

## 2020-09-02 DIAGNOSIS — D509 Iron deficiency anemia, unspecified: Secondary | ICD-10-CM | POA: Diagnosis not present

## 2020-09-02 DIAGNOSIS — N186 End stage renal disease: Secondary | ICD-10-CM | POA: Diagnosis not present

## 2020-09-02 DIAGNOSIS — D631 Anemia in chronic kidney disease: Secondary | ICD-10-CM | POA: Diagnosis not present

## 2020-09-02 DIAGNOSIS — Z992 Dependence on renal dialysis: Secondary | ICD-10-CM | POA: Diagnosis not present

## 2020-09-03 DIAGNOSIS — D509 Iron deficiency anemia, unspecified: Secondary | ICD-10-CM | POA: Diagnosis not present

## 2020-09-03 DIAGNOSIS — D631 Anemia in chronic kidney disease: Secondary | ICD-10-CM | POA: Diagnosis not present

## 2020-09-03 DIAGNOSIS — Z992 Dependence on renal dialysis: Secondary | ICD-10-CM | POA: Diagnosis not present

## 2020-09-03 DIAGNOSIS — N186 End stage renal disease: Secondary | ICD-10-CM | POA: Diagnosis not present

## 2020-09-04 DIAGNOSIS — D631 Anemia in chronic kidney disease: Secondary | ICD-10-CM | POA: Diagnosis not present

## 2020-09-04 DIAGNOSIS — D509 Iron deficiency anemia, unspecified: Secondary | ICD-10-CM | POA: Diagnosis not present

## 2020-09-04 DIAGNOSIS — N186 End stage renal disease: Secondary | ICD-10-CM | POA: Diagnosis not present

## 2020-09-04 DIAGNOSIS — Z992 Dependence on renal dialysis: Secondary | ICD-10-CM | POA: Diagnosis not present

## 2020-09-05 DIAGNOSIS — Z992 Dependence on renal dialysis: Secondary | ICD-10-CM | POA: Diagnosis not present

## 2020-09-05 DIAGNOSIS — D509 Iron deficiency anemia, unspecified: Secondary | ICD-10-CM | POA: Diagnosis not present

## 2020-09-05 DIAGNOSIS — D631 Anemia in chronic kidney disease: Secondary | ICD-10-CM | POA: Diagnosis not present

## 2020-09-05 DIAGNOSIS — N186 End stage renal disease: Secondary | ICD-10-CM | POA: Diagnosis not present

## 2020-09-06 DIAGNOSIS — Z992 Dependence on renal dialysis: Secondary | ICD-10-CM | POA: Diagnosis not present

## 2020-09-06 DIAGNOSIS — D631 Anemia in chronic kidney disease: Secondary | ICD-10-CM | POA: Diagnosis not present

## 2020-09-06 DIAGNOSIS — N186 End stage renal disease: Secondary | ICD-10-CM | POA: Diagnosis not present

## 2020-09-06 DIAGNOSIS — D509 Iron deficiency anemia, unspecified: Secondary | ICD-10-CM | POA: Diagnosis not present

## 2020-09-07 DIAGNOSIS — N186 End stage renal disease: Secondary | ICD-10-CM | POA: Diagnosis not present

## 2020-09-07 DIAGNOSIS — D509 Iron deficiency anemia, unspecified: Secondary | ICD-10-CM | POA: Diagnosis not present

## 2020-09-07 DIAGNOSIS — Z992 Dependence on renal dialysis: Secondary | ICD-10-CM | POA: Diagnosis not present

## 2020-09-07 DIAGNOSIS — D631 Anemia in chronic kidney disease: Secondary | ICD-10-CM | POA: Diagnosis not present

## 2020-09-08 DIAGNOSIS — D509 Iron deficiency anemia, unspecified: Secondary | ICD-10-CM | POA: Diagnosis not present

## 2020-09-08 DIAGNOSIS — N186 End stage renal disease: Secondary | ICD-10-CM | POA: Diagnosis not present

## 2020-09-08 DIAGNOSIS — Z992 Dependence on renal dialysis: Secondary | ICD-10-CM | POA: Diagnosis not present

## 2020-09-08 DIAGNOSIS — D631 Anemia in chronic kidney disease: Secondary | ICD-10-CM | POA: Diagnosis not present

## 2020-09-09 DIAGNOSIS — Z992 Dependence on renal dialysis: Secondary | ICD-10-CM | POA: Diagnosis not present

## 2020-09-09 DIAGNOSIS — D631 Anemia in chronic kidney disease: Secondary | ICD-10-CM | POA: Diagnosis not present

## 2020-09-09 DIAGNOSIS — D509 Iron deficiency anemia, unspecified: Secondary | ICD-10-CM | POA: Diagnosis not present

## 2020-09-09 DIAGNOSIS — N186 End stage renal disease: Secondary | ICD-10-CM | POA: Diagnosis not present

## 2020-09-10 DIAGNOSIS — N186 End stage renal disease: Secondary | ICD-10-CM | POA: Diagnosis not present

## 2020-09-10 DIAGNOSIS — D631 Anemia in chronic kidney disease: Secondary | ICD-10-CM | POA: Diagnosis not present

## 2020-09-10 DIAGNOSIS — Z992 Dependence on renal dialysis: Secondary | ICD-10-CM | POA: Diagnosis not present

## 2020-09-10 DIAGNOSIS — D509 Iron deficiency anemia, unspecified: Secondary | ICD-10-CM | POA: Diagnosis not present

## 2020-09-11 DIAGNOSIS — Z992 Dependence on renal dialysis: Secondary | ICD-10-CM | POA: Diagnosis not present

## 2020-09-11 DIAGNOSIS — D509 Iron deficiency anemia, unspecified: Secondary | ICD-10-CM | POA: Diagnosis not present

## 2020-09-11 DIAGNOSIS — N186 End stage renal disease: Secondary | ICD-10-CM | POA: Diagnosis not present

## 2020-09-11 DIAGNOSIS — D631 Anemia in chronic kidney disease: Secondary | ICD-10-CM | POA: Diagnosis not present

## 2020-09-12 DIAGNOSIS — Z992 Dependence on renal dialysis: Secondary | ICD-10-CM | POA: Diagnosis not present

## 2020-09-12 DIAGNOSIS — D509 Iron deficiency anemia, unspecified: Secondary | ICD-10-CM | POA: Diagnosis not present

## 2020-09-12 DIAGNOSIS — N186 End stage renal disease: Secondary | ICD-10-CM | POA: Diagnosis not present

## 2020-09-12 DIAGNOSIS — D631 Anemia in chronic kidney disease: Secondary | ICD-10-CM | POA: Diagnosis not present

## 2020-09-13 DIAGNOSIS — N186 End stage renal disease: Secondary | ICD-10-CM | POA: Diagnosis not present

## 2020-09-13 DIAGNOSIS — Z992 Dependence on renal dialysis: Secondary | ICD-10-CM | POA: Diagnosis not present

## 2020-09-13 DIAGNOSIS — D631 Anemia in chronic kidney disease: Secondary | ICD-10-CM | POA: Diagnosis not present

## 2020-09-13 DIAGNOSIS — D509 Iron deficiency anemia, unspecified: Secondary | ICD-10-CM | POA: Diagnosis not present

## 2020-09-14 DIAGNOSIS — D509 Iron deficiency anemia, unspecified: Secondary | ICD-10-CM | POA: Diagnosis not present

## 2020-09-14 DIAGNOSIS — Z992 Dependence on renal dialysis: Secondary | ICD-10-CM | POA: Diagnosis not present

## 2020-09-14 DIAGNOSIS — N186 End stage renal disease: Secondary | ICD-10-CM | POA: Diagnosis not present

## 2020-09-14 DIAGNOSIS — D631 Anemia in chronic kidney disease: Secondary | ICD-10-CM | POA: Diagnosis not present

## 2020-09-15 DIAGNOSIS — D509 Iron deficiency anemia, unspecified: Secondary | ICD-10-CM | POA: Diagnosis not present

## 2020-09-15 DIAGNOSIS — D631 Anemia in chronic kidney disease: Secondary | ICD-10-CM | POA: Diagnosis not present

## 2020-09-15 DIAGNOSIS — Z992 Dependence on renal dialysis: Secondary | ICD-10-CM | POA: Diagnosis not present

## 2020-09-15 DIAGNOSIS — N186 End stage renal disease: Secondary | ICD-10-CM | POA: Diagnosis not present

## 2020-09-16 DIAGNOSIS — Z992 Dependence on renal dialysis: Secondary | ICD-10-CM | POA: Diagnosis not present

## 2020-09-16 DIAGNOSIS — D631 Anemia in chronic kidney disease: Secondary | ICD-10-CM | POA: Diagnosis not present

## 2020-09-16 DIAGNOSIS — N186 End stage renal disease: Secondary | ICD-10-CM | POA: Diagnosis not present

## 2020-09-16 DIAGNOSIS — D509 Iron deficiency anemia, unspecified: Secondary | ICD-10-CM | POA: Diagnosis not present

## 2020-09-17 DIAGNOSIS — D631 Anemia in chronic kidney disease: Secondary | ICD-10-CM | POA: Diagnosis not present

## 2020-09-17 DIAGNOSIS — N186 End stage renal disease: Secondary | ICD-10-CM | POA: Diagnosis not present

## 2020-09-17 DIAGNOSIS — Z992 Dependence on renal dialysis: Secondary | ICD-10-CM | POA: Diagnosis not present

## 2020-09-17 DIAGNOSIS — D509 Iron deficiency anemia, unspecified: Secondary | ICD-10-CM | POA: Diagnosis not present

## 2020-09-18 DIAGNOSIS — Z992 Dependence on renal dialysis: Secondary | ICD-10-CM | POA: Diagnosis not present

## 2020-09-18 DIAGNOSIS — N186 End stage renal disease: Secondary | ICD-10-CM | POA: Diagnosis not present

## 2020-09-18 DIAGNOSIS — D509 Iron deficiency anemia, unspecified: Secondary | ICD-10-CM | POA: Diagnosis not present

## 2020-09-18 DIAGNOSIS — D631 Anemia in chronic kidney disease: Secondary | ICD-10-CM | POA: Diagnosis not present

## 2020-09-19 DIAGNOSIS — D509 Iron deficiency anemia, unspecified: Secondary | ICD-10-CM | POA: Diagnosis not present

## 2020-09-19 DIAGNOSIS — Z992 Dependence on renal dialysis: Secondary | ICD-10-CM | POA: Diagnosis not present

## 2020-09-19 DIAGNOSIS — D631 Anemia in chronic kidney disease: Secondary | ICD-10-CM | POA: Diagnosis not present

## 2020-09-19 DIAGNOSIS — N186 End stage renal disease: Secondary | ICD-10-CM | POA: Diagnosis not present

## 2020-09-20 DIAGNOSIS — D631 Anemia in chronic kidney disease: Secondary | ICD-10-CM | POA: Diagnosis not present

## 2020-09-20 DIAGNOSIS — D509 Iron deficiency anemia, unspecified: Secondary | ICD-10-CM | POA: Diagnosis not present

## 2020-09-20 DIAGNOSIS — N186 End stage renal disease: Secondary | ICD-10-CM | POA: Diagnosis not present

## 2020-09-20 DIAGNOSIS — Z992 Dependence on renal dialysis: Secondary | ICD-10-CM | POA: Diagnosis not present

## 2020-09-21 DIAGNOSIS — D631 Anemia in chronic kidney disease: Secondary | ICD-10-CM | POA: Diagnosis not present

## 2020-09-21 DIAGNOSIS — Z992 Dependence on renal dialysis: Secondary | ICD-10-CM | POA: Diagnosis not present

## 2020-09-21 DIAGNOSIS — D509 Iron deficiency anemia, unspecified: Secondary | ICD-10-CM | POA: Diagnosis not present

## 2020-09-21 DIAGNOSIS — N186 End stage renal disease: Secondary | ICD-10-CM | POA: Diagnosis not present

## 2020-09-22 DIAGNOSIS — Z992 Dependence on renal dialysis: Secondary | ICD-10-CM | POA: Diagnosis not present

## 2020-09-22 DIAGNOSIS — D631 Anemia in chronic kidney disease: Secondary | ICD-10-CM | POA: Diagnosis not present

## 2020-09-22 DIAGNOSIS — N186 End stage renal disease: Secondary | ICD-10-CM | POA: Diagnosis not present

## 2020-09-22 DIAGNOSIS — D509 Iron deficiency anemia, unspecified: Secondary | ICD-10-CM | POA: Diagnosis not present

## 2020-09-23 DIAGNOSIS — N186 End stage renal disease: Secondary | ICD-10-CM | POA: Diagnosis not present

## 2020-09-23 DIAGNOSIS — Z992 Dependence on renal dialysis: Secondary | ICD-10-CM | POA: Diagnosis not present

## 2020-09-23 DIAGNOSIS — D509 Iron deficiency anemia, unspecified: Secondary | ICD-10-CM | POA: Diagnosis not present

## 2020-09-23 DIAGNOSIS — D631 Anemia in chronic kidney disease: Secondary | ICD-10-CM | POA: Diagnosis not present

## 2020-09-24 DIAGNOSIS — D631 Anemia in chronic kidney disease: Secondary | ICD-10-CM | POA: Diagnosis not present

## 2020-09-24 DIAGNOSIS — D509 Iron deficiency anemia, unspecified: Secondary | ICD-10-CM | POA: Diagnosis not present

## 2020-09-24 DIAGNOSIS — N186 End stage renal disease: Secondary | ICD-10-CM | POA: Diagnosis not present

## 2020-09-24 DIAGNOSIS — Z992 Dependence on renal dialysis: Secondary | ICD-10-CM | POA: Diagnosis not present

## 2020-09-25 DIAGNOSIS — Z992 Dependence on renal dialysis: Secondary | ICD-10-CM | POA: Diagnosis not present

## 2020-09-25 DIAGNOSIS — D631 Anemia in chronic kidney disease: Secondary | ICD-10-CM | POA: Diagnosis not present

## 2020-09-25 DIAGNOSIS — N186 End stage renal disease: Secondary | ICD-10-CM | POA: Diagnosis not present

## 2020-09-25 DIAGNOSIS — D509 Iron deficiency anemia, unspecified: Secondary | ICD-10-CM | POA: Diagnosis not present

## 2020-09-26 DIAGNOSIS — N186 End stage renal disease: Secondary | ICD-10-CM | POA: Diagnosis not present

## 2020-09-26 DIAGNOSIS — Z992 Dependence on renal dialysis: Secondary | ICD-10-CM | POA: Diagnosis not present

## 2020-09-26 DIAGNOSIS — D631 Anemia in chronic kidney disease: Secondary | ICD-10-CM | POA: Diagnosis not present

## 2020-09-26 DIAGNOSIS — D509 Iron deficiency anemia, unspecified: Secondary | ICD-10-CM | POA: Diagnosis not present

## 2020-09-27 DIAGNOSIS — D509 Iron deficiency anemia, unspecified: Secondary | ICD-10-CM | POA: Diagnosis not present

## 2020-09-27 DIAGNOSIS — D631 Anemia in chronic kidney disease: Secondary | ICD-10-CM | POA: Diagnosis not present

## 2020-09-27 DIAGNOSIS — Z992 Dependence on renal dialysis: Secondary | ICD-10-CM | POA: Diagnosis not present

## 2020-09-27 DIAGNOSIS — N186 End stage renal disease: Secondary | ICD-10-CM | POA: Diagnosis not present

## 2020-09-28 DIAGNOSIS — N186 End stage renal disease: Secondary | ICD-10-CM | POA: Diagnosis not present

## 2020-09-28 DIAGNOSIS — Z992 Dependence on renal dialysis: Secondary | ICD-10-CM | POA: Diagnosis not present

## 2020-09-28 DIAGNOSIS — D631 Anemia in chronic kidney disease: Secondary | ICD-10-CM | POA: Diagnosis not present

## 2020-09-28 DIAGNOSIS — D509 Iron deficiency anemia, unspecified: Secondary | ICD-10-CM | POA: Diagnosis not present

## 2020-09-29 DIAGNOSIS — N186 End stage renal disease: Secondary | ICD-10-CM | POA: Diagnosis not present

## 2020-09-29 DIAGNOSIS — Z992 Dependence on renal dialysis: Secondary | ICD-10-CM | POA: Diagnosis not present

## 2020-09-29 DIAGNOSIS — D631 Anemia in chronic kidney disease: Secondary | ICD-10-CM | POA: Diagnosis not present

## 2020-09-29 DIAGNOSIS — D509 Iron deficiency anemia, unspecified: Secondary | ICD-10-CM | POA: Diagnosis not present

## 2020-09-30 DIAGNOSIS — Z992 Dependence on renal dialysis: Secondary | ICD-10-CM | POA: Diagnosis not present

## 2020-09-30 DIAGNOSIS — D631 Anemia in chronic kidney disease: Secondary | ICD-10-CM | POA: Diagnosis not present

## 2020-09-30 DIAGNOSIS — D509 Iron deficiency anemia, unspecified: Secondary | ICD-10-CM | POA: Diagnosis not present

## 2020-09-30 DIAGNOSIS — N186 End stage renal disease: Secondary | ICD-10-CM | POA: Diagnosis not present

## 2020-10-01 DIAGNOSIS — D509 Iron deficiency anemia, unspecified: Secondary | ICD-10-CM | POA: Diagnosis not present

## 2020-10-01 DIAGNOSIS — N186 End stage renal disease: Secondary | ICD-10-CM | POA: Diagnosis not present

## 2020-10-01 DIAGNOSIS — D631 Anemia in chronic kidney disease: Secondary | ICD-10-CM | POA: Diagnosis not present

## 2020-10-01 DIAGNOSIS — Z992 Dependence on renal dialysis: Secondary | ICD-10-CM | POA: Diagnosis not present

## 2020-10-02 DIAGNOSIS — N186 End stage renal disease: Secondary | ICD-10-CM | POA: Diagnosis not present

## 2020-10-02 DIAGNOSIS — D509 Iron deficiency anemia, unspecified: Secondary | ICD-10-CM | POA: Diagnosis not present

## 2020-10-02 DIAGNOSIS — Z992 Dependence on renal dialysis: Secondary | ICD-10-CM | POA: Diagnosis not present

## 2020-10-02 DIAGNOSIS — D631 Anemia in chronic kidney disease: Secondary | ICD-10-CM | POA: Diagnosis not present

## 2020-10-03 DIAGNOSIS — N186 End stage renal disease: Secondary | ICD-10-CM | POA: Diagnosis not present

## 2020-10-03 DIAGNOSIS — D631 Anemia in chronic kidney disease: Secondary | ICD-10-CM | POA: Diagnosis not present

## 2020-10-03 DIAGNOSIS — D509 Iron deficiency anemia, unspecified: Secondary | ICD-10-CM | POA: Diagnosis not present

## 2020-10-03 DIAGNOSIS — Z992 Dependence on renal dialysis: Secondary | ICD-10-CM | POA: Diagnosis not present

## 2020-10-04 DIAGNOSIS — D509 Iron deficiency anemia, unspecified: Secondary | ICD-10-CM | POA: Diagnosis not present

## 2020-10-04 DIAGNOSIS — Z992 Dependence on renal dialysis: Secondary | ICD-10-CM | POA: Diagnosis not present

## 2020-10-04 DIAGNOSIS — D631 Anemia in chronic kidney disease: Secondary | ICD-10-CM | POA: Diagnosis not present

## 2020-10-04 DIAGNOSIS — N186 End stage renal disease: Secondary | ICD-10-CM | POA: Diagnosis not present

## 2020-10-05 DIAGNOSIS — Z992 Dependence on renal dialysis: Secondary | ICD-10-CM | POA: Diagnosis not present

## 2020-10-05 DIAGNOSIS — D631 Anemia in chronic kidney disease: Secondary | ICD-10-CM | POA: Diagnosis not present

## 2020-10-05 DIAGNOSIS — N186 End stage renal disease: Secondary | ICD-10-CM | POA: Diagnosis not present

## 2020-10-05 DIAGNOSIS — D509 Iron deficiency anemia, unspecified: Secondary | ICD-10-CM | POA: Diagnosis not present

## 2020-10-06 DIAGNOSIS — D509 Iron deficiency anemia, unspecified: Secondary | ICD-10-CM | POA: Diagnosis not present

## 2020-10-06 DIAGNOSIS — D631 Anemia in chronic kidney disease: Secondary | ICD-10-CM | POA: Diagnosis not present

## 2020-10-06 DIAGNOSIS — Z992 Dependence on renal dialysis: Secondary | ICD-10-CM | POA: Diagnosis not present

## 2020-10-06 DIAGNOSIS — N186 End stage renal disease: Secondary | ICD-10-CM | POA: Diagnosis not present

## 2020-10-07 DIAGNOSIS — Z992 Dependence on renal dialysis: Secondary | ICD-10-CM | POA: Diagnosis not present

## 2020-10-07 DIAGNOSIS — N186 End stage renal disease: Secondary | ICD-10-CM | POA: Diagnosis not present

## 2020-10-07 DIAGNOSIS — D509 Iron deficiency anemia, unspecified: Secondary | ICD-10-CM | POA: Diagnosis not present

## 2020-10-07 DIAGNOSIS — D631 Anemia in chronic kidney disease: Secondary | ICD-10-CM | POA: Diagnosis not present

## 2020-10-08 DIAGNOSIS — N186 End stage renal disease: Secondary | ICD-10-CM | POA: Diagnosis not present

## 2020-10-08 DIAGNOSIS — D509 Iron deficiency anemia, unspecified: Secondary | ICD-10-CM | POA: Diagnosis not present

## 2020-10-08 DIAGNOSIS — D631 Anemia in chronic kidney disease: Secondary | ICD-10-CM | POA: Diagnosis not present

## 2020-10-08 DIAGNOSIS — Z992 Dependence on renal dialysis: Secondary | ICD-10-CM | POA: Diagnosis not present

## 2020-10-09 DIAGNOSIS — D509 Iron deficiency anemia, unspecified: Secondary | ICD-10-CM | POA: Diagnosis not present

## 2020-10-09 DIAGNOSIS — D631 Anemia in chronic kidney disease: Secondary | ICD-10-CM | POA: Diagnosis not present

## 2020-10-09 DIAGNOSIS — Z992 Dependence on renal dialysis: Secondary | ICD-10-CM | POA: Diagnosis not present

## 2020-10-09 DIAGNOSIS — N186 End stage renal disease: Secondary | ICD-10-CM | POA: Diagnosis not present

## 2020-10-10 DIAGNOSIS — D631 Anemia in chronic kidney disease: Secondary | ICD-10-CM | POA: Diagnosis not present

## 2020-10-10 DIAGNOSIS — Z992 Dependence on renal dialysis: Secondary | ICD-10-CM | POA: Diagnosis not present

## 2020-10-10 DIAGNOSIS — N186 End stage renal disease: Secondary | ICD-10-CM | POA: Diagnosis not present

## 2020-10-10 DIAGNOSIS — D509 Iron deficiency anemia, unspecified: Secondary | ICD-10-CM | POA: Diagnosis not present

## 2020-10-11 DIAGNOSIS — Z992 Dependence on renal dialysis: Secondary | ICD-10-CM | POA: Diagnosis not present

## 2020-10-11 DIAGNOSIS — D509 Iron deficiency anemia, unspecified: Secondary | ICD-10-CM | POA: Diagnosis not present

## 2020-10-11 DIAGNOSIS — N186 End stage renal disease: Secondary | ICD-10-CM | POA: Diagnosis not present

## 2020-10-11 DIAGNOSIS — D631 Anemia in chronic kidney disease: Secondary | ICD-10-CM | POA: Diagnosis not present

## 2020-10-12 DIAGNOSIS — N186 End stage renal disease: Secondary | ICD-10-CM | POA: Diagnosis not present

## 2020-10-12 DIAGNOSIS — D631 Anemia in chronic kidney disease: Secondary | ICD-10-CM | POA: Diagnosis not present

## 2020-10-12 DIAGNOSIS — D509 Iron deficiency anemia, unspecified: Secondary | ICD-10-CM | POA: Diagnosis not present

## 2020-10-12 DIAGNOSIS — Z992 Dependence on renal dialysis: Secondary | ICD-10-CM | POA: Diagnosis not present

## 2020-10-13 DIAGNOSIS — D509 Iron deficiency anemia, unspecified: Secondary | ICD-10-CM | POA: Diagnosis not present

## 2020-10-13 DIAGNOSIS — N186 End stage renal disease: Secondary | ICD-10-CM | POA: Diagnosis not present

## 2020-10-13 DIAGNOSIS — D631 Anemia in chronic kidney disease: Secondary | ICD-10-CM | POA: Diagnosis not present

## 2020-10-13 DIAGNOSIS — Z992 Dependence on renal dialysis: Secondary | ICD-10-CM | POA: Diagnosis not present

## 2020-10-14 DIAGNOSIS — D631 Anemia in chronic kidney disease: Secondary | ICD-10-CM | POA: Diagnosis not present

## 2020-10-14 DIAGNOSIS — N186 End stage renal disease: Secondary | ICD-10-CM | POA: Diagnosis not present

## 2020-10-14 DIAGNOSIS — D509 Iron deficiency anemia, unspecified: Secondary | ICD-10-CM | POA: Diagnosis not present

## 2020-10-14 DIAGNOSIS — Z992 Dependence on renal dialysis: Secondary | ICD-10-CM | POA: Diagnosis not present

## 2020-10-15 DIAGNOSIS — N186 End stage renal disease: Secondary | ICD-10-CM | POA: Diagnosis not present

## 2020-10-15 DIAGNOSIS — Z992 Dependence on renal dialysis: Secondary | ICD-10-CM | POA: Diagnosis not present

## 2020-10-15 DIAGNOSIS — D631 Anemia in chronic kidney disease: Secondary | ICD-10-CM | POA: Diagnosis not present

## 2020-10-15 DIAGNOSIS — D509 Iron deficiency anemia, unspecified: Secondary | ICD-10-CM | POA: Diagnosis not present

## 2020-10-16 DIAGNOSIS — N186 End stage renal disease: Secondary | ICD-10-CM | POA: Diagnosis not present

## 2020-10-16 DIAGNOSIS — Z992 Dependence on renal dialysis: Secondary | ICD-10-CM | POA: Diagnosis not present

## 2020-10-16 DIAGNOSIS — D631 Anemia in chronic kidney disease: Secondary | ICD-10-CM | POA: Diagnosis not present

## 2020-10-16 DIAGNOSIS — D509 Iron deficiency anemia, unspecified: Secondary | ICD-10-CM | POA: Diagnosis not present

## 2020-10-17 DIAGNOSIS — N186 End stage renal disease: Secondary | ICD-10-CM | POA: Diagnosis not present

## 2020-10-17 DIAGNOSIS — D509 Iron deficiency anemia, unspecified: Secondary | ICD-10-CM | POA: Diagnosis not present

## 2020-10-17 DIAGNOSIS — Z992 Dependence on renal dialysis: Secondary | ICD-10-CM | POA: Diagnosis not present

## 2020-10-17 DIAGNOSIS — D631 Anemia in chronic kidney disease: Secondary | ICD-10-CM | POA: Diagnosis not present

## 2020-10-18 DIAGNOSIS — D509 Iron deficiency anemia, unspecified: Secondary | ICD-10-CM | POA: Diagnosis not present

## 2020-10-18 DIAGNOSIS — D631 Anemia in chronic kidney disease: Secondary | ICD-10-CM | POA: Diagnosis not present

## 2020-10-18 DIAGNOSIS — N186 End stage renal disease: Secondary | ICD-10-CM | POA: Diagnosis not present

## 2020-10-18 DIAGNOSIS — Z992 Dependence on renal dialysis: Secondary | ICD-10-CM | POA: Diagnosis not present

## 2020-10-19 DIAGNOSIS — D631 Anemia in chronic kidney disease: Secondary | ICD-10-CM | POA: Diagnosis not present

## 2020-10-19 DIAGNOSIS — D509 Iron deficiency anemia, unspecified: Secondary | ICD-10-CM | POA: Diagnosis not present

## 2020-10-19 DIAGNOSIS — Z992 Dependence on renal dialysis: Secondary | ICD-10-CM | POA: Diagnosis not present

## 2020-10-19 DIAGNOSIS — N186 End stage renal disease: Secondary | ICD-10-CM | POA: Diagnosis not present

## 2020-10-20 DIAGNOSIS — N2581 Secondary hyperparathyroidism of renal origin: Secondary | ICD-10-CM | POA: Diagnosis not present

## 2020-10-20 DIAGNOSIS — N186 End stage renal disease: Secondary | ICD-10-CM | POA: Diagnosis not present

## 2020-10-20 DIAGNOSIS — D509 Iron deficiency anemia, unspecified: Secondary | ICD-10-CM | POA: Diagnosis not present

## 2020-10-20 DIAGNOSIS — D631 Anemia in chronic kidney disease: Secondary | ICD-10-CM | POA: Diagnosis not present

## 2020-10-20 DIAGNOSIS — Z992 Dependence on renal dialysis: Secondary | ICD-10-CM | POA: Diagnosis not present

## 2020-10-21 DIAGNOSIS — D509 Iron deficiency anemia, unspecified: Secondary | ICD-10-CM | POA: Diagnosis not present

## 2020-10-21 DIAGNOSIS — N186 End stage renal disease: Secondary | ICD-10-CM | POA: Diagnosis not present

## 2020-10-21 DIAGNOSIS — N2581 Secondary hyperparathyroidism of renal origin: Secondary | ICD-10-CM | POA: Diagnosis not present

## 2020-10-21 DIAGNOSIS — Z992 Dependence on renal dialysis: Secondary | ICD-10-CM | POA: Diagnosis not present

## 2020-10-21 DIAGNOSIS — D631 Anemia in chronic kidney disease: Secondary | ICD-10-CM | POA: Diagnosis not present

## 2020-10-22 DIAGNOSIS — D509 Iron deficiency anemia, unspecified: Secondary | ICD-10-CM | POA: Diagnosis not present

## 2020-10-22 DIAGNOSIS — N186 End stage renal disease: Secondary | ICD-10-CM | POA: Diagnosis not present

## 2020-10-22 DIAGNOSIS — D631 Anemia in chronic kidney disease: Secondary | ICD-10-CM | POA: Diagnosis not present

## 2020-10-22 DIAGNOSIS — Z992 Dependence on renal dialysis: Secondary | ICD-10-CM | POA: Diagnosis not present

## 2020-10-22 DIAGNOSIS — N2581 Secondary hyperparathyroidism of renal origin: Secondary | ICD-10-CM | POA: Diagnosis not present

## 2020-10-23 DIAGNOSIS — D509 Iron deficiency anemia, unspecified: Secondary | ICD-10-CM | POA: Diagnosis not present

## 2020-10-23 DIAGNOSIS — N2581 Secondary hyperparathyroidism of renal origin: Secondary | ICD-10-CM | POA: Diagnosis not present

## 2020-10-23 DIAGNOSIS — Z992 Dependence on renal dialysis: Secondary | ICD-10-CM | POA: Diagnosis not present

## 2020-10-23 DIAGNOSIS — N186 End stage renal disease: Secondary | ICD-10-CM | POA: Diagnosis not present

## 2020-10-23 DIAGNOSIS — D631 Anemia in chronic kidney disease: Secondary | ICD-10-CM | POA: Diagnosis not present

## 2020-10-24 DIAGNOSIS — N2581 Secondary hyperparathyroidism of renal origin: Secondary | ICD-10-CM | POA: Diagnosis not present

## 2020-10-24 DIAGNOSIS — Z992 Dependence on renal dialysis: Secondary | ICD-10-CM | POA: Diagnosis not present

## 2020-10-24 DIAGNOSIS — N186 End stage renal disease: Secondary | ICD-10-CM | POA: Diagnosis not present

## 2020-10-24 DIAGNOSIS — D631 Anemia in chronic kidney disease: Secondary | ICD-10-CM | POA: Diagnosis not present

## 2020-10-24 DIAGNOSIS — D509 Iron deficiency anemia, unspecified: Secondary | ICD-10-CM | POA: Diagnosis not present

## 2020-10-25 DIAGNOSIS — D631 Anemia in chronic kidney disease: Secondary | ICD-10-CM | POA: Diagnosis not present

## 2020-10-25 DIAGNOSIS — N186 End stage renal disease: Secondary | ICD-10-CM | POA: Diagnosis not present

## 2020-10-25 DIAGNOSIS — Z992 Dependence on renal dialysis: Secondary | ICD-10-CM | POA: Diagnosis not present

## 2020-10-25 DIAGNOSIS — N2581 Secondary hyperparathyroidism of renal origin: Secondary | ICD-10-CM | POA: Diagnosis not present

## 2020-10-25 DIAGNOSIS — D509 Iron deficiency anemia, unspecified: Secondary | ICD-10-CM | POA: Diagnosis not present

## 2020-10-26 DIAGNOSIS — N2581 Secondary hyperparathyroidism of renal origin: Secondary | ICD-10-CM | POA: Diagnosis not present

## 2020-10-26 DIAGNOSIS — D631 Anemia in chronic kidney disease: Secondary | ICD-10-CM | POA: Diagnosis not present

## 2020-10-26 DIAGNOSIS — N186 End stage renal disease: Secondary | ICD-10-CM | POA: Diagnosis not present

## 2020-10-26 DIAGNOSIS — D509 Iron deficiency anemia, unspecified: Secondary | ICD-10-CM | POA: Diagnosis not present

## 2020-10-26 DIAGNOSIS — Z992 Dependence on renal dialysis: Secondary | ICD-10-CM | POA: Diagnosis not present

## 2020-10-27 DIAGNOSIS — D509 Iron deficiency anemia, unspecified: Secondary | ICD-10-CM | POA: Diagnosis not present

## 2020-10-27 DIAGNOSIS — Z992 Dependence on renal dialysis: Secondary | ICD-10-CM | POA: Diagnosis not present

## 2020-10-27 DIAGNOSIS — N186 End stage renal disease: Secondary | ICD-10-CM | POA: Diagnosis not present

## 2020-10-27 DIAGNOSIS — D631 Anemia in chronic kidney disease: Secondary | ICD-10-CM | POA: Diagnosis not present

## 2020-10-27 DIAGNOSIS — N2581 Secondary hyperparathyroidism of renal origin: Secondary | ICD-10-CM | POA: Diagnosis not present

## 2020-10-28 DIAGNOSIS — D509 Iron deficiency anemia, unspecified: Secondary | ICD-10-CM | POA: Diagnosis not present

## 2020-10-28 DIAGNOSIS — N186 End stage renal disease: Secondary | ICD-10-CM | POA: Diagnosis not present

## 2020-10-28 DIAGNOSIS — N2581 Secondary hyperparathyroidism of renal origin: Secondary | ICD-10-CM | POA: Diagnosis not present

## 2020-10-28 DIAGNOSIS — Z992 Dependence on renal dialysis: Secondary | ICD-10-CM | POA: Diagnosis not present

## 2020-10-28 DIAGNOSIS — D631 Anemia in chronic kidney disease: Secondary | ICD-10-CM | POA: Diagnosis not present

## 2020-10-29 DIAGNOSIS — Z992 Dependence on renal dialysis: Secondary | ICD-10-CM | POA: Diagnosis not present

## 2020-10-29 DIAGNOSIS — N2581 Secondary hyperparathyroidism of renal origin: Secondary | ICD-10-CM | POA: Diagnosis not present

## 2020-10-29 DIAGNOSIS — D631 Anemia in chronic kidney disease: Secondary | ICD-10-CM | POA: Diagnosis not present

## 2020-10-29 DIAGNOSIS — N186 End stage renal disease: Secondary | ICD-10-CM | POA: Diagnosis not present

## 2020-10-29 DIAGNOSIS — D509 Iron deficiency anemia, unspecified: Secondary | ICD-10-CM | POA: Diagnosis not present

## 2020-10-30 DIAGNOSIS — N186 End stage renal disease: Secondary | ICD-10-CM | POA: Diagnosis not present

## 2020-10-30 DIAGNOSIS — D631 Anemia in chronic kidney disease: Secondary | ICD-10-CM | POA: Diagnosis not present

## 2020-10-30 DIAGNOSIS — Z992 Dependence on renal dialysis: Secondary | ICD-10-CM | POA: Diagnosis not present

## 2020-10-30 DIAGNOSIS — N2581 Secondary hyperparathyroidism of renal origin: Secondary | ICD-10-CM | POA: Diagnosis not present

## 2020-10-30 DIAGNOSIS — D509 Iron deficiency anemia, unspecified: Secondary | ICD-10-CM | POA: Diagnosis not present

## 2020-10-31 DIAGNOSIS — D509 Iron deficiency anemia, unspecified: Secondary | ICD-10-CM | POA: Diagnosis not present

## 2020-10-31 DIAGNOSIS — N2581 Secondary hyperparathyroidism of renal origin: Secondary | ICD-10-CM | POA: Diagnosis not present

## 2020-10-31 DIAGNOSIS — D631 Anemia in chronic kidney disease: Secondary | ICD-10-CM | POA: Diagnosis not present

## 2020-10-31 DIAGNOSIS — N186 End stage renal disease: Secondary | ICD-10-CM | POA: Diagnosis not present

## 2020-10-31 DIAGNOSIS — D508 Other iron deficiency anemias: Secondary | ICD-10-CM | POA: Diagnosis not present

## 2020-10-31 DIAGNOSIS — N401 Enlarged prostate with lower urinary tract symptoms: Secondary | ICD-10-CM | POA: Diagnosis not present

## 2020-10-31 DIAGNOSIS — I10 Essential (primary) hypertension: Secondary | ICD-10-CM | POA: Diagnosis not present

## 2020-10-31 DIAGNOSIS — N185 Chronic kidney disease, stage 5: Secondary | ICD-10-CM | POA: Diagnosis not present

## 2020-10-31 DIAGNOSIS — Z992 Dependence on renal dialysis: Secondary | ICD-10-CM | POA: Diagnosis not present

## 2020-10-31 DIAGNOSIS — K219 Gastro-esophageal reflux disease without esophagitis: Secondary | ICD-10-CM | POA: Diagnosis not present

## 2020-11-01 DIAGNOSIS — N186 End stage renal disease: Secondary | ICD-10-CM | POA: Diagnosis not present

## 2020-11-01 DIAGNOSIS — N2581 Secondary hyperparathyroidism of renal origin: Secondary | ICD-10-CM | POA: Diagnosis not present

## 2020-11-01 DIAGNOSIS — Z992 Dependence on renal dialysis: Secondary | ICD-10-CM | POA: Diagnosis not present

## 2020-11-01 DIAGNOSIS — D631 Anemia in chronic kidney disease: Secondary | ICD-10-CM | POA: Diagnosis not present

## 2020-11-01 DIAGNOSIS — D509 Iron deficiency anemia, unspecified: Secondary | ICD-10-CM | POA: Diagnosis not present

## 2020-11-02 DIAGNOSIS — D509 Iron deficiency anemia, unspecified: Secondary | ICD-10-CM | POA: Diagnosis not present

## 2020-11-02 DIAGNOSIS — Z992 Dependence on renal dialysis: Secondary | ICD-10-CM | POA: Diagnosis not present

## 2020-11-02 DIAGNOSIS — N186 End stage renal disease: Secondary | ICD-10-CM | POA: Diagnosis not present

## 2020-11-02 DIAGNOSIS — D631 Anemia in chronic kidney disease: Secondary | ICD-10-CM | POA: Diagnosis not present

## 2020-11-02 DIAGNOSIS — N2581 Secondary hyperparathyroidism of renal origin: Secondary | ICD-10-CM | POA: Diagnosis not present

## 2020-11-03 DIAGNOSIS — D631 Anemia in chronic kidney disease: Secondary | ICD-10-CM | POA: Diagnosis not present

## 2020-11-03 DIAGNOSIS — N186 End stage renal disease: Secondary | ICD-10-CM | POA: Diagnosis not present

## 2020-11-03 DIAGNOSIS — Z992 Dependence on renal dialysis: Secondary | ICD-10-CM | POA: Diagnosis not present

## 2020-11-03 DIAGNOSIS — N2581 Secondary hyperparathyroidism of renal origin: Secondary | ICD-10-CM | POA: Diagnosis not present

## 2020-11-03 DIAGNOSIS — D509 Iron deficiency anemia, unspecified: Secondary | ICD-10-CM | POA: Diagnosis not present

## 2020-11-04 DIAGNOSIS — D509 Iron deficiency anemia, unspecified: Secondary | ICD-10-CM | POA: Diagnosis not present

## 2020-11-04 DIAGNOSIS — Z992 Dependence on renal dialysis: Secondary | ICD-10-CM | POA: Diagnosis not present

## 2020-11-04 DIAGNOSIS — D631 Anemia in chronic kidney disease: Secondary | ICD-10-CM | POA: Diagnosis not present

## 2020-11-04 DIAGNOSIS — N186 End stage renal disease: Secondary | ICD-10-CM | POA: Diagnosis not present

## 2020-11-04 DIAGNOSIS — N2581 Secondary hyperparathyroidism of renal origin: Secondary | ICD-10-CM | POA: Diagnosis not present

## 2020-11-05 DIAGNOSIS — N186 End stage renal disease: Secondary | ICD-10-CM | POA: Diagnosis not present

## 2020-11-05 DIAGNOSIS — D631 Anemia in chronic kidney disease: Secondary | ICD-10-CM | POA: Diagnosis not present

## 2020-11-05 DIAGNOSIS — D509 Iron deficiency anemia, unspecified: Secondary | ICD-10-CM | POA: Diagnosis not present

## 2020-11-05 DIAGNOSIS — Z992 Dependence on renal dialysis: Secondary | ICD-10-CM | POA: Diagnosis not present

## 2020-11-05 DIAGNOSIS — N2581 Secondary hyperparathyroidism of renal origin: Secondary | ICD-10-CM | POA: Diagnosis not present

## 2020-11-06 DIAGNOSIS — D631 Anemia in chronic kidney disease: Secondary | ICD-10-CM | POA: Diagnosis not present

## 2020-11-06 DIAGNOSIS — N186 End stage renal disease: Secondary | ICD-10-CM | POA: Diagnosis not present

## 2020-11-06 DIAGNOSIS — Z992 Dependence on renal dialysis: Secondary | ICD-10-CM | POA: Diagnosis not present

## 2020-11-06 DIAGNOSIS — N2581 Secondary hyperparathyroidism of renal origin: Secondary | ICD-10-CM | POA: Diagnosis not present

## 2020-11-06 DIAGNOSIS — D509 Iron deficiency anemia, unspecified: Secondary | ICD-10-CM | POA: Diagnosis not present

## 2020-11-07 DIAGNOSIS — D509 Iron deficiency anemia, unspecified: Secondary | ICD-10-CM | POA: Diagnosis not present

## 2020-11-07 DIAGNOSIS — N2581 Secondary hyperparathyroidism of renal origin: Secondary | ICD-10-CM | POA: Diagnosis not present

## 2020-11-07 DIAGNOSIS — D631 Anemia in chronic kidney disease: Secondary | ICD-10-CM | POA: Diagnosis not present

## 2020-11-07 DIAGNOSIS — Z992 Dependence on renal dialysis: Secondary | ICD-10-CM | POA: Diagnosis not present

## 2020-11-07 DIAGNOSIS — N186 End stage renal disease: Secondary | ICD-10-CM | POA: Diagnosis not present

## 2020-11-08 DIAGNOSIS — D631 Anemia in chronic kidney disease: Secondary | ICD-10-CM | POA: Diagnosis not present

## 2020-11-08 DIAGNOSIS — Z992 Dependence on renal dialysis: Secondary | ICD-10-CM | POA: Diagnosis not present

## 2020-11-08 DIAGNOSIS — N2581 Secondary hyperparathyroidism of renal origin: Secondary | ICD-10-CM | POA: Diagnosis not present

## 2020-11-08 DIAGNOSIS — D509 Iron deficiency anemia, unspecified: Secondary | ICD-10-CM | POA: Diagnosis not present

## 2020-11-08 DIAGNOSIS — N186 End stage renal disease: Secondary | ICD-10-CM | POA: Diagnosis not present

## 2020-11-09 DIAGNOSIS — D509 Iron deficiency anemia, unspecified: Secondary | ICD-10-CM | POA: Diagnosis not present

## 2020-11-09 DIAGNOSIS — N186 End stage renal disease: Secondary | ICD-10-CM | POA: Diagnosis not present

## 2020-11-09 DIAGNOSIS — D631 Anemia in chronic kidney disease: Secondary | ICD-10-CM | POA: Diagnosis not present

## 2020-11-09 DIAGNOSIS — Z992 Dependence on renal dialysis: Secondary | ICD-10-CM | POA: Diagnosis not present

## 2020-11-09 DIAGNOSIS — N2581 Secondary hyperparathyroidism of renal origin: Secondary | ICD-10-CM | POA: Diagnosis not present

## 2020-11-10 DIAGNOSIS — D509 Iron deficiency anemia, unspecified: Secondary | ICD-10-CM | POA: Diagnosis not present

## 2020-11-10 DIAGNOSIS — D631 Anemia in chronic kidney disease: Secondary | ICD-10-CM | POA: Diagnosis not present

## 2020-11-10 DIAGNOSIS — N186 End stage renal disease: Secondary | ICD-10-CM | POA: Diagnosis not present

## 2020-11-10 DIAGNOSIS — N2581 Secondary hyperparathyroidism of renal origin: Secondary | ICD-10-CM | POA: Diagnosis not present

## 2020-11-10 DIAGNOSIS — Z992 Dependence on renal dialysis: Secondary | ICD-10-CM | POA: Diagnosis not present

## 2020-11-11 DIAGNOSIS — D631 Anemia in chronic kidney disease: Secondary | ICD-10-CM | POA: Diagnosis not present

## 2020-11-11 DIAGNOSIS — N186 End stage renal disease: Secondary | ICD-10-CM | POA: Diagnosis not present

## 2020-11-11 DIAGNOSIS — D509 Iron deficiency anemia, unspecified: Secondary | ICD-10-CM | POA: Diagnosis not present

## 2020-11-11 DIAGNOSIS — N2581 Secondary hyperparathyroidism of renal origin: Secondary | ICD-10-CM | POA: Diagnosis not present

## 2020-11-11 DIAGNOSIS — Z992 Dependence on renal dialysis: Secondary | ICD-10-CM | POA: Diagnosis not present

## 2020-11-12 DIAGNOSIS — N186 End stage renal disease: Secondary | ICD-10-CM | POA: Diagnosis not present

## 2020-11-12 DIAGNOSIS — Z992 Dependence on renal dialysis: Secondary | ICD-10-CM | POA: Diagnosis not present

## 2020-11-12 DIAGNOSIS — N2581 Secondary hyperparathyroidism of renal origin: Secondary | ICD-10-CM | POA: Diagnosis not present

## 2020-11-12 DIAGNOSIS — D631 Anemia in chronic kidney disease: Secondary | ICD-10-CM | POA: Diagnosis not present

## 2020-11-12 DIAGNOSIS — D509 Iron deficiency anemia, unspecified: Secondary | ICD-10-CM | POA: Diagnosis not present

## 2020-11-13 DIAGNOSIS — Z992 Dependence on renal dialysis: Secondary | ICD-10-CM | POA: Diagnosis not present

## 2020-11-13 DIAGNOSIS — N2581 Secondary hyperparathyroidism of renal origin: Secondary | ICD-10-CM | POA: Diagnosis not present

## 2020-11-13 DIAGNOSIS — D631 Anemia in chronic kidney disease: Secondary | ICD-10-CM | POA: Diagnosis not present

## 2020-11-13 DIAGNOSIS — N186 End stage renal disease: Secondary | ICD-10-CM | POA: Diagnosis not present

## 2020-11-13 DIAGNOSIS — D509 Iron deficiency anemia, unspecified: Secondary | ICD-10-CM | POA: Diagnosis not present

## 2020-11-14 DIAGNOSIS — N186 End stage renal disease: Secondary | ICD-10-CM | POA: Diagnosis not present

## 2020-11-14 DIAGNOSIS — Z992 Dependence on renal dialysis: Secondary | ICD-10-CM | POA: Diagnosis not present

## 2020-11-14 DIAGNOSIS — D631 Anemia in chronic kidney disease: Secondary | ICD-10-CM | POA: Diagnosis not present

## 2020-11-14 DIAGNOSIS — D509 Iron deficiency anemia, unspecified: Secondary | ICD-10-CM | POA: Diagnosis not present

## 2020-11-14 DIAGNOSIS — N2581 Secondary hyperparathyroidism of renal origin: Secondary | ICD-10-CM | POA: Diagnosis not present

## 2020-11-15 DIAGNOSIS — N2581 Secondary hyperparathyroidism of renal origin: Secondary | ICD-10-CM | POA: Diagnosis not present

## 2020-11-15 DIAGNOSIS — D509 Iron deficiency anemia, unspecified: Secondary | ICD-10-CM | POA: Diagnosis not present

## 2020-11-15 DIAGNOSIS — Z992 Dependence on renal dialysis: Secondary | ICD-10-CM | POA: Diagnosis not present

## 2020-11-15 DIAGNOSIS — D631 Anemia in chronic kidney disease: Secondary | ICD-10-CM | POA: Diagnosis not present

## 2020-11-15 DIAGNOSIS — N186 End stage renal disease: Secondary | ICD-10-CM | POA: Diagnosis not present

## 2020-11-16 DIAGNOSIS — Z992 Dependence on renal dialysis: Secondary | ICD-10-CM | POA: Diagnosis not present

## 2020-11-16 DIAGNOSIS — D509 Iron deficiency anemia, unspecified: Secondary | ICD-10-CM | POA: Diagnosis not present

## 2020-11-16 DIAGNOSIS — N2581 Secondary hyperparathyroidism of renal origin: Secondary | ICD-10-CM | POA: Diagnosis not present

## 2020-11-16 DIAGNOSIS — D631 Anemia in chronic kidney disease: Secondary | ICD-10-CM | POA: Diagnosis not present

## 2020-11-16 DIAGNOSIS — N186 End stage renal disease: Secondary | ICD-10-CM | POA: Diagnosis not present

## 2020-11-17 DIAGNOSIS — N186 End stage renal disease: Secondary | ICD-10-CM | POA: Diagnosis not present

## 2020-11-17 DIAGNOSIS — Z992 Dependence on renal dialysis: Secondary | ICD-10-CM | POA: Diagnosis not present

## 2020-11-17 DIAGNOSIS — N2581 Secondary hyperparathyroidism of renal origin: Secondary | ICD-10-CM | POA: Diagnosis not present

## 2020-11-17 DIAGNOSIS — D631 Anemia in chronic kidney disease: Secondary | ICD-10-CM | POA: Diagnosis not present

## 2020-11-17 DIAGNOSIS — D509 Iron deficiency anemia, unspecified: Secondary | ICD-10-CM | POA: Diagnosis not present

## 2020-11-18 DIAGNOSIS — D509 Iron deficiency anemia, unspecified: Secondary | ICD-10-CM | POA: Diagnosis not present

## 2020-11-18 DIAGNOSIS — N186 End stage renal disease: Secondary | ICD-10-CM | POA: Diagnosis not present

## 2020-11-18 DIAGNOSIS — Z992 Dependence on renal dialysis: Secondary | ICD-10-CM | POA: Diagnosis not present

## 2020-11-18 DIAGNOSIS — D631 Anemia in chronic kidney disease: Secondary | ICD-10-CM | POA: Diagnosis not present

## 2020-11-18 DIAGNOSIS — N2581 Secondary hyperparathyroidism of renal origin: Secondary | ICD-10-CM | POA: Diagnosis not present

## 2020-11-19 DIAGNOSIS — N186 End stage renal disease: Secondary | ICD-10-CM | POA: Diagnosis not present

## 2020-11-19 DIAGNOSIS — D509 Iron deficiency anemia, unspecified: Secondary | ICD-10-CM | POA: Diagnosis not present

## 2020-11-19 DIAGNOSIS — Z992 Dependence on renal dialysis: Secondary | ICD-10-CM | POA: Diagnosis not present

## 2020-11-19 DIAGNOSIS — N2581 Secondary hyperparathyroidism of renal origin: Secondary | ICD-10-CM | POA: Diagnosis not present

## 2020-11-19 DIAGNOSIS — D631 Anemia in chronic kidney disease: Secondary | ICD-10-CM | POA: Diagnosis not present

## 2020-11-20 DIAGNOSIS — Z992 Dependence on renal dialysis: Secondary | ICD-10-CM | POA: Diagnosis not present

## 2020-11-20 DIAGNOSIS — D509 Iron deficiency anemia, unspecified: Secondary | ICD-10-CM | POA: Diagnosis not present

## 2020-11-20 DIAGNOSIS — N2581 Secondary hyperparathyroidism of renal origin: Secondary | ICD-10-CM | POA: Diagnosis not present

## 2020-11-20 DIAGNOSIS — N186 End stage renal disease: Secondary | ICD-10-CM | POA: Diagnosis not present

## 2020-11-20 DIAGNOSIS — D631 Anemia in chronic kidney disease: Secondary | ICD-10-CM | POA: Diagnosis not present

## 2020-11-21 DIAGNOSIS — Z992 Dependence on renal dialysis: Secondary | ICD-10-CM | POA: Diagnosis not present

## 2020-11-21 DIAGNOSIS — N186 End stage renal disease: Secondary | ICD-10-CM | POA: Diagnosis not present

## 2020-11-21 DIAGNOSIS — D509 Iron deficiency anemia, unspecified: Secondary | ICD-10-CM | POA: Diagnosis not present

## 2020-11-21 DIAGNOSIS — N2581 Secondary hyperparathyroidism of renal origin: Secondary | ICD-10-CM | POA: Diagnosis not present

## 2020-11-21 DIAGNOSIS — D631 Anemia in chronic kidney disease: Secondary | ICD-10-CM | POA: Diagnosis not present

## 2020-11-22 DIAGNOSIS — Z992 Dependence on renal dialysis: Secondary | ICD-10-CM | POA: Diagnosis not present

## 2020-11-22 DIAGNOSIS — D509 Iron deficiency anemia, unspecified: Secondary | ICD-10-CM | POA: Diagnosis not present

## 2020-11-22 DIAGNOSIS — N2581 Secondary hyperparathyroidism of renal origin: Secondary | ICD-10-CM | POA: Diagnosis not present

## 2020-11-22 DIAGNOSIS — N186 End stage renal disease: Secondary | ICD-10-CM | POA: Diagnosis not present

## 2020-11-22 DIAGNOSIS — D631 Anemia in chronic kidney disease: Secondary | ICD-10-CM | POA: Diagnosis not present

## 2020-11-23 DIAGNOSIS — D509 Iron deficiency anemia, unspecified: Secondary | ICD-10-CM | POA: Diagnosis not present

## 2020-11-23 DIAGNOSIS — D631 Anemia in chronic kidney disease: Secondary | ICD-10-CM | POA: Diagnosis not present

## 2020-11-23 DIAGNOSIS — Z992 Dependence on renal dialysis: Secondary | ICD-10-CM | POA: Diagnosis not present

## 2020-11-23 DIAGNOSIS — N186 End stage renal disease: Secondary | ICD-10-CM | POA: Diagnosis not present

## 2020-11-23 DIAGNOSIS — N2581 Secondary hyperparathyroidism of renal origin: Secondary | ICD-10-CM | POA: Diagnosis not present

## 2020-11-24 DIAGNOSIS — N186 End stage renal disease: Secondary | ICD-10-CM | POA: Diagnosis not present

## 2020-11-24 DIAGNOSIS — N2581 Secondary hyperparathyroidism of renal origin: Secondary | ICD-10-CM | POA: Diagnosis not present

## 2020-11-24 DIAGNOSIS — D509 Iron deficiency anemia, unspecified: Secondary | ICD-10-CM | POA: Diagnosis not present

## 2020-11-24 DIAGNOSIS — D631 Anemia in chronic kidney disease: Secondary | ICD-10-CM | POA: Diagnosis not present

## 2020-11-24 DIAGNOSIS — Z992 Dependence on renal dialysis: Secondary | ICD-10-CM | POA: Diagnosis not present

## 2020-11-25 DIAGNOSIS — D509 Iron deficiency anemia, unspecified: Secondary | ICD-10-CM | POA: Diagnosis not present

## 2020-11-25 DIAGNOSIS — D631 Anemia in chronic kidney disease: Secondary | ICD-10-CM | POA: Diagnosis not present

## 2020-11-25 DIAGNOSIS — N186 End stage renal disease: Secondary | ICD-10-CM | POA: Diagnosis not present

## 2020-11-25 DIAGNOSIS — N2581 Secondary hyperparathyroidism of renal origin: Secondary | ICD-10-CM | POA: Diagnosis not present

## 2020-11-25 DIAGNOSIS — Z992 Dependence on renal dialysis: Secondary | ICD-10-CM | POA: Diagnosis not present

## 2020-11-26 DIAGNOSIS — D631 Anemia in chronic kidney disease: Secondary | ICD-10-CM | POA: Diagnosis not present

## 2020-11-26 DIAGNOSIS — D509 Iron deficiency anemia, unspecified: Secondary | ICD-10-CM | POA: Diagnosis not present

## 2020-11-26 DIAGNOSIS — N186 End stage renal disease: Secondary | ICD-10-CM | POA: Diagnosis not present

## 2020-11-26 DIAGNOSIS — Z992 Dependence on renal dialysis: Secondary | ICD-10-CM | POA: Diagnosis not present

## 2020-11-26 DIAGNOSIS — N2581 Secondary hyperparathyroidism of renal origin: Secondary | ICD-10-CM | POA: Diagnosis not present

## 2020-11-27 DIAGNOSIS — D631 Anemia in chronic kidney disease: Secondary | ICD-10-CM | POA: Diagnosis not present

## 2020-11-27 DIAGNOSIS — N2581 Secondary hyperparathyroidism of renal origin: Secondary | ICD-10-CM | POA: Diagnosis not present

## 2020-11-27 DIAGNOSIS — D509 Iron deficiency anemia, unspecified: Secondary | ICD-10-CM | POA: Diagnosis not present

## 2020-11-27 DIAGNOSIS — Z992 Dependence on renal dialysis: Secondary | ICD-10-CM | POA: Diagnosis not present

## 2020-11-27 DIAGNOSIS — N186 End stage renal disease: Secondary | ICD-10-CM | POA: Diagnosis not present

## 2020-11-28 DIAGNOSIS — N186 End stage renal disease: Secondary | ICD-10-CM | POA: Diagnosis not present

## 2020-11-28 DIAGNOSIS — D631 Anemia in chronic kidney disease: Secondary | ICD-10-CM | POA: Diagnosis not present

## 2020-11-28 DIAGNOSIS — N2581 Secondary hyperparathyroidism of renal origin: Secondary | ICD-10-CM | POA: Diagnosis not present

## 2020-11-28 DIAGNOSIS — D509 Iron deficiency anemia, unspecified: Secondary | ICD-10-CM | POA: Diagnosis not present

## 2020-11-28 DIAGNOSIS — Z992 Dependence on renal dialysis: Secondary | ICD-10-CM | POA: Diagnosis not present

## 2020-11-29 DIAGNOSIS — D509 Iron deficiency anemia, unspecified: Secondary | ICD-10-CM | POA: Diagnosis not present

## 2020-11-29 DIAGNOSIS — N2581 Secondary hyperparathyroidism of renal origin: Secondary | ICD-10-CM | POA: Diagnosis not present

## 2020-11-29 DIAGNOSIS — D631 Anemia in chronic kidney disease: Secondary | ICD-10-CM | POA: Diagnosis not present

## 2020-11-29 DIAGNOSIS — N186 End stage renal disease: Secondary | ICD-10-CM | POA: Diagnosis not present

## 2020-11-29 DIAGNOSIS — Z992 Dependence on renal dialysis: Secondary | ICD-10-CM | POA: Diagnosis not present

## 2020-11-30 DIAGNOSIS — Z992 Dependence on renal dialysis: Secondary | ICD-10-CM | POA: Diagnosis not present

## 2020-11-30 DIAGNOSIS — N2581 Secondary hyperparathyroidism of renal origin: Secondary | ICD-10-CM | POA: Diagnosis not present

## 2020-11-30 DIAGNOSIS — D509 Iron deficiency anemia, unspecified: Secondary | ICD-10-CM | POA: Diagnosis not present

## 2020-11-30 DIAGNOSIS — N186 End stage renal disease: Secondary | ICD-10-CM | POA: Diagnosis not present

## 2020-11-30 DIAGNOSIS — D631 Anemia in chronic kidney disease: Secondary | ICD-10-CM | POA: Diagnosis not present

## 2020-12-01 DIAGNOSIS — N2581 Secondary hyperparathyroidism of renal origin: Secondary | ICD-10-CM | POA: Diagnosis not present

## 2020-12-01 DIAGNOSIS — D509 Iron deficiency anemia, unspecified: Secondary | ICD-10-CM | POA: Diagnosis not present

## 2020-12-01 DIAGNOSIS — N186 End stage renal disease: Secondary | ICD-10-CM | POA: Diagnosis not present

## 2020-12-01 DIAGNOSIS — D631 Anemia in chronic kidney disease: Secondary | ICD-10-CM | POA: Diagnosis not present

## 2020-12-01 DIAGNOSIS — Z992 Dependence on renal dialysis: Secondary | ICD-10-CM | POA: Diagnosis not present

## 2020-12-02 DIAGNOSIS — Z992 Dependence on renal dialysis: Secondary | ICD-10-CM | POA: Diagnosis not present

## 2020-12-02 DIAGNOSIS — D631 Anemia in chronic kidney disease: Secondary | ICD-10-CM | POA: Diagnosis not present

## 2020-12-02 DIAGNOSIS — N186 End stage renal disease: Secondary | ICD-10-CM | POA: Diagnosis not present

## 2020-12-02 DIAGNOSIS — D509 Iron deficiency anemia, unspecified: Secondary | ICD-10-CM | POA: Diagnosis not present

## 2020-12-02 DIAGNOSIS — N2581 Secondary hyperparathyroidism of renal origin: Secondary | ICD-10-CM | POA: Diagnosis not present

## 2020-12-03 DIAGNOSIS — N2581 Secondary hyperparathyroidism of renal origin: Secondary | ICD-10-CM | POA: Diagnosis not present

## 2020-12-03 DIAGNOSIS — N186 End stage renal disease: Secondary | ICD-10-CM | POA: Diagnosis not present

## 2020-12-03 DIAGNOSIS — Z992 Dependence on renal dialysis: Secondary | ICD-10-CM | POA: Diagnosis not present

## 2020-12-03 DIAGNOSIS — D509 Iron deficiency anemia, unspecified: Secondary | ICD-10-CM | POA: Diagnosis not present

## 2020-12-03 DIAGNOSIS — D631 Anemia in chronic kidney disease: Secondary | ICD-10-CM | POA: Diagnosis not present

## 2020-12-04 DIAGNOSIS — Z992 Dependence on renal dialysis: Secondary | ICD-10-CM | POA: Diagnosis not present

## 2020-12-04 DIAGNOSIS — D631 Anemia in chronic kidney disease: Secondary | ICD-10-CM | POA: Diagnosis not present

## 2020-12-04 DIAGNOSIS — D509 Iron deficiency anemia, unspecified: Secondary | ICD-10-CM | POA: Diagnosis not present

## 2020-12-04 DIAGNOSIS — N186 End stage renal disease: Secondary | ICD-10-CM | POA: Diagnosis not present

## 2020-12-04 DIAGNOSIS — N2581 Secondary hyperparathyroidism of renal origin: Secondary | ICD-10-CM | POA: Diagnosis not present

## 2020-12-05 DIAGNOSIS — Z992 Dependence on renal dialysis: Secondary | ICD-10-CM | POA: Diagnosis not present

## 2020-12-05 DIAGNOSIS — D631 Anemia in chronic kidney disease: Secondary | ICD-10-CM | POA: Diagnosis not present

## 2020-12-05 DIAGNOSIS — N2581 Secondary hyperparathyroidism of renal origin: Secondary | ICD-10-CM | POA: Diagnosis not present

## 2020-12-05 DIAGNOSIS — D509 Iron deficiency anemia, unspecified: Secondary | ICD-10-CM | POA: Diagnosis not present

## 2020-12-05 DIAGNOSIS — N186 End stage renal disease: Secondary | ICD-10-CM | POA: Diagnosis not present

## 2020-12-06 DIAGNOSIS — N2581 Secondary hyperparathyroidism of renal origin: Secondary | ICD-10-CM | POA: Diagnosis not present

## 2020-12-06 DIAGNOSIS — D631 Anemia in chronic kidney disease: Secondary | ICD-10-CM | POA: Diagnosis not present

## 2020-12-06 DIAGNOSIS — N186 End stage renal disease: Secondary | ICD-10-CM | POA: Diagnosis not present

## 2020-12-06 DIAGNOSIS — Z992 Dependence on renal dialysis: Secondary | ICD-10-CM | POA: Diagnosis not present

## 2020-12-06 DIAGNOSIS — D509 Iron deficiency anemia, unspecified: Secondary | ICD-10-CM | POA: Diagnosis not present

## 2020-12-07 DIAGNOSIS — Z992 Dependence on renal dialysis: Secondary | ICD-10-CM | POA: Diagnosis not present

## 2020-12-07 DIAGNOSIS — N186 End stage renal disease: Secondary | ICD-10-CM | POA: Diagnosis not present

## 2020-12-07 DIAGNOSIS — N2581 Secondary hyperparathyroidism of renal origin: Secondary | ICD-10-CM | POA: Diagnosis not present

## 2020-12-07 DIAGNOSIS — D631 Anemia in chronic kidney disease: Secondary | ICD-10-CM | POA: Diagnosis not present

## 2020-12-07 DIAGNOSIS — D509 Iron deficiency anemia, unspecified: Secondary | ICD-10-CM | POA: Diagnosis not present

## 2020-12-08 DIAGNOSIS — D631 Anemia in chronic kidney disease: Secondary | ICD-10-CM | POA: Diagnosis not present

## 2020-12-08 DIAGNOSIS — N186 End stage renal disease: Secondary | ICD-10-CM | POA: Diagnosis not present

## 2020-12-08 DIAGNOSIS — D509 Iron deficiency anemia, unspecified: Secondary | ICD-10-CM | POA: Diagnosis not present

## 2020-12-08 DIAGNOSIS — Z992 Dependence on renal dialysis: Secondary | ICD-10-CM | POA: Diagnosis not present

## 2020-12-08 DIAGNOSIS — N2581 Secondary hyperparathyroidism of renal origin: Secondary | ICD-10-CM | POA: Diagnosis not present

## 2020-12-09 DIAGNOSIS — D631 Anemia in chronic kidney disease: Secondary | ICD-10-CM | POA: Diagnosis not present

## 2020-12-09 DIAGNOSIS — Z992 Dependence on renal dialysis: Secondary | ICD-10-CM | POA: Diagnosis not present

## 2020-12-09 DIAGNOSIS — D509 Iron deficiency anemia, unspecified: Secondary | ICD-10-CM | POA: Diagnosis not present

## 2020-12-09 DIAGNOSIS — N2581 Secondary hyperparathyroidism of renal origin: Secondary | ICD-10-CM | POA: Diagnosis not present

## 2020-12-09 DIAGNOSIS — N186 End stage renal disease: Secondary | ICD-10-CM | POA: Diagnosis not present

## 2020-12-10 DIAGNOSIS — D631 Anemia in chronic kidney disease: Secondary | ICD-10-CM | POA: Diagnosis not present

## 2020-12-10 DIAGNOSIS — N186 End stage renal disease: Secondary | ICD-10-CM | POA: Diagnosis not present

## 2020-12-10 DIAGNOSIS — N2581 Secondary hyperparathyroidism of renal origin: Secondary | ICD-10-CM | POA: Diagnosis not present

## 2020-12-10 DIAGNOSIS — Z992 Dependence on renal dialysis: Secondary | ICD-10-CM | POA: Diagnosis not present

## 2020-12-10 DIAGNOSIS — D509 Iron deficiency anemia, unspecified: Secondary | ICD-10-CM | POA: Diagnosis not present

## 2020-12-11 DIAGNOSIS — D631 Anemia in chronic kidney disease: Secondary | ICD-10-CM | POA: Diagnosis not present

## 2020-12-11 DIAGNOSIS — Z992 Dependence on renal dialysis: Secondary | ICD-10-CM | POA: Diagnosis not present

## 2020-12-11 DIAGNOSIS — N2581 Secondary hyperparathyroidism of renal origin: Secondary | ICD-10-CM | POA: Diagnosis not present

## 2020-12-11 DIAGNOSIS — D509 Iron deficiency anemia, unspecified: Secondary | ICD-10-CM | POA: Diagnosis not present

## 2020-12-11 DIAGNOSIS — N186 End stage renal disease: Secondary | ICD-10-CM | POA: Diagnosis not present

## 2020-12-12 DIAGNOSIS — N186 End stage renal disease: Secondary | ICD-10-CM | POA: Diagnosis not present

## 2020-12-12 DIAGNOSIS — N2581 Secondary hyperparathyroidism of renal origin: Secondary | ICD-10-CM | POA: Diagnosis not present

## 2020-12-12 DIAGNOSIS — D509 Iron deficiency anemia, unspecified: Secondary | ICD-10-CM | POA: Diagnosis not present

## 2020-12-12 DIAGNOSIS — Z992 Dependence on renal dialysis: Secondary | ICD-10-CM | POA: Diagnosis not present

## 2020-12-12 DIAGNOSIS — D631 Anemia in chronic kidney disease: Secondary | ICD-10-CM | POA: Diagnosis not present

## 2020-12-13 DIAGNOSIS — D631 Anemia in chronic kidney disease: Secondary | ICD-10-CM | POA: Diagnosis not present

## 2020-12-13 DIAGNOSIS — N2581 Secondary hyperparathyroidism of renal origin: Secondary | ICD-10-CM | POA: Diagnosis not present

## 2020-12-13 DIAGNOSIS — D509 Iron deficiency anemia, unspecified: Secondary | ICD-10-CM | POA: Diagnosis not present

## 2020-12-13 DIAGNOSIS — Z992 Dependence on renal dialysis: Secondary | ICD-10-CM | POA: Diagnosis not present

## 2020-12-13 DIAGNOSIS — N186 End stage renal disease: Secondary | ICD-10-CM | POA: Diagnosis not present

## 2020-12-14 DIAGNOSIS — D509 Iron deficiency anemia, unspecified: Secondary | ICD-10-CM | POA: Diagnosis not present

## 2020-12-14 DIAGNOSIS — N186 End stage renal disease: Secondary | ICD-10-CM | POA: Diagnosis not present

## 2020-12-14 DIAGNOSIS — N2581 Secondary hyperparathyroidism of renal origin: Secondary | ICD-10-CM | POA: Diagnosis not present

## 2020-12-14 DIAGNOSIS — D631 Anemia in chronic kidney disease: Secondary | ICD-10-CM | POA: Diagnosis not present

## 2020-12-14 DIAGNOSIS — Z992 Dependence on renal dialysis: Secondary | ICD-10-CM | POA: Diagnosis not present

## 2020-12-15 DIAGNOSIS — N2581 Secondary hyperparathyroidism of renal origin: Secondary | ICD-10-CM | POA: Diagnosis not present

## 2020-12-15 DIAGNOSIS — N186 End stage renal disease: Secondary | ICD-10-CM | POA: Diagnosis not present

## 2020-12-15 DIAGNOSIS — Z992 Dependence on renal dialysis: Secondary | ICD-10-CM | POA: Diagnosis not present

## 2020-12-15 DIAGNOSIS — D631 Anemia in chronic kidney disease: Secondary | ICD-10-CM | POA: Diagnosis not present

## 2020-12-15 DIAGNOSIS — D509 Iron deficiency anemia, unspecified: Secondary | ICD-10-CM | POA: Diagnosis not present

## 2020-12-16 DIAGNOSIS — D631 Anemia in chronic kidney disease: Secondary | ICD-10-CM | POA: Diagnosis not present

## 2020-12-16 DIAGNOSIS — N2581 Secondary hyperparathyroidism of renal origin: Secondary | ICD-10-CM | POA: Diagnosis not present

## 2020-12-16 DIAGNOSIS — D509 Iron deficiency anemia, unspecified: Secondary | ICD-10-CM | POA: Diagnosis not present

## 2020-12-16 DIAGNOSIS — N186 End stage renal disease: Secondary | ICD-10-CM | POA: Diagnosis not present

## 2020-12-16 DIAGNOSIS — Z992 Dependence on renal dialysis: Secondary | ICD-10-CM | POA: Diagnosis not present

## 2020-12-17 DIAGNOSIS — D631 Anemia in chronic kidney disease: Secondary | ICD-10-CM | POA: Diagnosis not present

## 2020-12-17 DIAGNOSIS — N186 End stage renal disease: Secondary | ICD-10-CM | POA: Diagnosis not present

## 2020-12-17 DIAGNOSIS — D509 Iron deficiency anemia, unspecified: Secondary | ICD-10-CM | POA: Diagnosis not present

## 2020-12-17 DIAGNOSIS — N2581 Secondary hyperparathyroidism of renal origin: Secondary | ICD-10-CM | POA: Diagnosis not present

## 2020-12-17 DIAGNOSIS — Z992 Dependence on renal dialysis: Secondary | ICD-10-CM | POA: Diagnosis not present

## 2020-12-18 DIAGNOSIS — D631 Anemia in chronic kidney disease: Secondary | ICD-10-CM | POA: Diagnosis not present

## 2020-12-18 DIAGNOSIS — D509 Iron deficiency anemia, unspecified: Secondary | ICD-10-CM | POA: Diagnosis not present

## 2020-12-18 DIAGNOSIS — N2581 Secondary hyperparathyroidism of renal origin: Secondary | ICD-10-CM | POA: Diagnosis not present

## 2020-12-18 DIAGNOSIS — N186 End stage renal disease: Secondary | ICD-10-CM | POA: Diagnosis not present

## 2020-12-18 DIAGNOSIS — Z992 Dependence on renal dialysis: Secondary | ICD-10-CM | POA: Diagnosis not present

## 2020-12-19 DIAGNOSIS — D509 Iron deficiency anemia, unspecified: Secondary | ICD-10-CM | POA: Diagnosis not present

## 2020-12-19 DIAGNOSIS — N186 End stage renal disease: Secondary | ICD-10-CM | POA: Diagnosis not present

## 2020-12-19 DIAGNOSIS — Z992 Dependence on renal dialysis: Secondary | ICD-10-CM | POA: Diagnosis not present

## 2020-12-19 DIAGNOSIS — D631 Anemia in chronic kidney disease: Secondary | ICD-10-CM | POA: Diagnosis not present

## 2020-12-19 DIAGNOSIS — N2581 Secondary hyperparathyroidism of renal origin: Secondary | ICD-10-CM | POA: Diagnosis not present

## 2020-12-20 DIAGNOSIS — N2581 Secondary hyperparathyroidism of renal origin: Secondary | ICD-10-CM | POA: Diagnosis not present

## 2020-12-20 DIAGNOSIS — Z992 Dependence on renal dialysis: Secondary | ICD-10-CM | POA: Diagnosis not present

## 2020-12-20 DIAGNOSIS — D509 Iron deficiency anemia, unspecified: Secondary | ICD-10-CM | POA: Diagnosis not present

## 2020-12-20 DIAGNOSIS — N186 End stage renal disease: Secondary | ICD-10-CM | POA: Diagnosis not present

## 2020-12-21 DIAGNOSIS — N2581 Secondary hyperparathyroidism of renal origin: Secondary | ICD-10-CM | POA: Diagnosis not present

## 2020-12-21 DIAGNOSIS — Z992 Dependence on renal dialysis: Secondary | ICD-10-CM | POA: Diagnosis not present

## 2020-12-21 DIAGNOSIS — N186 End stage renal disease: Secondary | ICD-10-CM | POA: Diagnosis not present

## 2020-12-21 DIAGNOSIS — D509 Iron deficiency anemia, unspecified: Secondary | ICD-10-CM | POA: Diagnosis not present

## 2020-12-22 DIAGNOSIS — N2581 Secondary hyperparathyroidism of renal origin: Secondary | ICD-10-CM | POA: Diagnosis not present

## 2020-12-22 DIAGNOSIS — N186 End stage renal disease: Secondary | ICD-10-CM | POA: Diagnosis not present

## 2020-12-22 DIAGNOSIS — D509 Iron deficiency anemia, unspecified: Secondary | ICD-10-CM | POA: Diagnosis not present

## 2020-12-22 DIAGNOSIS — Z992 Dependence on renal dialysis: Secondary | ICD-10-CM | POA: Diagnosis not present

## 2020-12-23 DIAGNOSIS — D509 Iron deficiency anemia, unspecified: Secondary | ICD-10-CM | POA: Diagnosis not present

## 2020-12-23 DIAGNOSIS — N2581 Secondary hyperparathyroidism of renal origin: Secondary | ICD-10-CM | POA: Diagnosis not present

## 2020-12-23 DIAGNOSIS — Z992 Dependence on renal dialysis: Secondary | ICD-10-CM | POA: Diagnosis not present

## 2020-12-23 DIAGNOSIS — N186 End stage renal disease: Secondary | ICD-10-CM | POA: Diagnosis not present

## 2020-12-24 DIAGNOSIS — N186 End stage renal disease: Secondary | ICD-10-CM | POA: Diagnosis not present

## 2020-12-24 DIAGNOSIS — Z992 Dependence on renal dialysis: Secondary | ICD-10-CM | POA: Diagnosis not present

## 2020-12-24 DIAGNOSIS — N2581 Secondary hyperparathyroidism of renal origin: Secondary | ICD-10-CM | POA: Diagnosis not present

## 2020-12-24 DIAGNOSIS — D509 Iron deficiency anemia, unspecified: Secondary | ICD-10-CM | POA: Diagnosis not present

## 2020-12-25 DIAGNOSIS — D509 Iron deficiency anemia, unspecified: Secondary | ICD-10-CM | POA: Diagnosis not present

## 2020-12-25 DIAGNOSIS — Z992 Dependence on renal dialysis: Secondary | ICD-10-CM | POA: Diagnosis not present

## 2020-12-25 DIAGNOSIS — N186 End stage renal disease: Secondary | ICD-10-CM | POA: Diagnosis not present

## 2020-12-25 DIAGNOSIS — N2581 Secondary hyperparathyroidism of renal origin: Secondary | ICD-10-CM | POA: Diagnosis not present

## 2020-12-26 DIAGNOSIS — N2581 Secondary hyperparathyroidism of renal origin: Secondary | ICD-10-CM | POA: Diagnosis not present

## 2020-12-26 DIAGNOSIS — D509 Iron deficiency anemia, unspecified: Secondary | ICD-10-CM | POA: Diagnosis not present

## 2020-12-26 DIAGNOSIS — Z992 Dependence on renal dialysis: Secondary | ICD-10-CM | POA: Diagnosis not present

## 2020-12-26 DIAGNOSIS — N186 End stage renal disease: Secondary | ICD-10-CM | POA: Diagnosis not present

## 2020-12-27 DIAGNOSIS — N2581 Secondary hyperparathyroidism of renal origin: Secondary | ICD-10-CM | POA: Diagnosis not present

## 2020-12-27 DIAGNOSIS — Z992 Dependence on renal dialysis: Secondary | ICD-10-CM | POA: Diagnosis not present

## 2020-12-27 DIAGNOSIS — D509 Iron deficiency anemia, unspecified: Secondary | ICD-10-CM | POA: Diagnosis not present

## 2020-12-27 DIAGNOSIS — N186 End stage renal disease: Secondary | ICD-10-CM | POA: Diagnosis not present

## 2020-12-28 DIAGNOSIS — N2581 Secondary hyperparathyroidism of renal origin: Secondary | ICD-10-CM | POA: Diagnosis not present

## 2020-12-28 DIAGNOSIS — Z992 Dependence on renal dialysis: Secondary | ICD-10-CM | POA: Diagnosis not present

## 2020-12-28 DIAGNOSIS — D509 Iron deficiency anemia, unspecified: Secondary | ICD-10-CM | POA: Diagnosis not present

## 2020-12-28 DIAGNOSIS — N186 End stage renal disease: Secondary | ICD-10-CM | POA: Diagnosis not present

## 2020-12-29 DIAGNOSIS — Z992 Dependence on renal dialysis: Secondary | ICD-10-CM | POA: Diagnosis not present

## 2020-12-29 DIAGNOSIS — D509 Iron deficiency anemia, unspecified: Secondary | ICD-10-CM | POA: Diagnosis not present

## 2020-12-29 DIAGNOSIS — N2581 Secondary hyperparathyroidism of renal origin: Secondary | ICD-10-CM | POA: Diagnosis not present

## 2020-12-29 DIAGNOSIS — N186 End stage renal disease: Secondary | ICD-10-CM | POA: Diagnosis not present

## 2020-12-30 DIAGNOSIS — N186 End stage renal disease: Secondary | ICD-10-CM | POA: Diagnosis not present

## 2020-12-30 DIAGNOSIS — D509 Iron deficiency anemia, unspecified: Secondary | ICD-10-CM | POA: Diagnosis not present

## 2020-12-30 DIAGNOSIS — Z992 Dependence on renal dialysis: Secondary | ICD-10-CM | POA: Diagnosis not present

## 2020-12-30 DIAGNOSIS — N2581 Secondary hyperparathyroidism of renal origin: Secondary | ICD-10-CM | POA: Diagnosis not present

## 2020-12-31 DIAGNOSIS — N186 End stage renal disease: Secondary | ICD-10-CM | POA: Diagnosis not present

## 2020-12-31 DIAGNOSIS — Z992 Dependence on renal dialysis: Secondary | ICD-10-CM | POA: Diagnosis not present

## 2020-12-31 DIAGNOSIS — D509 Iron deficiency anemia, unspecified: Secondary | ICD-10-CM | POA: Diagnosis not present

## 2020-12-31 DIAGNOSIS — N2581 Secondary hyperparathyroidism of renal origin: Secondary | ICD-10-CM | POA: Diagnosis not present

## 2021-01-01 DIAGNOSIS — D509 Iron deficiency anemia, unspecified: Secondary | ICD-10-CM | POA: Diagnosis not present

## 2021-01-01 DIAGNOSIS — N186 End stage renal disease: Secondary | ICD-10-CM | POA: Diagnosis not present

## 2021-01-01 DIAGNOSIS — N2581 Secondary hyperparathyroidism of renal origin: Secondary | ICD-10-CM | POA: Diagnosis not present

## 2021-01-01 DIAGNOSIS — Z992 Dependence on renal dialysis: Secondary | ICD-10-CM | POA: Diagnosis not present

## 2021-01-02 DIAGNOSIS — N2581 Secondary hyperparathyroidism of renal origin: Secondary | ICD-10-CM | POA: Diagnosis not present

## 2021-01-02 DIAGNOSIS — D509 Iron deficiency anemia, unspecified: Secondary | ICD-10-CM | POA: Diagnosis not present

## 2021-01-02 DIAGNOSIS — N186 End stage renal disease: Secondary | ICD-10-CM | POA: Diagnosis not present

## 2021-01-02 DIAGNOSIS — Z992 Dependence on renal dialysis: Secondary | ICD-10-CM | POA: Diagnosis not present

## 2021-01-03 DIAGNOSIS — N2581 Secondary hyperparathyroidism of renal origin: Secondary | ICD-10-CM | POA: Diagnosis not present

## 2021-01-03 DIAGNOSIS — N186 End stage renal disease: Secondary | ICD-10-CM | POA: Diagnosis not present

## 2021-01-03 DIAGNOSIS — D509 Iron deficiency anemia, unspecified: Secondary | ICD-10-CM | POA: Diagnosis not present

## 2021-01-03 DIAGNOSIS — Z992 Dependence on renal dialysis: Secondary | ICD-10-CM | POA: Diagnosis not present

## 2021-01-04 DIAGNOSIS — N2581 Secondary hyperparathyroidism of renal origin: Secondary | ICD-10-CM | POA: Diagnosis not present

## 2021-01-04 DIAGNOSIS — Z992 Dependence on renal dialysis: Secondary | ICD-10-CM | POA: Diagnosis not present

## 2021-01-04 DIAGNOSIS — D509 Iron deficiency anemia, unspecified: Secondary | ICD-10-CM | POA: Diagnosis not present

## 2021-01-04 DIAGNOSIS — N186 End stage renal disease: Secondary | ICD-10-CM | POA: Diagnosis not present

## 2021-01-05 DIAGNOSIS — N186 End stage renal disease: Secondary | ICD-10-CM | POA: Diagnosis not present

## 2021-01-05 DIAGNOSIS — N2581 Secondary hyperparathyroidism of renal origin: Secondary | ICD-10-CM | POA: Diagnosis not present

## 2021-01-05 DIAGNOSIS — D509 Iron deficiency anemia, unspecified: Secondary | ICD-10-CM | POA: Diagnosis not present

## 2021-01-05 DIAGNOSIS — Z992 Dependence on renal dialysis: Secondary | ICD-10-CM | POA: Diagnosis not present

## 2021-01-06 DIAGNOSIS — D509 Iron deficiency anemia, unspecified: Secondary | ICD-10-CM | POA: Diagnosis not present

## 2021-01-06 DIAGNOSIS — N2581 Secondary hyperparathyroidism of renal origin: Secondary | ICD-10-CM | POA: Diagnosis not present

## 2021-01-06 DIAGNOSIS — Z992 Dependence on renal dialysis: Secondary | ICD-10-CM | POA: Diagnosis not present

## 2021-01-06 DIAGNOSIS — N186 End stage renal disease: Secondary | ICD-10-CM | POA: Diagnosis not present

## 2021-01-07 DIAGNOSIS — Z992 Dependence on renal dialysis: Secondary | ICD-10-CM | POA: Diagnosis not present

## 2021-01-07 DIAGNOSIS — N186 End stage renal disease: Secondary | ICD-10-CM | POA: Diagnosis not present

## 2021-01-07 DIAGNOSIS — N2581 Secondary hyperparathyroidism of renal origin: Secondary | ICD-10-CM | POA: Diagnosis not present

## 2021-01-07 DIAGNOSIS — D509 Iron deficiency anemia, unspecified: Secondary | ICD-10-CM | POA: Diagnosis not present

## 2021-01-08 DIAGNOSIS — N186 End stage renal disease: Secondary | ICD-10-CM | POA: Diagnosis not present

## 2021-01-08 DIAGNOSIS — N2581 Secondary hyperparathyroidism of renal origin: Secondary | ICD-10-CM | POA: Diagnosis not present

## 2021-01-08 DIAGNOSIS — Z992 Dependence on renal dialysis: Secondary | ICD-10-CM | POA: Diagnosis not present

## 2021-01-08 DIAGNOSIS — D509 Iron deficiency anemia, unspecified: Secondary | ICD-10-CM | POA: Diagnosis not present

## 2021-01-09 DIAGNOSIS — N2581 Secondary hyperparathyroidism of renal origin: Secondary | ICD-10-CM | POA: Diagnosis not present

## 2021-01-09 DIAGNOSIS — Z992 Dependence on renal dialysis: Secondary | ICD-10-CM | POA: Diagnosis not present

## 2021-01-09 DIAGNOSIS — N186 End stage renal disease: Secondary | ICD-10-CM | POA: Diagnosis not present

## 2021-01-09 DIAGNOSIS — D509 Iron deficiency anemia, unspecified: Secondary | ICD-10-CM | POA: Diagnosis not present

## 2021-01-10 DIAGNOSIS — N186 End stage renal disease: Secondary | ICD-10-CM | POA: Diagnosis not present

## 2021-01-10 DIAGNOSIS — N2581 Secondary hyperparathyroidism of renal origin: Secondary | ICD-10-CM | POA: Diagnosis not present

## 2021-01-10 DIAGNOSIS — D509 Iron deficiency anemia, unspecified: Secondary | ICD-10-CM | POA: Diagnosis not present

## 2021-01-10 DIAGNOSIS — Z992 Dependence on renal dialysis: Secondary | ICD-10-CM | POA: Diagnosis not present

## 2021-01-11 DIAGNOSIS — D509 Iron deficiency anemia, unspecified: Secondary | ICD-10-CM | POA: Diagnosis not present

## 2021-01-11 DIAGNOSIS — N2581 Secondary hyperparathyroidism of renal origin: Secondary | ICD-10-CM | POA: Diagnosis not present

## 2021-01-11 DIAGNOSIS — N186 End stage renal disease: Secondary | ICD-10-CM | POA: Diagnosis not present

## 2021-01-11 DIAGNOSIS — Z992 Dependence on renal dialysis: Secondary | ICD-10-CM | POA: Diagnosis not present

## 2021-01-12 DIAGNOSIS — D509 Iron deficiency anemia, unspecified: Secondary | ICD-10-CM | POA: Diagnosis not present

## 2021-01-12 DIAGNOSIS — Z992 Dependence on renal dialysis: Secondary | ICD-10-CM | POA: Diagnosis not present

## 2021-01-12 DIAGNOSIS — N186 End stage renal disease: Secondary | ICD-10-CM | POA: Diagnosis not present

## 2021-01-12 DIAGNOSIS — N2581 Secondary hyperparathyroidism of renal origin: Secondary | ICD-10-CM | POA: Diagnosis not present

## 2021-01-13 DIAGNOSIS — N186 End stage renal disease: Secondary | ICD-10-CM | POA: Diagnosis not present

## 2021-01-13 DIAGNOSIS — Z992 Dependence on renal dialysis: Secondary | ICD-10-CM | POA: Diagnosis not present

## 2021-01-13 DIAGNOSIS — D509 Iron deficiency anemia, unspecified: Secondary | ICD-10-CM | POA: Diagnosis not present

## 2021-01-13 DIAGNOSIS — N2581 Secondary hyperparathyroidism of renal origin: Secondary | ICD-10-CM | POA: Diagnosis not present

## 2021-01-14 DIAGNOSIS — D509 Iron deficiency anemia, unspecified: Secondary | ICD-10-CM | POA: Diagnosis not present

## 2021-01-14 DIAGNOSIS — N2581 Secondary hyperparathyroidism of renal origin: Secondary | ICD-10-CM | POA: Diagnosis not present

## 2021-01-14 DIAGNOSIS — N186 End stage renal disease: Secondary | ICD-10-CM | POA: Diagnosis not present

## 2021-01-14 DIAGNOSIS — Z992 Dependence on renal dialysis: Secondary | ICD-10-CM | POA: Diagnosis not present

## 2021-01-15 DIAGNOSIS — Z992 Dependence on renal dialysis: Secondary | ICD-10-CM | POA: Diagnosis not present

## 2021-01-15 DIAGNOSIS — D509 Iron deficiency anemia, unspecified: Secondary | ICD-10-CM | POA: Diagnosis not present

## 2021-01-15 DIAGNOSIS — N2581 Secondary hyperparathyroidism of renal origin: Secondary | ICD-10-CM | POA: Diagnosis not present

## 2021-01-15 DIAGNOSIS — N186 End stage renal disease: Secondary | ICD-10-CM | POA: Diagnosis not present

## 2021-01-16 DIAGNOSIS — N2581 Secondary hyperparathyroidism of renal origin: Secondary | ICD-10-CM | POA: Diagnosis not present

## 2021-01-16 DIAGNOSIS — N186 End stage renal disease: Secondary | ICD-10-CM | POA: Diagnosis not present

## 2021-01-16 DIAGNOSIS — Z992 Dependence on renal dialysis: Secondary | ICD-10-CM | POA: Diagnosis not present

## 2021-01-16 DIAGNOSIS — D509 Iron deficiency anemia, unspecified: Secondary | ICD-10-CM | POA: Diagnosis not present

## 2021-01-17 DIAGNOSIS — Z992 Dependence on renal dialysis: Secondary | ICD-10-CM | POA: Diagnosis not present

## 2021-01-17 DIAGNOSIS — D509 Iron deficiency anemia, unspecified: Secondary | ICD-10-CM | POA: Diagnosis not present

## 2021-01-17 DIAGNOSIS — N2581 Secondary hyperparathyroidism of renal origin: Secondary | ICD-10-CM | POA: Diagnosis not present

## 2021-01-17 DIAGNOSIS — N186 End stage renal disease: Secondary | ICD-10-CM | POA: Diagnosis not present

## 2021-01-18 DIAGNOSIS — Z992 Dependence on renal dialysis: Secondary | ICD-10-CM | POA: Diagnosis not present

## 2021-01-18 DIAGNOSIS — D509 Iron deficiency anemia, unspecified: Secondary | ICD-10-CM | POA: Diagnosis not present

## 2021-01-18 DIAGNOSIS — N186 End stage renal disease: Secondary | ICD-10-CM | POA: Diagnosis not present

## 2021-01-18 DIAGNOSIS — N2581 Secondary hyperparathyroidism of renal origin: Secondary | ICD-10-CM | POA: Diagnosis not present

## 2021-01-19 DIAGNOSIS — N186 End stage renal disease: Secondary | ICD-10-CM | POA: Diagnosis not present

## 2021-01-19 DIAGNOSIS — Z992 Dependence on renal dialysis: Secondary | ICD-10-CM | POA: Diagnosis not present

## 2021-01-19 DIAGNOSIS — N2581 Secondary hyperparathyroidism of renal origin: Secondary | ICD-10-CM | POA: Diagnosis not present

## 2021-01-19 DIAGNOSIS — D509 Iron deficiency anemia, unspecified: Secondary | ICD-10-CM | POA: Diagnosis not present

## 2021-01-20 DIAGNOSIS — D509 Iron deficiency anemia, unspecified: Secondary | ICD-10-CM | POA: Diagnosis not present

## 2021-01-20 DIAGNOSIS — N186 End stage renal disease: Secondary | ICD-10-CM | POA: Diagnosis not present

## 2021-01-20 DIAGNOSIS — N2581 Secondary hyperparathyroidism of renal origin: Secondary | ICD-10-CM | POA: Diagnosis not present

## 2021-01-20 DIAGNOSIS — Z992 Dependence on renal dialysis: Secondary | ICD-10-CM | POA: Diagnosis not present

## 2021-01-21 DIAGNOSIS — Z992 Dependence on renal dialysis: Secondary | ICD-10-CM | POA: Diagnosis not present

## 2021-01-21 DIAGNOSIS — N2581 Secondary hyperparathyroidism of renal origin: Secondary | ICD-10-CM | POA: Diagnosis not present

## 2021-01-21 DIAGNOSIS — D509 Iron deficiency anemia, unspecified: Secondary | ICD-10-CM | POA: Diagnosis not present

## 2021-01-21 DIAGNOSIS — N186 End stage renal disease: Secondary | ICD-10-CM | POA: Diagnosis not present

## 2021-01-22 DIAGNOSIS — N186 End stage renal disease: Secondary | ICD-10-CM | POA: Diagnosis not present

## 2021-01-22 DIAGNOSIS — D509 Iron deficiency anemia, unspecified: Secondary | ICD-10-CM | POA: Diagnosis not present

## 2021-01-22 DIAGNOSIS — N2581 Secondary hyperparathyroidism of renal origin: Secondary | ICD-10-CM | POA: Diagnosis not present

## 2021-01-22 DIAGNOSIS — Z992 Dependence on renal dialysis: Secondary | ICD-10-CM | POA: Diagnosis not present

## 2021-01-23 DIAGNOSIS — N2581 Secondary hyperparathyroidism of renal origin: Secondary | ICD-10-CM | POA: Diagnosis not present

## 2021-01-23 DIAGNOSIS — D509 Iron deficiency anemia, unspecified: Secondary | ICD-10-CM | POA: Diagnosis not present

## 2021-01-23 DIAGNOSIS — Z992 Dependence on renal dialysis: Secondary | ICD-10-CM | POA: Diagnosis not present

## 2021-01-23 DIAGNOSIS — N186 End stage renal disease: Secondary | ICD-10-CM | POA: Diagnosis not present

## 2021-01-24 DIAGNOSIS — N186 End stage renal disease: Secondary | ICD-10-CM | POA: Diagnosis not present

## 2021-01-24 DIAGNOSIS — Z20822 Contact with and (suspected) exposure to covid-19: Secondary | ICD-10-CM | POA: Diagnosis not present

## 2021-01-24 DIAGNOSIS — D509 Iron deficiency anemia, unspecified: Secondary | ICD-10-CM | POA: Diagnosis not present

## 2021-01-24 DIAGNOSIS — Z992 Dependence on renal dialysis: Secondary | ICD-10-CM | POA: Diagnosis not present

## 2021-01-24 DIAGNOSIS — N2581 Secondary hyperparathyroidism of renal origin: Secondary | ICD-10-CM | POA: Diagnosis not present

## 2021-01-25 DIAGNOSIS — N2581 Secondary hyperparathyroidism of renal origin: Secondary | ICD-10-CM | POA: Diagnosis not present

## 2021-01-25 DIAGNOSIS — M85851 Other specified disorders of bone density and structure, right thigh: Secondary | ICD-10-CM | POA: Diagnosis not present

## 2021-01-25 DIAGNOSIS — N186 End stage renal disease: Secondary | ICD-10-CM | POA: Diagnosis not present

## 2021-01-25 DIAGNOSIS — M810 Age-related osteoporosis without current pathological fracture: Secondary | ICD-10-CM | POA: Diagnosis not present

## 2021-01-25 DIAGNOSIS — D509 Iron deficiency anemia, unspecified: Secondary | ICD-10-CM | POA: Diagnosis not present

## 2021-01-25 DIAGNOSIS — Z992 Dependence on renal dialysis: Secondary | ICD-10-CM | POA: Diagnosis not present

## 2021-01-26 DIAGNOSIS — D509 Iron deficiency anemia, unspecified: Secondary | ICD-10-CM | POA: Diagnosis not present

## 2021-01-26 DIAGNOSIS — Z992 Dependence on renal dialysis: Secondary | ICD-10-CM | POA: Diagnosis not present

## 2021-01-26 DIAGNOSIS — N2581 Secondary hyperparathyroidism of renal origin: Secondary | ICD-10-CM | POA: Diagnosis not present

## 2021-01-26 DIAGNOSIS — N186 End stage renal disease: Secondary | ICD-10-CM | POA: Diagnosis not present

## 2021-01-27 DIAGNOSIS — N2581 Secondary hyperparathyroidism of renal origin: Secondary | ICD-10-CM | POA: Diagnosis not present

## 2021-01-27 DIAGNOSIS — N186 End stage renal disease: Secondary | ICD-10-CM | POA: Diagnosis not present

## 2021-01-27 DIAGNOSIS — D509 Iron deficiency anemia, unspecified: Secondary | ICD-10-CM | POA: Diagnosis not present

## 2021-01-27 DIAGNOSIS — Z992 Dependence on renal dialysis: Secondary | ICD-10-CM | POA: Diagnosis not present

## 2021-01-28 DIAGNOSIS — N2581 Secondary hyperparathyroidism of renal origin: Secondary | ICD-10-CM | POA: Diagnosis not present

## 2021-01-28 DIAGNOSIS — N186 End stage renal disease: Secondary | ICD-10-CM | POA: Diagnosis not present

## 2021-01-28 DIAGNOSIS — D509 Iron deficiency anemia, unspecified: Secondary | ICD-10-CM | POA: Diagnosis not present

## 2021-01-28 DIAGNOSIS — Z992 Dependence on renal dialysis: Secondary | ICD-10-CM | POA: Diagnosis not present

## 2021-01-29 DIAGNOSIS — N186 End stage renal disease: Secondary | ICD-10-CM | POA: Diagnosis not present

## 2021-01-29 DIAGNOSIS — D509 Iron deficiency anemia, unspecified: Secondary | ICD-10-CM | POA: Diagnosis not present

## 2021-01-29 DIAGNOSIS — Z992 Dependence on renal dialysis: Secondary | ICD-10-CM | POA: Diagnosis not present

## 2021-01-29 DIAGNOSIS — N2581 Secondary hyperparathyroidism of renal origin: Secondary | ICD-10-CM | POA: Diagnosis not present

## 2021-01-30 DIAGNOSIS — D509 Iron deficiency anemia, unspecified: Secondary | ICD-10-CM | POA: Diagnosis not present

## 2021-01-30 DIAGNOSIS — N2581 Secondary hyperparathyroidism of renal origin: Secondary | ICD-10-CM | POA: Diagnosis not present

## 2021-01-30 DIAGNOSIS — Z992 Dependence on renal dialysis: Secondary | ICD-10-CM | POA: Diagnosis not present

## 2021-01-30 DIAGNOSIS — N186 End stage renal disease: Secondary | ICD-10-CM | POA: Diagnosis not present

## 2021-01-31 DIAGNOSIS — N2581 Secondary hyperparathyroidism of renal origin: Secondary | ICD-10-CM | POA: Diagnosis not present

## 2021-01-31 DIAGNOSIS — Z992 Dependence on renal dialysis: Secondary | ICD-10-CM | POA: Diagnosis not present

## 2021-01-31 DIAGNOSIS — D509 Iron deficiency anemia, unspecified: Secondary | ICD-10-CM | POA: Diagnosis not present

## 2021-01-31 DIAGNOSIS — N186 End stage renal disease: Secondary | ICD-10-CM | POA: Diagnosis not present

## 2021-02-01 DIAGNOSIS — N186 End stage renal disease: Secondary | ICD-10-CM | POA: Diagnosis not present

## 2021-02-01 DIAGNOSIS — D509 Iron deficiency anemia, unspecified: Secondary | ICD-10-CM | POA: Diagnosis not present

## 2021-02-01 DIAGNOSIS — N2581 Secondary hyperparathyroidism of renal origin: Secondary | ICD-10-CM | POA: Diagnosis not present

## 2021-02-01 DIAGNOSIS — Z992 Dependence on renal dialysis: Secondary | ICD-10-CM | POA: Diagnosis not present

## 2021-02-02 DIAGNOSIS — N2581 Secondary hyperparathyroidism of renal origin: Secondary | ICD-10-CM | POA: Diagnosis not present

## 2021-02-02 DIAGNOSIS — Z992 Dependence on renal dialysis: Secondary | ICD-10-CM | POA: Diagnosis not present

## 2021-02-02 DIAGNOSIS — D509 Iron deficiency anemia, unspecified: Secondary | ICD-10-CM | POA: Diagnosis not present

## 2021-02-02 DIAGNOSIS — N186 End stage renal disease: Secondary | ICD-10-CM | POA: Diagnosis not present

## 2021-02-03 DIAGNOSIS — N186 End stage renal disease: Secondary | ICD-10-CM | POA: Diagnosis not present

## 2021-02-03 DIAGNOSIS — Z992 Dependence on renal dialysis: Secondary | ICD-10-CM | POA: Diagnosis not present

## 2021-02-03 DIAGNOSIS — N2581 Secondary hyperparathyroidism of renal origin: Secondary | ICD-10-CM | POA: Diagnosis not present

## 2021-02-03 DIAGNOSIS — D509 Iron deficiency anemia, unspecified: Secondary | ICD-10-CM | POA: Diagnosis not present

## 2021-02-04 DIAGNOSIS — Z992 Dependence on renal dialysis: Secondary | ICD-10-CM | POA: Diagnosis not present

## 2021-02-04 DIAGNOSIS — D509 Iron deficiency anemia, unspecified: Secondary | ICD-10-CM | POA: Diagnosis not present

## 2021-02-04 DIAGNOSIS — N186 End stage renal disease: Secondary | ICD-10-CM | POA: Diagnosis not present

## 2021-02-04 DIAGNOSIS — N2581 Secondary hyperparathyroidism of renal origin: Secondary | ICD-10-CM | POA: Diagnosis not present

## 2021-02-05 DIAGNOSIS — Z131 Encounter for screening for diabetes mellitus: Secondary | ICD-10-CM | POA: Diagnosis not present

## 2021-02-05 DIAGNOSIS — Z1331 Encounter for screening for depression: Secondary | ICD-10-CM | POA: Diagnosis not present

## 2021-02-05 DIAGNOSIS — I10 Essential (primary) hypertension: Secondary | ICD-10-CM | POA: Diagnosis not present

## 2021-02-05 DIAGNOSIS — Z125 Encounter for screening for malignant neoplasm of prostate: Secondary | ICD-10-CM | POA: Diagnosis not present

## 2021-02-05 DIAGNOSIS — N401 Enlarged prostate with lower urinary tract symptoms: Secondary | ICD-10-CM | POA: Diagnosis not present

## 2021-02-05 DIAGNOSIS — Z0000 Encounter for general adult medical examination without abnormal findings: Secondary | ICD-10-CM | POA: Diagnosis not present

## 2021-02-05 DIAGNOSIS — D508 Other iron deficiency anemias: Secondary | ICD-10-CM | POA: Diagnosis not present

## 2021-02-05 DIAGNOSIS — N186 End stage renal disease: Secondary | ICD-10-CM | POA: Diagnosis not present

## 2021-02-05 DIAGNOSIS — D509 Iron deficiency anemia, unspecified: Secondary | ICD-10-CM | POA: Diagnosis not present

## 2021-02-05 DIAGNOSIS — N185 Chronic kidney disease, stage 5: Secondary | ICD-10-CM | POA: Diagnosis not present

## 2021-02-05 DIAGNOSIS — Z992 Dependence on renal dialysis: Secondary | ICD-10-CM | POA: Diagnosis not present

## 2021-02-05 DIAGNOSIS — K219 Gastro-esophageal reflux disease without esophagitis: Secondary | ICD-10-CM | POA: Diagnosis not present

## 2021-02-05 DIAGNOSIS — N2581 Secondary hyperparathyroidism of renal origin: Secondary | ICD-10-CM | POA: Diagnosis not present

## 2021-02-06 DIAGNOSIS — N186 End stage renal disease: Secondary | ICD-10-CM | POA: Diagnosis not present

## 2021-02-06 DIAGNOSIS — Z992 Dependence on renal dialysis: Secondary | ICD-10-CM | POA: Diagnosis not present

## 2021-02-06 DIAGNOSIS — N2581 Secondary hyperparathyroidism of renal origin: Secondary | ICD-10-CM | POA: Diagnosis not present

## 2021-02-06 DIAGNOSIS — D509 Iron deficiency anemia, unspecified: Secondary | ICD-10-CM | POA: Diagnosis not present

## 2021-02-07 DIAGNOSIS — Z992 Dependence on renal dialysis: Secondary | ICD-10-CM | POA: Diagnosis not present

## 2021-02-07 DIAGNOSIS — N186 End stage renal disease: Secondary | ICD-10-CM | POA: Diagnosis not present

## 2021-02-07 DIAGNOSIS — N2581 Secondary hyperparathyroidism of renal origin: Secondary | ICD-10-CM | POA: Diagnosis not present

## 2021-02-07 DIAGNOSIS — D509 Iron deficiency anemia, unspecified: Secondary | ICD-10-CM | POA: Diagnosis not present

## 2021-02-08 DIAGNOSIS — D509 Iron deficiency anemia, unspecified: Secondary | ICD-10-CM | POA: Diagnosis not present

## 2021-02-08 DIAGNOSIS — N2581 Secondary hyperparathyroidism of renal origin: Secondary | ICD-10-CM | POA: Diagnosis not present

## 2021-02-08 DIAGNOSIS — N186 End stage renal disease: Secondary | ICD-10-CM | POA: Diagnosis not present

## 2021-02-08 DIAGNOSIS — Z992 Dependence on renal dialysis: Secondary | ICD-10-CM | POA: Diagnosis not present

## 2021-02-09 DIAGNOSIS — Z992 Dependence on renal dialysis: Secondary | ICD-10-CM | POA: Diagnosis not present

## 2021-02-09 DIAGNOSIS — N2581 Secondary hyperparathyroidism of renal origin: Secondary | ICD-10-CM | POA: Diagnosis not present

## 2021-02-09 DIAGNOSIS — N186 End stage renal disease: Secondary | ICD-10-CM | POA: Diagnosis not present

## 2021-02-09 DIAGNOSIS — D509 Iron deficiency anemia, unspecified: Secondary | ICD-10-CM | POA: Diagnosis not present

## 2021-02-10 DIAGNOSIS — D509 Iron deficiency anemia, unspecified: Secondary | ICD-10-CM | POA: Diagnosis not present

## 2021-02-10 DIAGNOSIS — N2581 Secondary hyperparathyroidism of renal origin: Secondary | ICD-10-CM | POA: Diagnosis not present

## 2021-02-10 DIAGNOSIS — Z992 Dependence on renal dialysis: Secondary | ICD-10-CM | POA: Diagnosis not present

## 2021-02-10 DIAGNOSIS — N186 End stage renal disease: Secondary | ICD-10-CM | POA: Diagnosis not present

## 2021-02-11 DIAGNOSIS — Z992 Dependence on renal dialysis: Secondary | ICD-10-CM | POA: Diagnosis not present

## 2021-02-11 DIAGNOSIS — N186 End stage renal disease: Secondary | ICD-10-CM | POA: Diagnosis not present

## 2021-02-11 DIAGNOSIS — D509 Iron deficiency anemia, unspecified: Secondary | ICD-10-CM | POA: Diagnosis not present

## 2021-02-11 DIAGNOSIS — N2581 Secondary hyperparathyroidism of renal origin: Secondary | ICD-10-CM | POA: Diagnosis not present

## 2021-02-12 DIAGNOSIS — N186 End stage renal disease: Secondary | ICD-10-CM | POA: Diagnosis not present

## 2021-02-12 DIAGNOSIS — N2581 Secondary hyperparathyroidism of renal origin: Secondary | ICD-10-CM | POA: Diagnosis not present

## 2021-02-12 DIAGNOSIS — Z992 Dependence on renal dialysis: Secondary | ICD-10-CM | POA: Diagnosis not present

## 2021-02-12 DIAGNOSIS — D509 Iron deficiency anemia, unspecified: Secondary | ICD-10-CM | POA: Diagnosis not present

## 2021-02-12 NOTE — Progress Notes (Addendum)
Cardiology Office Note  Date: 02/13/2021   ID: Rodney Gordon, DOB 1955/08/09, MRN [id number]  PCP:  Neale Burly, MD  Cardiologist:  Rozann Lesches, MD Electrophysiologist:  None   Chief Complaint: 16-monthfollow-up  History of Present Illness: Rodney SANAis a 65y.o. male with a history of aortic valve insufficiency (mild to moderate), CAD nonobstructive by cardiac catheterization 2004.  ESRD on PD, HTN, PVD.  Last encounter with Dr. MDomenic Polite8/10/2018 via telemedicine: He did not report any major changes in terms of cardiac symptoms over the prior year has stable NYHA class II dyspnea.  No recurrent chest pain or palpitations.  He was undergoing PD and followed up with nephrology.  Recent echo LVEF 60 to 65% with moderate aortic regurgitation.  Blood pressure was well controlled.  There were no changes made to current regimen.  He is here for 675-monthollow-up today.  He states recently over the past few months his heart rate has been increasing when he does more than usual ADLs.  He states that sometimes his heart rate reaches up to the 140 bpm range.  He states his blood pressures have also been low at times when he uses different concentrated solutions in his PD regimen.  He states when he uses 2.5% solution his blood pressure sometimes drops to the [id number].  He states he has spoken to nephrology who has advised him to use 1.5% solution when this occurs.  He denies any anginal symptoms.  Blood pressure today is 138/86.  Denies any anginal symptoms.  Denies any DOE or SOB.  No orthostatic symptoms, CVA or TIA-like symptoms, PND, orthopnea, bleeding.  Denies any claudication-like symptoms, DVT or PE-like symptoms.  No lower extremity edema noted.   Past Medical History:  Diagnosis Date   Anemia of chronic disease    Aortic insufficiency    Mild to moderate   Coronary atherosclerosis of native coronary artery    Nonobstructive at catheterization 2004   [facility] Essential hypertension    [facility]    Past Surgical History:  Procedure Laterality Date   A/V FISTULAGRAM Right 09/05/2017   Procedure: A/V FISTULAGRAM;  Surgeon: DiAngelia MouldMD;  [facility];  Service: Cardiovascular;  Laterality: Right;   AV FISTULA PLACEMENT Right 12/17/2016   Procedure: ARTERIOVENOUS (AV) FISTULA CREATION- RIGHT BRACHIOCEPHALIC;  Surgeon: FiElam DutchMD;  [place];  Service: Vascular;  Laterality: Right;   CAPD INSERTION N/A 11/03/2017   Procedure: LAPAROSCOPIC INSERTION CONTINUOUS AMBULATORY PERITONEAL DIALYSIS  (CAPD) CATHETER;  Surgeon: CoClovis RileyMD;  [place];  Laterality: N/A;   CARDIAC CATHETERIZATION     [facility]   [place]Y [place] Right 04/24/2017   IR [place] VASC ACCESS RIGHT  04/24/2017   PERIPHERAL VASCULAR BALLOON ANGIOPLASTY Right 09/05/2017   Procedure: PERIPHERAL VASCULAR BALLOON ANGIOPLASTY;  Surgeon: DiAngelia MouldMD;  [place] [facility];  Service: Cardiovascular;  Laterality: Right;   PROSTATE SURGERY     TIBIA FRACTURE SURGERY     Left    Current Outpatient Medications  Medication Sig Dispense Refill   acetaminophen (TYLENOL) 500 MG tablet Take 1,000 mg by mouth 2 (two) times daily as needed for mild pain.     acetaminophen (TYLENOL) 650 MG CR tablet Take 650 mg by mouth daily as  needed for pain.     amLODipine (NORVASC) 10 MG tablet Take 1 tablet (10 mg total) by mouth daily. 30 tablet 6   apixaban (ELIQUIS) 5 MG TABS tablet Take 1 tablet (5 mg total) by mouth 2 (two) times daily. 60 tablet 11   atenolol-chlorthalidone (TENORETIC) 50-25 MG tablet Take 1 tablet by mouth daily.     calcium acetate (PHOSLO) 667 MG capsule Take 667 mg by mouth 3 (three) times daily with meals.     ciprofloxacin (CIPRO) 500 MG tablet Take 500 mg by mouth daily.     diltiazem  (CARDIZEM CD) 120 MG 24 hr capsule Take 1 capsule (120 mg total) by mouth daily. 90 capsule 3   diphenhydramine-acetaminophen (TYLENOL PM) 25-500 MG TABS tablet Take 1 tablet by mouth 2 (two) times daily as needed (PAIN).     doxazosin (CARDURA) 8 MG tablet Take 8 mg by mouth daily.     furosemide (LASIX) 80 MG tablet Take 80 mg by mouth 2 (two) times daily.     lidocaine-prilocaine (EMLA) cream Apply 1 application topically as needed (prior to dialysis).      losartan (COZAAR) 50 MG tablet Take 50 mg by mouth daily.     methocarbamol (ROBAXIN) 500 MG tablet Take 500 mg by mouth at bedtime.     minoxidil (LONITEN) 2.5 MG tablet Take 5 mg by mouth daily.     multivitamin (RENA-VIT) TABS tablet Take 1 tablet by mouth daily.     nitroGLYCERIN (NITROSTAT) 0.4 MG SL tablet Place 1 tablet (0.4 mg total) under the tongue as directed. (Patient taking differently: Place 0.4 mg under the tongue every 5 (five) minutes as needed for chest pain.) 25 tablet 2   oxyCODONE-acetaminophen (PERCOCET/ROXICET) 5-325 MG tablet Take 1 tablet by mouth every 6 (six) hours as needed for severe pain. 20 tablet 0   potassium chloride SA (K-DUR) 20 MEQ tablet Take 20 mEq by mouth daily.     sildenafil (VIAGRA) 50 MG tablet Take 50 mg by mouth daily as needed.     sodium bicarbonate 650 MG tablet Take 1,300 mg by mouth 3 (three) times daily.      tamsulosin (FLOMAX) 0.4 MG CAPS capsule Take 1 capsule by mouth daily.     triamcinolone cream (KENALOG) 0.1 % Apply topically daily.     isosorbide mononitrate (IMDUR) 30 MG 24 hr tablet Take 30 mg by mouth daily.     No current facility-administered medications for this visit.   Allergies:  Patient has no known allergies.   Social History: The patient  reports that he quit smoking about 22 years ago. His smoking use included cigarettes. He started smoking about 39 years ago. He has a 25.00 pack-year smoking history. He has never used smokeless tobacco. He reports that he does not  drink alcohol and does not use drugs.   Family History: The patient's family history includes Cancer in an other family member; Coronary artery disease in an other family member.   ROS:  Please see the history of present illness. Otherwise, complete review of systems is positive for none.  All other systems are reviewed and negative.   Physical Exam: VS:  BP 138/86   Pulse 72   Ht '5\' 9"'$  (1.753 m)   Wt 166 lb (75.3 kg)   SpO2 100%   BMI 24.51 kg/m , BMI Body mass index is 24.51 kg/m.  Wt Readings from Last 3 Encounters:  02/13/21 166 lb (75.3 kg)  04/28/20 167  lb 9.6 oz (76 kg)  02/23/19 176 lb 1.6 oz (79.9 kg)    General: Patient appears comfortable at rest. Neck: Supple, no elevated JVP or carotid bruits, no thyromegaly. Lungs: Clear to auscultation, nonlabored breathing at rest. Cardiac: Irregularly irregular rate and rhythm, no S3 or significant systolic murmur, no pericardial rub. Abdomen: Soft, nontender, PD catheter present. Extremities: No pitting edema, distal pulses 2+. Skin: Warm and dry. Musculoskeletal: No kyphosis. Neuropsychiatric: Alert and oriented x3, affect grossly appropriate.  ECG: EKG 02/13/2021 atrial fibrillation with RVR with premature ventricular aberrantly conducted complexes rate 124.  Nonspecific ST and T wave abnormality  Recent Labwork: No results found for requested labs within last 8760 hours.  No results found for: CHOL, TRIG, HDL, CHOLHDL, VLDL, LDLCALC, LDLDIRECT  Other Studies Reviewed Today:  Echocardiogram 03/14/2020 1. Left ventricular ejection fraction, by estimation, is 70 to 75%. The left ventricle has hyperdynamic function. The left ventricle has no regional wall motion abnormalities. There is mild left ventricular hypertrophy. Left ventricular diastolic parameters are consistent with Grade I diastolic dysfunction (impaired relaxation). 2. Right ventricular systolic function is normal. The right ventricular size is normal. There  is normal pulmonary artery systolic pressure. The estimated right ventricular systolic pressure is [id number] mmHg. 3. The mitral valve is grossly normal. Trivial mitral valve regurgitation. 4. The aortic valve is tricuspid, mildly calcified with decreased left coronary cusp motion but no stenosis. Aortic valve regurgitation is moderate. 5. The inferior vena cava is normal in size with greater than 50% respiratory variability, suggesting right atrial pressure of 3 mmHg.    Echocardiogram 02/04/2019:  1. The left ventricle has normal systolic function with an ejection fraction of 60-65%. The cavity size was normal. Left ventricular diastolic Doppler parameters are consistent with impaired relaxation. Elevated left ventricular end-diastolic pressure  No evidence of left ventricular regional wall motion abnormalities.  2. The right ventricle has low normal systolic function. The cavity was normal. There is mildly increased right ventricular wall thickness.  3. Left atrial size was mildly dilated.  4. The aortic valve is tricuspid. Mild thickening of the aortic valve. Aortic valve regurgitation is moderate by color flow Doppler. Moderate aortic annular calcification noted.  5. The mitral valve is grossly normal. Mitral valve regurgitation is moderate by color flow Doppler.  6. The tricuspid valve is grossly normal.  7. The aortic root is normal in size and structure.  Assessment and Plan:  1. New onset atrial fibrillation ([place])   2. Aortic valve insufficiency, etiology of cardiac valve disease unspecified   3. ESRD (end stage renal disease) ([place])   4. Essential hypertension   5. CAD in native artery     1.  New onset atrial fibrillation RVR Patient's heart rate noted to be irregularly irregular and rapid today.  EKG shows atrial fibrillation with RVR rate of 124.  Start Cardizem CD 120 mg p.o. daily.  Start Eliquis 5 mg p.o. twice daily.   Stop aspirin.  Stop amlodipine.  Get baseline CBC and  BMP  2. Aortic valve insufficiency, etiology of cardiac valve disease unspecified Recent repeat echocardiogram March 14, 2020 to reassess aortic insufficiency.  EF 70 to 75%, mild LVH, G1 DD, trivial MR moderate aortic valve regurgitation.  States he has some mild dyspnea on moderate exertion but quickly recovers in a few minutes.  Please schedule follow-up echocardiogram to reassess aortic valve insufficiency.  3. ESRD on peritoneal dialysis  Patient states his dialysis has been going well.  He did state  when he started using higher concentration of solution he noticed increased heart rates about 2 months ago.  States he was told to decrease solution back to 1.5%.  No recent issues with PD catheter.  Follows with nephrology.  4. Essential hypertension BP today 138/86.  Stop amlodipine.  Continue atenolol chlorthalidone 50/25 mg p.o. daily.  Continue Lasix 80 mg p.o. daily.  Continue losartan 50 mg p.o. daily   5.  Nonobstructive CAD. Denies any anginal symptoms.  Does have some mild dyspnea on exertion. Continue aspirin 81 mg, Imdur 30 mg daily, nitroglycerin sublingual as needed for chest pain.    Medication Adjustments/Labs and Tests Ordered: Current medicines are reviewed at length with the patient today.  Concerns regarding medicines are outlined above.   Disposition: Follow-up with Dr. Domenic Polite or APP 1 month  Signed, Levell July, NP 02/13/2021 12:17 PM    [facility] at [place], [place], [place] [address] Phone: [phone]; Fax: [phone]

## 2021-02-13 ENCOUNTER — Other Ambulatory Visit: Payer: Self-pay

## 2021-02-13 ENCOUNTER — Encounter: Payer: Self-pay | Admitting: Family Medicine

## 2021-02-13 ENCOUNTER — Telehealth: Payer: Self-pay | Admitting: *Deleted

## 2021-02-13 ENCOUNTER — Ambulatory Visit (INDEPENDENT_AMBULATORY_CARE_PROVIDER_SITE_OTHER): Payer: Medicare Other | Admitting: Family Medicine

## 2021-02-13 VITALS — BP 138/86 | HR 72 | Ht 69.0 in | Wt 166.0 lb

## 2021-02-13 DIAGNOSIS — I351 Nonrheumatic aortic (valve) insufficiency: Secondary | ICD-10-CM | POA: Diagnosis not present

## 2021-02-13 DIAGNOSIS — D509 Iron deficiency anemia, unspecified: Secondary | ICD-10-CM | POA: Diagnosis not present

## 2021-02-13 DIAGNOSIS — N186 End stage renal disease: Secondary | ICD-10-CM

## 2021-02-13 DIAGNOSIS — Z992 Dependence on renal dialysis: Secondary | ICD-10-CM | POA: Diagnosis not present

## 2021-02-13 DIAGNOSIS — I4891 Unspecified atrial fibrillation: Secondary | ICD-10-CM

## 2021-02-13 DIAGNOSIS — I10 Essential (primary) hypertension: Secondary | ICD-10-CM | POA: Diagnosis not present

## 2021-02-13 DIAGNOSIS — I2510 Atherosclerotic heart disease of native coronary artery without angina pectoris: Secondary | ICD-10-CM

## 2021-02-13 DIAGNOSIS — N2581 Secondary hyperparathyroidism of renal origin: Secondary | ICD-10-CM | POA: Diagnosis not present

## 2021-02-13 MED ORDER — APIXABAN 5 MG PO TABS: 5.0000 mg | ORAL_TABLET | Freq: Two times a day (BID) | ORAL | 11 refills | Status: DC

## 2021-02-13 MED ORDER — DILTIAZEM HCL ER COATED BEADS 120 MG PO CP24: 120.0000 mg | ORAL_CAPSULE | Freq: Every day | ORAL | 3 refills | Status: DC

## 2021-02-13 NOTE — Patient Instructions (Signed)
Medication Instructions:  Your physician has recommended you make the following change in your medication:   Stop taking Advil  Stop Taking Aspirin   Start Taking Eliquis 5 mg Two Times Daily  Start Taking Cardizem CD 120 mg Daily   *If you need a refill on your cardiac medications before your next appointment, please call your pharmacy*   Lab Work: NONE   If you have labs (blood work) drawn today and your tests are completely normal, you will receive your results only by: [place] (if you have MyChart) OR A paper copy in the mail If you have any lab test that is abnormal or we need to change your treatment, we will call you to review the results.   Testing/Procedures: Your physician has requested that you have an echocardiogram. Echocardiography is a painless test that uses sound waves to create images of your heart. It provides your doctor with information about the size and shape of your heart and how well your heart's chambers and valves are working. This procedure takes approximately one hour. There are no restrictions for this procedure.    Follow-Up: At [facility], you and your health needs are our priority.  As part of our continuing mission to provide you with exceptional heart care, we have created designated Provider Care Teams.  These Care Teams include your primary Cardiologist (physician) and Advanced Practice Providers (APPs -  Physician Assistants and Nurse Practitioners) who all work together to provide you with the care you need, when you need it.  We recommend signing up for the patient portal called "MyChart".  Sign up information is provided on this After Visit Summary.  MyChart is used to connect with patients for Virtual Visits (Telemedicine).  Patients are able to view lab/test results, encounter notes, upcoming appointments, etc.  Non-urgent messages can be sent to your provider as well.   To learn more about what you can do with MyChart, go to  [email or web address].    Your next appointment:   4 week(s)  The format for your next appointment:   In Person  Provider:   Katina Dung, NP   Other Instructions Thank you for choosing [facility]!

## 2021-02-13 NOTE — Telephone Encounter (Signed)
-----   Message from Verta Ellen., NP sent at 02/13/2021 12:31 PM EDT ----- Regarding: Labs and Amlodipine Hey I guess we need to stop  Amlodipine on him and get baseline labwork such as CBC and BMET per Dr Domenic Polite. Can you call him and have him stop Amlodipine and tell him we are going to get labs on him ? Thanks

## 2021-02-13 NOTE — Addendum Note (Signed)
Addended by: Levonne Hubert on: 02/13/2021 12:37 PM   Modules accepted: Orders

## 2021-02-13 NOTE — Telephone Encounter (Signed)
Pt notified to stop Norvasc and have labs done. States he will have labs done on tomorrow.

## 2021-02-14 DIAGNOSIS — D509 Iron deficiency anemia, unspecified: Secondary | ICD-10-CM | POA: Diagnosis not present

## 2021-02-14 DIAGNOSIS — Z992 Dependence on renal dialysis: Secondary | ICD-10-CM | POA: Diagnosis not present

## 2021-02-14 DIAGNOSIS — N2581 Secondary hyperparathyroidism of renal origin: Secondary | ICD-10-CM | POA: Diagnosis not present

## 2021-02-14 DIAGNOSIS — N186 End stage renal disease: Secondary | ICD-10-CM | POA: Diagnosis not present

## 2021-02-15 DIAGNOSIS — Z992 Dependence on renal dialysis: Secondary | ICD-10-CM | POA: Diagnosis not present

## 2021-02-15 DIAGNOSIS — N2581 Secondary hyperparathyroidism of renal origin: Secondary | ICD-10-CM | POA: Diagnosis not present

## 2021-02-15 DIAGNOSIS — D509 Iron deficiency anemia, unspecified: Secondary | ICD-10-CM | POA: Diagnosis not present

## 2021-02-15 DIAGNOSIS — N186 End stage renal disease: Secondary | ICD-10-CM | POA: Diagnosis not present

## 2021-02-16 ENCOUNTER — Other Ambulatory Visit (HOSPITAL_COMMUNITY)
Admission: RE | Admit: 2021-02-16 | Discharge: 2021-02-16 | Disposition: A | Discharge status: Home / Self Care | Payer: Medicare Other | Source: Ambulatory Visit | Attending: Family Medicine | Admitting: Family Medicine

## 2021-02-16 ENCOUNTER — Other Ambulatory Visit: Payer: Self-pay

## 2021-02-16 DIAGNOSIS — N2581 Secondary hyperparathyroidism of renal origin: Secondary | ICD-10-CM | POA: Diagnosis not present

## 2021-02-16 DIAGNOSIS — I10 Essential (primary) hypertension: Secondary | ICD-10-CM | POA: Diagnosis not present

## 2021-02-16 DIAGNOSIS — Z992 Dependence on renal dialysis: Secondary | ICD-10-CM | POA: Diagnosis not present

## 2021-02-16 DIAGNOSIS — I4891 Unspecified atrial fibrillation: Secondary | ICD-10-CM | POA: Insufficient documentation

## 2021-02-16 DIAGNOSIS — N186 End stage renal disease: Secondary | ICD-10-CM | POA: Diagnosis not present

## 2021-02-16 DIAGNOSIS — D509 Iron deficiency anemia, unspecified: Secondary | ICD-10-CM | POA: Diagnosis not present

## 2021-02-16 LAB — BASIC METABOLIC PANEL WITH GFR
Anion gap: 10 (ref 5–15)
BUN: 21 mg/dL (ref 8–23)
CO2: 29 mmol/L (ref 22–32)
Calcium: 7.8 mg/dL — ABNORMAL LOW (ref 8.9–10.3)
Chloride: 97 mmol/L — ABNORMAL LOW (ref 98–111)
Creatinine, Ser: 11.85 mg/dL — ABNORMAL HIGH (ref 0.61–1.24)
GFR, Estimated: 4 mL/min — ABNORMAL LOW
Glucose, Bld: 110 mg/dL — ABNORMAL HIGH (ref 70–99)
Potassium: 2.8 mmol/L — ABNORMAL LOW (ref 3.5–5.1)
Sodium: 136 mmol/L (ref 135–145)

## 2021-02-16 LAB — CBC
HCT: 37.9 % — ABNORMAL LOW (ref 39.0–52.0)
Hemoglobin: 13 g/dL (ref 13.0–17.0)
MCH: 37.5 pg — ABNORMAL HIGH (ref 26.0–34.0)
MCHC: 34.3 g/dL (ref 30.0–36.0)
MCV: 109.2 fL — ABNORMAL HIGH (ref 80.0–100.0)
Platelets: 263 K/uL (ref 150–400)
RBC: 3.47 MIL/uL — ABNORMAL LOW (ref 4.22–5.81)
RDW: 14.6 % (ref 11.5–15.5)
WBC: 9.7 K/uL (ref 4.0–10.5)
nRBC: 0 % (ref 0.0–0.2)

## 2021-02-17 DIAGNOSIS — N2581 Secondary hyperparathyroidism of renal origin: Secondary | ICD-10-CM | POA: Diagnosis not present

## 2021-02-17 DIAGNOSIS — N186 End stage renal disease: Secondary | ICD-10-CM | POA: Diagnosis not present

## 2021-02-17 DIAGNOSIS — Z992 Dependence on renal dialysis: Secondary | ICD-10-CM | POA: Diagnosis not present

## 2021-02-17 DIAGNOSIS — D509 Iron deficiency anemia, unspecified: Secondary | ICD-10-CM | POA: Diagnosis not present

## 2021-02-18 DIAGNOSIS — Z992 Dependence on renal dialysis: Secondary | ICD-10-CM | POA: Diagnosis not present

## 2021-02-18 DIAGNOSIS — N186 End stage renal disease: Secondary | ICD-10-CM | POA: Diagnosis not present

## 2021-02-18 DIAGNOSIS — D509 Iron deficiency anemia, unspecified: Secondary | ICD-10-CM | POA: Diagnosis not present

## 2021-02-18 DIAGNOSIS — N2581 Secondary hyperparathyroidism of renal origin: Secondary | ICD-10-CM | POA: Diagnosis not present

## 2021-02-19 ENCOUNTER — Telehealth: Payer: Self-pay | Admitting: *Deleted

## 2021-02-19 DIAGNOSIS — N186 End stage renal disease: Secondary | ICD-10-CM | POA: Diagnosis not present

## 2021-02-19 DIAGNOSIS — N2581 Secondary hyperparathyroidism of renal origin: Secondary | ICD-10-CM | POA: Diagnosis not present

## 2021-02-19 DIAGNOSIS — D509 Iron deficiency anemia, unspecified: Secondary | ICD-10-CM | POA: Diagnosis not present

## 2021-02-19 DIAGNOSIS — Z992 Dependence on renal dialysis: Secondary | ICD-10-CM | POA: Diagnosis not present

## 2021-02-19 NOTE — Telephone Encounter (Signed)
Patient informed and says that his nephrologist told him his potassium was low last week and advised him to increase to K+ 20 meq one TID and he has started this already. Medication profile updated with changes Copy sent to PCP

## 2021-02-19 NOTE — Telephone Encounter (Signed)
-----   Message from Merlene Laughter, RN sent at 02/19/2021  7:37 AM EDT -----  ----- Message ----- From: Verta Ellen., NP Sent: 02/18/2021   8:13 PM EDT To: Laurine Blazer, LPN  Please call the patient and let him know the potassium is a little low. Have him take potassium 20 MEQ daily x 5 days only. Thank You. Verta Ellen, NP  02/18/2021 8:11 PM

## 2021-02-20 DIAGNOSIS — N2581 Secondary hyperparathyroidism of renal origin: Secondary | ICD-10-CM | POA: Diagnosis not present

## 2021-02-20 DIAGNOSIS — N186 End stage renal disease: Secondary | ICD-10-CM | POA: Diagnosis not present

## 2021-02-20 DIAGNOSIS — Z992 Dependence on renal dialysis: Secondary | ICD-10-CM | POA: Diagnosis not present

## 2021-02-20 DIAGNOSIS — D509 Iron deficiency anemia, unspecified: Secondary | ICD-10-CM | POA: Diagnosis not present

## 2021-02-21 DIAGNOSIS — Z992 Dependence on renal dialysis: Secondary | ICD-10-CM | POA: Diagnosis not present

## 2021-02-21 DIAGNOSIS — N2581 Secondary hyperparathyroidism of renal origin: Secondary | ICD-10-CM | POA: Diagnosis not present

## 2021-02-21 DIAGNOSIS — D509 Iron deficiency anemia, unspecified: Secondary | ICD-10-CM | POA: Diagnosis not present

## 2021-02-21 DIAGNOSIS — N186 End stage renal disease: Secondary | ICD-10-CM | POA: Diagnosis not present

## 2021-02-22 DIAGNOSIS — N186 End stage renal disease: Secondary | ICD-10-CM | POA: Diagnosis not present

## 2021-02-22 DIAGNOSIS — D509 Iron deficiency anemia, unspecified: Secondary | ICD-10-CM | POA: Diagnosis not present

## 2021-02-22 DIAGNOSIS — N2581 Secondary hyperparathyroidism of renal origin: Secondary | ICD-10-CM | POA: Diagnosis not present

## 2021-02-22 DIAGNOSIS — Z992 Dependence on renal dialysis: Secondary | ICD-10-CM | POA: Diagnosis not present

## 2021-02-23 DIAGNOSIS — N2581 Secondary hyperparathyroidism of renal origin: Secondary | ICD-10-CM | POA: Diagnosis not present

## 2021-02-23 DIAGNOSIS — Z992 Dependence on renal dialysis: Secondary | ICD-10-CM | POA: Diagnosis not present

## 2021-02-23 DIAGNOSIS — N186 End stage renal disease: Secondary | ICD-10-CM | POA: Diagnosis not present

## 2021-02-23 DIAGNOSIS — D509 Iron deficiency anemia, unspecified: Secondary | ICD-10-CM | POA: Diagnosis not present

## 2021-02-24 DIAGNOSIS — D509 Iron deficiency anemia, unspecified: Secondary | ICD-10-CM | POA: Diagnosis not present

## 2021-02-24 DIAGNOSIS — Z992 Dependence on renal dialysis: Secondary | ICD-10-CM | POA: Diagnosis not present

## 2021-02-24 DIAGNOSIS — N186 End stage renal disease: Secondary | ICD-10-CM | POA: Diagnosis not present

## 2021-02-24 DIAGNOSIS — N2581 Secondary hyperparathyroidism of renal origin: Secondary | ICD-10-CM | POA: Diagnosis not present

## 2021-02-25 DIAGNOSIS — N2581 Secondary hyperparathyroidism of renal origin: Secondary | ICD-10-CM | POA: Diagnosis not present

## 2021-02-25 DIAGNOSIS — D509 Iron deficiency anemia, unspecified: Secondary | ICD-10-CM | POA: Diagnosis not present

## 2021-02-25 DIAGNOSIS — N186 End stage renal disease: Secondary | ICD-10-CM | POA: Diagnosis not present

## 2021-02-25 DIAGNOSIS — Z992 Dependence on renal dialysis: Secondary | ICD-10-CM | POA: Diagnosis not present

## 2021-02-26 DIAGNOSIS — D509 Iron deficiency anemia, unspecified: Secondary | ICD-10-CM | POA: Diagnosis not present

## 2021-02-26 DIAGNOSIS — N186 End stage renal disease: Secondary | ICD-10-CM | POA: Diagnosis not present

## 2021-02-26 DIAGNOSIS — Z992 Dependence on renal dialysis: Secondary | ICD-10-CM | POA: Diagnosis not present

## 2021-02-26 DIAGNOSIS — N2581 Secondary hyperparathyroidism of renal origin: Secondary | ICD-10-CM | POA: Diagnosis not present

## 2021-02-27 DIAGNOSIS — D509 Iron deficiency anemia, unspecified: Secondary | ICD-10-CM | POA: Diagnosis not present

## 2021-02-27 DIAGNOSIS — N2581 Secondary hyperparathyroidism of renal origin: Secondary | ICD-10-CM | POA: Diagnosis not present

## 2021-02-27 DIAGNOSIS — Z992 Dependence on renal dialysis: Secondary | ICD-10-CM | POA: Diagnosis not present

## 2021-02-27 DIAGNOSIS — N186 End stage renal disease: Secondary | ICD-10-CM | POA: Diagnosis not present

## 2021-02-28 ENCOUNTER — Other Ambulatory Visit: Payer: Self-pay

## 2021-02-28 ENCOUNTER — Ambulatory Visit (HOSPITAL_COMMUNITY)
Admission: RE | Admit: 2021-02-28 | Discharge: 2021-02-28 | Disposition: A | Discharge status: Home / Self Care | Payer: Medicare Other | Source: Ambulatory Visit | Attending: Family Medicine | Admitting: Family Medicine

## 2021-02-28 DIAGNOSIS — N2581 Secondary hyperparathyroidism of renal origin: Secondary | ICD-10-CM | POA: Diagnosis not present

## 2021-02-28 DIAGNOSIS — D509 Iron deficiency anemia, unspecified: Secondary | ICD-10-CM | POA: Diagnosis not present

## 2021-02-28 DIAGNOSIS — I351 Nonrheumatic aortic (valve) insufficiency: Secondary | ICD-10-CM | POA: Insufficient documentation

## 2021-02-28 DIAGNOSIS — Z992 Dependence on renal dialysis: Secondary | ICD-10-CM | POA: Diagnosis not present

## 2021-02-28 DIAGNOSIS — N186 End stage renal disease: Secondary | ICD-10-CM | POA: Diagnosis not present

## 2021-02-28 LAB — ECHOCARDIOGRAM COMPLETE
Area-P 1/2: 4.21 cm2
S' Lateral: 3.5 cm

## 2021-02-28 NOTE — Progress Notes (Signed)
*  PRELIMINARY RESULTS* Echocardiogram 2D Echocardiogram has been performed.  Samuel Germany 02/28/2021, 10:16 AM

## 2021-03-01 DIAGNOSIS — D509 Iron deficiency anemia, unspecified: Secondary | ICD-10-CM | POA: Diagnosis not present

## 2021-03-01 DIAGNOSIS — N186 End stage renal disease: Secondary | ICD-10-CM | POA: Diagnosis not present

## 2021-03-01 DIAGNOSIS — N2581 Secondary hyperparathyroidism of renal origin: Secondary | ICD-10-CM | POA: Diagnosis not present

## 2021-03-01 DIAGNOSIS — Z992 Dependence on renal dialysis: Secondary | ICD-10-CM | POA: Diagnosis not present

## 2021-03-02 DIAGNOSIS — Z992 Dependence on renal dialysis: Secondary | ICD-10-CM | POA: Diagnosis not present

## 2021-03-02 DIAGNOSIS — N2581 Secondary hyperparathyroidism of renal origin: Secondary | ICD-10-CM | POA: Diagnosis not present

## 2021-03-02 DIAGNOSIS — D509 Iron deficiency anemia, unspecified: Secondary | ICD-10-CM | POA: Diagnosis not present

## 2021-03-02 DIAGNOSIS — N186 End stage renal disease: Secondary | ICD-10-CM | POA: Diagnosis not present

## 2021-03-03 DIAGNOSIS — N2581 Secondary hyperparathyroidism of renal origin: Secondary | ICD-10-CM | POA: Diagnosis not present

## 2021-03-03 DIAGNOSIS — D509 Iron deficiency anemia, unspecified: Secondary | ICD-10-CM | POA: Diagnosis not present

## 2021-03-03 DIAGNOSIS — Z992 Dependence on renal dialysis: Secondary | ICD-10-CM | POA: Diagnosis not present

## 2021-03-03 DIAGNOSIS — N186 End stage renal disease: Secondary | ICD-10-CM | POA: Diagnosis not present

## 2021-03-04 DIAGNOSIS — D509 Iron deficiency anemia, unspecified: Secondary | ICD-10-CM | POA: Diagnosis not present

## 2021-03-04 DIAGNOSIS — Z992 Dependence on renal dialysis: Secondary | ICD-10-CM | POA: Diagnosis not present

## 2021-03-04 DIAGNOSIS — N2581 Secondary hyperparathyroidism of renal origin: Secondary | ICD-10-CM | POA: Diagnosis not present

## 2021-03-04 DIAGNOSIS — N186 End stage renal disease: Secondary | ICD-10-CM | POA: Diagnosis not present

## 2021-03-05 DIAGNOSIS — Z992 Dependence on renal dialysis: Secondary | ICD-10-CM | POA: Diagnosis not present

## 2021-03-05 DIAGNOSIS — N2581 Secondary hyperparathyroidism of renal origin: Secondary | ICD-10-CM | POA: Diagnosis not present

## 2021-03-05 DIAGNOSIS — D509 Iron deficiency anemia, unspecified: Secondary | ICD-10-CM | POA: Diagnosis not present

## 2021-03-05 DIAGNOSIS — N186 End stage renal disease: Secondary | ICD-10-CM | POA: Diagnosis not present

## 2021-03-06 ENCOUNTER — Telehealth: Payer: Self-pay | Admitting: *Deleted

## 2021-03-06 DIAGNOSIS — N2581 Secondary hyperparathyroidism of renal origin: Secondary | ICD-10-CM | POA: Diagnosis not present

## 2021-03-06 DIAGNOSIS — N186 End stage renal disease: Secondary | ICD-10-CM | POA: Diagnosis not present

## 2021-03-06 DIAGNOSIS — Z992 Dependence on renal dialysis: Secondary | ICD-10-CM | POA: Diagnosis not present

## 2021-03-06 DIAGNOSIS — D509 Iron deficiency anemia, unspecified: Secondary | ICD-10-CM | POA: Diagnosis not present

## 2021-03-06 NOTE — Telephone Encounter (Signed)
Patient informed. Copy sent to PCP °

## 2021-03-06 NOTE — Telephone Encounter (Signed)
-----   Message from Merlene Laughter, RN sent at 03/06/2021  7:23 AM EDT -----  ----- Message ----- From: Verta Ellen., NP Sent: 02/28/2021   6:35 PM EDT To: Laurine Blazer, LPN  Please call the patient and let him know there have been no significant changes from the previous echocardiogram he had in August of 2021. The aortic valve shows a mild to moderate leak as before. The mitral valve has a mild leak as before. Let him know we will do an annual echo to keep an eye on the leaking aortic valve. Thank You  Verta Ellen, NP  02/28/2021 6:30 PM

## 2021-03-07 DIAGNOSIS — D509 Iron deficiency anemia, unspecified: Secondary | ICD-10-CM | POA: Diagnosis not present

## 2021-03-07 DIAGNOSIS — Z992 Dependence on renal dialysis: Secondary | ICD-10-CM | POA: Diagnosis not present

## 2021-03-07 DIAGNOSIS — N186 End stage renal disease: Secondary | ICD-10-CM | POA: Diagnosis not present

## 2021-03-07 DIAGNOSIS — N2581 Secondary hyperparathyroidism of renal origin: Secondary | ICD-10-CM | POA: Diagnosis not present

## 2021-03-08 DIAGNOSIS — N186 End stage renal disease: Secondary | ICD-10-CM | POA: Diagnosis not present

## 2021-03-08 DIAGNOSIS — N2581 Secondary hyperparathyroidism of renal origin: Secondary | ICD-10-CM | POA: Diagnosis not present

## 2021-03-08 DIAGNOSIS — D509 Iron deficiency anemia, unspecified: Secondary | ICD-10-CM | POA: Diagnosis not present

## 2021-03-08 DIAGNOSIS — Z992 Dependence on renal dialysis: Secondary | ICD-10-CM | POA: Diagnosis not present

## 2021-03-09 DIAGNOSIS — D509 Iron deficiency anemia, unspecified: Secondary | ICD-10-CM | POA: Diagnosis not present

## 2021-03-09 DIAGNOSIS — Z992 Dependence on renal dialysis: Secondary | ICD-10-CM | POA: Diagnosis not present

## 2021-03-09 DIAGNOSIS — N2581 Secondary hyperparathyroidism of renal origin: Secondary | ICD-10-CM | POA: Diagnosis not present

## 2021-03-09 DIAGNOSIS — N186 End stage renal disease: Secondary | ICD-10-CM | POA: Diagnosis not present

## 2021-03-10 DIAGNOSIS — Z992 Dependence on renal dialysis: Secondary | ICD-10-CM | POA: Diagnosis not present

## 2021-03-10 DIAGNOSIS — N186 End stage renal disease: Secondary | ICD-10-CM | POA: Diagnosis not present

## 2021-03-10 DIAGNOSIS — N2581 Secondary hyperparathyroidism of renal origin: Secondary | ICD-10-CM | POA: Diagnosis not present

## 2021-03-10 DIAGNOSIS — D509 Iron deficiency anemia, unspecified: Secondary | ICD-10-CM | POA: Diagnosis not present

## 2021-03-11 DIAGNOSIS — D509 Iron deficiency anemia, unspecified: Secondary | ICD-10-CM | POA: Diagnosis not present

## 2021-03-11 DIAGNOSIS — N2581 Secondary hyperparathyroidism of renal origin: Secondary | ICD-10-CM | POA: Diagnosis not present

## 2021-03-11 DIAGNOSIS — Z992 Dependence on renal dialysis: Secondary | ICD-10-CM | POA: Diagnosis not present

## 2021-03-11 DIAGNOSIS — N186 End stage renal disease: Secondary | ICD-10-CM | POA: Diagnosis not present

## 2021-03-12 DIAGNOSIS — N186 End stage renal disease: Secondary | ICD-10-CM | POA: Diagnosis not present

## 2021-03-12 DIAGNOSIS — N2581 Secondary hyperparathyroidism of renal origin: Secondary | ICD-10-CM | POA: Diagnosis not present

## 2021-03-12 DIAGNOSIS — Z992 Dependence on renal dialysis: Secondary | ICD-10-CM | POA: Diagnosis not present

## 2021-03-12 DIAGNOSIS — D509 Iron deficiency anemia, unspecified: Secondary | ICD-10-CM | POA: Diagnosis not present

## 2021-03-13 DIAGNOSIS — Z992 Dependence on renal dialysis: Secondary | ICD-10-CM | POA: Diagnosis not present

## 2021-03-13 DIAGNOSIS — R079 Chest pain, unspecified: Secondary | ICD-10-CM | POA: Diagnosis not present

## 2021-03-13 DIAGNOSIS — I517 Cardiomegaly: Secondary | ICD-10-CM | POA: Diagnosis not present

## 2021-03-13 DIAGNOSIS — D509 Iron deficiency anemia, unspecified: Secondary | ICD-10-CM | POA: Diagnosis not present

## 2021-03-13 DIAGNOSIS — N186 End stage renal disease: Secondary | ICD-10-CM | POA: Diagnosis not present

## 2021-03-13 DIAGNOSIS — R9431 Abnormal electrocardiogram [ECG] [EKG]: Secondary | ICD-10-CM | POA: Diagnosis not present

## 2021-03-13 DIAGNOSIS — I2510 Atherosclerotic heart disease of native coronary artery without angina pectoris: Secondary | ICD-10-CM | POA: Diagnosis not present

## 2021-03-13 DIAGNOSIS — Z87891 Personal history of nicotine dependence: Secondary | ICD-10-CM | POA: Diagnosis not present

## 2021-03-13 DIAGNOSIS — I4891 Unspecified atrial fibrillation: Secondary | ICD-10-CM | POA: Diagnosis not present

## 2021-03-13 DIAGNOSIS — R002 Palpitations: Secondary | ICD-10-CM | POA: Diagnosis not present

## 2021-03-13 DIAGNOSIS — N2581 Secondary hyperparathyroidism of renal origin: Secondary | ICD-10-CM | POA: Diagnosis not present

## 2021-03-13 DIAGNOSIS — I120 Hypertensive chronic kidney disease with stage 5 chronic kidney disease or end stage renal disease: Secondary | ICD-10-CM | POA: Diagnosis not present

## 2021-03-13 DIAGNOSIS — I231 Atrial septal defect as current complication following acute myocardial infarction: Secondary | ICD-10-CM | POA: Diagnosis not present

## 2021-03-14 DIAGNOSIS — D509 Iron deficiency anemia, unspecified: Secondary | ICD-10-CM | POA: Diagnosis not present

## 2021-03-14 DIAGNOSIS — Z992 Dependence on renal dialysis: Secondary | ICD-10-CM | POA: Diagnosis not present

## 2021-03-14 DIAGNOSIS — I4891 Unspecified atrial fibrillation: Secondary | ICD-10-CM | POA: Diagnosis present

## 2021-03-14 DIAGNOSIS — N2581 Secondary hyperparathyroidism of renal origin: Secondary | ICD-10-CM | POA: Diagnosis not present

## 2021-03-14 DIAGNOSIS — N186 End stage renal disease: Secondary | ICD-10-CM | POA: Diagnosis not present

## 2021-03-15 DIAGNOSIS — Z992 Dependence on renal dialysis: Secondary | ICD-10-CM | POA: Diagnosis not present

## 2021-03-15 DIAGNOSIS — D509 Iron deficiency anemia, unspecified: Secondary | ICD-10-CM | POA: Diagnosis not present

## 2021-03-15 DIAGNOSIS — N2581 Secondary hyperparathyroidism of renal origin: Secondary | ICD-10-CM | POA: Diagnosis not present

## 2021-03-15 DIAGNOSIS — N186 End stage renal disease: Secondary | ICD-10-CM | POA: Diagnosis not present

## 2021-03-15 NOTE — Progress Notes (Addendum)
Cardiology Office Note  Date: 03/16/2021   ID: Rodney Gordon, DOB [birth year], MRN [id number]  PCP:  Neale Burly, MD  Cardiologist:  Rozann Lesches, MD Electrophysiologist:  None   Chief Complaint: 4-week follow-up  History of Present Illness: Rodney Gordon is a 65 y.o. male with a history of aortic valve insufficiency (mild to moderate), CAD nonobstructive by cardiac catheterization 2004.  ESRD on PD, HTN, PVD.  Last encounter with Dr. Domenic Polite 02/23/2019 via telemedicine: He did not report any major changes in terms of cardiac symptoms over the prior year has stable NYHA class II dyspnea.  No recurrent chest pain or palpitations.  He was undergoing PD and followed up with nephrology.  Recent echo LVEF 60 to 65% with moderate aortic regurgitation.  Blood pressure was well controlled.  There were no changes made to current regimen.  He was last here for 60-monthfollow-up.  He stated over the past few months his heart rate had been increasing when he was doing more than usual ADLs.  He stated sometimes his heart rate would reach up to the 140 bpm range.  He stated his blood pressures had also been low at times when he uses different concentrated solutions in his PD regimen.  He stated when he used 2.5% solution in dialysate his blood pressure would sometimes drop to the [id number].  He stated he had spoken to nephrology who has advised him to use 1.5% solution when this occurs.  He denied any anginal symptoms.  Blood pressure was 138/86.  Denied any anginal symptoms.  Denied any DOE or SOB.  No orthostatic symptoms, CVA or TIA-like symptoms, PND, orthopnea, bleeding.  Denied any claudication-like symptoms, DVT or PE-like symptoms.  No lower extremity edema noted.  [facility] to elevated heart rate.  He noticed some palpitations without chest pain or shortness of breath.  He is noted to be in A. fib with RVR.   Plan was IV fluids diltiazem and diltiazem drip.  He was transitioned to p.o. Cardizem.  His symptoms improved considerably.  Heart rate was 75 atrial fibrillation with good rate control.  He was prescribed as needed diltiazem to supplement his regular dosing keeping appointment with cardiology.  Serial troponin showed no delta.  He denies any more episodes of A. fib with RVR.  At discharge from emergency room he was giving supplemental short acting diltiazem 60 mg by mouth 4 times daily as needed for rapid heart rate.  He continues on; diltiazem 120 mg daily, diltiazem 60 mg p.o. as needed 4 times daily rapid heart rate, Tenoretic 50/25 mg daily, Lasix 80 mg p.o. twice daily, Imdur 30 mg daily, losartan 50 mg daily.  Sublingual nitroglycerin as needed, potassium supplementation 20 mEq 3 times daily.  Currently denies any palpitations or arrhythmias.  Denies any anginal or exertional symptoms orthostatic symptoms, or TIA-like symptoms.  Denies any bleeding on anticoagulation.  Denies any claudication-like symptoms, or lower extremity edema.   Past Medical History:  Diagnosis Date   Anemia of chronic disease    Aortic insufficiency    Mild to moderate   Coronary atherosclerosis of native coronary artery    Nonobstructive at catheterization 2004   ESRD on hemodialysis ([facility]    Dr. BLowanda Foster  Essential hypertension    Peripheral vascular disease ([facility]     Past Surgical History:  Procedure Laterality Date   A/V FISTULAGRAM  Right 09/05/2017   Procedure: A/V FISTULAGRAM;  Surgeon: Angelia Mould, MD;  Location: [place] [facility];  Service: Cardiovascular;  Laterality: Right;   AV FISTULA PLACEMENT Right 12/17/2016   Procedure: ARTERIOVENOUS (AV) FISTULA CREATION- RIGHT BRACHIOCEPHALIC;  Surgeon: Elam Dutch, MD;  Location: [facility];  Service: Vascular;  Laterality: Right;   CAPD INSERTION N/A 11/03/2017   Procedure: LAPAROSCOPIC INSERTION CONTINUOUS AMBULATORY PERITONEAL DIALYSIS  (CAPD)  CATHETER;  Surgeon: Clovis Riley, MD;  Location: [facility];  Service: General;  Laterality: N/A;   CARDIAC CATHETERIZATION     at [facility]   IR DIALY SHUNT INTRO [place] W/IMG RIGHT Right 04/24/2017   IR US GUIDE VASC ACCESS RIGHT  04/24/2017   PERIPHERAL VASCULAR BALLOON ANGIOPLASTY Right 09/05/2017   Procedure: PERIPHERAL VASCULAR BALLOON ANGIOPLASTY;  Surgeon: Angelia Mould, MD;  Location: [place] [facility];  Service: Cardiovascular;  Laterality: Right;   PROSTATE SURGERY     TIBIA FRACTURE SURGERY     Left    Current Outpatient Medications  Medication Sig Dispense Refill   acetaminophen (TYLENOL) 500 MG tablet Take 1,000 mg by mouth 2 (two) times daily as needed for mild pain.     acetaminophen (TYLENOL) 650 MG CR tablet Take 650 mg by mouth daily as needed for pain.     apixaban (ELIQUIS) 5 MG TABS tablet Take 1 tablet (5 mg total) by mouth 2 (two) times daily. 60 tablet 11   atenolol-chlorthalidone (TENORETIC) 50-25 MG tablet Take 1 tablet by mouth daily.     calcium acetate (PHOSLO) 667 MG capsule Take 667 mg by mouth 3 (three) times daily with meals.     diltiazem (CARDIZEM CD) 120 MG 24 hr capsule Take 1 capsule (120 mg total) by mouth daily. 90 capsule 3   diltiazem (CARDIZEM) 60 MG tablet Take 60 mg by mouth 4 (four) times daily as needed (for rapid hr).     diphenhydramine-acetaminophen (TYLENOL PM) 25-500 MG TABS tablet Take 1 tablet by mouth 2 (two) times daily as needed (PAIN).     doxazosin (CARDURA) 8 MG tablet Take 8 mg by mouth daily.     furosemide (LASIX) 80 MG tablet Take 80 mg by mouth 2 (two) times daily.     isosorbide mononitrate (IMDUR) 30 MG 24 hr tablet Take 30 mg by mouth daily.     lidocaine-prilocaine (EMLA) cream Apply 1 application topically as needed (prior to dialysis).      losartan (COZAAR) 50 MG tablet Take 50 mg by mouth daily.     methocarbamol (ROBAXIN) 500 MG tablet Take 500 mg by mouth at bedtime.     minoxidil (LONITEN) 2.5  MG tablet Take 5 mg by mouth daily.     multivitamin (RENA-VIT) TABS tablet Take 1 tablet by mouth daily.     nitroGLYCERIN (NITROSTAT) 0.4 MG SL tablet Place 1 tablet (0.4 mg total) under the tongue as directed. (Patient taking differently: Place 0.4 mg under the tongue every 5 (five) minutes as needed for chest pain.) 25 tablet 2   oxyCODONE-acetaminophen (PERCOCET/ROXICET) 5-325 MG tablet Take 1 tablet by mouth every 6 (six) hours as needed for severe pain. 20 tablet 0   potassium chloride SA (K-DUR) 20 MEQ tablet Take 20 mEq by mouth 3 (three) times daily.     sildenafil (VIAGRA) 50 MG tablet Take 50 mg by mouth daily as needed.     sodium bicarbonate 650 MG tablet Take 1,300 mg by mouth 3 (three) times daily.  tamsulosin (FLOMAX) 0.4 MG CAPS capsule Take 1 capsule by mouth daily.     triamcinolone cream (KENALOG) 0.1 % Apply topically daily.     No current facility-administered medications for this visit.   Allergies:  Patient has no known allergies.   Social History: The patient  reports that he quit smoking about 22 years ago. His smoking use included cigarettes. He started smoking about 39 years ago. He has a 25.00 pack-year smoking history. He has never used smokeless tobacco. He reports that he does not drink alcohol and does not use drugs.   Family History: The patient's family history includes Cancer in an other family member; Coronary artery disease in an other family member.   ROS:  Please see the history of present illness. Otherwise, complete review of systems is positive for none.  All other systems are reviewed and negative.   Physical Exam: VS:  BP 132/78   Pulse 64   Ht '5\' 9"'$  (1.753 m)   Wt 171 lb (77.6 kg)   SpO2 99%   BMI 25.25 kg/m , BMI Body mass index is 25.25 kg/m.  Wt Readings from Last 3 Encounters:  03/16/21 171 lb (77.6 kg)  02/13/21 166 lb (75.3 kg)  04/28/20 167 lb 9.6 oz (76 kg)    General: Patient appears comfortable at rest. Neck: Supple, no  elevated JVP or carotid bruits, no thyromegaly. Lungs: Clear to auscultation, nonlabored breathing at rest. Cardiac: Irregularly irregular rate and rhythm, no S3 or significant systolic murmur, no pericardial rub. Abdomen: Soft, nontender, PD catheter present. Extremities: No pitting edema, distal pulses 2+. Skin: Warm and dry. Musculoskeletal: No kyphosis. Neuropsychiatric: Alert and oriented x3, affect grossly appropriate.  ECG: EKG 02/13/2021 atrial fibrillation with RVR with premature ventricular aberrantly conducted complexes rate 124.  Nonspecific ST and T wave abnormality  Recent Labwork: 02/16/2021: BUN 21; Creatinine, Ser 11.85; Hemoglobin 13.0; Platelets 263; Potassium 2.8; Sodium 136  No results found for: CHOL, TRIG, HDL, CHOLHDL, VLDL, LDLCALC, LDLDIRECT  Other Studies Reviewed Today:  Echocardiogram 02/28/2021   1. No significant change from previous echo images of Aug 2021.   2. Hypokinesis/akinesis of the basal inferior, inferoseptal and  inferolateral walls . Left ventricular ejection fraction, by estimation,  is 55 to 60%. The left ventricle has normal function. There is severe  asymmetric left ventricular hypertrophy. Left  ventricular diastolic parameters are indeterminate.   3. Right ventricular systolic function is normal. The right ventricular  size is normal. There is normal pulmonary artery systolic pressure.   4. Left atrial size was mildly dilated.   5. Right atrial size was mildly dilated.   6. Very mild systolic anterior motion of the mitral valve. Mild mitral  valve regurgitation.   7. The aortic valve is tricuspid. Aortic valve regurgitation is mild to  moderate. Mild to moderate aortic valve sclerosis/calcification is  present, without any evidence of aortic stenosis.   8. The inferior vena cava is dilated in size with >50% respiratory  variability, suggesting right atrial pressure of 8 mmHg.   Echocardiogram 03/14/2020 1. Left ventricular ejection  fraction, by estimation, is 70 to 75%. The left ventricle has hyperdynamic function. The left ventricle has no regional wall motion abnormalities. There is mild left ventricular hypertrophy. Left ventricular diastolic parameters are consistent with Grade I diastolic dysfunction (impaired relaxation). 2. Right ventricular systolic function is normal. The right ventricular size is normal. There is normal pulmonary artery systolic pressure. The estimated right ventricular systolic pressure is [id number] mmHg.  3. The mitral valve is grossly normal. Trivial mitral valve regurgitation. 4. The aortic valve is tricuspid, mildly calcified with decreased left coronary cusp motion but no stenosis. Aortic valve regurgitation is moderate. 5. The inferior vena cava is normal in size with greater than 50% respiratory variability, suggesting right atrial pressure of 3 mmHg.    Echocardiogram 02/04/2019:  1. The left ventricle has normal systolic function with an ejection fraction of 60-65%. The cavity size was normal. Left ventricular diastolic Doppler parameters are consistent with impaired relaxation. Elevated left ventricular end-diastolic pressure  No evidence of left ventricular regional wall motion abnormalities.  2. The right ventricle has low normal systolic function. The cavity was normal. There is mildly increased right ventricular wall thickness.  3. Left atrial size was mildly dilated.  4. The aortic valve is tricuspid. Mild thickening of the aortic valve. Aortic valve regurgitation is moderate by color flow Doppler. Moderate aortic annular calcification noted.  5. The mitral valve is grossly normal. Mitral valve regurgitation is moderate by color flow Doppler.  6. The tricuspid valve is grossly normal.  7. The aortic root is normal in size and structure.  Assessment and Plan:  1. New onset atrial fibrillation ([place])   2. Aortic valve insufficiency, etiology of cardiac valve disease unspecified   3.  ESRD (end stage renal disease) ([place])   4. Essential hypertension   5. CAD in native artery     1.  Atrial fibrillation RVR At prior visit he was discovered to be in new onset atrial fibrillation.  He was started on diltiazem 120 mg daily along with Eliquis 5 mg p.o. daily.  Recently had a visit to the emergency room 03/13/2021 [facility] with rapid heart rate/palpitations.  He was in atrial fibrillation with RVR.  He was started on a Cardizem drip and transitioned to p.o. Cardizem.  He was given as needed Cardizem 60 mg 4 times daily for rapid heart rate.  He denies any further episodes of palpitations today.  Denies any bleeding on Eliquis.  Continue Eliquis 5 mg p.o. daily.  He has taken his Eliquis uninterrupted since last visit.  Will send this note to primary cardiologist Dr. Domenic Polite to see if he would like to schedule patient for DC cardioversion.  Addendum 03/19/2021.  Spoke with patient by phone regarding DC cardioversion and the procedure risk and benefits.  Patient elects not to undergo cardioversion at this time.  He prefers rate control strategy.  2. Aortic valve insufficiency, etiology of cardiac valve disease unspecified  At last visit he stated he had some mild dyspnea on moderate exertion but quickly recovered in a few minutes.  Please Recent repeat echocardiogram showed no significant change from previous echo images of August 2021 hypokinesis/akinesis of basal inferior, inferior septal and inferior lateral walls.  EF 55 to 60%.  Severe 80 symmetric LVH.  LA mildly dilated, RA mildly dilated, very mild systolic anterior motion of the mitral valve.  Mild mitral regurgitation   3. ESRD on peritoneal dialysis  Patient states his dialysis has been going well. No recent issues with PD catheter.  Follows with nephrology.  4. Essential hypertension BP today 132/78.  Continue atenolol / chlorthalidone 50/25 mg p.o. daily.  Continue Lasix 80 mg p.o. daily.  Continue losartan 50 mg p.o.  daily   5.  CAD. Denies any anginal symptoms.  Does have some mild dyspnea on exertion. Continue aspirin 81 mg, Imdur 30 mg daily, nitroglycerin sublingual as needed for chest pain.  Medication Adjustments/Labs and Tests Ordered: Current medicines are reviewed at length with the patient today.  Concerns regarding medicines are outlined above.   Disposition: Follow-up with Dr. Domenic Polite or APP 6 months Signed, Levell July, NP 03/16/2021 9:28 AM    [facility] at [place], [place], [place] [address] Phone: [phone]; Fax: [phone]

## 2021-03-16 ENCOUNTER — Encounter: Payer: Self-pay | Admitting: Family Medicine

## 2021-03-16 ENCOUNTER — Other Ambulatory Visit: Payer: Self-pay

## 2021-03-16 ENCOUNTER — Ambulatory Visit (INDEPENDENT_AMBULATORY_CARE_PROVIDER_SITE_OTHER): Payer: Medicare Other | Admitting: Family Medicine

## 2021-03-16 VITALS — BP 132/78 | HR 64 | Ht 69.0 in | Wt 171.0 lb

## 2021-03-16 DIAGNOSIS — N2581 Secondary hyperparathyroidism of renal origin: Secondary | ICD-10-CM | POA: Diagnosis not present

## 2021-03-16 DIAGNOSIS — I2510 Atherosclerotic heart disease of native coronary artery without angina pectoris: Secondary | ICD-10-CM

## 2021-03-16 DIAGNOSIS — N186 End stage renal disease: Secondary | ICD-10-CM | POA: Diagnosis not present

## 2021-03-16 DIAGNOSIS — I4891 Unspecified atrial fibrillation: Secondary | ICD-10-CM | POA: Diagnosis not present

## 2021-03-16 DIAGNOSIS — I10 Essential (primary) hypertension: Secondary | ICD-10-CM | POA: Diagnosis not present

## 2021-03-16 DIAGNOSIS — I351 Nonrheumatic aortic (valve) insufficiency: Secondary | ICD-10-CM | POA: Diagnosis not present

## 2021-03-16 DIAGNOSIS — D509 Iron deficiency anemia, unspecified: Secondary | ICD-10-CM | POA: Diagnosis not present

## 2021-03-16 DIAGNOSIS — Z992 Dependence on renal dialysis: Secondary | ICD-10-CM | POA: Diagnosis not present

## 2021-03-16 NOTE — Patient Instructions (Addendum)
Medication Instructions:  Continue all current medications.   Labwork: none  Testing/Procedures: none  Follow-Up: 6 months   Any Other Special Instructions Will Be Listed Below (If Applicable).   If you need a refill on your cardiac medications before your next appointment, please call your pharmacy.  

## 2021-03-17 DIAGNOSIS — N186 End stage renal disease: Secondary | ICD-10-CM | POA: Diagnosis not present

## 2021-03-17 DIAGNOSIS — N2581 Secondary hyperparathyroidism of renal origin: Secondary | ICD-10-CM | POA: Diagnosis not present

## 2021-03-17 DIAGNOSIS — Z992 Dependence on renal dialysis: Secondary | ICD-10-CM | POA: Diagnosis not present

## 2021-03-17 DIAGNOSIS — D509 Iron deficiency anemia, unspecified: Secondary | ICD-10-CM | POA: Diagnosis not present

## 2021-03-18 DIAGNOSIS — D509 Iron deficiency anemia, unspecified: Secondary | ICD-10-CM | POA: Diagnosis not present

## 2021-03-18 DIAGNOSIS — Z992 Dependence on renal dialysis: Secondary | ICD-10-CM | POA: Diagnosis not present

## 2021-03-18 DIAGNOSIS — N2581 Secondary hyperparathyroidism of renal origin: Secondary | ICD-10-CM | POA: Diagnosis not present

## 2021-03-18 DIAGNOSIS — N186 End stage renal disease: Secondary | ICD-10-CM | POA: Diagnosis not present

## 2021-03-19 ENCOUNTER — Encounter: Payer: Self-pay | Admitting: Family Medicine

## 2021-03-19 DIAGNOSIS — N2581 Secondary hyperparathyroidism of renal origin: Secondary | ICD-10-CM | POA: Diagnosis not present

## 2021-03-19 DIAGNOSIS — N186 End stage renal disease: Secondary | ICD-10-CM | POA: Diagnosis not present

## 2021-03-19 DIAGNOSIS — D509 Iron deficiency anemia, unspecified: Secondary | ICD-10-CM | POA: Diagnosis not present

## 2021-03-19 DIAGNOSIS — Z992 Dependence on renal dialysis: Secondary | ICD-10-CM | POA: Diagnosis not present

## 2021-03-20 DIAGNOSIS — D509 Iron deficiency anemia, unspecified: Secondary | ICD-10-CM | POA: Diagnosis not present

## 2021-03-20 DIAGNOSIS — Z992 Dependence on renal dialysis: Secondary | ICD-10-CM | POA: Diagnosis not present

## 2021-03-20 DIAGNOSIS — N186 End stage renal disease: Secondary | ICD-10-CM | POA: Diagnosis not present

## 2021-03-20 DIAGNOSIS — N2581 Secondary hyperparathyroidism of renal origin: Secondary | ICD-10-CM | POA: Diagnosis not present

## 2021-03-21 DIAGNOSIS — D509 Iron deficiency anemia, unspecified: Secondary | ICD-10-CM | POA: Diagnosis not present

## 2021-03-21 DIAGNOSIS — Z992 Dependence on renal dialysis: Secondary | ICD-10-CM | POA: Diagnosis not present

## 2021-03-21 DIAGNOSIS — N186 End stage renal disease: Secondary | ICD-10-CM | POA: Diagnosis not present

## 2021-03-21 DIAGNOSIS — N2581 Secondary hyperparathyroidism of renal origin: Secondary | ICD-10-CM | POA: Diagnosis not present

## 2021-03-22 DIAGNOSIS — R109 Unspecified abdominal pain: Secondary | ICD-10-CM | POA: Diagnosis not present

## 2021-03-22 DIAGNOSIS — R110 Nausea: Secondary | ICD-10-CM | POA: Diagnosis not present

## 2021-03-22 DIAGNOSIS — D509 Iron deficiency anemia, unspecified: Secondary | ICD-10-CM | POA: Diagnosis not present

## 2021-03-22 DIAGNOSIS — Z992 Dependence on renal dialysis: Secondary | ICD-10-CM | POA: Diagnosis not present

## 2021-03-22 DIAGNOSIS — N186 End stage renal disease: Secondary | ICD-10-CM | POA: Diagnosis not present

## 2021-03-22 DIAGNOSIS — N2581 Secondary hyperparathyroidism of renal origin: Secondary | ICD-10-CM | POA: Diagnosis not present

## 2021-03-22 DIAGNOSIS — R880 Cloudy (hemodialysis) (peritoneal) dialysis effluent: Secondary | ICD-10-CM | POA: Diagnosis not present

## 2021-03-23 DIAGNOSIS — N186 End stage renal disease: Secondary | ICD-10-CM | POA: Diagnosis not present

## 2021-03-23 DIAGNOSIS — Z992 Dependence on renal dialysis: Secondary | ICD-10-CM | POA: Diagnosis not present

## 2021-03-23 DIAGNOSIS — N2581 Secondary hyperparathyroidism of renal origin: Secondary | ICD-10-CM | POA: Diagnosis not present

## 2021-03-23 DIAGNOSIS — R110 Nausea: Secondary | ICD-10-CM | POA: Diagnosis not present

## 2021-03-23 DIAGNOSIS — D509 Iron deficiency anemia, unspecified: Secondary | ICD-10-CM | POA: Diagnosis not present

## 2021-03-23 DIAGNOSIS — R109 Unspecified abdominal pain: Secondary | ICD-10-CM | POA: Diagnosis not present

## 2021-03-24 DIAGNOSIS — N186 End stage renal disease: Secondary | ICD-10-CM | POA: Diagnosis not present

## 2021-03-24 DIAGNOSIS — R110 Nausea: Secondary | ICD-10-CM | POA: Diagnosis not present

## 2021-03-24 DIAGNOSIS — N2581 Secondary hyperparathyroidism of renal origin: Secondary | ICD-10-CM | POA: Diagnosis not present

## 2021-03-24 DIAGNOSIS — Z992 Dependence on renal dialysis: Secondary | ICD-10-CM | POA: Diagnosis not present

## 2021-03-24 DIAGNOSIS — D509 Iron deficiency anemia, unspecified: Secondary | ICD-10-CM | POA: Diagnosis not present

## 2021-03-24 DIAGNOSIS — R109 Unspecified abdominal pain: Secondary | ICD-10-CM | POA: Diagnosis not present

## 2021-03-25 DIAGNOSIS — Z992 Dependence on renal dialysis: Secondary | ICD-10-CM | POA: Diagnosis not present

## 2021-03-25 DIAGNOSIS — R109 Unspecified abdominal pain: Secondary | ICD-10-CM | POA: Diagnosis not present

## 2021-03-25 DIAGNOSIS — N186 End stage renal disease: Secondary | ICD-10-CM | POA: Diagnosis not present

## 2021-03-25 DIAGNOSIS — D509 Iron deficiency anemia, unspecified: Secondary | ICD-10-CM | POA: Diagnosis not present

## 2021-03-25 DIAGNOSIS — N2581 Secondary hyperparathyroidism of renal origin: Secondary | ICD-10-CM | POA: Diagnosis not present

## 2021-03-25 DIAGNOSIS — R110 Nausea: Secondary | ICD-10-CM | POA: Diagnosis not present

## 2021-03-26 DIAGNOSIS — R110 Nausea: Secondary | ICD-10-CM | POA: Diagnosis not present

## 2021-03-26 DIAGNOSIS — R109 Unspecified abdominal pain: Secondary | ICD-10-CM | POA: Diagnosis not present

## 2021-03-26 DIAGNOSIS — Z992 Dependence on renal dialysis: Secondary | ICD-10-CM | POA: Diagnosis not present

## 2021-03-26 DIAGNOSIS — D509 Iron deficiency anemia, unspecified: Secondary | ICD-10-CM | POA: Diagnosis not present

## 2021-03-26 DIAGNOSIS — N2581 Secondary hyperparathyroidism of renal origin: Secondary | ICD-10-CM | POA: Diagnosis not present

## 2021-03-26 DIAGNOSIS — N186 End stage renal disease: Secondary | ICD-10-CM | POA: Diagnosis not present

## 2021-03-27 DIAGNOSIS — Z992 Dependence on renal dialysis: Secondary | ICD-10-CM | POA: Diagnosis not present

## 2021-03-27 DIAGNOSIS — N186 End stage renal disease: Secondary | ICD-10-CM | POA: Diagnosis not present

## 2021-03-27 DIAGNOSIS — R110 Nausea: Secondary | ICD-10-CM | POA: Diagnosis not present

## 2021-03-27 DIAGNOSIS — D509 Iron deficiency anemia, unspecified: Secondary | ICD-10-CM | POA: Diagnosis not present

## 2021-03-27 DIAGNOSIS — N2581 Secondary hyperparathyroidism of renal origin: Secondary | ICD-10-CM | POA: Diagnosis not present

## 2021-03-27 DIAGNOSIS — R109 Unspecified abdominal pain: Secondary | ICD-10-CM | POA: Diagnosis not present

## 2021-03-28 DIAGNOSIS — N186 End stage renal disease: Secondary | ICD-10-CM | POA: Diagnosis not present

## 2021-03-28 DIAGNOSIS — Z992 Dependence on renal dialysis: Secondary | ICD-10-CM | POA: Diagnosis not present

## 2021-03-28 DIAGNOSIS — R109 Unspecified abdominal pain: Secondary | ICD-10-CM | POA: Diagnosis not present

## 2021-03-28 DIAGNOSIS — N2581 Secondary hyperparathyroidism of renal origin: Secondary | ICD-10-CM | POA: Diagnosis not present

## 2021-03-28 DIAGNOSIS — R110 Nausea: Secondary | ICD-10-CM | POA: Diagnosis not present

## 2021-03-28 DIAGNOSIS — D509 Iron deficiency anemia, unspecified: Secondary | ICD-10-CM | POA: Diagnosis not present

## 2021-03-29 DIAGNOSIS — Z992 Dependence on renal dialysis: Secondary | ICD-10-CM | POA: Diagnosis not present

## 2021-03-29 DIAGNOSIS — N186 End stage renal disease: Secondary | ICD-10-CM | POA: Diagnosis not present

## 2021-03-29 DIAGNOSIS — D509 Iron deficiency anemia, unspecified: Secondary | ICD-10-CM | POA: Diagnosis not present

## 2021-03-29 DIAGNOSIS — R110 Nausea: Secondary | ICD-10-CM | POA: Diagnosis not present

## 2021-03-29 DIAGNOSIS — R109 Unspecified abdominal pain: Secondary | ICD-10-CM | POA: Diagnosis not present

## 2021-03-29 DIAGNOSIS — N2581 Secondary hyperparathyroidism of renal origin: Secondary | ICD-10-CM | POA: Diagnosis not present

## 2021-03-30 DIAGNOSIS — N186 End stage renal disease: Secondary | ICD-10-CM | POA: Diagnosis not present

## 2021-03-30 DIAGNOSIS — R110 Nausea: Secondary | ICD-10-CM | POA: Diagnosis not present

## 2021-03-30 DIAGNOSIS — N2581 Secondary hyperparathyroidism of renal origin: Secondary | ICD-10-CM | POA: Diagnosis not present

## 2021-03-30 DIAGNOSIS — R109 Unspecified abdominal pain: Secondary | ICD-10-CM | POA: Diagnosis not present

## 2021-03-30 DIAGNOSIS — D509 Iron deficiency anemia, unspecified: Secondary | ICD-10-CM | POA: Diagnosis not present

## 2021-03-30 DIAGNOSIS — Z992 Dependence on renal dialysis: Secondary | ICD-10-CM | POA: Diagnosis not present

## 2021-03-31 DIAGNOSIS — D509 Iron deficiency anemia, unspecified: Secondary | ICD-10-CM | POA: Diagnosis not present

## 2021-03-31 DIAGNOSIS — N2581 Secondary hyperparathyroidism of renal origin: Secondary | ICD-10-CM | POA: Diagnosis not present

## 2021-03-31 DIAGNOSIS — N186 End stage renal disease: Secondary | ICD-10-CM | POA: Diagnosis not present

## 2021-03-31 DIAGNOSIS — Z992 Dependence on renal dialysis: Secondary | ICD-10-CM | POA: Diagnosis not present

## 2021-03-31 DIAGNOSIS — R110 Nausea: Secondary | ICD-10-CM | POA: Diagnosis not present

## 2021-03-31 DIAGNOSIS — R109 Unspecified abdominal pain: Secondary | ICD-10-CM | POA: Diagnosis not present

## 2021-04-01 DIAGNOSIS — D509 Iron deficiency anemia, unspecified: Secondary | ICD-10-CM | POA: Diagnosis not present

## 2021-04-01 DIAGNOSIS — Z992 Dependence on renal dialysis: Secondary | ICD-10-CM | POA: Diagnosis not present

## 2021-04-01 DIAGNOSIS — R109 Unspecified abdominal pain: Secondary | ICD-10-CM | POA: Diagnosis not present

## 2021-04-01 DIAGNOSIS — N186 End stage renal disease: Secondary | ICD-10-CM | POA: Diagnosis not present

## 2021-04-01 DIAGNOSIS — N2581 Secondary hyperparathyroidism of renal origin: Secondary | ICD-10-CM | POA: Diagnosis not present

## 2021-04-01 DIAGNOSIS — R110 Nausea: Secondary | ICD-10-CM | POA: Diagnosis not present

## 2021-04-02 DIAGNOSIS — N186 End stage renal disease: Secondary | ICD-10-CM | POA: Diagnosis not present

## 2021-04-02 DIAGNOSIS — D509 Iron deficiency anemia, unspecified: Secondary | ICD-10-CM | POA: Diagnosis not present

## 2021-04-02 DIAGNOSIS — N2581 Secondary hyperparathyroidism of renal origin: Secondary | ICD-10-CM | POA: Diagnosis not present

## 2021-04-02 DIAGNOSIS — R109 Unspecified abdominal pain: Secondary | ICD-10-CM | POA: Diagnosis not present

## 2021-04-02 DIAGNOSIS — R110 Nausea: Secondary | ICD-10-CM | POA: Diagnosis not present

## 2021-04-02 DIAGNOSIS — Z992 Dependence on renal dialysis: Secondary | ICD-10-CM | POA: Diagnosis not present

## 2021-04-03 DIAGNOSIS — R110 Nausea: Secondary | ICD-10-CM | POA: Diagnosis not present

## 2021-04-03 DIAGNOSIS — D509 Iron deficiency anemia, unspecified: Secondary | ICD-10-CM | POA: Diagnosis not present

## 2021-04-03 DIAGNOSIS — N186 End stage renal disease: Secondary | ICD-10-CM | POA: Diagnosis not present

## 2021-04-03 DIAGNOSIS — Z992 Dependence on renal dialysis: Secondary | ICD-10-CM | POA: Diagnosis not present

## 2021-04-03 DIAGNOSIS — R109 Unspecified abdominal pain: Secondary | ICD-10-CM | POA: Diagnosis not present

## 2021-04-03 DIAGNOSIS — N2581 Secondary hyperparathyroidism of renal origin: Secondary | ICD-10-CM | POA: Diagnosis not present

## 2021-04-04 DIAGNOSIS — R110 Nausea: Secondary | ICD-10-CM | POA: Diagnosis not present

## 2021-04-04 DIAGNOSIS — D509 Iron deficiency anemia, unspecified: Secondary | ICD-10-CM | POA: Diagnosis not present

## 2021-04-04 DIAGNOSIS — N2581 Secondary hyperparathyroidism of renal origin: Secondary | ICD-10-CM | POA: Diagnosis not present

## 2021-04-04 DIAGNOSIS — R109 Unspecified abdominal pain: Secondary | ICD-10-CM | POA: Diagnosis not present

## 2021-04-04 DIAGNOSIS — Z992 Dependence on renal dialysis: Secondary | ICD-10-CM | POA: Diagnosis not present

## 2021-04-04 DIAGNOSIS — N186 End stage renal disease: Secondary | ICD-10-CM | POA: Diagnosis not present

## 2021-04-05 DIAGNOSIS — N2581 Secondary hyperparathyroidism of renal origin: Secondary | ICD-10-CM | POA: Diagnosis not present

## 2021-04-05 DIAGNOSIS — Z992 Dependence on renal dialysis: Secondary | ICD-10-CM | POA: Diagnosis not present

## 2021-04-05 DIAGNOSIS — D509 Iron deficiency anemia, unspecified: Secondary | ICD-10-CM | POA: Diagnosis not present

## 2021-04-05 DIAGNOSIS — R110 Nausea: Secondary | ICD-10-CM | POA: Diagnosis not present

## 2021-04-05 DIAGNOSIS — N186 End stage renal disease: Secondary | ICD-10-CM | POA: Diagnosis not present

## 2021-04-05 DIAGNOSIS — R109 Unspecified abdominal pain: Secondary | ICD-10-CM | POA: Diagnosis not present

## 2021-04-06 DIAGNOSIS — N2581 Secondary hyperparathyroidism of renal origin: Secondary | ICD-10-CM | POA: Diagnosis not present

## 2021-04-06 DIAGNOSIS — D509 Iron deficiency anemia, unspecified: Secondary | ICD-10-CM | POA: Diagnosis not present

## 2021-04-06 DIAGNOSIS — R109 Unspecified abdominal pain: Secondary | ICD-10-CM | POA: Diagnosis not present

## 2021-04-06 DIAGNOSIS — N186 End stage renal disease: Secondary | ICD-10-CM | POA: Diagnosis not present

## 2021-04-06 DIAGNOSIS — Z992 Dependence on renal dialysis: Secondary | ICD-10-CM | POA: Diagnosis not present

## 2021-04-06 DIAGNOSIS — R110 Nausea: Secondary | ICD-10-CM | POA: Diagnosis not present

## 2021-04-07 DIAGNOSIS — D509 Iron deficiency anemia, unspecified: Secondary | ICD-10-CM | POA: Diagnosis not present

## 2021-04-07 DIAGNOSIS — R109 Unspecified abdominal pain: Secondary | ICD-10-CM | POA: Diagnosis not present

## 2021-04-07 DIAGNOSIS — N186 End stage renal disease: Secondary | ICD-10-CM | POA: Diagnosis not present

## 2021-04-07 DIAGNOSIS — Z992 Dependence on renal dialysis: Secondary | ICD-10-CM | POA: Diagnosis not present

## 2021-04-07 DIAGNOSIS — R110 Nausea: Secondary | ICD-10-CM | POA: Diagnosis not present

## 2021-04-07 DIAGNOSIS — N2581 Secondary hyperparathyroidism of renal origin: Secondary | ICD-10-CM | POA: Diagnosis not present

## 2021-04-08 DIAGNOSIS — R110 Nausea: Secondary | ICD-10-CM | POA: Diagnosis not present

## 2021-04-08 DIAGNOSIS — D509 Iron deficiency anemia, unspecified: Secondary | ICD-10-CM | POA: Diagnosis not present

## 2021-04-08 DIAGNOSIS — R109 Unspecified abdominal pain: Secondary | ICD-10-CM | POA: Diagnosis not present

## 2021-04-08 DIAGNOSIS — Z992 Dependence on renal dialysis: Secondary | ICD-10-CM | POA: Diagnosis not present

## 2021-04-08 DIAGNOSIS — N2581 Secondary hyperparathyroidism of renal origin: Secondary | ICD-10-CM | POA: Diagnosis not present

## 2021-04-08 DIAGNOSIS — N186 End stage renal disease: Secondary | ICD-10-CM | POA: Diagnosis not present

## 2021-04-09 DIAGNOSIS — N186 End stage renal disease: Secondary | ICD-10-CM | POA: Diagnosis not present

## 2021-04-09 DIAGNOSIS — Z992 Dependence on renal dialysis: Secondary | ICD-10-CM | POA: Diagnosis not present

## 2021-04-09 DIAGNOSIS — R109 Unspecified abdominal pain: Secondary | ICD-10-CM | POA: Diagnosis not present

## 2021-04-09 DIAGNOSIS — N2581 Secondary hyperparathyroidism of renal origin: Secondary | ICD-10-CM | POA: Diagnosis not present

## 2021-04-09 DIAGNOSIS — R110 Nausea: Secondary | ICD-10-CM | POA: Diagnosis not present

## 2021-04-09 DIAGNOSIS — D509 Iron deficiency anemia, unspecified: Secondary | ICD-10-CM | POA: Diagnosis not present

## 2021-04-10 DIAGNOSIS — N186 End stage renal disease: Secondary | ICD-10-CM | POA: Diagnosis not present

## 2021-04-10 DIAGNOSIS — D509 Iron deficiency anemia, unspecified: Secondary | ICD-10-CM | POA: Diagnosis not present

## 2021-04-10 DIAGNOSIS — N2581 Secondary hyperparathyroidism of renal origin: Secondary | ICD-10-CM | POA: Diagnosis not present

## 2021-04-10 DIAGNOSIS — Z992 Dependence on renal dialysis: Secondary | ICD-10-CM | POA: Diagnosis not present

## 2021-04-10 DIAGNOSIS — R110 Nausea: Secondary | ICD-10-CM | POA: Diagnosis not present

## 2021-04-10 DIAGNOSIS — R109 Unspecified abdominal pain: Secondary | ICD-10-CM | POA: Diagnosis not present

## 2021-04-11 DIAGNOSIS — D509 Iron deficiency anemia, unspecified: Secondary | ICD-10-CM | POA: Diagnosis not present

## 2021-04-11 DIAGNOSIS — Z20822 Contact with and (suspected) exposure to covid-19: Secondary | ICD-10-CM | POA: Diagnosis not present

## 2021-04-11 DIAGNOSIS — I700 Atherosclerosis of aorta: Secondary | ICD-10-CM | POA: Diagnosis not present

## 2021-04-11 DIAGNOSIS — N3289 Other specified disorders of bladder: Secondary | ICD-10-CM | POA: Diagnosis not present

## 2021-04-11 DIAGNOSIS — D72829 Elevated white blood cell count, unspecified: Secondary | ICD-10-CM | POA: Diagnosis not present

## 2021-04-11 DIAGNOSIS — Z5329 Procedure and treatment not carried out because of patient's decision for other reasons: Secondary | ICD-10-CM | POA: Diagnosis not present

## 2021-04-11 DIAGNOSIS — R9431 Abnormal electrocardiogram [ECG] [EKG]: Secondary | ICD-10-CM | POA: Diagnosis not present

## 2021-04-11 DIAGNOSIS — N261 Atrophy of kidney (terminal): Secondary | ICD-10-CM | POA: Diagnosis not present

## 2021-04-11 DIAGNOSIS — R188 Other ascites: Secondary | ICD-10-CM | POA: Diagnosis not present

## 2021-04-11 DIAGNOSIS — N186 End stage renal disease: Secondary | ICD-10-CM | POA: Diagnosis not present

## 2021-04-11 DIAGNOSIS — N281 Cyst of kidney, acquired: Secondary | ICD-10-CM | POA: Diagnosis not present

## 2021-04-11 DIAGNOSIS — E876 Hypokalemia: Secondary | ICD-10-CM | POA: Diagnosis not present

## 2021-04-11 DIAGNOSIS — K529 Noninfective gastroenteritis and colitis, unspecified: Secondary | ICD-10-CM | POA: Diagnosis not present

## 2021-04-11 DIAGNOSIS — R110 Nausea: Secondary | ICD-10-CM | POA: Diagnosis not present

## 2021-04-11 DIAGNOSIS — K5900 Constipation, unspecified: Secondary | ICD-10-CM | POA: Diagnosis not present

## 2021-04-11 DIAGNOSIS — I120 Hypertensive chronic kidney disease with stage 5 chronic kidney disease or end stage renal disease: Secondary | ICD-10-CM | POA: Diagnosis not present

## 2021-04-11 DIAGNOSIS — Z87891 Personal history of nicotine dependence: Secondary | ICD-10-CM | POA: Diagnosis not present

## 2021-04-11 DIAGNOSIS — R109 Unspecified abdominal pain: Secondary | ICD-10-CM | POA: Diagnosis not present

## 2021-04-11 DIAGNOSIS — Z9119 Patient's noncompliance with other medical treatment and regimen: Secondary | ICD-10-CM | POA: Diagnosis not present

## 2021-04-11 DIAGNOSIS — N2581 Secondary hyperparathyroidism of renal origin: Secondary | ICD-10-CM | POA: Diagnosis not present

## 2021-04-11 DIAGNOSIS — N200 Calculus of kidney: Secondary | ICD-10-CM | POA: Diagnosis not present

## 2021-04-11 DIAGNOSIS — J90 Pleural effusion, not elsewhere classified: Secondary | ICD-10-CM | POA: Diagnosis not present

## 2021-04-11 DIAGNOSIS — I4891 Unspecified atrial fibrillation: Secondary | ICD-10-CM | POA: Diagnosis not present

## 2021-04-11 DIAGNOSIS — K652 Spontaneous bacterial peritonitis: Secondary | ICD-10-CM | POA: Diagnosis not present

## 2021-04-11 DIAGNOSIS — Z992 Dependence on renal dialysis: Secondary | ICD-10-CM | POA: Diagnosis not present

## 2021-04-12 DIAGNOSIS — D509 Iron deficiency anemia, unspecified: Secondary | ICD-10-CM | POA: Diagnosis not present

## 2021-04-12 DIAGNOSIS — N2581 Secondary hyperparathyroidism of renal origin: Secondary | ICD-10-CM | POA: Diagnosis not present

## 2021-04-12 DIAGNOSIS — R110 Nausea: Secondary | ICD-10-CM | POA: Diagnosis not present

## 2021-04-12 DIAGNOSIS — N186 End stage renal disease: Secondary | ICD-10-CM | POA: Diagnosis not present

## 2021-04-12 DIAGNOSIS — Z992 Dependence on renal dialysis: Secondary | ICD-10-CM | POA: Diagnosis not present

## 2021-04-12 DIAGNOSIS — R109 Unspecified abdominal pain: Secondary | ICD-10-CM | POA: Diagnosis not present

## 2021-04-13 DIAGNOSIS — N186 End stage renal disease: Secondary | ICD-10-CM | POA: Diagnosis not present

## 2021-04-13 DIAGNOSIS — N2581 Secondary hyperparathyroidism of renal origin: Secondary | ICD-10-CM | POA: Diagnosis not present

## 2021-04-13 DIAGNOSIS — R109 Unspecified abdominal pain: Secondary | ICD-10-CM | POA: Diagnosis not present

## 2021-04-13 DIAGNOSIS — D509 Iron deficiency anemia, unspecified: Secondary | ICD-10-CM | POA: Diagnosis not present

## 2021-04-13 DIAGNOSIS — R110 Nausea: Secondary | ICD-10-CM | POA: Diagnosis not present

## 2021-04-13 DIAGNOSIS — Z992 Dependence on renal dialysis: Secondary | ICD-10-CM | POA: Diagnosis not present

## 2021-04-14 DIAGNOSIS — Z992 Dependence on renal dialysis: Secondary | ICD-10-CM | POA: Diagnosis not present

## 2021-04-14 DIAGNOSIS — R109 Unspecified abdominal pain: Secondary | ICD-10-CM | POA: Diagnosis not present

## 2021-04-14 DIAGNOSIS — R110 Nausea: Secondary | ICD-10-CM | POA: Diagnosis not present

## 2021-04-14 DIAGNOSIS — N2581 Secondary hyperparathyroidism of renal origin: Secondary | ICD-10-CM | POA: Diagnosis not present

## 2021-04-14 DIAGNOSIS — N186 End stage renal disease: Secondary | ICD-10-CM | POA: Diagnosis not present

## 2021-04-14 DIAGNOSIS — D509 Iron deficiency anemia, unspecified: Secondary | ICD-10-CM | POA: Diagnosis not present

## 2021-04-15 DIAGNOSIS — N2581 Secondary hyperparathyroidism of renal origin: Secondary | ICD-10-CM | POA: Diagnosis not present

## 2021-04-15 DIAGNOSIS — N186 End stage renal disease: Secondary | ICD-10-CM | POA: Diagnosis not present

## 2021-04-15 DIAGNOSIS — R110 Nausea: Secondary | ICD-10-CM | POA: Diagnosis not present

## 2021-04-15 DIAGNOSIS — Z992 Dependence on renal dialysis: Secondary | ICD-10-CM | POA: Diagnosis not present

## 2021-04-15 DIAGNOSIS — R109 Unspecified abdominal pain: Secondary | ICD-10-CM | POA: Diagnosis not present

## 2021-04-15 DIAGNOSIS — D509 Iron deficiency anemia, unspecified: Secondary | ICD-10-CM | POA: Diagnosis not present

## 2021-04-16 DIAGNOSIS — N2581 Secondary hyperparathyroidism of renal origin: Secondary | ICD-10-CM | POA: Diagnosis not present

## 2021-04-16 DIAGNOSIS — Z992 Dependence on renal dialysis: Secondary | ICD-10-CM | POA: Diagnosis not present

## 2021-04-16 DIAGNOSIS — R109 Unspecified abdominal pain: Secondary | ICD-10-CM | POA: Diagnosis not present

## 2021-04-16 DIAGNOSIS — N186 End stage renal disease: Secondary | ICD-10-CM | POA: Diagnosis not present

## 2021-04-16 DIAGNOSIS — D509 Iron deficiency anemia, unspecified: Secondary | ICD-10-CM | POA: Diagnosis not present

## 2021-04-16 DIAGNOSIS — R110 Nausea: Secondary | ICD-10-CM | POA: Diagnosis not present

## 2021-04-17 DIAGNOSIS — Z992 Dependence on renal dialysis: Secondary | ICD-10-CM | POA: Diagnosis not present

## 2021-04-17 DIAGNOSIS — N186 End stage renal disease: Secondary | ICD-10-CM | POA: Diagnosis not present

## 2021-04-17 DIAGNOSIS — D509 Iron deficiency anemia, unspecified: Secondary | ICD-10-CM | POA: Diagnosis not present

## 2021-04-17 DIAGNOSIS — R110 Nausea: Secondary | ICD-10-CM | POA: Diagnosis not present

## 2021-04-17 DIAGNOSIS — N2581 Secondary hyperparathyroidism of renal origin: Secondary | ICD-10-CM | POA: Diagnosis not present

## 2021-04-17 DIAGNOSIS — K650 Generalized (acute) peritonitis: Secondary | ICD-10-CM | POA: Diagnosis not present

## 2021-04-17 DIAGNOSIS — R109 Unspecified abdominal pain: Secondary | ICD-10-CM | POA: Diagnosis not present

## 2021-04-18 DIAGNOSIS — Z992 Dependence on renal dialysis: Secondary | ICD-10-CM | POA: Diagnosis not present

## 2021-04-18 DIAGNOSIS — N2581 Secondary hyperparathyroidism of renal origin: Secondary | ICD-10-CM | POA: Diagnosis not present

## 2021-04-18 DIAGNOSIS — D509 Iron deficiency anemia, unspecified: Secondary | ICD-10-CM | POA: Diagnosis not present

## 2021-04-18 DIAGNOSIS — R109 Unspecified abdominal pain: Secondary | ICD-10-CM | POA: Diagnosis not present

## 2021-04-18 DIAGNOSIS — N186 End stage renal disease: Secondary | ICD-10-CM | POA: Diagnosis not present

## 2021-04-18 DIAGNOSIS — R110 Nausea: Secondary | ICD-10-CM | POA: Diagnosis not present

## 2021-04-19 DIAGNOSIS — D509 Iron deficiency anemia, unspecified: Secondary | ICD-10-CM | POA: Diagnosis not present

## 2021-04-19 DIAGNOSIS — N2581 Secondary hyperparathyroidism of renal origin: Secondary | ICD-10-CM | POA: Diagnosis not present

## 2021-04-19 DIAGNOSIS — R109 Unspecified abdominal pain: Secondary | ICD-10-CM | POA: Diagnosis not present

## 2021-04-19 DIAGNOSIS — R110 Nausea: Secondary | ICD-10-CM | POA: Diagnosis not present

## 2021-04-19 DIAGNOSIS — N186 End stage renal disease: Secondary | ICD-10-CM | POA: Diagnosis not present

## 2021-04-19 DIAGNOSIS — Z992 Dependence on renal dialysis: Secondary | ICD-10-CM | POA: Diagnosis not present

## 2021-04-20 DIAGNOSIS — D509 Iron deficiency anemia, unspecified: Secondary | ICD-10-CM | POA: Diagnosis not present

## 2021-04-20 DIAGNOSIS — R109 Unspecified abdominal pain: Secondary | ICD-10-CM | POA: Diagnosis not present

## 2021-04-20 DIAGNOSIS — Z992 Dependence on renal dialysis: Secondary | ICD-10-CM | POA: Diagnosis not present

## 2021-04-20 DIAGNOSIS — N2581 Secondary hyperparathyroidism of renal origin: Secondary | ICD-10-CM | POA: Diagnosis not present

## 2021-04-20 DIAGNOSIS — R110 Nausea: Secondary | ICD-10-CM | POA: Diagnosis not present

## 2021-04-20 DIAGNOSIS — N186 End stage renal disease: Secondary | ICD-10-CM | POA: Diagnosis not present

## 2021-04-21 DIAGNOSIS — K659 Peritonitis, unspecified: Secondary | ICD-10-CM | POA: Diagnosis not present

## 2021-04-21 DIAGNOSIS — B957 Other staphylococcus as the cause of diseases classified elsewhere: Secondary | ICD-10-CM | POA: Diagnosis not present

## 2021-04-21 DIAGNOSIS — Z1611 Resistance to penicillins: Secondary | ICD-10-CM | POA: Diagnosis not present

## 2021-04-21 DIAGNOSIS — D509 Iron deficiency anemia, unspecified: Secondary | ICD-10-CM | POA: Diagnosis not present

## 2021-04-21 DIAGNOSIS — Z5181 Encounter for therapeutic drug level monitoring: Secondary | ICD-10-CM | POA: Diagnosis not present

## 2021-04-21 DIAGNOSIS — N2581 Secondary hyperparathyroidism of renal origin: Secondary | ICD-10-CM | POA: Diagnosis not present

## 2021-04-21 DIAGNOSIS — Z792 Long term (current) use of antibiotics: Secondary | ICD-10-CM | POA: Diagnosis not present

## 2021-04-21 DIAGNOSIS — N186 End stage renal disease: Secondary | ICD-10-CM | POA: Diagnosis not present

## 2021-04-21 DIAGNOSIS — Z992 Dependence on renal dialysis: Secondary | ICD-10-CM | POA: Diagnosis not present

## 2021-04-22 DIAGNOSIS — Z992 Dependence on renal dialysis: Secondary | ICD-10-CM | POA: Diagnosis not present

## 2021-04-22 DIAGNOSIS — N186 End stage renal disease: Secondary | ICD-10-CM | POA: Diagnosis not present

## 2021-04-22 DIAGNOSIS — Z792 Long term (current) use of antibiotics: Secondary | ICD-10-CM | POA: Diagnosis not present

## 2021-04-22 DIAGNOSIS — K659 Peritonitis, unspecified: Secondary | ICD-10-CM | POA: Diagnosis not present

## 2021-04-22 DIAGNOSIS — N2581 Secondary hyperparathyroidism of renal origin: Secondary | ICD-10-CM | POA: Diagnosis not present

## 2021-04-22 DIAGNOSIS — D509 Iron deficiency anemia, unspecified: Secondary | ICD-10-CM | POA: Diagnosis not present

## 2021-04-23 DIAGNOSIS — D509 Iron deficiency anemia, unspecified: Secondary | ICD-10-CM | POA: Diagnosis not present

## 2021-04-23 DIAGNOSIS — Z792 Long term (current) use of antibiotics: Secondary | ICD-10-CM | POA: Diagnosis not present

## 2021-04-23 DIAGNOSIS — K659 Peritonitis, unspecified: Secondary | ICD-10-CM | POA: Diagnosis not present

## 2021-04-23 DIAGNOSIS — N2581 Secondary hyperparathyroidism of renal origin: Secondary | ICD-10-CM | POA: Diagnosis not present

## 2021-04-23 DIAGNOSIS — N186 End stage renal disease: Secondary | ICD-10-CM | POA: Diagnosis not present

## 2021-04-23 DIAGNOSIS — Z992 Dependence on renal dialysis: Secondary | ICD-10-CM | POA: Diagnosis not present

## 2021-04-24 DIAGNOSIS — N2581 Secondary hyperparathyroidism of renal origin: Secondary | ICD-10-CM | POA: Diagnosis not present

## 2021-04-24 DIAGNOSIS — D509 Iron deficiency anemia, unspecified: Secondary | ICD-10-CM | POA: Diagnosis not present

## 2021-04-24 DIAGNOSIS — K659 Peritonitis, unspecified: Secondary | ICD-10-CM | POA: Diagnosis not present

## 2021-04-24 DIAGNOSIS — N186 End stage renal disease: Secondary | ICD-10-CM | POA: Diagnosis not present

## 2021-04-24 DIAGNOSIS — Z992 Dependence on renal dialysis: Secondary | ICD-10-CM | POA: Diagnosis not present

## 2021-04-24 DIAGNOSIS — Z792 Long term (current) use of antibiotics: Secondary | ICD-10-CM | POA: Diagnosis not present

## 2021-04-25 DIAGNOSIS — K659 Peritonitis, unspecified: Secondary | ICD-10-CM | POA: Diagnosis not present

## 2021-04-25 DIAGNOSIS — Z792 Long term (current) use of antibiotics: Secondary | ICD-10-CM | POA: Diagnosis not present

## 2021-04-25 DIAGNOSIS — N186 End stage renal disease: Secondary | ICD-10-CM | POA: Diagnosis not present

## 2021-04-25 DIAGNOSIS — N2581 Secondary hyperparathyroidism of renal origin: Secondary | ICD-10-CM | POA: Diagnosis not present

## 2021-04-25 DIAGNOSIS — D509 Iron deficiency anemia, unspecified: Secondary | ICD-10-CM | POA: Diagnosis not present

## 2021-04-25 DIAGNOSIS — Z992 Dependence on renal dialysis: Secondary | ICD-10-CM | POA: Diagnosis not present

## 2021-04-26 DIAGNOSIS — N2581 Secondary hyperparathyroidism of renal origin: Secondary | ICD-10-CM | POA: Diagnosis not present

## 2021-04-26 DIAGNOSIS — N186 End stage renal disease: Secondary | ICD-10-CM | POA: Diagnosis not present

## 2021-04-26 DIAGNOSIS — Z792 Long term (current) use of antibiotics: Secondary | ICD-10-CM | POA: Diagnosis not present

## 2021-04-26 DIAGNOSIS — Z992 Dependence on renal dialysis: Secondary | ICD-10-CM | POA: Diagnosis not present

## 2021-04-26 DIAGNOSIS — K659 Peritonitis, unspecified: Secondary | ICD-10-CM | POA: Diagnosis not present

## 2021-04-26 DIAGNOSIS — D509 Iron deficiency anemia, unspecified: Secondary | ICD-10-CM | POA: Diagnosis not present

## 2021-04-27 DIAGNOSIS — Z792 Long term (current) use of antibiotics: Secondary | ICD-10-CM | POA: Diagnosis not present

## 2021-04-27 DIAGNOSIS — K659 Peritonitis, unspecified: Secondary | ICD-10-CM | POA: Diagnosis not present

## 2021-04-27 DIAGNOSIS — D509 Iron deficiency anemia, unspecified: Secondary | ICD-10-CM | POA: Diagnosis not present

## 2021-04-27 DIAGNOSIS — N186 End stage renal disease: Secondary | ICD-10-CM | POA: Diagnosis not present

## 2021-04-27 DIAGNOSIS — N2581 Secondary hyperparathyroidism of renal origin: Secondary | ICD-10-CM | POA: Diagnosis not present

## 2021-04-27 DIAGNOSIS — Z992 Dependence on renal dialysis: Secondary | ICD-10-CM | POA: Diagnosis not present

## 2021-04-28 DIAGNOSIS — N2581 Secondary hyperparathyroidism of renal origin: Secondary | ICD-10-CM | POA: Diagnosis not present

## 2021-04-28 DIAGNOSIS — N186 End stage renal disease: Secondary | ICD-10-CM | POA: Diagnosis not present

## 2021-04-28 DIAGNOSIS — Z792 Long term (current) use of antibiotics: Secondary | ICD-10-CM | POA: Diagnosis not present

## 2021-04-28 DIAGNOSIS — D509 Iron deficiency anemia, unspecified: Secondary | ICD-10-CM | POA: Diagnosis not present

## 2021-04-28 DIAGNOSIS — Z992 Dependence on renal dialysis: Secondary | ICD-10-CM | POA: Diagnosis not present

## 2021-04-28 DIAGNOSIS — K659 Peritonitis, unspecified: Secondary | ICD-10-CM | POA: Diagnosis not present

## 2021-04-29 DIAGNOSIS — D509 Iron deficiency anemia, unspecified: Secondary | ICD-10-CM | POA: Diagnosis not present

## 2021-04-29 DIAGNOSIS — N186 End stage renal disease: Secondary | ICD-10-CM | POA: Diagnosis not present

## 2021-04-29 DIAGNOSIS — Z992 Dependence on renal dialysis: Secondary | ICD-10-CM | POA: Diagnosis not present

## 2021-04-29 DIAGNOSIS — N2581 Secondary hyperparathyroidism of renal origin: Secondary | ICD-10-CM | POA: Diagnosis not present

## 2021-04-29 DIAGNOSIS — K659 Peritonitis, unspecified: Secondary | ICD-10-CM | POA: Diagnosis not present

## 2021-04-29 DIAGNOSIS — Z792 Long term (current) use of antibiotics: Secondary | ICD-10-CM | POA: Diagnosis not present

## 2021-04-30 DIAGNOSIS — K659 Peritonitis, unspecified: Secondary | ICD-10-CM | POA: Diagnosis not present

## 2021-04-30 DIAGNOSIS — D509 Iron deficiency anemia, unspecified: Secondary | ICD-10-CM | POA: Diagnosis not present

## 2021-04-30 DIAGNOSIS — N2581 Secondary hyperparathyroidism of renal origin: Secondary | ICD-10-CM | POA: Diagnosis not present

## 2021-04-30 DIAGNOSIS — Z792 Long term (current) use of antibiotics: Secondary | ICD-10-CM | POA: Diagnosis not present

## 2021-04-30 DIAGNOSIS — N186 End stage renal disease: Secondary | ICD-10-CM | POA: Diagnosis not present

## 2021-04-30 DIAGNOSIS — Z992 Dependence on renal dialysis: Secondary | ICD-10-CM | POA: Diagnosis not present

## 2021-05-01 DIAGNOSIS — Z792 Long term (current) use of antibiotics: Secondary | ICD-10-CM | POA: Diagnosis not present

## 2021-05-01 DIAGNOSIS — N186 End stage renal disease: Secondary | ICD-10-CM | POA: Diagnosis not present

## 2021-05-01 DIAGNOSIS — N2581 Secondary hyperparathyroidism of renal origin: Secondary | ICD-10-CM | POA: Diagnosis not present

## 2021-05-01 DIAGNOSIS — Z992 Dependence on renal dialysis: Secondary | ICD-10-CM | POA: Diagnosis not present

## 2021-05-01 DIAGNOSIS — K659 Peritonitis, unspecified: Secondary | ICD-10-CM | POA: Diagnosis not present

## 2021-05-01 DIAGNOSIS — D509 Iron deficiency anemia, unspecified: Secondary | ICD-10-CM | POA: Diagnosis not present

## 2021-05-02 DIAGNOSIS — D509 Iron deficiency anemia, unspecified: Secondary | ICD-10-CM | POA: Diagnosis not present

## 2021-05-02 DIAGNOSIS — N186 End stage renal disease: Secondary | ICD-10-CM | POA: Diagnosis not present

## 2021-05-02 DIAGNOSIS — Z792 Long term (current) use of antibiotics: Secondary | ICD-10-CM | POA: Diagnosis not present

## 2021-05-02 DIAGNOSIS — Z992 Dependence on renal dialysis: Secondary | ICD-10-CM | POA: Diagnosis not present

## 2021-05-02 DIAGNOSIS — N2581 Secondary hyperparathyroidism of renal origin: Secondary | ICD-10-CM | POA: Diagnosis not present

## 2021-05-02 DIAGNOSIS — K659 Peritonitis, unspecified: Secondary | ICD-10-CM | POA: Diagnosis not present

## 2021-05-03 DIAGNOSIS — Z992 Dependence on renal dialysis: Secondary | ICD-10-CM | POA: Diagnosis not present

## 2021-05-03 DIAGNOSIS — N186 End stage renal disease: Secondary | ICD-10-CM | POA: Diagnosis not present

## 2021-05-03 DIAGNOSIS — D509 Iron deficiency anemia, unspecified: Secondary | ICD-10-CM | POA: Diagnosis not present

## 2021-05-03 DIAGNOSIS — N2581 Secondary hyperparathyroidism of renal origin: Secondary | ICD-10-CM | POA: Diagnosis not present

## 2021-05-03 DIAGNOSIS — Z792 Long term (current) use of antibiotics: Secondary | ICD-10-CM | POA: Diagnosis not present

## 2021-05-03 DIAGNOSIS — K659 Peritonitis, unspecified: Secondary | ICD-10-CM | POA: Diagnosis not present

## 2021-05-04 DIAGNOSIS — Z792 Long term (current) use of antibiotics: Secondary | ICD-10-CM | POA: Diagnosis not present

## 2021-05-04 DIAGNOSIS — Z992 Dependence on renal dialysis: Secondary | ICD-10-CM | POA: Diagnosis not present

## 2021-05-04 DIAGNOSIS — N2581 Secondary hyperparathyroidism of renal origin: Secondary | ICD-10-CM | POA: Diagnosis not present

## 2021-05-04 DIAGNOSIS — N186 End stage renal disease: Secondary | ICD-10-CM | POA: Diagnosis not present

## 2021-05-04 DIAGNOSIS — K659 Peritonitis, unspecified: Secondary | ICD-10-CM | POA: Diagnosis not present

## 2021-05-04 DIAGNOSIS — D509 Iron deficiency anemia, unspecified: Secondary | ICD-10-CM | POA: Diagnosis not present

## 2021-05-05 DIAGNOSIS — Z792 Long term (current) use of antibiotics: Secondary | ICD-10-CM | POA: Diagnosis not present

## 2021-05-05 DIAGNOSIS — Z992 Dependence on renal dialysis: Secondary | ICD-10-CM | POA: Diagnosis not present

## 2021-05-05 DIAGNOSIS — K659 Peritonitis, unspecified: Secondary | ICD-10-CM | POA: Diagnosis not present

## 2021-05-05 DIAGNOSIS — D509 Iron deficiency anemia, unspecified: Secondary | ICD-10-CM | POA: Diagnosis not present

## 2021-05-05 DIAGNOSIS — N2581 Secondary hyperparathyroidism of renal origin: Secondary | ICD-10-CM | POA: Diagnosis not present

## 2021-05-05 DIAGNOSIS — N186 End stage renal disease: Secondary | ICD-10-CM | POA: Diagnosis not present

## 2021-05-06 DIAGNOSIS — N2581 Secondary hyperparathyroidism of renal origin: Secondary | ICD-10-CM | POA: Diagnosis not present

## 2021-05-06 DIAGNOSIS — Z792 Long term (current) use of antibiotics: Secondary | ICD-10-CM | POA: Diagnosis not present

## 2021-05-06 DIAGNOSIS — D509 Iron deficiency anemia, unspecified: Secondary | ICD-10-CM | POA: Diagnosis not present

## 2021-05-06 DIAGNOSIS — N186 End stage renal disease: Secondary | ICD-10-CM | POA: Diagnosis not present

## 2021-05-06 DIAGNOSIS — Z992 Dependence on renal dialysis: Secondary | ICD-10-CM | POA: Diagnosis not present

## 2021-05-06 DIAGNOSIS — K659 Peritonitis, unspecified: Secondary | ICD-10-CM | POA: Diagnosis not present

## 2021-05-07 DIAGNOSIS — N2581 Secondary hyperparathyroidism of renal origin: Secondary | ICD-10-CM | POA: Diagnosis not present

## 2021-05-07 DIAGNOSIS — Z792 Long term (current) use of antibiotics: Secondary | ICD-10-CM | POA: Diagnosis not present

## 2021-05-07 DIAGNOSIS — Z992 Dependence on renal dialysis: Secondary | ICD-10-CM | POA: Diagnosis not present

## 2021-05-07 DIAGNOSIS — N186 End stage renal disease: Secondary | ICD-10-CM | POA: Diagnosis not present

## 2021-05-07 DIAGNOSIS — K659 Peritonitis, unspecified: Secondary | ICD-10-CM | POA: Diagnosis not present

## 2021-05-07 DIAGNOSIS — D509 Iron deficiency anemia, unspecified: Secondary | ICD-10-CM | POA: Diagnosis not present

## 2021-05-08 ENCOUNTER — Encounter: Payer: Self-pay | Admitting: *Deleted

## 2021-05-08 ENCOUNTER — Inpatient Hospital Stay
Admission: EM | Admit: 2021-05-08 | Discharge: 2021-05-14 | DRG: 919 | Disposition: A | Discharge status: Home Health | Payer: Medicare Other | Attending: Internal Medicine | Admitting: Internal Medicine

## 2021-05-08 ENCOUNTER — Other Ambulatory Visit: Payer: Self-pay

## 2021-05-08 ENCOUNTER — Emergency Department: Payer: Medicare Other

## 2021-05-08 DIAGNOSIS — N200 Calculus of kidney: Secondary | ICD-10-CM | POA: Diagnosis not present

## 2021-05-08 DIAGNOSIS — N261 Atrophy of kidney (terminal): Secondary | ICD-10-CM | POA: Diagnosis not present

## 2021-05-08 DIAGNOSIS — E872 Acidosis, unspecified: Secondary | ICD-10-CM | POA: Diagnosis present

## 2021-05-08 DIAGNOSIS — E871 Hypo-osmolality and hyponatremia: Secondary | ICD-10-CM | POA: Diagnosis present

## 2021-05-08 DIAGNOSIS — Z992 Dependence on renal dialysis: Secondary | ICD-10-CM | POA: Diagnosis not present

## 2021-05-08 DIAGNOSIS — A4102 Sepsis due to Methicillin resistant Staphylococcus aureus: Secondary | ICD-10-CM | POA: Diagnosis present

## 2021-05-08 DIAGNOSIS — I351 Nonrheumatic aortic (valve) insufficiency: Secondary | ICD-10-CM | POA: Diagnosis present

## 2021-05-08 DIAGNOSIS — I70201 Unspecified atherosclerosis of native arteries of extremities, right leg: Secondary | ICD-10-CM | POA: Diagnosis present

## 2021-05-08 DIAGNOSIS — D649 Anemia, unspecified: Secondary | ICD-10-CM | POA: Diagnosis not present

## 2021-05-08 DIAGNOSIS — Y838 Other surgical procedures as the cause of abnormal reaction of the patient, or of later complication, without mention of misadventure at the time of the procedure: Secondary | ICD-10-CM | POA: Diagnosis present

## 2021-05-08 DIAGNOSIS — R109 Unspecified abdominal pain: Secondary | ICD-10-CM | POA: Diagnosis present

## 2021-05-08 DIAGNOSIS — K650 Generalized (acute) peritonitis: Secondary | ICD-10-CM | POA: Diagnosis present

## 2021-05-08 DIAGNOSIS — I2510 Atherosclerotic heart disease of native coronary artery without angina pectoris: Secondary | ICD-10-CM | POA: Diagnosis present

## 2021-05-08 DIAGNOSIS — I10 Essential (primary) hypertension: Secondary | ICD-10-CM | POA: Diagnosis not present

## 2021-05-08 DIAGNOSIS — I4891 Unspecified atrial fibrillation: Secondary | ICD-10-CM | POA: Diagnosis present

## 2021-05-08 DIAGNOSIS — I4819 Other persistent atrial fibrillation: Secondary | ICD-10-CM | POA: Diagnosis present

## 2021-05-08 DIAGNOSIS — N151 Renal and perinephric abscess: Secondary | ICD-10-CM | POA: Diagnosis present

## 2021-05-08 DIAGNOSIS — K651 Peritoneal abscess: Secondary | ICD-10-CM | POA: Diagnosis present

## 2021-05-08 DIAGNOSIS — I120 Hypertensive chronic kidney disease with stage 5 chronic kidney disease or end stage renal disease: Secondary | ICD-10-CM | POA: Diagnosis not present

## 2021-05-08 DIAGNOSIS — N2581 Secondary hyperparathyroidism of renal origin: Secondary | ICD-10-CM | POA: Diagnosis present

## 2021-05-08 DIAGNOSIS — D631 Anemia in chronic kidney disease: Secondary | ICD-10-CM | POA: Diagnosis present

## 2021-05-08 DIAGNOSIS — R509 Fever, unspecified: Secondary | ICD-10-CM | POA: Diagnosis not present

## 2021-05-08 DIAGNOSIS — Z8249 Family history of ischemic heart disease and other diseases of the circulatory system: Secondary | ICD-10-CM | POA: Diagnosis not present

## 2021-05-08 DIAGNOSIS — E8721 Acute metabolic acidosis: Secondary | ICD-10-CM | POA: Diagnosis not present

## 2021-05-08 DIAGNOSIS — T45516A Underdosing of anticoagulants, initial encounter: Secondary | ICD-10-CM | POA: Diagnosis present

## 2021-05-08 DIAGNOSIS — K5900 Constipation, unspecified: Secondary | ICD-10-CM | POA: Diagnosis not present

## 2021-05-08 DIAGNOSIS — K659 Peritonitis, unspecified: Principal | ICD-10-CM | POA: Diagnosis present

## 2021-05-08 DIAGNOSIS — Z20822 Contact with and (suspected) exposure to covid-19: Secondary | ICD-10-CM | POA: Diagnosis present

## 2021-05-08 DIAGNOSIS — Z87891 Personal history of nicotine dependence: Secondary | ICD-10-CM

## 2021-05-08 DIAGNOSIS — E8770 Fluid overload, unspecified: Secondary | ICD-10-CM | POA: Diagnosis not present

## 2021-05-08 DIAGNOSIS — R1030 Lower abdominal pain, unspecified: Secondary | ICD-10-CM

## 2021-05-08 DIAGNOSIS — Z79899 Other long term (current) drug therapy: Secondary | ICD-10-CM

## 2021-05-08 DIAGNOSIS — Z91138 Patient's unintentional underdosing of medication regimen for other reason: Secondary | ICD-10-CM | POA: Diagnosis not present

## 2021-05-08 DIAGNOSIS — I5032 Chronic diastolic (congestive) heart failure: Secondary | ICD-10-CM | POA: Diagnosis present

## 2021-05-08 DIAGNOSIS — N186 End stage renal disease: Secondary | ICD-10-CM | POA: Diagnosis present

## 2021-05-08 DIAGNOSIS — R197 Diarrhea, unspecified: Secondary | ICD-10-CM | POA: Diagnosis not present

## 2021-05-08 DIAGNOSIS — T8571XA Infection and inflammatory reaction due to peritoneal dialysis catheter, initial encounter: Principal | ICD-10-CM | POA: Diagnosis present

## 2021-05-08 DIAGNOSIS — R5381 Other malaise: Secondary | ICD-10-CM | POA: Diagnosis not present

## 2021-05-08 DIAGNOSIS — A419 Sepsis, unspecified organism: Secondary | ICD-10-CM | POA: Diagnosis not present

## 2021-05-08 DIAGNOSIS — Z9862 Peripheral vascular angioplasty status: Secondary | ICD-10-CM

## 2021-05-08 DIAGNOSIS — I132 Hypertensive heart and chronic kidney disease with heart failure and with stage 5 chronic kidney disease, or end stage renal disease: Secondary | ICD-10-CM | POA: Diagnosis present

## 2021-05-08 DIAGNOSIS — Z7901 Long term (current) use of anticoagulants: Secondary | ICD-10-CM

## 2021-05-08 DIAGNOSIS — K652 Spontaneous bacterial peritonitis: Secondary | ICD-10-CM | POA: Diagnosis not present

## 2021-05-08 DIAGNOSIS — R531 Weakness: Secondary | ICD-10-CM | POA: Diagnosis not present

## 2021-05-08 DIAGNOSIS — R112 Nausea with vomiting, unspecified: Secondary | ICD-10-CM | POA: Diagnosis not present

## 2021-05-08 LAB — CBC
HCT: 24.4 % — ABNORMAL LOW (ref 39.0–52.0)
Hemoglobin: 8.2 g/dL — ABNORMAL LOW (ref 13.0–17.0)
MCH: 34 pg (ref 26.0–34.0)
MCHC: 33.6 g/dL (ref 30.0–36.0)
MCV: 101.2 fL — ABNORMAL HIGH (ref 80.0–100.0)
Platelets: 583 10*3/uL — ABNORMAL HIGH (ref 150–400)
RBC: 2.41 MIL/uL — ABNORMAL LOW (ref 4.22–5.81)
RDW: 14.2 % (ref 11.5–15.5)
WBC: 23.8 10*3/uL — ABNORMAL HIGH (ref 4.0–10.5)
nRBC: 0 % (ref 0.0–0.2)

## 2021-05-08 LAB — COMPREHENSIVE METABOLIC PANEL
ALT: 6 U/L (ref 0–44)
AST: 22 U/L (ref 15–41)
Albumin: 2.2 g/dL — ABNORMAL LOW (ref 3.5–5.0)
Alkaline Phosphatase: 159 U/L — ABNORMAL HIGH (ref 38–126)
Anion gap: 11 (ref 5–15)
BUN: 53 mg/dL — ABNORMAL HIGH (ref 8–23)
CO2: 27 mmol/L (ref 22–32)
Calcium: 8.9 mg/dL (ref 8.9–10.3)
Chloride: 90 mmol/L — ABNORMAL LOW (ref 98–111)
Creatinine, Ser: 12.79 mg/dL — ABNORMAL HIGH (ref 0.61–1.24)
GFR, Estimated: 4 mL/min — ABNORMAL LOW (ref >60)
Glucose, Bld: 115 mg/dL — ABNORMAL HIGH (ref 70–99)
Potassium: 4.9 mmol/L (ref 3.5–5.1)
Sodium: 128 mmol/L — ABNORMAL LOW (ref 135–145)
Total Bilirubin: 3.3 mg/dL — ABNORMAL HIGH (ref 0.3–1.2)
Total Protein: 7.1 g/dL (ref 6.5–8.1)

## 2021-05-08 LAB — RESP PANEL BY RT-PCR (FLU A&B, COVID) ARPGX2
Influenza A by PCR: NEGATIVE
Influenza B by PCR: NEGATIVE
SARS Coronavirus 2 by RT PCR: NEGATIVE

## 2021-05-08 LAB — LIPASE, BLOOD: Lipase: 22 U/L (ref 11–51)

## 2021-05-08 LAB — MAGNESIUM: Magnesium: 1.5 mg/dL — ABNORMAL LOW (ref 1.7–2.4)

## 2021-05-08 MED ORDER — NITROGLYCERIN 0.4 MG SL SUBL: 0.4000 mg | SUBLINGUAL_TABLET | SUBLINGUAL | Status: DC | PRN

## 2021-05-08 MED ORDER — RENA-VITE PO TABS
1.0000 | ORAL_TABLET | Freq: Every day | ORAL | Status: DC
  Administered 2021-05-09 – 2021-05-14 (×6): 1 via ORAL
  Filled 2021-05-08 (×6): qty 1

## 2021-05-08 MED ORDER — PANTOPRAZOLE SODIUM 40 MG IV SOLR
40.0000 mg | INTRAVENOUS | Status: DC
  Administered 2021-05-08 – 2021-05-10 (×3): 40 mg via INTRAVENOUS
  Filled 2021-05-08 (×2): qty 40

## 2021-05-08 MED ORDER — LOSARTAN POTASSIUM 50 MG PO TABS: 50.0000 mg | ORAL_TABLET | Freq: Every day | ORAL | Status: DC

## 2021-05-08 MED ORDER — MINOXIDIL 2.5 MG PO TABS: 5.0000 mg | ORAL_TABLET | Freq: Every day | ORAL | Status: DC

## 2021-05-08 MED ORDER — ACETAMINOPHEN 325 MG PO TABS: 650.0000 mg | ORAL_TABLET | Freq: Every day | ORAL | Status: DC | PRN

## 2021-05-08 MED ORDER — DOXAZOSIN MESYLATE 8 MG PO TABS: 8.0000 mg | ORAL_TABLET | Freq: Every day | ORAL | Status: DC

## 2021-05-08 MED ORDER — CALCIUM ACETATE 667 MG PO CAPS: 667.0000 mg | ORAL_CAPSULE | Freq: Three times a day (TID) | ORAL | Status: DC

## 2021-05-08 MED ORDER — FUROSEMIDE 40 MG PO TABS
80.0000 mg | ORAL_TABLET | Freq: Two times a day (BID) | ORAL | Status: DC
  Administered 2021-05-08 – 2021-05-14 (×12): 80 mg via ORAL
  Filled 2021-05-08 (×13): qty 2

## 2021-05-08 MED ORDER — APIXABAN 5 MG PO TABS
5.0000 mg | ORAL_TABLET | Freq: Two times a day (BID) | ORAL | Status: DC
  Administered 2021-05-08 – 2021-05-14 (×12): 5 mg via ORAL
  Filled 2021-05-08 (×12): qty 1

## 2021-05-08 MED ORDER — DILTIAZEM HCL 30 MG PO TABS: 60.0000 mg | ORAL_TABLET | Freq: Four times a day (QID) | ORAL | Status: DC | PRN

## 2021-05-08 MED ORDER — SODIUM CHLORIDE 0.9 % IV SOLN: INTRAVENOUS | Status: AC

## 2021-05-08 MED ORDER — OXYCODONE-ACETAMINOPHEN 5-325 MG PO TABS
1.0000 | ORAL_TABLET | Freq: Four times a day (QID) | ORAL | Status: DC | PRN
  Administered 2021-05-10 – 2021-05-12 (×4): 1 via ORAL
  Filled 2021-05-08 (×4): qty 1

## 2021-05-08 MED ORDER — SODIUM BICARBONATE 650 MG PO TABS
1300.0000 mg | ORAL_TABLET | Freq: Three times a day (TID) | ORAL | Status: DC
  Administered 2021-05-08 – 2021-05-14 (×18): 1300 mg via ORAL
  Filled 2021-05-08 (×18): qty 2

## 2021-05-08 MED ORDER — DILTIAZEM HCL 25 MG/5ML IV SOLN: 10.0000 mg | Freq: Once | INTRAVENOUS | Status: DC

## 2021-05-08 MED ORDER — SODIUM CHLORIDE 0.9% FLUSH
3.0000 mL | Freq: Two times a day (BID) | INTRAVENOUS | Status: DC
  Administered 2021-05-10 – 2021-05-14 (×9): 3 mL via INTRAVENOUS

## 2021-05-08 MED ORDER — DILTIAZEM HCL 25 MG/5ML IV SOLN: 10.0000 mg | INTRAVENOUS | Status: AC

## 2021-05-08 MED ORDER — VANCOMYCIN HCL 1500 MG/300ML IV SOLN
1500.0000 mg | Freq: Once | INTRAVENOUS | Status: AC
  Administered 2021-05-08: 1500 mg via INTRAVENOUS
  Filled 2021-05-08 (×2): qty 300

## 2021-05-08 MED ORDER — TAMSULOSIN HCL 0.4 MG PO CAPS
0.4000 mg | ORAL_CAPSULE | Freq: Every day | ORAL | Status: DC
  Administered 2021-05-09 – 2021-05-14 (×6): 0.4 mg via ORAL
  Filled 2021-05-08 (×6): qty 1

## 2021-05-08 MED ORDER — ATENOLOL-CHLORTHALIDONE 50-25 MG PO TABS: 1.0000 | ORAL_TABLET | Freq: Every day | ORAL | Status: DC

## 2021-05-08 MED ORDER — ISOSORBIDE MONONITRATE ER 60 MG PO TB24: 30.0000 mg | ORAL_TABLET | Freq: Every day | ORAL | Status: DC

## 2021-05-08 MED ORDER — DILTIAZEM HCL ER COATED BEADS 120 MG PO CP24
120.0000 mg | ORAL_CAPSULE | Freq: Every day | ORAL | Status: DC
  Filled 2021-05-08: qty 1

## 2021-05-08 MED ORDER — DILTIAZEM HCL 25 MG/5ML IV SOLN
INTRAVENOUS | Status: AC
  Administered 2021-05-08: 10 mg via INTRAVENOUS
  Filled 2021-05-08: qty 5

## 2021-05-08 NOTE — ED Triage Notes (Signed)
Pt brought in via ems from home.  Pt had dialysis 2 days ago.  Pt reports abd pain with vomiting and diarrhea.  Pt alert  speech clear.

## 2021-05-08 NOTE — ED Notes (Addendum)
Reviewed pt's cc and results; acuity level changed; reviewed chart with Dr Charlsie Quest; orders obtained for CT and will wait for next available exam room

## 2021-05-08 NOTE — H&P (Signed)
History and Physical    Rodney Gordon QIH:[id number] DOB: 1956/06/16 DOA: 05/08/2021  PCP: Neale Burly, MD  Cardiologist: [facility]: Levell July Dr.Mcdowell MD. Nephrology:[facility] Patient coming from:  Home   Chief Complaint:  Abdominal pain.   HPI: Rodney Gordon is a 65 y.o. male seen in ed with complaints of abdominal pain, currently being t/t for peritonitis by his nephrologist and still having abd pain and not feeling well and having fevers and chills and vomiting.pt went to [facility] on 9/21 and left AMA as they did not have staff with skills for PD, pt at that time got up and left. Per pt he has been having abdominal pain and infection since past month and half it started when he went to [facility] ion august then got abx and then he went back in September and left ama because they did not have service / staff for PD and then he was following up with his nephrologist and is here today for same but worsening pain and fever and chills. Pt was diagnosed with new onset a.fib rvr per cardiology note on 03/16/2021 and is in a.fib rvr as well.Pt was given eliquis and he has not taken it but does not know since how long he has been off or if he has had any side effects. Pt is on two regimens of diltiazem one scheduled and one prn.   Pt has past medical history of aortic valve insufficiency (mild to moderate), CAD nonobstructive by cardiac catheterization 2004.  ESRD on PD, HTN, PVD, a.fib rvr.  ED Course:  Vitals:   05/08/21 1612 05/08/21 1615 05/08/21 2056 05/08/21 2300  BP: 120/88  (!) 148/99 (!) 148/106  Pulse: 85  72 (!) 149  Resp: '18  18 18  ' Temp: 97.7 F (36.5 C)     SpO2: 94%  99% 100%  Weight:  77.6 kg    Height:  6' (1.829 m)    In the emergency room patient is alert awake afebrile oxygenating on room air. Patient received blood cultures in the ED followed by vancomycin dosed by nephrology recommendation.  Hospitalist asked to admit  patient for abdominal pain and working diagnosis of peritonitis.  CMP today shows hyponatremia with a sodium of 128 chloride of 90 glucose 115 BUN of 53, potassium 5.9 creatinine of 12.79 bicarb of 27 alk phos 159 albumin 2.2 lipase normal AST ALT normal, total bili of 3.3, white count of 23.8, hemoglobin of 8.2, MCV of 101.2, platelet count of 583.pt in a.fib rvr and ordered diltiazem 10 mg iv x 1.   Review of Systems:  Review of Systems  Constitutional:  Positive for chills and fever.  HENT: Negative.    Eyes: Negative.   Respiratory: Negative.    Cardiovascular: Negative.   Gastrointestinal:  Positive for abdominal pain.  Skin: Negative.   All other systems reviewed and are negative.  Past Medical History:  Diagnosis Date   Anemia of chronic disease    Aortic insufficiency    Mild to moderate   Coronary atherosclerosis of native coronary artery    Nonobstructive at catheterization 2004   ESRD on hemodialysis [facility])    [name]   Essential hypertension    Peripheral vascular disease [facility])     Past Surgical History:  Procedure Laterality Date   A/V FISTULAGRAM Right 09/05/2017   Procedure: A/V FISTULAGRAM;  Surgeon: Angelia Mould, MD;  Location: [place] [facility];  Service: Cardiovascular;  Laterality: Right;   AV FISTULA PLACEMENT Right 12/17/2016   Procedure: ARTERIOVENOUS (AV) FISTULA CREATION- RIGHT BRACHIOCEPHALIC;  Surgeon: Elam Dutch, MD;  Location: [place] [facility];  Service: Vascular;  Laterality: Right;   CAPD INSERTION N/A 11/03/2017   Procedure: LAPAROSCOPIC INSERTION CONTINUOUS AMBULATORY PERITONEAL DIALYSIS  (CAPD) CATHETER;  Surgeon: Clovis Riley, MD;  Location: [facility];  Service: General;  Laterality: N/A;   CARDIAC CATHETERIZATION     at [facility]   IR DIALY SHUNT INTRO [place] W/IMG RIGHT Right 04/24/2017   IR US GUIDE VASC ACCESS RIGHT  04/24/2017   PERIPHERAL VASCULAR BALLOON ANGIOPLASTY Right 09/05/2017   Procedure: PERIPHERAL VASCULAR  BALLOON ANGIOPLASTY;  Surgeon: Angelia Mould, MD;  Location: [place] [facility];  Service: Cardiovascular;  Laterality: Right;   PROSTATE SURGERY     TIBIA FRACTURE SURGERY     Left     reports that he quit smoking about 22 years ago. His smoking use included cigarettes. He started smoking about 39 years ago. He has a 25.00 pack-year smoking history. He has never used smokeless tobacco. He reports that he does not drink alcohol and does not use drugs.  No Known Allergies  Family History  Problem Relation Age of Onset   Cancer Other    Coronary artery disease Other     Prior to Admission medications   Medication Sig Start Date End Date Taking? Authorizing Provider  acetaminophen (TYLENOL) 500 MG tablet Take 1,000 mg by mouth 2 (two) times daily as needed for mild pain.    [provider]  acetaminophen (TYLENOL) 650 MG CR tablet Take 650 mg by mouth daily as needed for pain.    [provider]  apixaban (ELIQUIS) 5 MG TABS tablet Take 1 tablet (5 mg total) by mouth 2 (two) times daily. 02/13/21   Verta Ellen., NP  atenolol-chlorthalidone (TENORETIC) 50-25 MG tablet Take 1 tablet by mouth daily. 01/28/20   [provider]  calcium acetate (PHOSLO) 667 MG capsule Take 667 mg by mouth 3 (three) times daily with meals.    [provider]  diltiazem (CARDIZEM CD) 120 MG 24 hr capsule Take 1 capsule (120 mg total) by mouth daily. 02/13/21 05/14/21  Verta Ellen., NP  diltiazem (CARDIZEM) 60 MG tablet Take 60 mg by mouth 4 (four) times daily as needed (for rapid hr). 03/13/21 03/13/22  [provider]  diphenhydramine-acetaminophen (TYLENOL PM) 25-500 MG TABS tablet Take 1 tablet by mouth 2 (two) times daily as needed (PAIN).    [provider]  doxazosin (CARDURA) 8 MG tablet Take 8 mg by mouth daily.    [provider]  furosemide (LASIX) 80 MG tablet Take 80 mg by mouth 2 (two) times daily. 01/28/19   [provider]  isosorbide mononitrate (IMDUR) 30 MG 24 hr tablet Take 30 mg by mouth daily.  03/16/22  [provider]  lidocaine-prilocaine (EMLA) cream Apply 1 application topically as needed (prior to dialysis).  08/06/17   [provider]  losartan (COZAAR) 50 MG tablet Take 50 mg by mouth daily. 12/08/19   [provider]  methocarbamol (ROBAXIN) 500 MG tablet Take 500 mg by mouth at bedtime. 04/27/20   [provider]  minoxidil (LONITEN) 2.5 MG tablet Take 5 mg by mouth daily. 11/17/20   [provider]  multivitamin (RENA-VIT) TABS tablet Take 1 tablet by mouth daily. 12/29/20   [provider]  nitroGLYCERIN (NITROSTAT) 0.4 MG SL tablet  Place 1 tablet (0.4 mg total) under the tongue as directed. Patient taking differently: Place 0.4 mg under the tongue every 5 (five) minutes as needed for chest pain. 11/19/16   Satira Sark, MD  oxyCODONE-acetaminophen (PERCOCET/ROXICET) 5-325 MG tablet Take 1 tablet by mouth every 6 (six) hours as needed for severe pain. 11/03/17   Clovis Riley, MD  potassium chloride SA (K-DUR) 20 MEQ tablet Take 20 mEq by mouth 3 (three) times daily. 10/31/18   [provider]  sildenafil (VIAGRA) 50 MG tablet Take 50 mg by mouth daily as needed. 02/07/21   [provider]  sodium bicarbonate 650 MG tablet Take 1,300 mg by mouth 3 (three) times daily.     [provider]  tamsulosin (FLOMAX) 0.4 MG CAPS capsule Take 1 capsule by mouth daily. 11/30/18   [provider]  triamcinolone cream (KENALOG) 0.1 % Apply topically daily. 02/05/21   [provider]    Physical Exam: Vitals:   05/08/21 1612 05/08/21 1615 05/08/21 2056 05/08/21 2300  BP: 120/88  (!) 148/99 (!) 148/106  Pulse: 85  72 (!) 149  Resp: '18  18 18  ' Temp: 97.7 F (36.5 C)     SpO2: 94%  99% 100%  Weight:  77.6 kg    Height:  6' (1.829 m)     Physical Exam Vitals and nursing note reviewed.   Constitutional:      General: He is not in acute distress.    Appearance: He is not ill-appearing, toxic-appearing or diaphoretic.  HENT:     Head: Normocephalic and atraumatic.     Right Ear: External ear normal.     Left Ear: External ear normal.     Mouth/Throat:     Mouth: Mucous membranes are moist.  Eyes:     Extraocular Movements: Extraocular movements intact.     Pupils: Pupils are equal, round, and reactive to light.  Cardiovascular:     Rate and Rhythm: Normal rate and regular rhythm.     Heart sounds: Murmur heard.  Pulmonary:     Effort: Pulmonary effort is normal.     Breath sounds: Normal breath sounds.  Abdominal:     General: Bowel sounds are normal. There is no distension or abdominal bruit.     Palpations: Abdomen is soft.     Tenderness: There is no abdominal tenderness.       Comments: Location of abdominal pain when it happened it was sharp 10/10 Radiating to both sides, constant and worse when he would dialyze himself, better with Upright positioning and sitting up.   Musculoskeletal:     Right lower leg: No edema.     Left lower leg: No edema.  Skin:    General: Skin is warm and dry.  Neurological:     General: No focal deficit present.     Mental Status: He is alert and oriented to person, place, and time.     Cranial Nerves: No cranial nerve deficit.     Motor: No weakness.  Psychiatric:        Mood and Affect: Mood normal.        Behavior: Behavior normal.    Labs on Admission: I have personally reviewed following labs and imaging studies  No results for input(s): CKTOTAL, CKMB, TROPONINI in the last 72 hours. Lab Results  Component Value Date   WBC 23.8 (H) 05/08/2021   HGB 8.2 (L) 05/08/2021   HCT 24.4 (L) 05/08/2021   MCV  101.2 (H) 05/08/2021   PLT 583 (H) 05/08/2021    Recent Labs  Lab 05/08/21 1618  NA 128*  K 4.9  CL 90*  CO2 27  BUN 53*  CREATININE 12.79*  CALCIUM 8.9  PROT 7.1  BILITOT 3.3*  ALKPHOS 159*  ALT 6   AST 22  GLUCOSE 115*   COVID-19 Labs No results for input(s): DDIMER, FERRITIN, LDH, CRP in the last 72 hours. No results found for: [place]  Radiological Exams on Admission: CT ABDOMEN PELVIS WO CONTRAST  Result Date: 05/08/2021 CLINICAL DATA:  Dialysis nausea vomiting diarrhea EXAM: CT ABDOMEN AND PELVIS WITHOUT CONTRAST TECHNIQUE: Multidetector CT imaging of the abdomen and pelvis was performed following the standard protocol without IV contrast. COMPARISON:  CT 04/11/2021 FINDINGS: Lower chest: Lung bases demonstrate no acute consolidation or effusion. Coronary vascular calcification. Trace pericardial effusion. Hepatobiliary: No calcified gallstone. No focal hepatic abnormality. No biliary dilatation. Pancreas: Unremarkable. No pancreatic ductal dilatation or surrounding inflammatory changes. Spleen: Normal in size without focal abnormality. Adrenals/Urinary Tract: Adrenal glands are within normal limits. Atrophic kidneys without hydronephrosis. Stable exophytic cyst upper pole left kidney. Small stones and intrarenal vascular calcification on the right. Mild dilatation of left renal collecting system with possible hyperdense material in the left renal pelvis. No obstructing stone. Thick-walled urinary bladder with stranding. Stomach/Bowel: Stomach nonenlarged. No dilated small bowel. No acute bowel wall thickening. Partially visualized negative appendix. Vascular/Lymphatic: Advanced aortic atherosclerosis. No aneurysm. No suspicious nodes Reproductive: Coarse prostate calcifications. Calcified seminal vesicles. Other: Small amount of free fluid surrounding the liver and spleen and also within the right lower quadrant. Small amount of free air in the upper abdomen. Peritoneal dialysis catheter coiled in the right lower quadrant. Musculoskeletal: No acute or suspicious osseous abnormality. IMPRESSION: 1. Negative for bowel obstruction or bowel inflammatory process. 2. Peritoneal dialysis  catheter coiled in the right lower quadrant. There is small amount of free fluid in the upper abdomen as well as the right lower quadrant in addition to small amount of pneumoperitoneum which is felt related to peritoneal dialysis catheter. These findings have been present on the comparison studies. 3. Atrophic native kidneys. Small stones in the right kidney. Slightly dilated left renal collecting system with possible hyperdense material or tissue within the left renal collecting system, nonemergent follow-up contrasted exam may be considered. 4. Slightly thick-walled urinary bladder with mild stranding/perivesical inflammatory change Electronically Signed   By: Donavan Foil M.D.   On: 05/08/2021 20:33    EKG: Independently reviewed.  None  Echocardiogram 02/28/2021   1. No significant change from previous echo images of Aug 2021.   2. Hypokinesis/akinesis of the basal inferior, inferoseptal and  inferolateral walls . Left ventricular ejection fraction, by estimation,  is 55 to 60%. The left ventricle has normal function. There is severe  asymmetric left ventricular hypertrophy. Left  ventricular diastolic parameters are indeterminate.   3. Right ventricular systolic function is normal. The right ventricular  size is normal. There is normal pulmonary artery systolic pressure.   4. Left atrial size was mildly dilated.   5. Right atrial size was mildly dilated.   6. Very mild systolic anterior motion of the mitral valve. Mild mitral  valve regurgitation.   7. The aortic valve is tricuspid. Aortic valve regurgitation is mild to  moderate. Mild to moderate aortic valve sclerosis/calcification is  present, without any evidence of aortic stenosis.   8. The inferior vena cava is dilated in size with >50% respiratory  variability, suggesting  right atrial pressure of 8 mmHg.   Assessment/Plan Principal Problem:   Abdominal pain Active Problems:   Fever and chills   Peritonitis (HCC)   Atrial  fibrillation with RVR (HCC)   ESRD (end stage renal disease) ([place])   Essential hypertension, benign   Anemia   Abdominal pain/Fevers/ Chills/ Peritonitis: Attribute to peritonitis related to PD catheter most likely.  Pt cont on vancomycin per pharmacy protocol.  Prn tylenol as needed. ID consult per am team. Plan for catheter as this infection has been going on for since august per nephrology. Nephrology consult  per am team.  Prn tylenol.    A./fib RVR: New onset since august this year.  We will cont pt on eliquis at home dose of 5 mg bid.  Diltiazem 10 mg iv x 1 given in ed for rvr. We will cont diltiazem and diltiazem cd.   ESRD ON PD: Plan for PD per nephrology. We will cont pt on his home regimen with multiple BP meds and renagel and phoslo.   HTN: Blood pressure (!) 148/106, pulse (!) 149, temperature 97.7 F (36.5 C), resp. rate 18, height 6' (1.829 m), weight 77.6 kg, SpO2 100 %. Cont minoxidil/ tenoretic/ cardizem/ imdur/ doxazosin/ lasix/ cozaar.   Anemia: Attribute to anemia of CKD. Type and screen and transfuse as deemed appropriate.     DVT prophylaxis:  Eliquis  Code Status:  Full code    Family Communication:  Thomasene Mohair (Sister)  [phone] (Mobile)   Disposition Plan:  Home    Consults called:  none  Admission status: Inpatient.     Para Skeans MD [facility] [phone] How to contact the [facility] Attending or Consulting provider [place] or covering provider during after hours [place], for this patient.    Check the care team in [facility] and look for a) attending/consulting [facility] provider listed and b) the [facility] team listed Log into [email or web address] and use [place]'s universal password to access. If you do not have the password, please contact the hospital operator. Locate the [facility] provider you are looking for under [facility] and page to a number that you can be directly reached. If you still have difficulty reaching the  provider, please page the [facility] (Director on Call) for the Hospitalists listed on amion for assistance. [email or web address] Password [email or web address] 05/08/2021, 11:26 PM

## 2021-05-08 NOTE — ED Provider Notes (Signed)
[facility] Emergency Department Provider Note  Time seen: 9:55 PM  I have reviewed the triage vital signs and the nursing notes.   HISTORY  Chief Complaint Abdominal Pain   HPI Rodney Gordon is a 65 y.o. male with a past medical history of CAD, hypertension, ESRD on peritoneal dialysis who presents to the emergency department for peritonitis.  According to the patient he has been treated for peritonitis by his nephrologist with oral antibiotics as well as antibiotics through his PD catheter.  He states he continues to feel unwell and was having fever last night as well as vomiting today.  Patient was seen by his nephrologist who sent him to the emergency department for admission for IV antibiotics.  Vital signs are currently reassuring.  Patient states mild abdominal discomfort is his only complaint.   Past Medical History:  Diagnosis Date   Anemia of chronic disease    Aortic insufficiency    Mild to moderate   Coronary atherosclerosis of native coronary artery    Nonobstructive at catheterization 2004   ESRD on hemodialysis [facility])    [name]   Essential hypertension    Peripheral vascular disease [facility])     Patient Active Problem List   Diagnosis Date Noted   ESRD (end stage renal disease) ([place]) 07/26/2013   Palpitations 04/15/2011   Aortic regurgitation 02/12/2010   Essential hypertension, benign 04/07/2009   VALVULAR HEART DISEASE 04/07/2009    Past Surgical History:  Procedure Laterality Date   A/V FISTULAGRAM Right 09/05/2017   Procedure: A/V FISTULAGRAM;  Surgeon: Angelia Mould, MD;  Location: [place] [facility];  Service: Cardiovascular;  Laterality: Right;   AV FISTULA PLACEMENT Right 12/17/2016   Procedure: ARTERIOVENOUS (AV) FISTULA CREATION- RIGHT BRACHIOCEPHALIC;  Surgeon: Elam Dutch, MD;  Location: [place] OR;  Service: Vascular;  Laterality: Right;   CAPD INSERTION N/A 11/03/2017   Procedure: LAPAROSCOPIC INSERTION  CONTINUOUS AMBULATORY PERITONEAL DIALYSIS  (CAPD) CATHETER;  Surgeon: Clovis Riley, MD;  Location: [place];  Service: General;  Laterality: N/A;   CARDIAC CATHETERIZATION     at [facility]   IR DIALY SHUNT INTRO [place] W/IMG RIGHT Right 04/24/2017   IR US GUIDE VASC ACCESS RIGHT  04/24/2017   PERIPHERAL VASCULAR BALLOON ANGIOPLASTY Right 09/05/2017   Procedure: PERIPHERAL VASCULAR BALLOON ANGIOPLASTY;  Surgeon: Angelia Mould, MD;  Location: [place] [facility];  Service: Cardiovascular;  Laterality: Right;   PROSTATE SURGERY     TIBIA FRACTURE SURGERY     Left    Prior to Admission medications   Medication Sig Start Date End Date Taking? Authorizing Provider  acetaminophen (TYLENOL) 500 MG tablet Take 1,000 mg by mouth 2 (two) times daily as needed for mild pain.    [provider]  acetaminophen (TYLENOL) 650 MG CR tablet Take 650 mg by mouth daily as needed for pain.    [provider]  apixaban (ELIQUIS) 5 MG TABS tablet Take 1 tablet (5 mg total) by mouth 2 (two) times daily. 02/13/21   Verta Ellen., NP  atenolol-chlorthalidone (TENORETIC) 50-25 MG tablet Take 1 tablet by mouth daily. 01/28/20   [provider]  calcium acetate (PHOSLO) 667 MG capsule Take 667 mg by mouth 3 (three) times daily with meals.    [provider]  diltiazem (CARDIZEM CD) 120 MG 24 hr capsule Take 1 capsule (120 mg total) by mouth daily. 02/13/21 05/14/21  Verta Ellen., NP  diltiazem [place])  60 MG tablet Take 60 mg by mouth 4 (four) times daily as needed (for rapid hr). 03/13/21 03/13/22  [provider]  diphenhydramine-acetaminophen (TYLENOL PM) 25-500 MG TABS tablet Take 1 tablet by mouth 2 (two) times daily as needed (PAIN).    [provider]  doxazosin (CARDURA) 8 MG tablet Take 8 mg by mouth daily.    [provider]  furosemide (LASIX) 80 MG tablet Take 80 mg by mouth 2 (two) times daily. 01/28/19   [provider]  isosorbide mononitrate (IMDUR) 30 MG 24 hr tablet Take 30 mg by mouth daily.  03/16/22  [provider]  lidocaine-prilocaine (EMLA) cream Apply 1 application topically as needed (prior to dialysis).  08/06/17   [provider]  losartan (COZAAR) 50 MG tablet Take 50 mg by mouth daily. 12/08/19   [provider]  methocarbamol (ROBAXIN) 500 MG tablet Take 500 mg by mouth at bedtime. 04/27/20   [provider]  minoxidil (LONITEN) 2.5 MG tablet Take 5 mg by mouth daily. 11/17/20   [provider]  multivitamin (RENA-VIT) TABS tablet Take 1 tablet by mouth daily. 12/29/20   [provider]  nitroGLYCERIN (NITROSTAT) 0.4 MG SL tablet Place 1 tablet (0.4 mg total) under the tongue as directed. Patient taking differently: Place 0.4 mg under the tongue every 5 (five) minutes as needed for chest pain. 11/19/16   Satira Sark, MD  oxyCODONE-acetaminophen (PERCOCET/ROXICET) 5-325 MG tablet Take 1 tablet by mouth every 6 (six) hours as needed for severe pain. 11/03/17   Clovis Riley, MD  potassium chloride SA (K-DUR) 20 MEQ tablet Take 20 mEq by mouth 3 (three) times daily. 10/31/18   [provider]  sildenafil (VIAGRA) 50 MG tablet Take 50 mg by mouth daily as needed. 02/07/21   [provider]  sodium bicarbonate 650 MG tablet Take 1,300 mg by mouth 3 (three) times daily.     [provider]  tamsulosin (FLOMAX) 0.4 MG CAPS capsule Take 1 capsule by mouth daily. 11/30/18   [provider]  triamcinolone cream (KENALOG) 0.1 % Apply topically daily. 02/05/21   [provider]    No Known Allergies  Family History  Problem Relation Age of Onset   Cancer Other    Coronary artery disease Other     Social History Social History   Tobacco Use   Smoking status: Former    Packs/day: 1.00    Years: 25.00    Pack years: 25.00    Types: Cigarettes    Start date: 07/07/1981    Quit date:  07/22/1998    Years since quitting: 22.8   Smokeless tobacco: Never  Vaping Use   Vaping Use: Never used  Substance Use Topics   Alcohol use: No    Alcohol/week: 0.0 standard drinks   Drug use: No    Review of Systems Constitutional: Negative for fever. Cardiovascular: Negative for chest pain. Respiratory: Negative for shortness of breath. Gastrointestinal: Mild abdominal discomfort.  Positive for nausea and vomiting earlier today.  Denies any nausea currently. Genitourinary: Produces a small amount of urine daily, no dysuria. Musculoskeletal: Negative for musculoskeletal complaints Neurological: Negative for headache All other ROS negative  ____________________________________________   PHYSICAL EXAM:  VITAL SIGNS: ED Triage Vitals  Enc Vitals Group     BP 05/08/21 1612 120/88     Pulse Rate 05/08/21 1612 85     Resp 05/08/21 1612 18     Temp 05/08/21 1612 97.7  F (36.5 C)     Temp src --      SpO2 05/08/21 1612 94 %     Weight 05/08/21 1615 171 lb (77.6 kg)     Height 05/08/21 1615 6' (1.829 m)     Head Circumference --      Peak Flow --      Pain Score 05/08/21 1614 6     Pain Loc --      Pain Edu? --      Excl. in [place]? --    Constitutional: Alert and oriented. Well appearing and in no distress. Eyes: Normal exam ENT      Head: Normocephalic and atraumatic.      Mouth/Throat: Mucous membranes are moist. Cardiovascular: Normal rate, regular rhythm. Respiratory: Normal respiratory effort without tachypnea nor retractions. Breath sounds are clear Gastrointestinal: Soft, peritoneal dialysis present.  Patient has very mild but diffuse abdominal tenderness palpation without rebound or guarding.  No distention. Musculoskeletal: Nontender with normal range of motion in all extremities.  Neurologic:  Normal speech and language. No gross focal neurologic deficits  Skin:  Skin is warm, dry and intact.  Psychiatric: Mood and affect are normal.    ____________________________________________    RADIOLOGY  CT scan of the abdomen is negative for bowel obstruction does show peritoneal dialysis catheter with a small amount of pneumoperitoneum likely related to the dialysis catheter.  Does show slight wall thickening of the urinary bladder.  ____________________________________________   INITIAL IMPRESSION / ASSESSMENT AND PLAN / ED COURSE  Pertinent labs & imaging results that were available during my care of the patient were reviewed by me and considered in my medical decision making (see chart for details).   Patient presents emergency department furred by his nephrologist for evaluation for peritonitis.  Patient found to have significant leukocytosis of 23,000, given the reported fever last night nausea and vomiting today.  I spoke to nephrology who recommends 1500 mg of vancomycin, blood cultures and admit to the hospitalist service.  Nephrology will see in the morning.  Patient agreeable to this plan of care.  Nephrology states they have already obtained peritoneal fluid cultures that are positive for MRSA.  Rodney Gordon was evaluated in Emergency Department on 05/08/2021 for the symptoms described in the history of present illness. He was evaluated in the context of the global COVID-19 pandemic, which necessitated consideration that the patient might be at risk for infection with the SARS-CoV-2 virus that causes COVID-19. Institutional protocols and algorithms that pertain to the evaluation of patients at risk for COVID-19 are in a state of rapid change based on information released by regulatory bodies including the CDC and federal and state organizations. These policies and algorithms were followed during the patient's care in the ED.  ____________________________________________   FINAL CLINICAL IMPRESSION(S) / ED DIAGNOSES  Bacterial peritonitis   Harvest Dark, MD 05/08/21 2200

## 2021-05-09 DIAGNOSIS — R1030 Lower abdominal pain, unspecified: Secondary | ICD-10-CM | POA: Diagnosis not present

## 2021-05-09 LAB — CBC
HCT: 21.2 % — ABNORMAL LOW (ref 39.0–52.0)
Hemoglobin: 6.9 g/dL — ABNORMAL LOW (ref 13.0–17.0)
MCH: 32.5 pg (ref 26.0–34.0)
MCHC: 32.5 g/dL (ref 30.0–36.0)
MCV: 100 fL (ref 80.0–100.0)
Platelets: 497 10*3/uL — ABNORMAL HIGH (ref 150–400)
RBC: 2.12 MIL/uL — ABNORMAL LOW (ref 4.22–5.81)
RDW: 14.2 % (ref 11.5–15.5)
WBC: 18.5 10*3/uL — ABNORMAL HIGH (ref 4.0–10.5)
nRBC: 0 % (ref 0.0–0.2)

## 2021-05-09 LAB — PREPARE RBC (CROSSMATCH)

## 2021-05-09 LAB — ABO/RH: ABO/RH(D): O POS

## 2021-05-09 LAB — HIV ANTIBODY (ROUTINE TESTING W REFLEX): HIV Screen 4th Generation wRfx: NONREACTIVE

## 2021-05-09 MED ORDER — VANCOMYCIN VARIABLE DOSE PER UNSTABLE RENAL FUNCTION (PHARMACIST DOSING): Status: DC

## 2021-05-09 MED ORDER — DELFLEX-LC/1.5% DEXTROSE 344 MOSM/L IP SOLN: INTRAPERITONEAL | Status: DC

## 2021-05-09 MED ORDER — ATENOLOL 50 MG PO TABS
50.0000 mg | ORAL_TABLET | Freq: Every day | ORAL | Status: DC
  Administered 2021-05-09 – 2021-05-14 (×6): 50 mg via ORAL
  Filled 2021-05-09 (×4): qty 1
  Filled 2021-05-09: qty 2
  Filled 2021-05-09: qty 1

## 2021-05-09 MED ORDER — DILTIAZEM LOAD VIA INFUSION
20.0000 mg | Freq: Once | INTRAVENOUS | Status: AC
  Administered 2021-05-09: 20 mg via INTRAVENOUS
  Filled 2021-05-09: qty 20

## 2021-05-09 MED ORDER — GENTAMICIN SULFATE 0.1 % EX CREA
1.0000 "application " | TOPICAL_CREAM | Freq: Every day | CUTANEOUS | Status: DC
  Administered 2021-05-09 – 2021-05-14 (×5): 1 via TOPICAL
  Filled 2021-05-09 (×2): qty 15

## 2021-05-09 MED ORDER — VANCOMYCIN 100 MG/ML FOR DIALYSIS: 1250.0000 mg | INJECTION | Freq: Once | Status: DC

## 2021-05-09 MED ORDER — SODIUM CHLORIDE 0.9% IV SOLUTION
Freq: Once | INTRAVENOUS | Status: AC
  Filled 2021-05-09: qty 250

## 2021-05-09 MED ORDER — DILTIAZEM HCL-DEXTROSE 125-5 MG/125ML-% IV SOLN (PREMIX)
5.0000 mg/h | INTRAVENOUS | Status: DC
Start: 2021-05-09 — End: 2021-05-10
  Administered 2021-05-09: 5 mg/h via INTRAVENOUS
  Administered 2021-05-09: 7.5 mg/h via INTRAVENOUS
  Administered 2021-05-10: 5 mg/h via INTRAVENOUS
  Filled 2021-05-09 (×3): qty 125

## 2021-05-09 MED ORDER — LOPERAMIDE HCL 2 MG PO CAPS
2.0000 mg | ORAL_CAPSULE | Freq: Once | ORAL | Status: AC
  Administered 2021-05-09: 2 mg via ORAL
  Filled 2021-05-09: qty 1

## 2021-05-09 MED ORDER — CHLORTHALIDONE 25 MG PO TABS
25.0000 mg | ORAL_TABLET | Freq: Every day | ORAL | Status: DC
  Administered 2021-05-09 – 2021-05-14 (×6): 25 mg via ORAL
  Filled 2021-05-09 (×6): qty 1

## 2021-05-09 MED ORDER — DELFLEX-LC/1.5% DEXTROSE 344 MOSM/L IP SOLN
INTRAPERITONEAL | Status: DC
  Filled 2021-05-09: qty 3000

## 2021-05-09 MED ORDER — VANCOMYCIN 100 MG/ML FOR DIALYSIS
Freq: Once | Status: DC
  Filled 2021-05-09: qty 3000

## 2021-05-09 MED ORDER — SACCHAROMYCES BOULARDII 250 MG PO CAPS
250.0000 mg | ORAL_CAPSULE | Freq: Two times a day (BID) | ORAL | Status: DC
  Administered 2021-05-10 – 2021-05-14 (×8): 250 mg via ORAL
  Filled 2021-05-09 (×11): qty 1

## 2021-05-09 NOTE — ED Notes (Signed)
Informed RN bed assigned 

## 2021-05-09 NOTE — Progress Notes (Signed)
[facility]  ROUNDING NOTE   Subjective:   Rodney Gordon is a 65 year old male with past medical history of anemia, CAD, hypertension, PVD, and end-stage renal disease on peritoneal dialysis.  He presents to the emergency room today with complaints of abdominal pain and was directed by nephrologist for further evaluation.  He will be admitted for Abdominal pain [R10.9]   Patient is known to our clinic and receives outpatient dialysis at [facility], supervised by Dr. Holley Raring.  He states he has been having abdominal pain for about a month.  Pain worsens during PD treatments.  Does report cloudy PD fluid during drain.  Has been able to maintain outpatient PD schedule, has not missed treatments.  Reports poor appetite with some vomiting.  Denies shortness of breath or other pain or discomfort.  Reports having intermittent fever and chills.  Labs on ED arrival include sodium 128, potassium 4.9, creatinine 12.79, albumin 2.2, and white blood cell 23.8.  CT abdomen pelvis negative for bowel obstruction.  COVID/flu negative  We will maintain peritoneal dialysis while patient is admitted  Objective:  Vital signs in last 24 hours:  Temp:  [97.7 F (36.5 C)] 97.7 F (36.5 C) (10/18 1612) Pulse Rate:  [72-149] 104 (10/19 0619) Resp:  [17-18] 17 (10/19 0619) BP: (110-148)/(72-106) 132/72 (10/19 0619) SpO2:  [94 %-100 %] 100 % (10/19 0619) Weight:  [77.6 kg] 77.6 kg (10/18 1615)  Weight change:  Filed Weights   05/08/21 1615  Weight: 77.6 kg    Intake/Output: No intake/output data recorded.   Intake/Output this shift:  No intake/output data recorded.  Physical Exam: General: NAD, resting on stretcher  Head: Normocephalic, atraumatic. Moist oral mucosal membranes  Eyes: Anicteric  Lungs:  Clear to auscultation, normal effort  Heart: Regular rate and rhythm  Abdomen:  Soft, nontender, nondistended  Extremities: Trace peripheral edema.  Neurologic: Alert/oriented moving  all four extremities  Skin: No lesions  Access: PD catheter    Basic Metabolic Panel: Recent Labs  Lab 05/08/21 1616 05/08/21 1618  NA  --  128*  K  --  4.9  CL  --  90*  CO2  --  27  GLUCOSE  --  115*  BUN  --  53*  CREATININE  --  12.79*  CALCIUM  --  8.9  MG 1.5*  --     Liver Function Tests: Recent Labs  Lab 05/08/21 1618  AST 22  ALT 6  ALKPHOS 159*  BILITOT 3.3*  PROT 7.1  ALBUMIN 2.2*   Recent Labs  Lab 05/08/21 1618  LIPASE 22   No results for input(s): AMMONIA in the last 168 hours.  CBC: Recent Labs  Lab 05/08/21 1618 05/09/21 0748  WBC 23.8* 18.5*  HGB 8.2* 6.9*  HCT 24.4* 21.2*  MCV 101.2* 100.0  PLT 583* 497*    Cardiac Enzymes: No results for input(s): CKTOTAL, CKMB, CKMBINDEX, TROPONINI in the last 168 hours.  BNP: Invalid input(s): POCBNP  CBG: No results for input(s): GLUCAP in the last 168 hours.  Microbiology: Results for orders placed or performed during the hospital encounter of 05/08/21  Resp Panel by RT-PCR (Flu A&B, Covid) Nasopharyngeal Swab     Status: None   Collection Time: 05/08/21 10:05 PM   Specimen: Nasopharyngeal Swab; Nasopharyngeal(NP) swabs in vial transport medium  Result Value Ref Range Status   SARS Coronavirus 2 by RT PCR NEGATIVE NEGATIVE Final    Comment: (NOTE) SARS-CoV-2 target nucleic acids are NOT DETECTED.  The SARS-CoV-2 RNA is generally detectable in upper respiratory specimens during the acute phase of infection. The lowest concentration of SARS-CoV-2 viral copies this assay can detect is 138 copies/mL. A negative result does not preclude SARS-Cov-2 infection and should not be used as the sole basis for treatment or other patient management decisions. A negative result may occur with  improper specimen collection/handling, submission of specimen other than nasopharyngeal swab, presence of viral mutation(s) within the areas targeted by this assay, and inadequate number of viral copies(<138  copies/mL). A negative result must be combined with clinical observations, patient history, and epidemiological information. The expected result is Negative.  Fact Sheet for Patients:  EntrepreneurPulse.com.au  Fact Sheet for Healthcare Providers:  IncredibleEmployment.be  This test is no t yet approved or cleared by the [place] FDA and  has been authorized for detection and/or diagnosis of SARS-CoV-2 by FDA under an Emergency Use Authorization (EUA). This EUA will remain  in effect (meaning this test can be used) for the duration of the COVID-19 declaration under Section 564(b)(1) of the Act, 21 U.S.C.section 360bbb-3(b)(1), unless the authorization is terminated  or revoked sooner.       Influenza A by PCR NEGATIVE NEGATIVE Final   Influenza B by PCR NEGATIVE NEGATIVE Final    Comment: (NOTE) The Xpert Xpress SARS-CoV-2/FLU/RSV plus assay is intended as an aid in the diagnosis of influenza from Nasopharyngeal swab specimens and should not be used as a sole basis for treatment. Nasal washings and aspirates are unacceptable for Xpert Xpress SARS-CoV-2/FLU/RSV testing.  Fact Sheet for Patients: EntrepreneurPulse.com.au  Fact Sheet for Healthcare Providers: IncredibleEmployment.be  This test is not yet approved or cleared by the [place] FDA and has been authorized for detection and/or diagnosis of SARS-CoV-2 by FDA under an Emergency Use Authorization (EUA). This EUA will remain in effect (meaning this test can be used) for the duration of the COVID-19 declaration under Section 564(b)(1) of the Act, 21 U.S.C. section 360bbb-3(b)(1), unless the authorization is terminated or revoked.  Performed at [facility], [address] [place], [place] [address]   Blood culture (routine x 2)     Status: None (Preliminary result)   Collection Time: 05/08/21 10:05 PM   Specimen: BLOOD   Result Value Ref Range Status   Specimen Description BLOOD LEFT FOREARM  Final   Special Requests   Final    BOTTLES DRAWN AEROBIC AND ANAEROBIC Blood Culture results may not be optimal due to an inadequate volume of blood received in culture bottles   Culture   Final    NO GROWTH < 12 HOURS Performed at [facility], [address] [place], [place] [address]    Report Status PENDING  Incomplete  Blood culture (routine x 2)     Status: None (Preliminary result)   Collection Time: 05/08/21 10:05 PM   Specimen: BLOOD  Result Value Ref Range Status   Specimen Description BLOOD LEFT FOREARM  Final   Special Requests   Final    BOTTLES DRAWN AEROBIC AND ANAEROBIC Blood Culture results may not be optimal due to an inadequate volume of blood received in culture bottles   Culture   Final    NO GROWTH < 12 HOURS Performed at [facility], [address] [place], [place] [address]    Report Status PENDING  Incomplete    Coagulation Studies: No results for input(s): LABPROT, INR in the last 72 hours.  Urinalysis: No results for input(s): COLORURINE, LABSPEC, [place],  GLUCOSEU, HGBUR, BILIRUBINUR, KETONESUR, PROTEINUR, UROBILINOGEN, NITRITE, LEUKOCYTESUR in the last 72 hours.  Invalid input(s): APPERANCEUR    Imaging: CT ABDOMEN PELVIS WO CONTRAST  Result Date: 05/08/2021 CLINICAL DATA:  Dialysis nausea vomiting diarrhea EXAM: CT ABDOMEN AND PELVIS WITHOUT CONTRAST TECHNIQUE: Multidetector CT imaging of the abdomen and pelvis was performed following the standard protocol without IV contrast. COMPARISON:  CT 04/11/2021 FINDINGS: Lower chest: Lung bases demonstrate no acute consolidation or effusion. Coronary vascular calcification. Trace pericardial effusion. Hepatobiliary: No calcified gallstone. No focal hepatic abnormality. No biliary dilatation. Pancreas: Unremarkable. No pancreatic ductal dilatation or surrounding inflammatory changes. Spleen: Normal in size  without focal abnormality. Adrenals/Urinary Tract: Adrenal glands are within normal limits. Atrophic kidneys without hydronephrosis. Stable exophytic cyst upper pole left kidney. Small stones and intrarenal vascular calcification on the right. Mild dilatation of left renal collecting system with possible hyperdense material in the left renal pelvis. No obstructing stone. Thick-walled urinary bladder with stranding. Stomach/Bowel: Stomach nonenlarged. No dilated small bowel. No acute bowel wall thickening. Partially visualized negative appendix. Vascular/Lymphatic: Advanced aortic atherosclerosis. No aneurysm. No suspicious nodes Reproductive: Coarse prostate calcifications. Calcified seminal vesicles. Other: Small amount of free fluid surrounding the liver and spleen and also within the right lower quadrant. Small amount of free air in the upper abdomen. Peritoneal dialysis catheter coiled in the right lower quadrant. Musculoskeletal: No acute or suspicious osseous abnormality. IMPRESSION: 1. Negative for bowel obstruction or bowel inflammatory process. 2. Peritoneal dialysis catheter coiled in the right lower quadrant. There is small amount of free fluid in the upper abdomen as well as the right lower quadrant in addition to small amount of pneumoperitoneum which is felt related to peritoneal dialysis catheter. These findings have been present on the comparison studies. 3. Atrophic native kidneys. Small stones in the right kidney. Slightly dilated left renal collecting system with possible hyperdense material or tissue within the left renal collecting system, nonemergent follow-up contrasted exam may be considered. 4. Slightly thick-walled urinary bladder with mild stranding/perivesical inflammatory change Electronically Signed   By: Donavan Foil M.D.   On: 05/08/2021 20:33     Medications:    sodium chloride 75 mL/hr at 05/09/21 0214   diltiazem (CARDIZEM) infusion 7.5 mg/hr (05/09/21 0449)    sodium  chloride   Intravenous Once   apixaban  5 mg Oral BID   atenolol  50 mg Oral Daily   And   chlorthalidone  25 mg Oral Daily   furosemide  80 mg Oral BID   multivitamin  1 tablet Oral Daily   pantoprazole (PROTONIX) IV  40 mg Intravenous Q24H   sodium bicarbonate  1,300 mg Oral TID   sodium chloride flush  3 mL Intravenous Q12H   tamsulosin  0.4 mg Oral Daily   vancomycin variable dose per unstable renal function (pharmacist dosing)   Does not apply See admin instructions   acetaminophen, diltiazem, nitroGLYCERIN, oxyCODONE-acetaminophen  Assessment/ Plan:  Mr. BARDIA SARANTOS is a 65 y.o.  male with past medical history of anemia, CAD, hypertension, PVD, and end-stage renal disease on peritoneal dialysis.  He presents to the emergency room today with complaints of abdominal pain and was directed by nephrologist for further evaluation.  He will be admitted for Abdominal pain [R10.9]  [facility]- 4 cycles/3L fills/ 2576m last fill/ 20/10  End-stage renal disease on peritoneal dialysis.  Will maintain outpatient nightly schedule.  Patient currently in ED hallway, have requested priority room placement to receive treatment tonight.  Last PD treatment Monday night.  Orders placed  2. Anemia of chronic kidney disease Lab Results  Component Value Date   HGB 6.9 (L) 05/09/2021  Hemoglobin below target, will defer blood transfusions to primary team.  1 unit ordered this morning.  3. Secondary Hyperparathyroidism:  Lab Results  Component Value Date   CALCIUM 8.9 05/08/2021   CAION 1.13 (L) 09/05/2017  Calcium at goal Calcium acetate with meals Will monitor bone minerals during this admission  4.  Hypertension with chronic kidney disease.  Home regimen includes atenolol, diltiazem, furosemide, losartan, and isosorbide. Losartan held.  5.  Acute peritonitis.  Culture ordered from peritoneal fluid, will instill 1250 mg of vancomycin into the peritoneal cavity to dwell.   Received IV vancomycin 1500 mg once.    LOS: 1 [facility] 10/19/202211:02 AM

## 2021-05-09 NOTE — Progress Notes (Signed)
PROGRESS NOTE    Rodney Gordon  [id number] DOB: 12-27-1955 DOA: 05/08/2021 PCP: Neale Burly, MD    Brief Narrative:  This 65 years old male with PMH significant for aortic valve insufficiency, CAD, nonobstructive, ESRD on PD, hypertension, peripheral vascular disease, A. fib presented in the ED with complaint of abdominal pain.  He is currently being treated for peritonitis by his nephrologist and is still having abdominal pain and not feeling well.  Patient also reports fever and chills and vomiting he went to [facility] on 9/21 and left AMA as he did not have staff with his skills for peritoneal dialysis.  Patient has been having abdominal pain infection since last month, patient has multiple visits to the ED and left AGAINST MEDICAL ADVICE. Patient has been noncompliant with Eliquis but has been taking Cardizem for heart rate control. Patient is admitted for peritonitis possibly due to peritoneal dialysis catheter infection.  Patient has received vancomycin, nephrology is consulted.  Assessment & Plan:   Principal Problem:   Abdominal pain Active Problems:   Essential hypertension, benign   ESRD (end stage renal disease) (HCC)   Fever and chills   Peritonitis (HCC)   Atrial fibrillation with RVR (HCC)   Anemia   Abdominal pain Rodney Gordon /chills secondary to peritonitis: Patient having ongoing abdominal pain, intermittent fever and chills secondary to peritonitis. Patient has been to ER multiple times and left AGAINST MEDICAL ADVICE. Most likely related to PD catheter infection. Continue vancomycin as per pharmacy protocol Adequate pain control. Nephrology consulted, continue vancomycin MRSE grew in initial cultures from 04/17/2021. Resume PD treatments tonight. Plan for intraperitoneal vancomycin.  Atrial fibrillation with RVR: New onset since August this year. Patient was discharged on Eliquis and Cardizem. Patient has not been taking Eliquis but taking  Cardizem. Heart rate is reasonably controlled, continue Cardizem CD Resume Eliquis.   ESRD on PD: Nephrology consulted, Will resume PD tonight.   Essential hypertension: Continue minoxidil, do not take, Cardizem, Imdur, doxazosin. Hold Lasix and Cozaar   Anemia of chronic disease: Hemoglobin dropped to 6.8 Transfuse 1 unit PRBC, follow posttransfusion CBC    DVT prophylaxis: Eliquis Code Status: Full code Family Communication: No family at bedside Disposition Plan:   Status is: Inpatient  Remains inpatient appropriate because: Needs IV antibiotics for peritonitis.  Anticipated discharge home in few days once infection resolves.  Consultants:  Nephrology  Procedures: Antimicrobials:   Anti-infectives (From admission, onward)    Start     Dose/Rate Route Frequency Ordered Stop   05/09/21 1645  vancomycin (VANCOCIN) 100 mg/mL injection 1,250 mg        1,250 mg Intraperitoneal  Once 05/09/21 1549     05/09/21 0024  vancomycin variable dose per unstable renal function (pharmacist dosing)         Does not apply See admin instructions 05/09/21 0025     05/08/21 2200  vancomycin (VANCOREADY) IVPB 1500 mg/300 mL        1,500 mg 150 mL/hr over 120 Minutes Intravenous  Once 05/08/21 2150 05/09/21 0215       Subjective: Patient was seen and examined at bedside.  Overnight events noted.   Patient was lying in the bed in the hallway,  states he want to be in the room.   Patient denies any abdominal pain.  Objective: Vitals:   05/09/21 0619 05/09/21 1100 05/09/21 1427 05/09/21 1450  BP: 132/72 118/64 114/66 112/75  Pulse: (!) 104 (!) 107 97 68  Resp: 17 16  16 18  Temp:    97.9 F (36.6 C)  SpO2: 100% 99% 99% 100%  Weight:    70.3 kg  Height:       No intake or output data in the 24 hours ending 05/09/21 1606 Filed Weights   05/08/21 1615 05/09/21 1450  Weight: 77.6 kg 70.3 kg    Examination:  General exam: Appears comfortable, not in any acute distress,  chronically ill looking. Respiratory system: Clear to auscultation. Respiratory effort normal. Cardiovascular system: S1-S2 heard, irregular rhythm, no murmur.   Gastrointestinal system: Abdomen is soft, nontender, nondistended, BS +.  PD catheter noted, no erythema. Central nervous system: Alert and oriented x 3. No focal neurological deficits. Extremities: No edema, no cyanosis, no clubbing. Skin: No rashes, lesions or ulcers Psychiatry: Judgement and insight appear normal. Mood & affect appropriate.     Data Reviewed: I have personally reviewed following labs and imaging studies  CBC: Recent Labs  Lab 05/08/21 1618 05/09/21 0748  WBC 23.8* 18.5*  HGB 8.2* 6.9*  HCT 24.4* 21.2*  MCV 101.2* 100.0  PLT 583* [id number]*   Basic Metabolic Panel: Recent Labs  Lab 05/08/21 1616 05/08/21 1618  NA  --  128*  K  --  4.9  CL  --  90*  CO2  --  27  GLUCOSE  --  115*  BUN  --  53*  CREATININE  --  12.79*  CALCIUM  --  8.9  MG 1.5*  --    GFR: Estimated Creatinine Clearance: 5.8 mL/min (A) (by C-G formula based on SCr of 12.79 mg/dL (H)). Liver Function Tests: Recent Labs  Lab 05/08/21 1618  AST 22  ALT 6  ALKPHOS 159*  BILITOT 3.3*  PROT 7.1  ALBUMIN 2.2*   Recent Labs  Lab 05/08/21 1618  LIPASE 22   No results for input(s): AMMONIA in the last 168 hours. Coagulation Profile: No results for input(s): INR, PROTIME in the last 168 hours. Cardiac Enzymes: No results for input(s): CKTOTAL, CKMB, CKMBINDEX, TROPONINI in the last 168 hours. BNP (last 3 results) No results for input(s): PROBNP in the last 8760 hours. HbA1C: No results for input(s): HGBA1C in the last 72 hours. CBG: No results for input(s): GLUCAP in the last 168 hours. Lipid Profile: No results for input(s): CHOL, HDL, LDLCALC, TRIG, CHOLHDL, LDLDIRECT in the last 72 hours. Thyroid Function Tests: No results for input(s): TSH, T4TOTAL, FREET4, T3FREE, THYROIDAB in the last 72 hours. Anemia Panel: No  results for input(s): VITAMINB12, FOLATE, FERRITIN, TIBC, IRON, RETICCTPCT in the last 72 hours. Sepsis Labs: No results for input(s): PROCALCITON, LATICACIDVEN in the last 168 hours.  Recent Results (from the past 240 hour(s))  Resp Panel by RT-PCR (Flu A&B, Covid) Nasopharyngeal Swab     Status: None   Collection Time: 05/08/21 10:05 PM   Specimen: Nasopharyngeal Swab; Nasopharyngeal(NP) swabs in vial transport medium  Result Value Ref Range Status   SARS Coronavirus 2 by RT PCR NEGATIVE NEGATIVE Final    Comment: (NOTE) SARS-CoV-2 target nucleic acids are NOT DETECTED.  The SARS-CoV-2 RNA is generally detectable in upper respiratory specimens during the acute phase of infection. The lowest concentration of SARS-CoV-2 viral copies this assay can detect is 138 copies/mL. A negative result does not preclude SARS-Cov-2 infection and should not be used as the sole basis for treatment or other patient management decisions. A negative result may occur with  improper specimen collection/handling, submission of specimen other than nasopharyngeal swab, presence of viral  mutation(s) within the areas targeted by this assay, and inadequate number of viral copies(<138 copies/mL). A negative result must be combined with clinical observations, patient history, and epidemiological information. The expected result is Negative.  Fact Sheet for Patients:  EntrepreneurPulse.com.au  Fact Sheet for Healthcare Providers:  IncredibleEmployment.be  This test is no t yet approved or cleared by the [place] FDA and  has been authorized for detection and/or diagnosis of SARS-CoV-2 by FDA under an Emergency Use Authorization (EUA). This EUA will remain  in effect (meaning this test can be used) for the duration of the COVID-19 declaration under Section 564(b)(1) of the Act, 21 U.S.C.section 360bbb-3(b)(1), unless the authorization is terminated  or revoked sooner.        Influenza A by PCR NEGATIVE NEGATIVE Final   Influenza B by PCR NEGATIVE NEGATIVE Final    Comment: (NOTE) The Xpert Xpress SARS-CoV-2/FLU/RSV plus assay is intended as an aid in the diagnosis of influenza from Nasopharyngeal swab specimens and should not be used as a sole basis for treatment. Nasal washings and aspirates are unacceptable for Xpert Xpress SARS-CoV-2/FLU/RSV testing.  Fact Sheet for Patients: EntrepreneurPulse.com.au  Fact Sheet for Healthcare Providers: IncredibleEmployment.be  This test is not yet approved or cleared by the [place] FDA and has been authorized for detection and/or diagnosis of SARS-CoV-2 by FDA under an Emergency Use Authorization (EUA). This EUA will remain in effect (meaning this test can be used) for the duration of the COVID-19 declaration under Section 564(b)(1) of the Act, 21 U.S.C. section 360bbb-3(b)(1), unless the authorization is terminated or revoked.  Performed at [facility], [address] [place], [place] [address]   Blood culture (routine x 2)     Status: None (Preliminary result)   Collection Time: 05/08/21 10:05 PM   Specimen: BLOOD  Result Value Ref Range Status   Specimen Description BLOOD LEFT FOREARM  Final   Special Requests   Final    BOTTLES DRAWN AEROBIC AND ANAEROBIC Blood Culture results may not be optimal due to an inadequate volume of blood received in culture bottles   Culture   Final    NO GROWTH < 12 HOURS Performed at [facility], [address] [place], [place] [address]    Report Status PENDING  Incomplete  Blood culture (routine x 2)     Status: None (Preliminary result)   Collection Time: 05/08/21 10:05 PM   Specimen: BLOOD  Result Value Ref Range Status   Specimen Description BLOOD LEFT FOREARM  Final   Special Requests   Final    BOTTLES DRAWN AEROBIC AND ANAEROBIC Blood Culture results may not be optimal due to an inadequate  volume of blood received in culture bottles   Culture   Final    NO GROWTH < 12 HOURS Performed at [facility], [address], [place], [place] [address]    Report Status PENDING  Incomplete    Radiology Studies: CT ABDOMEN PELVIS WO CONTRAST  Result Date: 05/08/2021 CLINICAL DATA:  Dialysis nausea vomiting diarrhea EXAM: CT ABDOMEN AND PELVIS WITHOUT CONTRAST TECHNIQUE: Multidetector CT imaging of the abdomen and pelvis was performed following the standard protocol without IV contrast. COMPARISON:  CT 04/11/2021 FINDINGS: Lower chest: Lung bases demonstrate no acute consolidation or effusion. Coronary vascular calcification. Trace pericardial effusion. Hepatobiliary: No calcified gallstone. No focal hepatic abnormality. No biliary dilatation. Pancreas: Unremarkable. No pancreatic ductal dilatation or surrounding inflammatory changes. Spleen: Normal in size without focal abnormality. Adrenals/Urinary Tract: Adrenal glands  are within normal limits. Atrophic kidneys without hydronephrosis. Stable exophytic cyst upper pole left kidney. Small stones and intrarenal vascular calcification on the right. Mild dilatation of left renal collecting system with possible hyperdense material in the left renal pelvis. No obstructing stone. Thick-walled urinary bladder with stranding. Stomach/Bowel: Stomach nonenlarged. No dilated small bowel. No acute bowel wall thickening. Partially visualized negative appendix. Vascular/Lymphatic: Advanced aortic atherosclerosis. No aneurysm. No suspicious nodes Reproductive: Coarse prostate calcifications. Calcified seminal vesicles. Other: Small amount of free fluid surrounding the liver and spleen and also within the right lower quadrant. Small amount of free air in the upper abdomen. Peritoneal dialysis catheter coiled in the right lower quadrant. Musculoskeletal: No acute or suspicious osseous abnormality. IMPRESSION: 1. Negative for bowel obstruction or bowel  inflammatory process. 2. Peritoneal dialysis catheter coiled in the right lower quadrant. There is small amount of free fluid in the upper abdomen as well as the right lower quadrant in addition to small amount of pneumoperitoneum which is felt related to peritoneal dialysis catheter. These findings have been present on the comparison studies. 3. Atrophic native kidneys. Small stones in the right kidney. Slightly dilated left renal collecting system with possible hyperdense material or tissue within the left renal collecting system, nonemergent follow-up contrasted exam may be considered. 4. Slightly thick-walled urinary bladder with mild stranding/perivesical inflammatory change Electronically Signed   By: Donavan Foil M.D.   On: 05/08/2021 20:33    Scheduled Meds:  sodium chloride   Intravenous Once   apixaban  5 mg Oral BID   atenolol  50 mg Oral Daily   And   chlorthalidone  25 mg Oral Daily   furosemide  80 mg Oral BID   gentamicin cream  1 application Topical Daily   multivitamin  1 tablet Oral Daily   pantoprazole (PROTONIX) IV  40 mg Intravenous Q24H   sodium bicarbonate  1,300 mg Oral TID   sodium chloride flush  3 mL Intravenous Q12H   tamsulosin  0.4 mg Oral Daily   vancomycin  1,250 mg Intraperitoneal Once   vancomycin variable dose per unstable renal function (pharmacist dosing)   Does not apply See admin instructions   Continuous Infusions:  sodium chloride 75 mL/hr at 05/09/21 0214   dialysis solution 1.5% low-MG/low-CA     diltiazem (CARDIZEM) infusion 7.5 mg/hr (05/09/21 0449)     LOS: 1 day    Time spent: [place], MD [facility]   If 7PM-7AM, please contact night-coverage

## 2021-05-09 NOTE — Progress Notes (Signed)
Pharmacy Antibiotic Note  Rodney Gordon is a 65 y.o. male with ESRD on PD, admitted on 05/08/2021 with peritonitis.  Pharmacy has been consulted for Vancomycin dosing.  Plan: Pt given initial Vancomycin dose of 1500 mg (~ 19 mg/kg).  Pharmacy will continue to follow and assess daily and will ordered additional doses as warranted.  Height: 6' (182.9 cm) Weight: 77.6 kg (171 lb) IBW/kg (Calculated) : 77.6  Temp (24hrs), Avg:97.7 F (36.5 C), Min:97.7 F (36.5 C), Max:97.7 F (36.5 C)  Recent Labs  Lab 05/08/21 1618  WBC 23.8*  CREATININE 12.79*    Estimated Creatinine Clearance: 6.4 mL/min (A) (by C-G formula based on SCr of 12.79 mg/dL (H)).    No Known Allergies  Antimicrobials this admission: 10/18 Vancomycin >>   Microbiology results: 10/18 BCx: Pending  Thank you for allowing pharmacy to be a part of this patient's care.  Renda Rolls, PharmD, MBA 05/09/2021 12:30 AM

## 2021-05-10 DIAGNOSIS — R1030 Lower abdominal pain, unspecified: Secondary | ICD-10-CM | POA: Diagnosis not present

## 2021-05-10 LAB — TYPE AND SCREEN
ABO/RH(D): O POS
Antibody Screen: NEGATIVE
Unit division: 0

## 2021-05-10 LAB — MAGNESIUM: Magnesium: 1.5 mg/dL — ABNORMAL LOW (ref 1.7–2.4)

## 2021-05-10 LAB — CBC
HCT: 25.5 % — ABNORMAL LOW (ref 39.0–52.0)
Hemoglobin: 8.5 g/dL — ABNORMAL LOW (ref 13.0–17.0)
MCH: 31.8 pg (ref 26.0–34.0)
MCHC: 33.3 g/dL (ref 30.0–36.0)
MCV: 95.5 fL (ref 80.0–100.0)
Platelets: 403 10*3/uL — ABNORMAL HIGH (ref 150–400)
RBC: 2.67 MIL/uL — ABNORMAL LOW (ref 4.22–5.81)
RDW: 15.4 % (ref 11.5–15.5)
WBC: 13 10*3/uL — ABNORMAL HIGH (ref 4.0–10.5)
nRBC: 0 % (ref 0.0–0.2)

## 2021-05-10 LAB — BPAM RBC
Blood Product Expiration Date: 202211162359
ISSUE DATE / TIME: 202210191742
Unit Type and Rh: 5100

## 2021-05-10 LAB — HEMOGLOBIN A1C
Hgb A1c MFr Bld: <4.2 % — ABNORMAL LOW (ref 4.8–5.6)
Mean Plasma Glucose: <74 mg/dL

## 2021-05-10 LAB — BASIC METABOLIC PANEL
Anion gap: 10 (ref 5–15)
BUN: 51 mg/dL — ABNORMAL HIGH (ref 8–23)
CO2: 26 mmol/L (ref 22–32)
Calcium: 8.1 mg/dL — ABNORMAL LOW (ref 8.9–10.3)
Chloride: 93 mmol/L — ABNORMAL LOW (ref 98–111)
Creatinine, Ser: 12.78 mg/dL — ABNORMAL HIGH (ref 0.61–1.24)
GFR, Estimated: 4 mL/min — ABNORMAL LOW (ref >60)
Glucose, Bld: 113 mg/dL — ABNORMAL HIGH (ref 70–99)
Potassium: 3.5 mmol/L (ref 3.5–5.1)
Sodium: 129 mmol/L — ABNORMAL LOW (ref 135–145)

## 2021-05-10 LAB — VANCOMYCIN, TROUGH: Vancomycin Tr: 32 ug/mL (ref 15–20)

## 2021-05-10 LAB — PHOSPHORUS: Phosphorus: 3.7 mg/dL (ref 2.5–4.6)

## 2021-05-10 MED ORDER — MAGNESIUM SULFATE 2 GM/50ML IV SOLN
2.0000 g | Freq: Once | INTRAVENOUS | Status: AC
  Administered 2021-05-10: 2 g via INTRAVENOUS
  Filled 2021-05-10: qty 50

## 2021-05-10 MED ORDER — DILTIAZEM HCL ER COATED BEADS 120 MG PO CP24
120.0000 mg | ORAL_CAPSULE | Freq: Every day | ORAL | Status: DC
  Administered 2021-05-10 – 2021-05-12 (×3): 120 mg via ORAL
  Filled 2021-05-10 (×3): qty 1

## 2021-05-10 NOTE — Progress Notes (Addendum)
[facility]  ROUNDING NOTE   Subjective:   Rodney Gordon is a 65 year old male with past medical history of anemia, CAD, hypertension, PVD, and end-stage renal disease on peritoneal dialysis.  He presents to the emergency room today with complaints of abdominal pain and was directed by nephrologist for further evaluation.  He will be admitted for Peritonitis [facility]) [K65.9] Abdominal pain [R10.9]   Patient is known to our clinic and receives outpatient dialysis at [facility], supervised by Dr. Holley Raring.    Patient resting in bed Continues to complains of abdominal pain during dialysis PD treatment completed. Cloudy dialysate  Objective:  Vital signs in last 24 hours:  Temp:  [97.7 F (36.5 C)-98.7 F (37.1 C)] 98 F (36.7 C) (10/20 1421) Pulse Rate:  [50-89] 88 (10/20 1421) Resp:  [16-20] 16 (10/20 1421) BP: (112-142)/(72-92) 142/90 (10/20 1421) SpO2:  [97 %-100 %] 100 % (10/20 1421) Weight:  [70.3 kg-76.4 kg] 76.4 kg (10/19 2049)  Weight change: -7.303 kg Filed Weights   05/08/21 1615 05/09/21 1450 05/09/21 2049  Weight: 77.6 kg 70.3 kg 76.4 kg    Intake/Output: I/O last 3 completed shifts: In: 684 [P.O.:200; Blood:484] Out: -    Intake/Output this shift:  Total I/O In: [id number] [P.O.:240; Other:14500] Out: [id number] [Other:14490]  Physical Exam: General: NAD  Head: Normocephalic, atraumatic. Moist oral mucosal membranes  Eyes: Anicteric  Lungs:  Clear to auscultation, normal effort  Heart: Irregular rhythm  Abdomen:  Soft, nontender, nondistended  Extremities: No peripheral edema.  Neurologic: Alert/oriented moving all four extremities  Skin: No lesions  Access: PD catheter    Basic Metabolic Panel: Recent Labs  Lab 05/08/21 1616 05/08/21 1618 05/10/21 0539  NA  --  128* 129*  K  --  4.9 3.5  CL  --  90* 93*  CO2  --  27 26  GLUCOSE  --  115* 113*  BUN  --  53* 51*  CREATININE  --  12.79* 12.78*  CALCIUM  --  8.9 8.1*  MG 1.5*  --  1.5*   PHOS  --   --  3.7     Liver Function Tests: Recent Labs  Lab 05/08/21 1618  AST 22  ALT 6  ALKPHOS 159*  BILITOT 3.3*  PROT 7.1  ALBUMIN 2.2*    Recent Labs  Lab 05/08/21 1618  LIPASE 22    No results for input(s): AMMONIA in the last 168 hours.  CBC: Recent Labs  Lab 05/08/21 1618 05/09/21 0748 05/10/21 0539  WBC 23.8* 18.5* 13.0*  HGB 8.2* 6.9* 8.5*  HCT 24.4* 21.2* 25.5*  MCV 101.2* 100.0 95.5  PLT 583* 497* 403*     Cardiac Enzymes: No results for input(s): CKTOTAL, CKMB, CKMBINDEX, TROPONINI in the last 168 hours.  BNP: Invalid input(s): POCBNP  CBG: No results for input(s): GLUCAP in the last 168 hours.  Microbiology: Results for orders placed or performed during the hospital encounter of 05/08/21  Resp Panel by RT-PCR (Flu A&B, Covid) Nasopharyngeal Swab     Status: None   Collection Time: 05/08/21 10:05 PM   Specimen: Nasopharyngeal Swab; Nasopharyngeal(NP) swabs in vial transport medium  Result Value Ref Range Status   SARS Coronavirus 2 by RT PCR NEGATIVE NEGATIVE Final    Comment: (NOTE) SARS-CoV-2 target nucleic acids are NOT DETECTED.  The SARS-CoV-2 RNA is generally detectable in upper respiratory specimens during the acute phase of infection. The lowest concentration of SARS-CoV-2 viral copies this assay can detect is 138  copies/mL. A negative result does not preclude SARS-Cov-2 infection and should not be used as the sole basis for treatment or other patient management decisions. A negative result may occur with  improper specimen collection/handling, submission of specimen other than nasopharyngeal swab, presence of viral mutation(s) within the areas targeted by this assay, and inadequate number of viral copies(<138 copies/mL). A negative result must be combined with clinical observations, patient history, and epidemiological information. The expected result is Negative.  Fact Sheet for Patients:   EntrepreneurPulse.com.au  Fact Sheet for Healthcare Providers:  IncredibleEmployment.be  This test is no t yet approved or cleared by the [place] FDA and  has been authorized for detection and/or diagnosis of SARS-CoV-2 by FDA under an Emergency Use Authorization (EUA). This EUA will remain  in effect (meaning this test can be used) for the duration of the COVID-19 declaration under Section 564(b)(1) of the Act, 21 U.S.C.section 360bbb-3(b)(1), unless the authorization is terminated  or revoked sooner.       Influenza A by PCR NEGATIVE NEGATIVE Final   Influenza B by PCR NEGATIVE NEGATIVE Final    Comment: (NOTE) The Xpert Xpress SARS-CoV-2/FLU/RSV plus assay is intended as an aid in the diagnosis of influenza from Nasopharyngeal swab specimens and should not be used as a sole basis for treatment. Nasal washings and aspirates are unacceptable for Xpert Xpress SARS-CoV-2/FLU/RSV testing.  Fact Sheet for Patients: EntrepreneurPulse.com.au  Fact Sheet for Healthcare Providers: IncredibleEmployment.be  This test is not yet approved or cleared by the [place] FDA and has been authorized for detection and/or diagnosis of SARS-CoV-2 by FDA under an Emergency Use Authorization (EUA). This EUA will remain in effect (meaning this test can be used) for the duration of the COVID-19 declaration under Section 564(b)(1) of the Act, 21 U.S.C. section 360bbb-3(b)(1), unless the authorization is terminated or revoked.  Performed at [facility], [address] [place], [place] [address]   Blood culture (routine x 2)     Status: None (Preliminary result)   Collection Time: 05/08/21 10:05 PM   Specimen: BLOOD  Result Value Ref Range Status   Specimen Description BLOOD LEFT FOREARM  Final   Special Requests   Final    BOTTLES DRAWN AEROBIC AND ANAEROBIC Blood Culture results may not be optimal due  to an inadequate volume of blood received in culture bottles   Culture   Final    NO GROWTH 2 DAYS Performed at [facility], [address] [place], [place] [address]    Report Status PENDING  Incomplete  Blood culture (routine x 2)     Status: None (Preliminary result)   Collection Time: 05/08/21 10:05 PM   Specimen: BLOOD  Result Value Ref Range Status   Specimen Description BLOOD LEFT FOREARM  Final   Special Requests   Final    BOTTLES DRAWN AEROBIC AND ANAEROBIC Blood Culture results may not be optimal due to an inadequate volume of blood received in culture bottles   Culture   Final    NO GROWTH 2 DAYS Performed at [facility], [address], [place], [place] [address]    Report Status PENDING  Incomplete  Body fluid culture w Gram Stain     Status: None (Preliminary result)   Collection Time: 05/09/21  9:40 PM   Specimen: Peritoneal Dialysate; Body Fluid  Result Value Ref Range Status   Specimen Description PERITONEAL DIALYSATE  Final   Special Requests NONE  Final   Gram Stain  Final    MANY WBC SEEN NO ORGANISMS SEEN NO RBC SEEN Performed at [facility], [address] [place], [place] [address]    Culture PENDING  Incomplete   Report Status PENDING  Incomplete    Coagulation Studies: No results for input(s): LABPROT, INR in the last 72 hours.  Urinalysis: No results for input(s): COLORURINE, LABSPEC, PHURINE, GLUCOSEU, HGBUR, BILIRUBINUR, KETONESUR, PROTEINUR, UROBILINOGEN, NITRITE, LEUKOCYTESUR in the last 72 hours.  Invalid input(s): APPERANCEUR    Imaging: CT ABDOMEN PELVIS WO CONTRAST  Result Date: 05/08/2021 CLINICAL DATA:  Dialysis nausea vomiting diarrhea EXAM: CT ABDOMEN AND PELVIS WITHOUT CONTRAST TECHNIQUE: Multidetector CT imaging of the abdomen and pelvis was performed following the standard protocol without IV contrast. COMPARISON:  CT 04/11/2021 FINDINGS: Lower chest: Lung bases demonstrate no acute  consolidation or effusion. Coronary vascular calcification. Trace pericardial effusion. Hepatobiliary: No calcified gallstone. No focal hepatic abnormality. No biliary dilatation. Pancreas: Unremarkable. No pancreatic ductal dilatation or surrounding inflammatory changes. Spleen: Normal in size without focal abnormality. Adrenals/Urinary Tract: Adrenal glands are within normal limits. Atrophic kidneys without hydronephrosis. Stable exophytic cyst upper pole left kidney. Small stones and intrarenal vascular calcification on the right. Mild dilatation of left renal collecting system with possible hyperdense material in the left renal pelvis. No obstructing stone. Thick-walled urinary bladder with stranding. Stomach/Bowel: Stomach nonenlarged. No dilated small bowel. No acute bowel wall thickening. Partially visualized negative appendix. Vascular/Lymphatic: Advanced aortic atherosclerosis. No aneurysm. No suspicious nodes Reproductive: Coarse prostate calcifications. Calcified seminal vesicles. Other: Small amount of free fluid surrounding the liver and spleen and also within the right lower quadrant. Small amount of free air in the upper abdomen. Peritoneal dialysis catheter coiled in the right lower quadrant. Musculoskeletal: No acute or suspicious osseous abnormality. IMPRESSION: 1. Negative for bowel obstruction or bowel inflammatory process. 2. Peritoneal dialysis catheter coiled in the right lower quadrant. There is small amount of free fluid in the upper abdomen as well as the right lower quadrant in addition to small amount of pneumoperitoneum which is felt related to peritoneal dialysis catheter. These findings have been present on the comparison studies. 3. Atrophic native kidneys. Small stones in the right kidney. Slightly dilated left renal collecting system with possible hyperdense material or tissue within the left renal collecting system, nonemergent follow-up contrasted exam may be considered. 4.  Slightly thick-walled urinary bladder with mild stranding/perivesical inflammatory change Electronically Signed   By: Donavan Foil M.D.   On: 05/08/2021 20:33     Medications:    dialysis solution 1.5% low-MG/low-CA      apixaban  5 mg Oral BID   atenolol  50 mg Oral Daily   And   chlorthalidone  25 mg Oral Daily   diltiazem  120 mg Oral Daily   furosemide  80 mg Oral BID   gentamicin cream  1 application Topical Daily   multivitamin  1 tablet Oral Daily   pantoprazole (PROTONIX) IV  40 mg Intravenous Q24H   saccharomyces boulardii  250 mg Oral BID   sodium bicarbonate  1,300 mg Oral TID   sodium chloride flush  3 mL Intravenous Q12H   tamsulosin  0.4 mg Oral Daily   vancomycin variable dose per unstable renal function (pharmacist dosing)   Does not apply See admin instructions   acetaminophen, nitroGLYCERIN, oxyCODONE-acetaminophen  Assessment/ Plan:  [name] is a 65 y.o.  male with past medical history of anemia, CAD, hypertension, PVD, and end-stage renal disease on peritoneal dialysis.  He presents to the emergency room today with complaints of abdominal pain and was directed by nephrologist for further evaluation.  He will be admitted for Peritonitis ([place]) [K65.9] Abdominal pain [R10.9]  [facility]- 4 cycles/3L fills/ no last fill/ 20/10  End-stage renal disease on peritoneal dialysis.  Will maintain outpatient nightly schedule.  Reports pain with dialysis overnight. Will continue nightly treatments with no last fill.  2. Anemia of chronic kidney disease Lab Results  Component Value Date   HGB 8.5 (L) 05/10/2021  Hemoglobin remains below target but improved with 1 unit RBCs transfused yesterday. Will consider subQ EPO.  3. Secondary Hyperparathyroidism:  Lab Results  Component Value Date   CALCIUM 8.1 (L) 05/10/2021   CAION 1.13 (L) 09/05/2017   PHOS 3.7 05/10/2021  Calcium below target. Phosphorus at goal Calcium acetate with  meals Will monitor bone minerals during this admission  4.  Hypertension with chronic kidney disease.  Home regimen includes atenolol, diltiazem, furosemide, losartan, and isosorbide. Losartan held. BP 142/90. Continue current treatment  5.  Acute peritonitis.  IV Vanc ordered. Considered peritoneal Vancomycin dwell but due to elevated Vanc trough, will hold. Cultures obtained on 05/09/21 pending. Recently treated outpatient for MRSE peritonitis.     LOS: 2 Ercel Pepitone 10/20/20222:43 PM

## 2021-05-10 NOTE — Consult Note (Addendum)
Infectious Disease     Reason for Consult:Peritoneal cath infection    Referring Physician: Dr Dwyane Dee Date of Admission:  05/08/2021   Principal Problem:   Abdominal pain Active Problems:   Essential hypertension, benign   ESRD (end stage renal disease) ([place])   Fever and chills   Peritonitis ([place])   Atrial fibrillation with RVR ([place])   Anemia   HPI: Rodney Gordon is a 65 y.o. male with a history of end-stage renal disease on peritoneal dialysis for the last several years.  He was previously on hemodialysis but he was having hypotension so could not tolerate it.  He has been having abdominal pain for about a month.  He was seen at different emergency room's and at [facility] was found to have methicillin resistant  staph epidermidis growing in peritoneal fluid.  On that fluid on September 21 there were 7482 white cells with 89% PMNs.  He had a white blood count at that admission of 29.9.  He was apparently started on vancomycin intraperitoneally but it there was some question as to how compliant he was with it.  He presented now due to continued and increasing pain. On admission his white count was 23,000 but he was afebrile.  White count is now down to 13,000.   Blood cultures have been negative.  Peritoneal fluid gram stain showed many white cells but no organisms seen. CT of the abdomen showed peritoneal dialysis in the right lower quadrant with a small amount of free fluid in the upper quadrant as well as right lower quadrant.  There is a small amount of pneumoperitoneum.  Slightly thickened urinary bladder wall with mild stranding and perivesicular inflammatory changes.  Past Medical History:  Diagnosis Date   Anemia of chronic disease    Aortic insufficiency    Mild to moderate   Coronary atherosclerosis of native coronary artery    Nonobstructive at catheterization 2004   ESRD on hemodialysis [facility])    [name]   Essential hypertension    Peripheral vascular disease [facility])    Past  Surgical History:  Procedure Laterality Date   A/V FISTULAGRAM Right 09/05/2017   Procedure: A/V FISTULAGRAM;  Surgeon: Angelia Mould, MD;  Location: [place] [facility];  Service: Cardiovascular;  Laterality: Right;   AV FISTULA PLACEMENT Right 12/17/2016   Procedure: ARTERIOVENOUS (AV) FISTULA CREATION- RIGHT BRACHIOCEPHALIC;  Surgeon: Elam Dutch, MD;  Location: [place] OR;  Service: Vascular;  Laterality: Right;   CAPD INSERTION N/A 11/03/2017   Procedure: LAPAROSCOPIC INSERTION CONTINUOUS AMBULATORY PERITONEAL DIALYSIS  (CAPD) CATHETER;  Surgeon: Clovis Riley, MD;  Location: [place];  Service: General;  Laterality: N/A;   CARDIAC CATHETERIZATION     at [facility]   IR DIALY SHUNT INTRO [place] W/IMG RIGHT Right 04/24/2017   IR US GUIDE VASC ACCESS RIGHT  04/24/2017   PERIPHERAL VASCULAR BALLOON ANGIOPLASTY Right 09/05/2017   Procedure: PERIPHERAL VASCULAR BALLOON ANGIOPLASTY;  Surgeon: Angelia Mould, MD;  Location: [place] [facility];  Service: Cardiovascular;  Laterality: Right;   PROSTATE SURGERY     TIBIA FRACTURE SURGERY     Left   Social History   Tobacco Use   Smoking status: Former    Packs/day: 1.00    Years: 25.00    Pack years: 25.00    Types: Cigarettes    Start date: 07/07/1981    Quit date: 07/22/1998    Years since quitting: 22.8   Smokeless tobacco: Never  Vaping Use  Vaping Use: Never used  Substance Use Topics   Alcohol use: No    Alcohol/week: 0.0 standard drinks   Drug use: No   Family History  Problem Relation Age of Onset   Cancer Other    Coronary artery disease Other     Allergies: No Known Allergies  Current antibiotics: Antibiotics Given (last 72 hours)     Date/Time Action Medication Dose Rate   05/08/21 2311 New Bag/Given   vancomycin (VANCOREADY) IVPB 1500 mg/300 mL 1,500 mg 150 mL/hr       MEDICATIONS:  apixaban  5 mg Oral BID   atenolol  50 mg Oral Daily   And   chlorthalidone  25 mg Oral Daily    diltiazem  120 mg Oral Daily   furosemide  80 mg Oral BID   gentamicin cream  1 application Topical Daily   multivitamin  1 tablet Oral Daily   pantoprazole (PROTONIX) IV  40 mg Intravenous Q24H   saccharomyces boulardii  250 mg Oral BID   sodium bicarbonate  1,300 mg Oral TID   sodium chloride flush  3 mL Intravenous Q12H   tamsulosin  0.4 mg Oral Daily   vancomycin variable dose per unstable renal function (pharmacist dosing)   Does not apply See admin instructions    Review of Systems - 11 systems reviewed and negative per HPI   OBJECTIVE: Temp:  [97.7 F (36.5 C)-98.7 F (37.1 C)] 98.2 F (36.8 C) (10/20 1222) Pulse Rate:  [50-97] 81 (10/20 1222) Resp:  [16-20] 16 (10/20 1222) BP: (112-138)/(66-92) 118/72 (10/20 1222) SpO2:  [97 %-100 %] 97 % (10/20 1222) Weight:  [70.3 kg-76.4 kg] 76.4 kg (10/19 2049) Physical Exam  Constitutional: He is oriented to person, place, and time. Thin, chronically ill appearing HENT: anicteric Mouth/Throat: Oropharynx is clear and moist. No oropharyngeal exudate.  Cardiovascular: Normal rate, regular rhythm and normal heart sounds. Pulmonary/Chest: Effort normal and breath sounds normal. No respiratory distress. He has no wheezes.  Abdominal: Soft. Mild diffuse ttp. PD cath LLQ wnl Lymphadenopathy:  He has no cervical adenopathy.  Neurological: He is alert and oriented to person, place, and time.  Skin: Skin is warm and dry. No rash noted. No erythema.  Psychiatric: He has a normal mood and affect. His behavior is normal.     LABS: Results for orders placed or performed during the hospital encounter of 05/08/21 (from the past 48 hour(s))  Hemoglobin A1c     Status: Abnormal   Collection Time: 05/08/21  4:16 PM  Result Value Ref Range   Hgb A1c MFr Bld <4.2 (L) 4.8 - 5.6 %    Comment: (NOTE) **Verified by repeat analysis**         Prediabetes: 5.7 - 6.4         Diabetes: >6.4         Glycemic control for adults with diabetes: <7.0     Mean Plasma Glucose <74 mg/dL    Comment: (NOTE) Performed At: [facility] [place], [place] [id number] Rush Farmer MD PI:[id number]   Magnesium     Status: Abnormal   Collection Time: 05/08/21  4:16 PM  Result Value Ref Range   Magnesium 1.5 (L) 1.7 - 2.4 mg/dL    Comment: Performed at [facility], [address] [place], [place] [address]  Lipase, blood     Status: None   Collection Time: 05/08/21  4:18 PM  Result Value Ref Range   Lipase 22 11 -  51 U/L    Comment: Performed at [facility], [address] [place], [place] [address]  Comprehensive metabolic panel     Status: Abnormal   Collection Time: 05/08/21  4:18 PM  Result Value Ref Range   Sodium 128 (L) 135 - 145 mmol/L   Potassium 4.9 3.5 - 5.1 mmol/L   Chloride 90 (L) 98 - 111 mmol/L   CO2 27 22 - 32 mmol/L   Glucose, Bld 115 (H) 70 - 99 mg/dL    Comment: Glucose reference range applies only to samples taken after fasting for at least 8 hours.   BUN 53 (H) 8 - 23 mg/dL   Creatinine, Ser 12.79 (H) 0.61 - 1.24 mg/dL   Calcium 8.9 8.9 - 10.3 mg/dL   Total Protein 7.1 6.5 - 8.1 g/dL   Albumin 2.2 (L) 3.5 - 5.0 g/dL   AST 22 15 - 41 U/L   ALT 6 0 - 44 U/L   Alkaline Phosphatase 159 (H) 38 - 126 U/L   Total Bilirubin 3.3 (H) 0.3 - 1.2 mg/dL   GFR, Estimated 4 (L) >60 mL/min    Comment: (NOTE) Calculated using the CKD-EPI Creatinine Equation (2021)    Anion gap 11 5 - 15    Comment: Performed at [facility], [address] [place], [place] [address]  CBC     Status: Abnormal   Collection Time: 05/08/21  4:18 PM  Result Value Ref Range   WBC 23.8 (H) 4.0 - 10.5 K/uL   RBC 2.41 (L) 4.22 - 5.81 MIL/uL   Hemoglobin 8.2 (L) 13.0 - 17.0 g/dL   HCT 24.4 (L) 39.0 - 52.0 %   MCV 101.2 (H) 80.0 - 100.0 fL   MCH 34.0 26.0 - 34.0 pg   MCHC 33.6 30.0 - 36.0 g/dL   RDW 14.2 11.5 - 15.5 %   Platelets 583 (H) 150 - 400 K/uL   nRBC 0.0 0.0 - 0.2 %    Comment:  Performed at [facility], [address], [place], [place] [address]  Resp Panel by RT-PCR (Flu A&B, Covid) Nasopharyngeal Swab     Status: None   Collection Time: 05/08/21 10:05 PM   Specimen: Nasopharyngeal Swab; Nasopharyngeal(NP) swabs in vial transport medium  Result Value Ref Range   SARS Coronavirus 2 by RT PCR NEGATIVE NEGATIVE    Comment: (NOTE) SARS-CoV-2 target nucleic acids are NOT DETECTED.  The SARS-CoV-2 RNA is generally detectable in upper respiratory specimens during the acute phase of infection. The lowest concentration of SARS-CoV-2 viral copies this assay can detect is 138 copies/mL. A negative result does not preclude SARS-Cov-2 infection and should not be used as the sole basis for treatment or other patient management decisions. A negative result may occur with  improper specimen collection/handling, submission of specimen other than nasopharyngeal swab, presence of viral mutation(s) within the areas targeted by this assay, and inadequate number of viral copies(<138 copies/mL). A negative result must be combined with clinical observations, patient history, and epidemiological information. The expected result is Negative.  Fact Sheet for Patients:  EntrepreneurPulse.com.au  Fact Sheet for Healthcare Providers:  IncredibleEmployment.be  This test is no t yet approved or cleared by the [place] FDA and  has been authorized for detection and/or diagnosis of SARS-CoV-2 by FDA under an Emergency Use Authorization (EUA). This EUA will remain  in effect (meaning this test can be used) for the duration of the COVID-19 declaration under Section 564(b)(1) of the Act, 21  U.S.C.section 360bbb-3(b)(1), unless the authorization is terminated  or revoked sooner.       Influenza A by PCR NEGATIVE NEGATIVE   Influenza B by PCR NEGATIVE NEGATIVE    Comment: (NOTE) The Xpert Xpress SARS-CoV-2/FLU/RSV plus assay is intended  as an aid in the diagnosis of influenza from Nasopharyngeal swab specimens and should not be used as a sole basis for treatment. Nasal washings and aspirates are unacceptable for Xpert Xpress SARS-CoV-2/FLU/RSV testing.  Fact Sheet for Patients: EntrepreneurPulse.com.au  Fact Sheet for Healthcare Providers: IncredibleEmployment.be  This test is not yet approved or cleared by the [place] FDA and has been authorized for detection and/or diagnosis of SARS-CoV-2 by FDA under an Emergency Use Authorization (EUA). This EUA will remain in effect (meaning this test can be used) for the duration of the COVID-19 declaration under Section 564(b)(1) of the Act, 21 U.S.C. section 360bbb-3(b)(1), unless the authorization is terminated or revoked.  Performed at [facility], [address] [place], [place] [address]   Blood culture (routine x 2)     Status: None (Preliminary result)   Collection Time: 05/08/21 10:05 PM   Specimen: BLOOD  Result Value Ref Range   Specimen Description BLOOD LEFT FOREARM    Special Requests      BOTTLES DRAWN AEROBIC AND ANAEROBIC Blood Culture results may not be optimal due to an inadequate volume of blood received in culture bottles   Culture      NO GROWTH 2 DAYS Performed at [facility], [address] [place], [place] [address]    Report Status PENDING   Blood culture (routine x 2)     Status: None (Preliminary result)   Collection Time: 05/08/21 10:05 PM   Specimen: BLOOD  Result Value Ref Range   Specimen Description BLOOD LEFT FOREARM    Special Requests      BOTTLES DRAWN AEROBIC AND ANAEROBIC Blood Culture results may not be optimal due to an inadequate volume of blood received in culture bottles   Culture      NO GROWTH 2 DAYS Performed at [facility], [address] [place], [place] [address]    Report Status PENDING   CBC     Status: Abnormal   Collection Time:  05/09/21  7:48 AM  Result Value Ref Range   WBC 18.5 (H) 4.0 - 10.5 K/uL   RBC 2.12 (L) 4.22 - 5.81 MIL/uL   Hemoglobin 6.9 (L) 13.0 - 17.0 g/dL   HCT 21.2 (L) 39.0 - 52.0 %   MCV 100.0 80.0 - 100.0 fL   MCH 32.5 26.0 - 34.0 pg   MCHC 32.5 30.0 - 36.0 g/dL   RDW 14.2 11.5 - 15.5 %   Platelets 497 (H) 150 - 400 K/uL   nRBC 0.0 0.0 - 0.2 %    Comment: Performed at [facility], [address] [place], [place] [address]  HIV Antibody (routine testing w rflx)     Status: None   Collection Time: 05/09/21  7:48 AM  Result Value Ref Range   HIV Screen 4th Generation wRfx Non Reactive Non Reactive    Comment: Performed at [facility], [place] [address] [place], [place] [address]  ABO/Rh     Status: None   Collection Time: 05/09/21  7:48 AM  Result Value Ref Range   ABO/RH(D)      O POS Performed at [facility], [address] [place],  [address]   Prepare RBC (  crossmatch)     Status: None   Collection Time: 05/09/21  8:55 AM  Result Value Ref Range   Order Confirmation      ORDER PROCESSED BY BLOOD BANK Performed at [facility], [address] [place], [place] [address]   Type and screen     Status: None   Collection Time: 05/09/21 12:29 PM  Result Value Ref Range   ABO/RH(D) O POS    Antibody Screen NEG    Sample Expiration 05/12/2021,2359    Unit Number [id number]    Blood Component Type RED CELLS,LR    Unit division 00    Status of Unit ISSUED,FINAL    Transfusion Status OK TO TRANSFUSE    Crossmatch Result      Compatible Performed at [facility], [place], [place] [address]   Body fluid culture w Gram Stain     Status: None (Preliminary result)   Collection Time: 05/09/21  9:40 PM   Specimen: Peritoneal Dialysate; Body Fluid  Result Value Ref Range   Specimen Description PERITONEAL DIALYSATE    Special Requests NONE    Gram Stain      MANY WBC SEEN NO ORGANISMS SEEN NO RBC SEEN Performed  at [facility], [address] [place], [place] [address]    Culture PENDING    Report Status PENDING   CBC     Status: Abnormal   Collection Time: 05/10/21  5:39 AM  Result Value Ref Range   WBC 13.0 (H) 4.0 - 10.5 K/uL   RBC 2.67 (L) 4.22 - 5.81 MIL/uL   Hemoglobin 8.5 (L) 13.0 - 17.0 g/dL   HCT 25.5 (L) 39.0 - 52.0 %   MCV 95.5 80.0 - 100.0 fL   MCH 31.8 26.0 - 34.0 pg   MCHC 33.3 30.0 - 36.0 g/dL   RDW 15.4 11.5 - 15.5 %   Platelets 403 (H) 150 - 400 K/uL   nRBC 0.0 0.0 - 0.2 %    Comment: Performed at [facility], [address], [place], [place] [address]  Basic metabolic panel     Status: Abnormal   Collection Time: 05/10/21  5:39 AM  Result Value Ref Range   Sodium 129 (L) 135 - 145 mmol/L   Potassium 3.5 3.5 - 5.1 mmol/L   Chloride 93 (L) 98 - 111 mmol/L   CO2 26 22 - 32 mmol/L   Glucose, Bld 113 (H) 70 - 99 mg/dL    Comment: Glucose reference range applies only to samples taken after fasting for at least 8 hours.   BUN 51 (H) 8 - 23 mg/dL   Creatinine, Ser 12.78 (H) 0.61 - 1.24 mg/dL   Calcium 8.1 (L) 8.9 - 10.3 mg/dL   GFR, Estimated 4 (L) >60 mL/min    Comment: (NOTE) Calculated using the CKD-EPI Creatinine Equation (2021)    Anion gap 10 5 - 15    Comment: Performed at [facility], [address] [place], [place] [address]  Phosphorus     Status: None   Collection Time: 05/10/21  5:39 AM  Result Value Ref Range   Phosphorus 3.7 2.5 - 4.6 mg/dL    Comment: Performed at [facility], [address] [place],  [address]  Magnesium     Status: Abnormal   Collection Time: 05/10/21  5:39 AM  Result Value Ref Range   Magnesium 1.5 (L) 1.7 - 2.4 mg/dL    Comment: Performed at [facility], [place], [place] [address]  Vancomycin, trough     Status: Abnormal   Collection Time: 05/10/21  9:16 AM  Result Value Ref Range   Vancomycin Tr 32 (HH) 15 - 20 ug/mL    Comment: CRITICAL RESULT  CALLED TO, READ BACK BY AND VERIFIED WITH RAQUEL RODRIGUEZ 1002 05/10/21 MU Performed at [facility], [address] [place], [place] [address]    No components found for: ESR, C REACTIVE PROTEIN MICRO: Recent Results (from the past 720 hour(s))  Resp Panel by RT-PCR (Flu A&B, Covid) Nasopharyngeal Swab     Status: None   Collection Time: 05/08/21 10:05 PM   Specimen: Nasopharyngeal Swab; Nasopharyngeal(NP) swabs in vial transport medium  Result Value Ref Range Status   SARS Coronavirus 2 by RT PCR NEGATIVE NEGATIVE Final    Comment: (NOTE) SARS-CoV-2 target nucleic acids are NOT DETECTED.  The SARS-CoV-2 RNA is generally detectable in upper respiratory specimens during the acute phase of infection. The lowest concentration of SARS-CoV-2 viral copies this assay can detect is 138 copies/mL. A negative result does not preclude SARS-Cov-2 infection and should not be used as the sole basis for treatment or other patient management decisions. A negative result may occur with  improper specimen collection/handling, submission of specimen other than nasopharyngeal swab, presence of viral mutation(s) within the areas targeted by this assay, and inadequate number of viral copies(<138 copies/mL). A negative result must be combined with clinical observations, patient history, and epidemiological information. The expected result is Negative.  Fact Sheet for Patients:  EntrepreneurPulse.com.au  Fact Sheet for Healthcare Providers:  IncredibleEmployment.be  This test is no t yet approved or cleared by the [place] FDA and  has been authorized for detection and/or diagnosis of SARS-CoV-2 by FDA under an Emergency Use Authorization (EUA). This EUA will remain  in effect (meaning this test can be used) for the duration of the COVID-19 declaration under Section 564(b)(1) of the Act, 21 U.S.C.section 360bbb-3(b)(1), unless the authorization is  terminated  or revoked sooner.       Influenza A by PCR NEGATIVE NEGATIVE Final   Influenza B by PCR NEGATIVE NEGATIVE Final    Comment: (NOTE) The Xpert Xpress SARS-CoV-2/FLU/RSV plus assay is intended as an aid in the diagnosis of influenza from Nasopharyngeal swab specimens and should not be used as a sole basis for treatment. Nasal washings and aspirates are unacceptable for Xpert Xpress SARS-CoV-2/FLU/RSV testing.  Fact Sheet for Patients: EntrepreneurPulse.com.au  Fact Sheet for Healthcare Providers: IncredibleEmployment.be  This test is not yet approved or cleared by the [place] FDA and has been authorized for detection and/or diagnosis of SARS-CoV-2 by FDA under an Emergency Use Authorization (EUA). This EUA will remain in effect (meaning this test can be used) for the duration of the COVID-19 declaration under Section 564(b)(1) of the Act, 21 U.S.C. section 360bbb-3(b)(1), unless the authorization is terminated or revoked.  Performed at [facility], [address] [place], [place] [address]   Blood culture (routine x 2)     Status: None (Preliminary result)   Collection Time: 05/08/21 10:05 PM   Specimen: BLOOD  Result Value Ref Range Status   Specimen Description BLOOD LEFT FOREARM  Final   Special Requests   Final    BOTTLES DRAWN AEROBIC AND ANAEROBIC Blood Culture results may not be optimal due to an inadequate volume of blood received in culture bottles   Culture   Final    NO GROWTH 2  DAYS Performed at [facility], [address] [place], [place] [address]    Report Status PENDING  Incomplete  Blood culture (routine x 2)     Status: None (Preliminary result)   Collection Time: 05/08/21 10:05 PM   Specimen: BLOOD  Result Value Ref Range Status   Specimen Description BLOOD LEFT FOREARM  Final   Special Requests   Final    BOTTLES DRAWN AEROBIC AND ANAEROBIC Blood Culture results may not be  optimal due to an inadequate volume of blood received in culture bottles   Culture   Final    NO GROWTH 2 DAYS Performed at [facility], [address], [place], [place] [address]    Report Status PENDING  Incomplete  Body fluid culture w Gram Stain     Status: None (Preliminary result)   Collection Time: 05/09/21  9:40 PM   Specimen: Peritoneal Dialysate; Body Fluid  Result Value Ref Range Status   Specimen Description PERITONEAL DIALYSATE  Final   Special Requests NONE  Final   Gram Stain   Final    MANY WBC SEEN NO ORGANISMS SEEN NO RBC SEEN Performed at [facility], [address], [place], [place] [address]    Culture PENDING  Incomplete   Report Status PENDING  Incomplete    IMAGING: CT ABDOMEN PELVIS WO CONTRAST  Result Date: 05/08/2021 CLINICAL DATA:  Dialysis nausea vomiting diarrhea EXAM: CT ABDOMEN AND PELVIS WITHOUT CONTRAST TECHNIQUE: Multidetector CT imaging of the abdomen and pelvis was performed following the standard protocol without IV contrast. COMPARISON:  CT 04/11/2021 FINDINGS: Lower chest: Lung bases demonstrate no acute consolidation or effusion. Coronary vascular calcification. Trace pericardial effusion. Hepatobiliary: No calcified gallstone. No focal hepatic abnormality. No biliary dilatation. Pancreas: Unremarkable. No pancreatic ductal dilatation or surrounding inflammatory changes. Spleen: Normal in size without focal abnormality. Adrenals/Urinary Tract: Adrenal glands are within normal limits. Atrophic kidneys without hydronephrosis. Stable exophytic cyst upper pole left kidney. Small stones and intrarenal vascular calcification on the right. Mild dilatation of left renal collecting system with possible hyperdense material in the left renal pelvis. No obstructing stone. Thick-walled urinary bladder with stranding. Stomach/Bowel: Stomach nonenlarged. No dilated small bowel. No acute bowel wall thickening. Partially visualized negative  appendix. Vascular/Lymphatic: Advanced aortic atherosclerosis. No aneurysm. No suspicious nodes Reproductive: Coarse prostate calcifications. Calcified seminal vesicles. Other: Small amount of free fluid surrounding the liver and spleen and also within the right lower quadrant. Small amount of free air in the upper abdomen. Peritoneal dialysis catheter coiled in the right lower quadrant. Musculoskeletal: No acute or suspicious osseous abnormality. IMPRESSION: 1. Negative for bowel obstruction or bowel inflammatory process. 2. Peritoneal dialysis catheter coiled in the right lower quadrant. There is small amount of free fluid in the upper abdomen as well as the right lower quadrant in addition to small amount of pneumoperitoneum which is felt related to peritoneal dialysis catheter. These findings have been present on the comparison studies. 3. Atrophic native kidneys. Small stones in the right kidney. Slightly dilated left renal collecting system with possible hyperdense material or tissue within the left renal collecting system, nonemergent follow-up contrasted exam may be considered. 4. Slightly thick-walled urinary bladder with mild stranding/perivesical inflammatory change Electronically Signed   By: Donavan Foil M.D.   On: 05/08/2021 20:33    Assessment:   Rodney Gordon is a 65 y.o. male with end-stage renal disease on peritoneal dialysis for the last several years who has been having abdominal pain and  evidence of methicillin resistant  staph epidermidis peritoneal abscess catheter infection since he was seen September 21 at [facility].  He has been intermittently on vancomycin though the dosing is not change confirmed that he has been taking at all. On admission his white count was elevated but less than it had been in September.  Follow-up adrenal fluid has been sent for culture.  Recommendations Given the low virulence organism there is no immediate need to remove the peritoneal catheter. Continue IV and  PD Vancomycin Thank you very much for allowing me to participate in the care of this patient. Please call with questions.   Cheral Marker. Ola Spurr, MD

## 2021-05-10 NOTE — Progress Notes (Signed)
PROGRESS NOTE    Rodney Gordon  [id number] DOB: 1956/07/09 DOA: 05/08/2021 PCP: Neale Burly, MD    Brief Narrative:  This 65 years old male with PMH significant for aortic valve insufficiency, CAD, nonobstructive, ESRD on PD, hypertension, peripheral vascular disease, A. fib presented in the ED with complaint of abdominal pain.  He is currently being treated for peritonitis by his nephrologist and is still having abdominal pain and not feeling well.  Patient also reports fever and chills and vomiting he went to [facility] on 9/21 and left AMA as he did not have staff with his skills for peritoneal dialysis.  Patient has been having abdominal pain infection since last month, patient has multiple visits to the ED and left AGAINST MEDICAL ADVICE. Patient has been noncompliant with Eliquis but has been taking Cardizem for heart rate control only. Patient is admitted for peritonitis possibly due to peritoneal dialysis catheter infection.  Patient has received vancomycin, Nephrology and ID is consulted.  Assessment & Plan:   Principal Problem:   Abdominal pain Active Problems:   Essential hypertension, benign   ESRD (end stage renal disease) (HCC)   Fever and chills   Peritonitis (HCC)   Atrial fibrillation with RVR (HCC)   Anemia   Abdominal pain / Fever /chills secondary to peritonitis: Patient having ongoing abdominal pain, intermittent fever and chills secondary to peritonitis. Patient has been to ER multiple times and left AGAINST MEDICAL ADVICE. Most likely related to PD catheter infection. Continue vancomycin as per pharmacy protocol Adequate pain control. Nephrology consulted, Continue vancomycin MRSE grew in initial cultures from 04/17/2021. Follow up cultures  Resume PD treatments  Plan for intraperitoneal vancomycin.  Atrial fibrillation with RVR: New onset since August this year. Patient was discharged on Eliquis and Cardizem. Patient has not been taking  Eliquis but taking Cardizem. Heart rate is reasonably controlled, continue Cardizem CD Resume Eliquis.   ESRD on PD: Nephrology consulted,  Resumed on PD last night.   Essential hypertension: Continue minoxidil, tenormin,  Cardizem, Imdur, doxazosin. Hold Lasix and Cozaar.   Anemia of chronic disease: Hemoglobin dropped to 6.8 S/P 1 prbc,  Hb 8.4    DVT prophylaxis: Eliquis Code Status: Full code Family Communication: No family at bedside Disposition Plan:   Status is: Inpatient  Remains inpatient appropriate because: Needs IV antibiotics for peritonitis.  Anticipated discharge home in few days once infection resolves.  Consultants:  Nephrology  Procedures: Antimicrobials:   Anti-infectives (From admission, onward)    Start     Dose/Rate Route Frequency Ordered Stop   05/09/21 1700  vancomycin (VANCOCIN) 1,250 mg in dialysis solution 1.5% low-MG/low-CA 3,000 mL dialysis solution  Status:  Discontinued         Peritoneal Dialysis Once in dialysis 05/09/21 1614 05/09/21 1632   05/09/21 1645  vancomycin (VANCOCIN) 100 mg/mL injection 1,250 mg  Status:  Discontinued        1,250 mg Intraperitoneal  Once 05/09/21 1549 05/09/21 1614   05/09/21 0024  vancomycin variable dose per unstable renal function (pharmacist dosing)         Does not apply See admin instructions 05/09/21 0025     05/08/21 2200  vancomycin (VANCOREADY) IVPB 1500 mg/300 mL        1,500 mg 150 mL/hr over 120 Minutes Intravenous  Once 05/08/21 2150 05/09/21 0215       Subjective: Patient was seen and examined at bedside. Overnight events noted.   Patient was lying comfortably in the  bed, reports abdominal pain has resolved.   He was resumed on dialysis last night.  He denies any nausea, vomiting.  Objective: Vitals:   05/09/21 2257 05/10/21 0354 05/10/21 0830 05/10/21 1222  BP: 136/81 (!) 130/92 129/75 118/72  Pulse:  (!) 50 68 81  Resp:  '18 16 16  '$ Temp: 97.7 F (36.5 C) 98.7 F (37.1 C) 97.9 F  (36.6 C) 98.2 F (36.8 C)  TempSrc: Oral Oral    SpO2:  99% 100% 97%  Weight:      Height:        Intake/Output Summary (Last 24 hours) at 05/10/2021 1351 Last data filed at 05/10/2021 1000 Gross per 24 hour  Intake 15424 ml  Output 14490 ml  Net 934 ml   Filed Weights   05/08/21 1615 05/09/21 1450 05/09/21 2049  Weight: 77.6 kg 70.3 kg 76.4 kg    Examination:  General exam: Appears comfortable, not in any acute distress, deconditioned. Respiratory system: Clear to auscultation. Respiratory effort normal. Cardiovascular system: S1-S2 heard, irregular rhythm, no murmur.   Gastrointestinal system: Abdomen is soft, nontender, nondistended, BS +.  PD catheter noted, no erythema. Central nervous system: Alert and oriented x 3. No focal neurological deficits. Extremities: No edema, no cyanosis, no clubbing. Skin: No rashes, lesions or ulcers Psychiatry: Judgement and insight appear normal. Mood & affect appropriate.     Data Reviewed: I have personally reviewed following labs and imaging studies  CBC: Recent Labs  Lab 05/08/21 1618 05/09/21 0748 05/10/21 0539  WBC 23.8* 18.5* 13.0*  HGB 8.2* 6.9* 8.5*  HCT 24.4* 21.2* 25.5*  MCV 101.2* 100.0 95.5  PLT 583* 497* [id number]*   Basic Metabolic Panel: Recent Labs  Lab 05/08/21 1616 05/08/21 1618 05/10/21 0539  NA  --  128* 129*  K  --  4.9 3.5  CL  --  90* 93*  CO2  --  27 26  GLUCOSE  --  115* 113*  BUN  --  53* 51*  CREATININE  --  12.79* 12.78*  CALCIUM  --  8.9 8.1*  MG 1.5*  --  1.5*  PHOS  --   --  3.7   GFR: Estimated Creatinine Clearance: 6.3 mL/min (A) (by C-G formula based on SCr of 12.78 mg/dL (H)). Liver Function Tests: Recent Labs  Lab 05/08/21 1618  AST 22  ALT 6  ALKPHOS 159*  BILITOT 3.3*  PROT 7.1  ALBUMIN 2.2*   Recent Labs  Lab 05/08/21 1618  LIPASE 22   No results for input(s): AMMONIA in the last 168 hours. Coagulation Profile: No results for input(s): INR, PROTIME in the last  168 hours. Cardiac Enzymes: No results for input(s): CKTOTAL, CKMB, CKMBINDEX, TROPONINI in the last 168 hours. BNP (last 3 results) No results for input(s): PROBNP in the last 8760 hours. HbA1C: Recent Labs    05/08/21 1616  HGBA1C <4.2*   CBG: No results for input(s): GLUCAP in the last 168 hours. Lipid Profile: No results for input(s): CHOL, HDL, LDLCALC, TRIG, CHOLHDL, LDLDIRECT in the last 72 hours. Thyroid Function Tests: No results for input(s): TSH, T4TOTAL, FREET4, T3FREE, THYROIDAB in the last 72 hours. Anemia Panel: No results for input(s): VITAMINB12, FOLATE, FERRITIN, TIBC, IRON, RETICCTPCT in the last 72 hours. Sepsis Labs: No results for input(s): PROCALCITON, LATICACIDVEN in the last 168 hours.  Recent Results (from the past 240 hour(s))  Resp Panel by RT-PCR (Flu A&B, Covid) Nasopharyngeal Swab     Status: None   Collection Time:  05/08/21 10:05 PM   Specimen: Nasopharyngeal Swab; Nasopharyngeal(NP) swabs in vial transport medium  Result Value Ref Range Status   SARS Coronavirus 2 by RT PCR NEGATIVE NEGATIVE Final    Comment: (NOTE) SARS-CoV-2 target nucleic acids are NOT DETECTED.  The SARS-CoV-2 RNA is generally detectable in upper respiratory specimens during the acute phase of infection. The lowest concentration of SARS-CoV-2 viral copies this assay can detect is 138 copies/mL. A negative result does not preclude SARS-Cov-2 infection and should not be used as the sole basis for treatment or other patient management decisions. A negative result may occur with  improper specimen collection/handling, submission of specimen other than nasopharyngeal swab, presence of viral mutation(s) within the areas targeted by this assay, and inadequate number of viral copies(<138 copies/mL). A negative result must be combined with clinical observations, patient history, and epidemiological information. The expected result is Negative.  Fact Sheet for Patients:   EntrepreneurPulse.com.au  Fact Sheet for Healthcare Providers:  IncredibleEmployment.be  This test is no t yet approved or cleared by the [place] FDA and  has been authorized for detection and/or diagnosis of SARS-CoV-2 by FDA under an Emergency Use Authorization (EUA). This EUA will remain  in effect (meaning this test can be used) for the duration of the COVID-19 declaration under Section 564(b)(1) of the Act, 21 U.S.C.section 360bbb-3(b)(1), unless the authorization is terminated  or revoked sooner.       Influenza A by PCR NEGATIVE NEGATIVE Final   Influenza B by PCR NEGATIVE NEGATIVE Final    Comment: (NOTE) The Xpert Xpress SARS-CoV-2/FLU/RSV plus assay is intended as an aid in the diagnosis of influenza from Nasopharyngeal swab specimens and should not be used as a sole basis for treatment. Nasal washings and aspirates are unacceptable for Xpert Xpress SARS-CoV-2/FLU/RSV testing.  Fact Sheet for Patients: EntrepreneurPulse.com.au  Fact Sheet for Healthcare Providers: IncredibleEmployment.be  This test is not yet approved or cleared by the [place] FDA and has been authorized for detection and/or diagnosis of SARS-CoV-2 by FDA under an Emergency Use Authorization (EUA). This EUA will remain in effect (meaning this test can be used) for the duration of the COVID-19 declaration under Section 564(b)(1) of the Act, 21 U.S.C. section 360bbb-3(b)(1), unless the authorization is terminated or revoked.  Performed at [facility], [address] [place], [place] [address]   Blood culture (routine x 2)     Status: None (Preliminary result)   Collection Time: 05/08/21 10:05 PM   Specimen: BLOOD  Result Value Ref Range Status   Specimen Description BLOOD LEFT FOREARM  Final   Special Requests   Final    BOTTLES DRAWN AEROBIC AND ANAEROBIC Blood Culture results may not be optimal due  to an inadequate volume of blood received in culture bottles   Culture   Final    NO GROWTH 2 DAYS Performed at [facility], [address], [place], [place] [address]    Report Status PENDING  Incomplete  Blood culture (routine x 2)     Status: None (Preliminary result)   Collection Time: 05/08/21 10:05 PM   Specimen: BLOOD  Result Value Ref Range Status   Specimen Description BLOOD LEFT FOREARM  Final   Special Requests   Final    BOTTLES DRAWN AEROBIC AND ANAEROBIC Blood Culture results may not be optimal due to an inadequate volume of blood received in culture bottles   Culture   Final    NO GROWTH 2 DAYS Performed at [facility], [address], [place], [place] [address]    Report Status PENDING  Incomplete  Body fluid culture w Gram Stain     Status: None (Preliminary result)   Collection Time: 05/09/21  9:40 PM   Specimen: Peritoneal Dialysate; Body Fluid  Result Value Ref Range Status   Specimen Description PERITONEAL DIALYSATE  Final   Special Requests NONE  Final   Gram Stain   Final    MANY WBC SEEN NO ORGANISMS SEEN NO RBC SEEN Performed at [facility], [address] [place], [place] [address]    Culture PENDING  Incomplete   Report Status PENDING  Incomplete    Radiology Studies: CT ABDOMEN PELVIS WO CONTRAST  Result Date: 05/08/2021 CLINICAL DATA:  Dialysis nausea vomiting diarrhea EXAM: CT ABDOMEN AND PELVIS WITHOUT CONTRAST TECHNIQUE: Multidetector CT imaging of the abdomen and pelvis was performed following the standard protocol without IV contrast. COMPARISON:  CT 04/11/2021 FINDINGS: Lower chest: Lung bases demonstrate no acute consolidation or effusion. Coronary vascular calcification. Trace pericardial effusion. Hepatobiliary: No calcified gallstone. No focal hepatic abnormality. No biliary dilatation. Pancreas: Unremarkable. No pancreatic ductal dilatation or surrounding inflammatory changes. Spleen: Normal in size without  focal abnormality. Adrenals/Urinary Tract: Adrenal glands are within normal limits. Atrophic kidneys without hydronephrosis. Stable exophytic cyst upper pole left kidney. Small stones and intrarenal vascular calcification on the right. Mild dilatation of left renal collecting system with possible hyperdense material in the left renal pelvis. No obstructing stone. Thick-walled urinary bladder with stranding. Stomach/Bowel: Stomach nonenlarged. No dilated small bowel. No acute bowel wall thickening. Partially visualized negative appendix. Vascular/Lymphatic: Advanced aortic atherosclerosis. No aneurysm. No suspicious nodes Reproductive: Coarse prostate calcifications. Calcified seminal vesicles. Other: Small amount of free fluid surrounding the liver and spleen and also within the right lower quadrant. Small amount of free air in the upper abdomen. Peritoneal dialysis catheter coiled in the right lower quadrant. Musculoskeletal: No acute or suspicious osseous abnormality. IMPRESSION: 1. Negative for bowel obstruction or bowel inflammatory process. 2. Peritoneal dialysis catheter coiled in the right lower quadrant. There is small amount of free fluid in the upper abdomen as well as the right lower quadrant in addition to small amount of pneumoperitoneum which is felt related to peritoneal dialysis catheter. These findings have been present on the comparison studies. 3. Atrophic native kidneys. Small stones in the right kidney. Slightly dilated left renal collecting system with possible hyperdense material or tissue within the left renal collecting system, nonemergent follow-up contrasted exam may be considered. 4. Slightly thick-walled urinary bladder with mild stranding/perivesical inflammatory change Electronically Signed   By: Donavan Foil M.D.   On: 05/08/2021 20:33    Scheduled Meds:  apixaban  5 mg Oral BID   atenolol  50 mg Oral Daily   And   chlorthalidone  25 mg Oral Daily   diltiazem  120 mg Oral  Daily   furosemide  80 mg Oral BID   gentamicin cream  1 application Topical Daily   multivitamin  1 tablet Oral Daily   pantoprazole (PROTONIX) IV  40 mg Intravenous Q24H   saccharomyces boulardii  250 mg Oral BID   sodium bicarbonate  1,300 mg Oral TID   sodium chloride flush  3 mL Intravenous Q12H   tamsulosin  0.4 mg Oral Daily   vancomycin variable dose per unstable renal function (pharmacist dosing)   Does not apply See admin instructions   Continuous Infusions:  dialysis solution 1.5% low-MG/low-CA  LOS: 2 days    Time spent: [place], MD [facility]   If 7PM-7AM, please contact night-coverage

## 2021-05-11 DIAGNOSIS — K659 Peritonitis, unspecified: Secondary | ICD-10-CM | POA: Diagnosis not present

## 2021-05-11 DIAGNOSIS — I4891 Unspecified atrial fibrillation: Secondary | ICD-10-CM | POA: Diagnosis not present

## 2021-05-11 DIAGNOSIS — N186 End stage renal disease: Secondary | ICD-10-CM | POA: Diagnosis not present

## 2021-05-11 DIAGNOSIS — A419 Sepsis, unspecified organism: Secondary | ICD-10-CM | POA: Diagnosis present

## 2021-05-11 LAB — BASIC METABOLIC PANEL
Anion gap: 10 (ref 5–15)
BUN: 48 mg/dL — ABNORMAL HIGH (ref 8–23)
CO2: 30 mmol/L (ref 22–32)
Calcium: 8.1 mg/dL — ABNORMAL LOW (ref 8.9–10.3)
Chloride: 92 mmol/L — ABNORMAL LOW (ref 98–111)
Creatinine, Ser: 12.13 mg/dL — ABNORMAL HIGH (ref 0.61–1.24)
GFR, Estimated: 4 mL/min — ABNORMAL LOW (ref >60)
Glucose, Bld: 110 mg/dL — ABNORMAL HIGH (ref 70–99)
Potassium: 4 mmol/L (ref 3.5–5.1)
Sodium: 132 mmol/L — ABNORMAL LOW (ref 135–145)

## 2021-05-11 LAB — VANCOMYCIN, RANDOM: Vancomycin Rm: 29

## 2021-05-11 MED ORDER — LACTULOSE 10 GM/15ML PO SOLN
20.0000 g | Freq: Once | ORAL | Status: DC
  Filled 2021-05-11 (×2): qty 30

## 2021-05-11 MED ORDER — MAGNESIUM SULFATE IN D5W 1-5 GM/100ML-% IV SOLN
1.0000 g | Freq: Once | INTRAVENOUS | Status: AC
  Administered 2021-05-11: 1 g via INTRAVENOUS
  Filled 2021-05-11: qty 100

## 2021-05-11 MED ORDER — PANTOPRAZOLE SODIUM 40 MG PO TBEC
40.0000 mg | DELAYED_RELEASE_TABLET | Freq: Every day | ORAL | Status: DC
  Administered 2021-05-11 – 2021-05-13 (×3): 40 mg via ORAL
  Filled 2021-05-11 (×3): qty 1

## 2021-05-11 NOTE — Progress Notes (Signed)
[facility]  ROUNDING NOTE   Subjective:   Rodney Gordon is a 65 year old male with past medical history of anemia, CAD, hypertension, PVD, and end-stage renal disease on peritoneal dialysis.  He presents to the emergency room today with complaints of abdominal pain and was directed by nephrologist for further evaluation.  He will be admitted for Peritonitis [facility]) [K65.9] Abdominal pain [R10.9]   Patient is known to our clinic and receives outpatient dialysis at [facility], supervised by Dr. Holley Raring.    Patient seen resting in bed Continues to complain of abdominal pain during dialysis Per HD RN, PD fluid remains cloudy with tissue sediment.   Objective:  Vital signs in last 24 hours:  Temp:  [97.6 F (36.4 C)-98.5 F (36.9 C)] 98.2 F (36.8 C) (10/21 1138) Pulse Rate:  [81-99] 99 (10/21 1138) Resp:  [16-20] 16 (10/21 1138) BP: (118-142)/(69-90) 126/74 (10/21 1138) SpO2:  [97 %-100 %] 99 % (10/21 1138) Weight:  [77.9 kg-80.5 kg] 77.9 kg (10/21 0358)  Weight change: 10.2 kg Filed Weights   05/09/21 2049 05/10/21 1953 05/11/21 0358  Weight: 76.4 kg 80.5 kg 77.9 kg    Intake/Output: I/O last 3 completed shifts: In: 15910.5 [P.O.:680; I.V.:246.5; Blood:484; QVZDG:38756] Out: 43329 [Urine:200; JJOAC:16606]   Intake/Output this shift:  Total I/O In: 12000 [Other:12000] Out: 14460 [Other:14460]  Physical Exam: General: NAD  Head: Normocephalic, atraumatic. Moist oral mucosal membranes  Eyes: Anicteric  Lungs:  Clear to auscultation, normal effort  Heart: Irregular rhythm  Abdomen:  Soft, nontender, nondistended  Extremities: No peripheral edema.  Neurologic: Alert/oriented moving all four extremities  Skin: No lesions  Access: PD catheter    Basic Metabolic Panel: Recent Labs  Lab 05/08/21 1616 05/08/21 1618 05/10/21 0539 05/11/21 0548  NA  --  128* 129* 132*  K  --  4.9 3.5 4.0  CL  --  90* 93* 92*  CO2  --  27 26 30   GLUCOSE  --  115*  113* 110*  BUN  --  53* 51* 48*  CREATININE  --  12.79* 12.78* 12.13*  CALCIUM  --  8.9 8.1* 8.1*  MG 1.5*  --  1.5*  --   PHOS  --   --  3.7  --      Liver Function Tests: Recent Labs  Lab 05/08/21 1618  AST 22  ALT 6  ALKPHOS 159*  BILITOT 3.3*  PROT 7.1  ALBUMIN 2.2*    Recent Labs  Lab 05/08/21 1618  LIPASE 22    No results for input(s): AMMONIA in the last 168 hours.  CBC: Recent Labs  Lab 05/08/21 1618 05/09/21 0748 05/10/21 0539  WBC 23.8* 18.5* 13.0*  HGB 8.2* 6.9* 8.5*  HCT 24.4* 21.2* 25.5*  MCV 101.2* 100.0 95.5  PLT 583* 497* 403*     Cardiac Enzymes: No results for input(s): CKTOTAL, CKMB, CKMBINDEX, TROPONINI in the last 168 hours.  BNP: Invalid input(s): POCBNP  CBG: No results for input(s): GLUCAP in the last 168 hours.  Microbiology: Results for orders placed or performed during the hospital encounter of 05/08/21  Resp Panel by RT-PCR (Flu A&B, Covid) Nasopharyngeal Swab     Status: None   Collection Time: 05/08/21 10:05 PM   Specimen: Nasopharyngeal Swab; Nasopharyngeal(NP) swabs in vial transport medium  Result Value Ref Range Status   SARS Coronavirus 2 by RT PCR NEGATIVE NEGATIVE Final    Comment: (NOTE) SARS-CoV-2 target nucleic acids are NOT DETECTED.  The SARS-CoV-2 RNA is  generally detectable in upper respiratory specimens during the acute phase of infection. The lowest concentration of SARS-CoV-2 viral copies this assay can detect is 138 copies/mL. A negative result does not preclude SARS-Cov-2 infection and should not be used as the sole basis for treatment or other patient management decisions. A negative result may occur with  improper specimen collection/handling, submission of specimen other than nasopharyngeal swab, presence of viral mutation(s) within the areas targeted by this assay, and inadequate number of viral copies(<138 copies/mL). A negative result must be combined with clinical observations, patient  history, and epidemiological information. The expected result is Negative.  Fact Sheet for Patients:  EntrepreneurPulse.com.au  Fact Sheet for Healthcare Providers:  IncredibleEmployment.be  This test is no t yet approved or cleared by the [place] FDA and  has been authorized for detection and/or diagnosis of SARS-CoV-2 by FDA under an Emergency Use Authorization (EUA). This EUA will remain  in effect (meaning this test can be used) for the duration of the COVID-19 declaration under Section 564(b)(1) of the Act, 21 U.S.C.section 360bbb-3(b)(1), unless the authorization is terminated  or revoked sooner.       Influenza A by PCR NEGATIVE NEGATIVE Final   Influenza B by PCR NEGATIVE NEGATIVE Final    Comment: (NOTE) The Xpert Xpress SARS-CoV-2/FLU/RSV plus assay is intended as an aid in the diagnosis of influenza from Nasopharyngeal swab specimens and should not be used as a sole basis for treatment. Nasal washings and aspirates are unacceptable for Xpert Xpress SARS-CoV-2/FLU/RSV testing.  Fact Sheet for Patients: EntrepreneurPulse.com.au  Fact Sheet for Healthcare Providers: IncredibleEmployment.be  This test is not yet approved or cleared by the [place] FDA and has been authorized for detection and/or diagnosis of SARS-CoV-2 by FDA under an Emergency Use Authorization (EUA). This EUA will remain in effect (meaning this test can be used) for the duration of the COVID-19 declaration under Section 564(b)(1) of the Act, 21 U.S.C. section 360bbb-3(b)(1), unless the authorization is terminated or revoked.  Performed at [facility], [address] [place], [place] [address]   Blood culture (routine x 2)     Status: None (Preliminary result)   Collection Time: 05/08/21 10:05 PM   Specimen: BLOOD  Result Value Ref Range Status   Specimen Description BLOOD LEFT FOREARM  Final    Special Requests   Final    BOTTLES DRAWN AEROBIC AND ANAEROBIC Blood Culture results may not be optimal due to an inadequate volume of blood received in culture bottles   Culture   Final    NO GROWTH 3 DAYS Performed at [facility], [address] [place], [place] [address]    Report Status PENDING  Incomplete  Blood culture (routine x 2)     Status: None (Preliminary result)   Collection Time: 05/08/21 10:05 PM   Specimen: BLOOD  Result Value Ref Range Status   Specimen Description BLOOD LEFT FOREARM  Final   Special Requests   Final    BOTTLES DRAWN AEROBIC AND ANAEROBIC Blood Culture results may not be optimal due to an inadequate volume of blood received in culture bottles   Culture   Final    NO GROWTH 3 DAYS Performed at [facility], [address] [place], [place] [address]    Report Status PENDING  Incomplete  Body fluid culture w Gram Stain     Status: None (Preliminary result)   Collection Time: 05/09/21  9:40 PM   Specimen: Peritoneal Dialysate; Body Fluid  Result Value Ref Range Status   Specimen Description   Final    PERITONEAL DIALYSATE Performed at [facility], [address], [place], [place] [address]    Special Requests   Final    NONE Performed at [facility], [address] [place], [place] [address]    Gram Stain   Final    MANY WBC SEEN NO ORGANISMS SEEN NO RBC SEEN Performed at [facility], [address] [place],  [address]    Culture   Final    NO GROWTH 1 DAY Performed at [facility], [place] [address], [place],  [address]    Report Status PENDING  Incomplete    Coagulation Studies: No results for input(s): LABPROT, INR in the last 72 hours.  Urinalysis: No results for input(s): COLORURINE, LABSPEC, PHURINE, GLUCOSEU, HGBUR, BILIRUBINUR, KETONESUR, PROTEINUR, UROBILINOGEN, NITRITE, LEUKOCYTESUR in the last 72 hours.  Invalid input(s): APPERANCEUR     Imaging: No results found.   Medications:    dialysis solution 1.5% low-MG/low-CA      apixaban  5 mg Oral BID   atenolol  50 mg Oral Daily   And   chlorthalidone  25 mg Oral Daily   diltiazem  120 mg Oral Daily   furosemide  80 mg Oral BID   gentamicin cream  1 application Topical Daily   multivitamin  1 tablet Oral Daily   pantoprazole (PROTONIX) IV  40 mg Intravenous Q24H   saccharomyces boulardii  250 mg Oral BID   sodium bicarbonate  1,300 mg Oral TID   sodium chloride flush  3 mL Intravenous Q12H   tamsulosin  0.4 mg Oral Daily   vancomycin variable dose per unstable renal function (pharmacist dosing)   Does not apply See admin instructions   acetaminophen, nitroGLYCERIN, oxyCODONE-acetaminophen  Assessment/ Plan:  [name] is a 65 y.o.  male with past medical history of anemia, CAD, hypertension, PVD, and end-stage renal disease on peritoneal dialysis.  He presents to the emergency room today with complaints of abdominal pain and was directed by nephrologist for further evaluation.  He will be admitted for Peritonitis ([place]) [K65.9] Abdominal pain [R10.9]  [facility] CCP[facility]- 4 cycles/3L fills/ no last fill/ 20/10  End-stage renal disease on peritoneal dialysis.  Will maintain outpatient nightly schedule.  Discomfort with treatment. Will continue nightly treatments with no last fill.  2. Anemia of chronic kidney disease Lab Results  Component Value Date   HGB 8.5 (L) 05/10/2021  Hemoglobin remains below target. Will prescribe EPO 20k units subQ weekly.  3. Secondary Hyperparathyroidism:  Lab Results  Component Value Date   CALCIUM 8.1 (L) 05/11/2021   CAION 1.13 (L) 09/05/2017   PHOS 3.7 05/10/2021  Calcium remain below target. Phosphorus at goal Calcium acetate with meals  4.  Hypertension with chronic kidney disease.  Home regimen includes atenolol, diltiazem, furosemide, losartan, and isosorbide. Losartan held. BP 126/74.   5.   Acute peritonitis.  IV Vanc ordered. Cultures obtained on 05/09/21 pending. Recently treated outpatient for MRSE peritonitis. Will consider dosing peritoneal vancomycin for dwell once Vanc trough decreases. Will monitor    LOS: 3 Jamie Belger 10/21/202212:11 PM

## 2021-05-11 NOTE — Progress Notes (Signed)
PHARMACIST - PHYSICIAN COMMUNICATION  DR:   Roosevelt Locks  CONCERNING: IV to Oral Route Change Policy  RECOMMENDATION: This patient is receiving PANTOPRAZOLE by the intravenous route.  Based on criteria approved by the Pharmacy and Therapeutics Committee, the intravenous medication(s) is/are being converted to the equivalent oral dose form(s).   DESCRIPTION: These criteria include: The patient is eating (either orally or via tube) and/or has been taking other orally administered medications for a least 24 hours The patient has no evidence of active gastrointestinal bleeding or impaired GI absorption (gastrectomy, short bowel, patient on TNA or NPO).  If you have questions about this conversion, please contact the Pharmacy Department  []   [phone] )  [facility] [x]   [phone] )  [facility] []   [phone] )  [facility] []   [phone] )  [facility] []   [phone] )  Spalding Rodriguez-Guzman PharmD, BCPS 05/11/2021 3:58 PM

## 2021-05-11 NOTE — Progress Notes (Signed)
PROGRESS NOTE    Rodney Gordon  HQR:[id number] DOB: 1956-01-23 DOA: 05/08/2021 PCP: Neale Burly, MD    Brief Narrative:  This 65 years old male with PMH significant for aortic valve insufficiency, CAD, nonobstructive, ESRD on PD, hypertension, peripheral vascular disease, A. fib presented in the ED with complaint of abdominal pain.  He is currently being treated for peritonitis by his nephrologist and is still having abdominal pain and not feeling well.  Patient also reports fever and chills and vomiting he went to [facility] on 9/21 and left AMA as he did not have staff with his skills for peritoneal dialysis.  Patient has been having abdominal pain infection since last month, patient has multiple visits to the ED and left AGAINST MEDICAL ADVICE. Patient has been noncompliant with Eliquis but has been taking Cardizem for heart rate control only. Patient is admitted for peritonitis possibly due to peritoneal dialysis catheter infection.  Patient has received vancomycin, Nephrology and ID is consulted.     Assessment & Plan:   Principal Problem:   Abdominal pain Active Problems:   Essential hypertension, benign   ESRD (end stage renal disease) (HCC)   Fever and chills   Peritonitis (HCC)   Atrial fibrillation with RVR (HCC)   Anemia  Bacterial peritonitis secondary to methicillin-resistant staph epidermis. Sepsis secondary to bacterial peritonitis. I reviewed the chart, patient had a significant tachycardia and severe leukocytosis at the time admission.  He had bacteria peritonitis.  He met sepsis criteria. Repeat culture from peritoneal fluid had no growth. Spoke with ID, will continue IV vancomycin until Sunday.  Continue PD vancomycin now and after discharge. Condition is currently stable.  Persistent Atrial fibrillation with RVR. Patient has atrial fibrillation since August this year. Continue Eliquis and diltiazem, heart rate controlled.  End-stage renal disease on  peritoneal dialysis. Anemia of chronic kidney disease. Hyponatremia Hypomagnesemia. Continue PD per nephrology Patient had a 1 unit PRBC for anemia. Give 1 g magnesium sulfate.    DVT prophylaxis: Eliquis Code Status: full Family Communication:  Disposition Plan:    Status is: Inpatient  Remains inpatient appropriate because: Due to severity of disease, currently receiving IV vancomycin.        I/O last 3 completed shifts: In: 15910.5 [P.O.:680; I.V.:246.5; Blood:484; DIYME:15830] Out: 94076 [Urine:200; KGSUP:10315] Total I/O In: 12000 [Other:12000] Out: 94585 [Other:14460]     Consultants:  ID, nephrology.  Procedures: PD  Antimicrobials: Vancomycin. Subjective: Patient doing better, abdominal pain much improved.  No nausea vomiting.  He has been constipated, no bowel movement since admission. No fever or chills. No dysuria hematuria. No short of breath or cough.  Objective: Vitals:   05/10/21 2312 05/11/21 0358 05/11/21 0809 05/11/21 1138  BP: (!) 142/69 128/75 132/78 126/74  Pulse: 92 93 88 99  Resp: '20 20 16 16  ' Temp: 98.1 F (36.7 C) 97.6 F (36.4 C) 98.4 F (36.9 C) 98.2 F (36.8 C)  TempSrc: Oral Oral Oral Oral  SpO2: 98% 100% 100% 99%  Weight:  77.9 kg    Height:        Intake/Output Summary (Last 24 hours) at 05/11/2021 1421 Last data filed at 05/11/2021 0830 Gross per 24 hour  Intake 12486.53 ml  Output 14660 ml  Net -2173.47 ml   Filed Weights   05/09/21 2049 05/10/21 1953 05/11/21 0358  Weight: 76.4 kg 80.5 kg 77.9 kg    Examination:  General exam: Appears calm and comfortable  Respiratory system: Clear to auscultation. Respiratory effort  normal. Cardiovascular system: S1 & S2 heard, RRR. No JVD, murmurs, rubs, gallops or clicks. No pedal edema. Gastrointestinal system: Abdomen is nondistended, soft and nontender. No organomegaly or masses felt. Normal bowel sounds heard. Central nervous system: Alert and oriented. No focal  neurological deficits. Extremities: Symmetric 5 x 5 power. Skin: No rashes, lesions or ulcers Psychiatry: Judgement and insight appear normal. Mood & affect appropriate.     Data Reviewed: I have personally reviewed following labs and imaging studies  CBC: Recent Labs  Lab 05/08/21 1618 05/09/21 0748 05/10/21 0539  WBC 23.8* 18.5* 13.0*  HGB 8.2* 6.9* 8.5*  HCT 24.4* 21.2* 25.5*  MCV 101.2* 100.0 95.5  PLT 583* 497* 865*   Basic Metabolic Panel: Recent Labs  Lab 05/08/21 1616 05/08/21 1618 05/10/21 0539 05/11/21 0548  NA  --  128* 129* 132*  K  --  4.9 3.5 4.0  CL  --  90* 93* 92*  CO2  --  '27 26 30  ' GLUCOSE  --  115* 113* 110*  BUN  --  53* 51* 48*  CREATININE  --  12.79* 12.78* 12.13*  CALCIUM  --  8.9 8.1* 8.1*  MG 1.5*  --  1.5*  --   PHOS  --   --  3.7  --    GFR: Estimated Creatinine Clearance: 6.8 mL/min (A) (by C-G formula based on SCr of 12.13 mg/dL (H)). Liver Function Tests: Recent Labs  Lab 05/08/21 1618  AST 22  ALT 6  ALKPHOS 159*  BILITOT 3.3*  PROT 7.1  ALBUMIN 2.2*   Recent Labs  Lab 05/08/21 1618  LIPASE 22   No results for input(s): AMMONIA in the last 168 hours. Coagulation Profile: No results for input(s): INR, PROTIME in the last 168 hours. Cardiac Enzymes: No results for input(s): CKTOTAL, CKMB, CKMBINDEX, TROPONINI in the last 168 hours. BNP (last 3 results) No results for input(s): PROBNP in the last 8760 hours. HbA1C: Recent Labs    05/08/21 1616  HGBA1C <4.2*   CBG: No results for input(s): GLUCAP in the last 168 hours. Lipid Profile: No results for input(s): CHOL, HDL, LDLCALC, TRIG, CHOLHDL, LDLDIRECT in the last 72 hours. Thyroid Function Tests: No results for input(s): TSH, T4TOTAL, FREET4, T3FREE, THYROIDAB in the last 72 hours. Anemia Panel: No results for input(s): VITAMINB12, FOLATE, FERRITIN, TIBC, IRON, RETICCTPCT in the last 72 hours. Sepsis Labs: No results for input(s): PROCALCITON, LATICACIDVEN in  the last 168 hours.  Recent Results (from the past 240 hour(s))  Resp Panel by RT-PCR (Flu A&B, Covid) Nasopharyngeal Swab     Status: None   Collection Time: 05/08/21 10:05 PM   Specimen: Nasopharyngeal Swab; Nasopharyngeal(NP) swabs in vial transport medium  Result Value Ref Range Status   SARS Coronavirus 2 by RT PCR NEGATIVE NEGATIVE Final    Comment: (NOTE) SARS-CoV-2 target nucleic acids are NOT DETECTED.  The SARS-CoV-2 RNA is generally detectable in upper respiratory specimens during the acute phase of infection. The lowest concentration of SARS-CoV-2 viral copies this assay can detect is 138 copies/mL. A negative result does not preclude SARS-Cov-2 infection and should not be used as the sole basis for treatment or other patient management decisions. A negative result may occur with  improper specimen collection/handling, submission of specimen other than nasopharyngeal swab, presence of viral mutation(s) within the areas targeted by this assay, and inadequate number of viral copies(<138 copies/mL). A negative result must be combined with clinical observations, patient history, and epidemiological information. The expected  result is Negative.  Fact Sheet for Patients:  EntrepreneurPulse.com.au  Fact Sheet for Healthcare Providers:  IncredibleEmployment.be  This test is no t yet approved or cleared by the [place] FDA and  has been authorized for detection and/or diagnosis of SARS-CoV-2 by FDA under an Emergency Use Authorization (EUA). This EUA will remain  in effect (meaning this test can be used) for the duration of the COVID-19 declaration under Section 564(b)(1) of the Act, 21 U.S.C.section 360bbb-3(b)(1), unless the authorization is terminated  or revoked sooner.       Influenza A by PCR NEGATIVE NEGATIVE Final   Influenza B by PCR NEGATIVE NEGATIVE Final    Comment: (NOTE) The Xpert Xpress SARS-CoV-2/FLU/RSV plus assay  is intended as an aid in the diagnosis of influenza from Nasopharyngeal swab specimens and should not be used as a sole basis for treatment. Nasal washings and aspirates are unacceptable for Xpert Xpress SARS-CoV-2/FLU/RSV testing.  Fact Sheet for Patients: EntrepreneurPulse.com.au  Fact Sheet for Healthcare Providers: IncredibleEmployment.be  This test is not yet approved or cleared by the [place] FDA and has been authorized for detection and/or diagnosis of SARS-CoV-2 by FDA under an Emergency Use Authorization (EUA). This EUA will remain in effect (meaning this test can be used) for the duration of the COVID-19 declaration under Section 564(b)(1) of the Act, 21 U.S.C. section 360bbb-3(b)(1), unless the authorization is terminated or revoked.  Performed at [facility], [address] [place], [place] [address]   Blood culture (routine x 2)     Status: None (Preliminary result)   Collection Time: 05/08/21 10:05 PM   Specimen: BLOOD  Result Value Ref Range Status   Specimen Description BLOOD LEFT FOREARM  Final   Special Requests   Final    BOTTLES DRAWN AEROBIC AND ANAEROBIC Blood Culture results may not be optimal due to an inadequate volume of blood received in culture bottles   Culture   Final    NO GROWTH 3 DAYS Performed at [facility], [address] [place], [place] [address]    Report Status PENDING  Incomplete  Blood culture (routine x 2)     Status: None (Preliminary result)   Collection Time: 05/08/21 10:05 PM   Specimen: BLOOD  Result Value Ref Range Status   Specimen Description BLOOD LEFT FOREARM  Final   Special Requests   Final    BOTTLES DRAWN AEROBIC AND ANAEROBIC Blood Culture results may not be optimal due to an inadequate volume of blood received in culture bottles   Culture   Final    NO GROWTH 3 DAYS Performed at [facility], [address], [place], [place] [address]     Report Status PENDING  Incomplete  Body fluid culture w Gram Stain     Status: None (Preliminary result)   Collection Time: 05/09/21  9:40 PM   Specimen: Peritoneal Dialysate; Body Fluid  Result Value Ref Range Status   Specimen Description   Final    PERITONEAL DIALYSATE Performed at [facility], [address], [place], [place] [address]    Special Requests   Final    NONE Performed at [facility], [address] [place], [place] [address]    Gram Stain   Final    MANY WBC SEEN NO ORGANISMS SEEN NO RBC SEEN Performed at [facility], [address] [place], [place] [address]    Culture   Final    NO GROWTH 1 DAY Performed at  [facility], [place] [address] [place], [place] [address]    Report Status PENDING  Incomplete         Radiology Studies: No results found.      Scheduled Meds:  apixaban  5 mg Oral BID   atenolol  50 mg Oral Daily   And   chlorthalidone  25 mg Oral Daily   diltiazem  120 mg Oral Daily   furosemide  80 mg Oral BID   gentamicin cream  1 application Topical Daily   multivitamin  1 tablet Oral Daily   pantoprazole (PROTONIX) IV  40 mg Intravenous Q24H   saccharomyces boulardii  250 mg Oral BID   sodium bicarbonate  1,300 mg Oral TID   sodium chloride flush  3 mL Intravenous Q12H   tamsulosin  0.4 mg Oral Daily   vancomycin variable dose per unstable renal function (pharmacist dosing)   Does not apply See admin instructions   Continuous Infusions:  dialysis solution 1.5% low-MG/low-CA       LOS: 3 days    Time spent: 28 minutes    Sharen Hones, MD [facility]   To contact the attending provider between 7A-7P or the covering provider during after hours 7P-7A, please log into the web site [email or web address] and access using universal [place] password for that web site. If you do not have the password, please call the hospital operator.  05/11/2021, 2:21 PM

## 2021-05-11 NOTE — Progress Notes (Signed)
Pharmacy Antibiotic Note  Rodney Gordon is a 65 y.o. male with ESRD on CCPD, admitted on 05/08/2021 with nausea, diarrhea, and abdominal pain determined to have peritonitis. Pharmacy has been consulted for Vancomycin dosing.  Currently afebrile, with elevated WBC though improved today to 13.   On vancomycin via PD PTA. In the ED, given initial vancomycin IV 1500 mg x 1 (~19 mg/kg) on 10/18. Vanc random resulted 32 on 10/20, then downtrend to 29 on 10/21. Lvl likely elevated due PTA vancomycin.  Plan: Ordered vancomycin random for Sunday 10/23. Once vanc random approaches 20, will plan to give vancomycin 750 mg in dialysis fluid (3L).  No further plans for IV vancomycin. Plan to continue with vancomycin intraperitoneal.   Monitor CCPD plans, UOP for residual renal function, and vancomycin random levels every 3-4 days. Pharmacy will continue to follow and assess daily and will ordered additional doses as warranted.  Height: 6' (182.9 cm) Weight: 77.9 kg (171 lb 11.2 oz) IBW/kg (Calculated) : 77.6  Temp (24hrs), Avg:98.2 F (36.8 C), Min:97.6 F (36.4 C), Max:98.5 F (36.9 C)  Recent Labs  Lab 05/08/21 1618 05/09/21 0748 05/10/21 0539 05/10/21 0916 05/11/21 0548  WBC 23.8* 18.5* 13.0*  --   --   CREATININE 12.79*  --  12.78*  --  12.13*  VANCOTROUGH  --   --   --  32*  --   VANCORANDOM  --   --   --   --  29     Estimated Creatinine Clearance: 6.8 mL/min (A) (by C-G formula based on SCr of 12.13 mg/dL (H)).    No Known Allergies  Antimicrobials this admission: 10/18 Vancomycin >> (x 1 dose on 10/18)  Microbiology results: 10/18 BCx: NG x 3 days 10/19 Peritoneal: Many WBC, no organisms, no RBC; NG x 1 day  Thank you for allowing pharmacy to be a part of this patient's care.  Wynelle Cleveland, PharmD Pharmacy Resident  05/11/2021 1:32 PM

## 2021-05-11 NOTE — Progress Notes (Signed)
Infectious Disease     Reason for Consult:Peritonitis     Referring Physician: Dr Roosevelt Locks Date of Admission:  05/08/2021   Principal Problem:   Abdominal pain Active Problems:   Essential hypertension, benign   ESRD (end stage renal disease) ([place])   Fever and chills   Peritonitis ([place])   Atrial fibrillation with RVR ([place])   Anemia   Sepsis ([place])   HPI: Rodney Gordon is a 65 y.o. male with PD cath infection. Feels a lot better.   Past Medical History:  Diagnosis Date   Anemia of chronic disease    Aortic insufficiency    Mild to moderate   Coronary atherosclerosis of native coronary artery    Nonobstructive at catheterization 2004   ESRD on hemodialysis [facility])    [name]   Essential hypertension    Peripheral vascular disease [facility])    Past Surgical History:  Procedure Laterality Date   A/V FISTULAGRAM Right 09/05/2017   Procedure: A/V FISTULAGRAM;  Surgeon: Angelia Mould, MD;  Location: [place] [facility];  Service: Cardiovascular;  Laterality: Right;   AV FISTULA PLACEMENT Right 12/17/2016   Procedure: ARTERIOVENOUS (AV) FISTULA CREATION- RIGHT BRACHIOCEPHALIC;  Surgeon: Elam Dutch, MD;  Location: [place] [facility];  Service: Vascular;  Laterality: Right;   CAPD INSERTION N/A 11/03/2017   Procedure: LAPAROSCOPIC INSERTION CONTINUOUS AMBULATORY PERITONEAL DIALYSIS  (CAPD) CATHETER;  Surgeon: Clovis Riley, MD;  Location: [place];  Service: General;  Laterality: N/A;   CARDIAC CATHETERIZATION     at [facility]   IR DIALY SHUNT INTRO [place] W/IMG RIGHT Right 04/24/2017   IR US GUIDE VASC ACCESS RIGHT  04/24/2017   PERIPHERAL VASCULAR BALLOON ANGIOPLASTY Right 09/05/2017   Procedure: PERIPHERAL VASCULAR BALLOON ANGIOPLASTY;  Surgeon: Angelia Mould, MD;  Location: [place] [facility];  Service: Cardiovascular;  Laterality: Right;   PROSTATE SURGERY     TIBIA FRACTURE SURGERY     Left   Social History   Tobacco Use   Smoking status: Former     Packs/day: 1.00    Years: 25.00    Pack years: 25.00    Types: Cigarettes    Start date: 07/07/1981    Quit date: 07/22/1998    Years since quitting: 22.8   Smokeless tobacco: Never  Vaping Use   Vaping Use: Never used  Substance Use Topics   Alcohol use: No    Alcohol/week: 0.0 standard drinks   Drug use: No   Family History  Problem Relation Age of Onset   Cancer Other    Coronary artery disease Other     Allergies: No Known Allergies  Current antibiotics: Antibiotics Given (last 72 hours)     Date/Time Action Medication Dose Rate   05/08/21 2311 New Bag/Given   vancomycin (VANCOREADY) IVPB 1500 mg/300 mL 1,500 mg 150 mL/hr       MEDICATIONS:  apixaban  5 mg Oral BID   atenolol  50 mg Oral Daily   And   chlorthalidone  25 mg Oral Daily   diltiazem  120 mg Oral Daily   furosemide  80 mg Oral BID   gentamicin cream  1 application Topical Daily   lactulose  20 g Oral Once   multivitamin  1 tablet Oral Daily   pantoprazole  40 mg Oral QHS   saccharomyces boulardii  250 mg Oral BID   sodium bicarbonate  1,300 mg Oral TID   sodium chloride flush  3 mL Intravenous Q12H  tamsulosin  0.4 mg Oral Daily   vancomycin variable dose per unstable renal function (pharmacist dosing)   Does not apply See admin instructions    Review of Systems - 11 systems reviewed and negative per HPI   OBJECTIVE: Temp:  [97.6 F (36.4 C)-98.5 F (36.9 C)] 98 F (36.7 C) (10/21 1701) Pulse Rate:  [86-99] 90 (10/21 1701) Resp:  [16-20] 18 (10/21 1701) BP: (125-142)/(69-90) 136/78 (10/21 1701) SpO2:  [98 %-100 %] 98 % (10/21 1701) Weight:  [77.9 kg-80.5 kg] 77.9 kg (10/21 0358)   LABS: Results for orders placed or performed during the hospital encounter of 05/08/21 (from the past 48 hour(s))  Body fluid culture w Gram Stain     Status: None (Preliminary result)   Collection Time: 05/09/21  9:40 PM   Specimen: Peritoneal Dialysate; Body Fluid  Result Value Ref Range   Specimen  Description      PERITONEAL DIALYSATE Performed at [facility], [address] [place], [place] [address]    Special Requests      NONE Performed at [place], [place] [address]    Gram Stain      MANY WBC SEEN NO ORGANISMS SEEN NO RBC SEEN Performed at [facility], [address], [place], [place] [address]    Culture      NO GROWTH 1 DAY Performed at [facility], [place] [address], [place], [place] [address]    Report Status PENDING   CBC     Status: Abnormal   Collection Time: 05/10/21  5:39 AM  Result Value Ref Range   WBC 13.0 (H) 4.0 - 10.5 K/uL   RBC 2.67 (L) 4.22 - 5.81 MIL/uL   Hemoglobin 8.5 (L) 13.0 - 17.0 g/dL   HCT 25.5 (L) 39.0 - 52.0 %   MCV 95.5 80.0 - 100.0 fL   MCH 31.8 26.0 - 34.0 pg   MCHC 33.3 30.0 - 36.0 g/dL   RDW 15.4 11.5 - 15.5 %   Platelets 403 (H) 150 - 400 K/uL   nRBC 0.0 0.0 - 0.2 %    Comment: Performed at [facility], [address], [place], [place] [address]  Basic metabolic panel     Status: Abnormal   Collection Time: 05/10/21  5:39 AM  Result Value Ref Range   Sodium 129 (L) 135 - 145 mmol/L   Potassium 3.5 3.5 - 5.1 mmol/L   Chloride 93 (L) 98 - 111 mmol/L   CO2 26 22 - 32 mmol/L   Glucose, Bld 113 (H) 70 - 99 mg/dL    Comment: Glucose reference range applies only to samples taken after fasting for at least 8 hours.   BUN 51 (H) 8 - 23 mg/dL   Creatinine, Ser 12.78 (H) 0.61 - 1.24 mg/dL   Calcium 8.1 (L) 8.9 - 10.3 mg/dL   GFR, Estimated 4 (L) >60 mL/min    Comment: (NOTE) Calculated using the CKD-EPI Creatinine Equation (2021)    Anion gap 10 5 - 15    Comment: Performed at [facility], [address] [place], [place] [address]  Phosphorus     Status: None   Collection Time: 05/10/21  5:39 AM  Result Value Ref Range   Phosphorus 3.7 2.5 - 4.6 mg/dL    Comment: Performed at [facility], [address], [place], [place]  [address]  Magnesium     Status: Abnormal   Collection Time: 05/10/21  5:39 AM  Result Value Ref Range   Magnesium 1.5 (L) 1.7 - 2.4 mg/dL    Comment: Performed at [facility], [address] [place], [place] [address]  Vancomycin, trough     Status: Abnormal   Collection Time: 05/10/21  9:16 AM  Result Value Ref Range   Vancomycin Tr 32 (HH) 15 - 20 ug/mL    Comment: CRITICAL RESULT CALLED TO, READ BACK BY AND VERIFIED WITH RAQUEL RODRIGUEZ 1002 05/10/21 MU Performed at [facility], [address] [place], [place] [address]   Vancomycin, random     Status: None   Collection Time: 05/11/21  5:48 AM  Result Value Ref Range   Vancomycin Rm 29     Comment:        Random Vancomycin therapeutic range is dependent on dosage and time of specimen collection. A peak range is 20.0-40.0 ug/mL A trough range is 5.0-15.0 ug/mL        Performed at [facility], [address] [place], [place] [address]   Basic metabolic panel     Status: Abnormal   Collection Time: 05/11/21  5:48 AM  Result Value Ref Range   Sodium 132 (L) 135 - 145 mmol/L   Potassium 4.0 3.5 - 5.1 mmol/L   Chloride 92 (L) 98 - 111 mmol/L   CO2 30 22 - 32 mmol/L   Glucose, Bld 110 (H) 70 - 99 mg/dL    Comment: Glucose reference range applies only to samples taken after fasting for at least 8 hours.   BUN 48 (H) 8 - 23 mg/dL   Creatinine, Ser 12.13 (H) 0.61 - 1.24 mg/dL   Calcium 8.1 (L) 8.9 - 10.3 mg/dL   GFR, Estimated 4 (L) >60 mL/min    Comment: (NOTE) Calculated using the CKD-EPI Creatinine Equation (2021)    Anion gap 10 5 - 15    Comment: Performed at  Hospital Lab, [address] [place],  [address]   No components found for: ESR, C REACTIVE PROTEIN MICRO: Recent Results (from the past 720 hour(s))  Resp Panel by RT-PCR (Flu A&B, Covid) Nasopharyngeal Swab     Status: None   Collection Time: 05/08/21 10:05 PM   Specimen: Nasopharyngeal Swab;  Nasopharyngeal(NP) swabs in vial transport medium  Result Value Ref Range Status   SARS Coronavirus 2 by RT PCR NEGATIVE NEGATIVE Final    Comment: (NOTE) SARS-CoV-2 target nucleic acids are NOT DETECTED.  The SARS-CoV-2 RNA is generally detectable in upper respiratory specimens during the acute phase of infection. The lowest concentration of SARS-CoV-2 viral copies this assay can detect is 138 copies/mL. A negative result does not preclude SARS-Cov-2 infection and should not be used as the sole basis for treatment or other patient management decisions. A negative result may occur with  improper specimen collection/handling, submission of specimen other than nasopharyngeal swab, presence of viral mutation(s) within the areas targeted by this assay, and inadequate number of viral copies(<138 copies/mL). A negative result must be combined with clinical observations, patient history, and epidemiological information. The expected result is Negative.  Fact Sheet for Patients:  https://www.fda.gov/media/152166/download  Fact Sheet for Healthcare Providers:  https://www.fda.gov/media/152162/download  This test is no t yet approved or cleared by the [place] FDA and  has been authorized for detection and/or diagnosis of SARS-CoV-2 by FDA under an Emergency Use Authorization (EUA). This EUA will remain  in effect (meaning this test can be used) for the duration of the COVID-19 declaration under   Section 564(b)(1) of the Act, 21 U.S.C.section 360bbb-3(b)(1), unless the authorization is terminated  or revoked sooner.       Influenza A by PCR NEGATIVE NEGATIVE Final   Influenza B by PCR NEGATIVE NEGATIVE Final    Comment: (NOTE) The Xpert Xpress SARS-CoV-2/FLU/RSV plus assay is intended as an aid in the diagnosis of influenza from Nasopharyngeal swab specimens and should not be used as a sole basis for treatment. Nasal washings and aspirates are unacceptable for Xpert Xpress  SARS-CoV-2/FLU/RSV testing.  Fact Sheet for Patients: https://www.fda.gov/media/152166/download  Fact Sheet for Healthcare Providers: https://www.fda.gov/media/152162/download  This test is not yet approved or cleared by the [place] FDA and has been authorized for detection and/or diagnosis of SARS-CoV-2 by FDA under an Emergency Use Authorization (EUA). This EUA will remain in effect (meaning this test can be used) for the duration of the COVID-19 declaration under Section 564(b)(1) of the Act, 21 U.S.C. section 360bbb-3(b)(1), unless the authorization is terminated or revoked.  Performed at [facility], [address] [place], [place] [address]   Blood culture (routine x 2)     Status: None (Preliminary result)   Collection Time: 05/08/21 10:05 PM   Specimen: BLOOD  Result Value Ref Range Status   Specimen Description BLOOD LEFT FOREARM  Final   Special Requests   Final    BOTTLES DRAWN AEROBIC AND ANAEROBIC Blood Culture results may not be optimal due to an inadequate volume of blood received in culture bottles   Culture   Final    NO GROWTH 3 DAYS Performed at [facility], [address] [place], [place] [address]    Report Status PENDING  Incomplete  Blood culture (routine x 2)     Status: None (Preliminary result)   Collection Time: 05/08/21 10:05 PM   Specimen: BLOOD  Result Value Ref Range Status   Specimen Description BLOOD LEFT FOREARM  Final   Special Requests   Final    BOTTLES DRAWN AEROBIC AND ANAEROBIC Blood Culture results may not be optimal due to an inadequate volume of blood received in culture bottles   Culture   Final    NO GROWTH 3 DAYS Performed at [facility], [address] [place], [place] [address]    Report Status PENDING  Incomplete  Body fluid culture w Gram Stain     Status: None (Preliminary result)   Collection Time: 05/09/21  9:40 PM   Specimen: Peritoneal Dialysate; Body Fluid  Result Value Ref  Range Status   Specimen Description   Final    PERITONEAL DIALYSATE Performed at [facility], [address] [place], [place] [address]    Special Requests   Final    NONE Performed at [facility], [address] [place], [place] [address]    Gram Stain   Final    MANY WBC SEEN NO ORGANISMS SEEN NO RBC SEEN Performed at [facility], [address] [place], [place] [address]    Culture   Final    NO GROWTH 1 DAY Performed at [facility], [address] [place], [place] [address]    Report Status PENDING  Incomplete    IMAGING: CT ABDOMEN PELVIS WO CONTRAST  Result Date: 05/08/2021 CLINICAL DATA:  Dialysis nausea vomiting diarrhea EXAM: CT ABDOMEN AND PELVIS WITHOUT CONTRAST TECHNIQUE: Multidetector CT imaging of the abdomen and pelvis was performed following the standard protocol without IV contrast. COMPARISON:  CT 04/11/2021 FINDINGS: Lower chest: Lung bases   demonstrate no acute consolidation or effusion. Coronary vascular calcification. Trace pericardial effusion. Hepatobiliary: No calcified gallstone. No focal hepatic abnormality. No biliary dilatation. Pancreas: Unremarkable. No pancreatic ductal dilatation or surrounding inflammatory changes. Spleen: Normal in size without focal abnormality. Adrenals/Urinary Tract: Adrenal glands are within normal limits. Atrophic kidneys without hydronephrosis. Stable exophytic cyst upper pole left kidney. Small stones and intrarenal vascular calcification on the right. Mild dilatation of left renal collecting system with possible hyperdense material in the left renal pelvis. No obstructing stone. Thick-walled urinary bladder with stranding. Stomach/Bowel: Stomach nonenlarged. No dilated small bowel. No acute bowel wall thickening. Partially visualized negative appendix. Vascular/Lymphatic: Advanced aortic atherosclerosis. No aneurysm. No suspicious nodes Reproductive: Coarse prostate calcifications.  Calcified seminal vesicles. Other: Small amount of free fluid surrounding the liver and spleen and also within the right lower quadrant. Small amount of free air in the upper abdomen. Peritoneal dialysis catheter coiled in the right lower quadrant. Musculoskeletal: No acute or suspicious osseous abnormality. IMPRESSION: 1. Negative for bowel obstruction or bowel inflammatory process. 2. Peritoneal dialysis catheter coiled in the right lower quadrant. There is small amount of free fluid in the upper abdomen as well as the right lower quadrant in addition to small amount of pneumoperitoneum which is felt related to peritoneal dialysis catheter. These findings have been present on the comparison studies. 3. Atrophic native kidneys. Small stones in the right kidney. Slightly dilated left renal collecting system with possible hyperdense material or tissue within the left renal collecting system, nonemergent follow-up contrasted exam may be considered. 4. Slightly thick-walled urinary bladder with mild stranding/perivesical inflammatory change Electronically Signed   By: Kim  Fujinaga M.D.   On: 05/08/2021 20:33    Assessment:   Rodney Gordon is a 64 y.o. male  with end-stage renal disease on peritoneal dialysis for the last several years who has been having abdominal pain and evidence of methicillin resistant  staph epidermidis peritoneal abscess catheter infection since he was seen September 21 at [facility].  He has been intermittently on vancomycin though the dosing is not change confirmed that he has been taking at all. On admission his white count was elevated but less than it had been in September.  Follow-up adrenal fluid has been sent for culture.    Recommendations Given the low virulence organism there is no immediate need to remove the peritoneal catheter. Continue PD vanco. He can be dced based on pharm and renal recs likely after reaches appropriate trough levels. Likely Sunday or Monday. Renal will give  final recs Thank you very much for allowing me to participate in the care of this patient. Please call with questions.   David P. Fitzgerald, MD   

## 2021-05-11 NOTE — Care Management Important Message (Signed)
Important Message  Patient Details  Name: Rodney Gordon MRN: [id number] Date of Birth: 02-Nov-1955   Medicare Important Message Given:  Yes     Dannette Barbara 05/11/2021, 1:53 PM

## 2021-05-11 NOTE — Plan of Care (Signed)

## 2021-05-12 DIAGNOSIS — R1030 Lower abdominal pain, unspecified: Secondary | ICD-10-CM | POA: Diagnosis not present

## 2021-05-12 DIAGNOSIS — K659 Peritonitis, unspecified: Secondary | ICD-10-CM | POA: Diagnosis not present

## 2021-05-12 DIAGNOSIS — A419 Sepsis, unspecified organism: Secondary | ICD-10-CM | POA: Diagnosis not present

## 2021-05-12 LAB — BASIC METABOLIC PANEL
Anion gap: 9 (ref 5–15)
BUN: 48 mg/dL — ABNORMAL HIGH (ref 8–23)
CO2: 30 mmol/L (ref 22–32)
Calcium: 7.9 mg/dL — ABNORMAL LOW (ref 8.9–10.3)
Chloride: 91 mmol/L — ABNORMAL LOW (ref 98–111)
Creatinine, Ser: 11.57 mg/dL — ABNORMAL HIGH (ref 0.61–1.24)
GFR, Estimated: 4 mL/min — ABNORMAL LOW (ref >60)
Glucose, Bld: 100 mg/dL — ABNORMAL HIGH (ref 70–99)
Potassium: 3.9 mmol/L (ref 3.5–5.1)
Sodium: 130 mmol/L — ABNORMAL LOW (ref 135–145)

## 2021-05-12 LAB — MAGNESIUM: Magnesium: 1.9 mg/dL (ref 1.7–2.4)

## 2021-05-12 MED ORDER — DILTIAZEM HCL ER COATED BEADS 120 MG PO CP24
240.0000 mg | ORAL_CAPSULE | Freq: Every day | ORAL | Status: DC
  Administered 2021-05-13 – 2021-05-14 (×2): 240 mg via ORAL
  Filled 2021-05-12 (×2): qty 2

## 2021-05-12 NOTE — Progress Notes (Signed)
[facility]  PROGRESS NOTE   Subjective:   Feels much better. On CCPD. Has been on vancomycin intravenously.  Objective:  Vital signs in last 24 hours:  Temp:  [97.9 F (36.6 C)-99.6 F (37.6 C)] 98.1 F (36.7 C) (10/22 1130) Pulse Rate:  [77-108] 108 (10/22 1130) Resp:  [16-18] 16 (10/22 1130) BP: (114-144)/(65-88) 118/65 (10/22 1130) SpO2:  [97 %-100 %] 100 % (10/22 1130) Weight:  [77.1 kg] 77.1 kg (10/22 0406)  Weight change: -3.4 kg Filed Weights   05/10/21 1953 05/11/21 0358 05/12/21 0406  Weight: 80.5 kg 77.9 kg 77.1 kg    Intake/Output: I/O last 3 completed shifts: In: 12878 [P.O.:240; Other:12000] Out: 67672 [Urine:400; CNOBS:96283]   Intake/Output this shift:  Total I/O In: 480 [P.O.:480] Out: -   Physical Exam: General:  No acute distress  Head:  Normocephalic, atraumatic. Moist oral mucosal membranes  Eyes:  Anicteric  Neck:  Supple  Lungs:   Clear to auscultation, normal effort  Heart:  S1S2 no rubs  Abdomen:   Soft, nontender, bowel sounds present  Extremities:  peripheral edema.  Neurologic:  Awake, alert, following commands  Skin:  No lesions  Access:     Basic Metabolic Panel: Recent Labs  Lab 05/08/21 1616 05/08/21 1618 05/08/21 1618 05/10/21 0539 05/11/21 0548 05/12/21 0419  NA  --  128*  --  129* 132* 130*  K  --  4.9  --  3.5 4.0 3.9  CL  --  90*  --  93* 92* 91*  CO2  --  27  --  26 30 30   GLUCOSE  --  115*  --  113* 110* 100*  BUN  --  53*  --  51* 48* 48*  CREATININE  --  12.79*  --  12.78* 12.13* 11.57*  CALCIUM  --  8.9   < > 8.1* 8.1* 7.9*  MG 1.5*  --   --  1.5*  --  1.9  PHOS  --   --   --  3.7  --   --    < > = values in this interval not displayed.    CBC: Recent Labs  Lab 05/08/21 1618 05/09/21 0748 05/10/21 0539  WBC 23.8* 18.5* 13.0*  HGB 8.2* 6.9* 8.5*  HCT 24.4* 21.2* 25.5*  MCV 101.2* 100.0 95.5  PLT 583* 497* 403*     Urinalysis: No results for input(s): COLORURINE, LABSPEC,  PHURINE, GLUCOSEU, HGBUR, BILIRUBINUR, KETONESUR, PROTEINUR, UROBILINOGEN, NITRITE, LEUKOCYTESUR in the last 72 hours.  Invalid input(s): APPERANCEUR    Imaging: No results found.   Medications:    dialysis solution 1.5% low-MG/low-CA      apixaban  5 mg Oral BID   atenolol  50 mg Oral Daily   And   chlorthalidone  25 mg Oral Daily   [START ON 05/13/2021] diltiazem  240 mg Oral Daily   furosemide  80 mg Oral BID   gentamicin cream  1 application Topical Daily   lactulose  20 g Oral Once   multivitamin  1 tablet Oral Daily   pantoprazole  40 mg Oral QHS   saccharomyces boulardii  250 mg Oral BID   sodium bicarbonate  1,300 mg Oral TID   sodium chloride flush  3 mL Intravenous Q12H   tamsulosin  0.4 mg Oral Daily   vancomycin variable dose per unstable renal function (pharmacist dosing)   Does not apply See admin instructions    Assessment/ Plan:     Principal Problem:  Abdominal pain Active Problems:   Essential hypertension, benign   ESRD (end stage renal disease) ([facility])   Fever and chills   Peritonitis ([facility])   Atrial fibrillation with RVR ([facility])   Anemia   Sepsis ([place])  65 year old male with history of hypertension, coronary artery disease, congestive heart failure, peripheral vascular disease, end-stage renal disease on peritoneal dialysis with anemia now admitted with history of abdominal pain.  He is found to have peritonitis and is being treated with intravenous vancomycin.  #1: End-stage renal disease: Patient is on CCPD and has been tolerating well.  We will continue same regimen.  #2: Acute peritonitis/MRSA sepsis.  Patient has MRSA.  Renal abscess.  He is on vancomycin intravenously for trough is higher at this time.  #3: Metabolic acidosis: We will continue sodium bicarbonate.  #4: Fluid overload: We will continue the diuretics as ordered.  #5: Anemia of chronic kidney disease: We will continue the Epogen.  #6: Secondary hyperparathyroidism: Labs  reviewed.  #7: Hypertension: Blood pressure is better controlled at this time.   LOS: [place], MD [facility] 10/22/20222:08 PM

## 2021-05-12 NOTE — Progress Notes (Signed)
PROGRESS NOTE    Rodney Gordon  OZH:[id number] DOB: [birth year] DOA: 05/08/2021 PCP: Neale Burly, MD   Chief complaint.  Abdominal pain. Brief Narrative:  This 65 years old male with PMH significant for aortic valve insufficiency, CAD, nonobstructive, ESRD on PD, hypertension, peripheral vascular disease, A. fib presented in the ED with complaint of abdominal pain.  He is currently being treated for peritonitis by his nephrologist and is still having abdominal pain and not feeling well.  Patient also reports fever and chills and vomiting he went to [facility] on 9/21 and left AMA as he did not have staff with his skills for peritoneal dialysis.  Patient has been having abdominal pain infection since last month, patient has multiple visits to the ED and left AGAINST MEDICAL ADVICE. Patient has been noncompliant with Eliquis but has been taking Cardizem for heart rate control only. Patient is admitted for peritonitis possibly due to peritoneal dialysis catheter infection.  Patient has received vancomycin, Nephrology and ID is consulted.   Assessment & Plan:   Principal Problem:   Abdominal pain Active Problems:   Essential hypertension, benign   ESRD (end stage renal disease) (HCC)   Fever and chills   Peritonitis (HCC)   Atrial fibrillation with RVR (HCC)   Anemia   Sepsis (HCC)  Bacterial peritonitis secondary to methicillin-resistant staph epidermis. Sepsis secondary to bacterial peritonitis. patient had a significant tachycardia and severe leukocytosis at the time admission.  He had bacteria peritonitis.  He met sepsis criteria. Repeat culture from peritoneal fluid had no growth. Continue IV vancomycin and intra-abdominal vancomycin. Plan is to discontinue IV vancomycin tomorrow, and continue PD vancomycin at discharge.  Persistent Atrial fibrillation with RVR. Continue Eliquis, heart rate went up last night, blood pressure stable, will increase diltiazem dose, continue  atenolol.  End-stage renal disease on peritoneal dialysis. Anemia of chronic kidney disease. Hyponatremia Hypomagnesemia Followed by nephrology for peritoneal dialysis.   DVT prophylaxis: Eliquis Code Status: full Family Communication:  Disposition Plan:      Status is: Inpatient   Remains inpatient appropriate because: Due to severity of disease, currently receiving IV vancomycin.     I/O last 3 completed shifts: In: 62952 [P.O.:240; Other:12000] Out: 84132 [Urine:400; Other:14460] Total I/O In: 240 [P.O.:240] Out: -    Consultants:  ID, nephrology.   Procedures: PD   Antimicrobials: Vancomycin.    Subjective: Spoke to nurse, patient had episode of tachycardia earlier this morning, heart rate is better now. Patient doing well, abdominal pain essentially resolved.  No nausea vomiting.  No constipation or diarrhea. No fever chills pain No dysuria hematuria.  Objective: Vitals:   05/12/21 0016 05/12/21 0406 05/12/21 0813 05/12/21 1130  BP: 114/73 124/75 (!) 144/83 118/65  Pulse: 81 77 91 (!) 108  Resp: '18 18 16 16  ' Temp: 97.9 F (36.6 C) 98 F (36.7 C) 98.2 F (36.8 C) 98.1 F (36.7 C)  TempSrc:      SpO2: 99% 100% 100% 100%  Weight:  77.1 kg    Height:        Intake/Output Summary (Last 24 hours) at 05/12/2021 1254 Last data filed at 05/12/2021 1000 Gross per 24 hour  Intake 240 ml  Output 200 ml  Net 40 ml   Filed Weights   05/10/21 1953 05/11/21 0358 05/12/21 0406  Weight: 80.5 kg 77.9 kg 77.1 kg    Examination:  General exam: Appears calm and comfortable  Respiratory system: Clear to auscultation. Respiratory effort normal. Cardiovascular system:  Irregularly irregular.  No JVD, murmurs, rubs, gallops or clicks. No pedal edema. Gastrointestinal system: Abdomen is nondistended, soft and nontender. No organomegaly or masses felt. Normal bowel sounds heard. Central nervous system: Alert and oriented. No focal neurological  deficits. Extremities: Symmetric 5 x 5 power. Skin: No rashes, lesions or ulcers Psychiatry: Judgement and insight appear normal. Mood & affect appropriate.     Data Reviewed: I have personally reviewed following labs and imaging studies  CBC: Recent Labs  Lab 05/08/21 1618 05/09/21 0748 05/10/21 0539  WBC 23.8* 18.5* 13.0*  HGB 8.2* 6.9* 8.5*  HCT 24.4* 21.2* 25.5*  MCV 101.2* 100.0 95.5  PLT 583* 497* 638*   Basic Metabolic Panel: Recent Labs  Lab 05/08/21 1616 05/08/21 1618 05/10/21 0539 05/11/21 0548 05/12/21 0419  NA  --  128* 129* 132* 130*  K  --  4.9 3.5 4.0 3.9  CL  --  90* 93* 92* 91*  CO2  --  '27 26 30 30  ' GLUCOSE  --  115* 113* 110* 100*  BUN  --  53* 51* 48* 48*  CREATININE  --  12.79* 12.78* 12.13* 11.57*  CALCIUM  --  8.9 8.1* 8.1* 7.9*  MG 1.5*  --  1.5*  --  1.9  PHOS  --   --  3.7  --   --    GFR: Estimated Creatinine Clearance: 7 mL/min (A) (by C-G formula based on SCr of 11.57 mg/dL (H)). Liver Function Tests: Recent Labs  Lab 05/08/21 1618  AST 22  ALT 6  ALKPHOS 159*  BILITOT 3.3*  PROT 7.1  ALBUMIN 2.2*   Recent Labs  Lab 05/08/21 1618  LIPASE 22   No results for input(s): AMMONIA in the last 168 hours. Coagulation Profile: No results for input(s): INR, PROTIME in the last 168 hours. Cardiac Enzymes: No results for input(s): CKTOTAL, CKMB, CKMBINDEX, TROPONINI in the last 168 hours. BNP (last 3 results) No results for input(s): PROBNP in the last 8760 hours. HbA1C: No results for input(s): HGBA1C in the last 72 hours. CBG: No results for input(s): GLUCAP in the last 168 hours. Lipid Profile: No results for input(s): CHOL, HDL, LDLCALC, TRIG, CHOLHDL, LDLDIRECT in the last 72 hours. Thyroid Function Tests: No results for input(s): TSH, T4TOTAL, FREET4, T3FREE, THYROIDAB in the last 72 hours. Anemia Panel: No results for input(s): VITAMINB12, FOLATE, FERRITIN, TIBC, IRON, RETICCTPCT in the last 72 hours. Sepsis Labs: No  results for input(s): PROCALCITON, LATICACIDVEN in the last 168 hours.  Recent Results (from the past 240 hour(s))  Resp Panel by RT-PCR (Flu A&B, Covid) Nasopharyngeal Swab     Status: None   Collection Time: 05/08/21 10:05 PM   Specimen: Nasopharyngeal Swab; Nasopharyngeal(NP) swabs in vial transport medium  Result Value Ref Range Status   SARS Coronavirus 2 by RT PCR NEGATIVE NEGATIVE Final    Comment: (NOTE) SARS-CoV-2 target nucleic acids are NOT DETECTED.  The SARS-CoV-2 RNA is generally detectable in upper respiratory specimens during the acute phase of infection. The lowest concentration of SARS-CoV-2 viral copies this assay can detect is 138 copies/mL. A negative result does not preclude SARS-Cov-2 infection and should not be used as the sole basis for treatment or other patient management decisions. A negative result may occur with  improper specimen collection/handling, submission of specimen other than nasopharyngeal swab, presence of viral mutation(s) within the areas targeted by this assay, and inadequate number of viral copies(<138 copies/mL). A negative result must be combined with clinical observations,  patient history, and epidemiological information. The expected result is Negative.  Fact Sheet for Patients:  EntrepreneurPulse.com.au  Fact Sheet for Healthcare Providers:  IncredibleEmployment.be  This test is no t yet approved or cleared by the [place] FDA and  has been authorized for detection and/or diagnosis of SARS-CoV-2 by FDA under an Emergency Use Authorization (EUA). This EUA will remain  in effect (meaning this test can be used) for the duration of the COVID-19 declaration under Section 564(b)(1) of the Act, 21 U.S.C.section 360bbb-3(b)(1), unless the authorization is terminated  or revoked sooner.       Influenza A by PCR NEGATIVE NEGATIVE Final   Influenza B by PCR NEGATIVE NEGATIVE Final    Comment:  (NOTE) The Xpert Xpress SARS-CoV-2/FLU/RSV plus assay is intended as an aid in the diagnosis of influenza from Nasopharyngeal swab specimens and should not be used as a sole basis for treatment. Nasal washings and aspirates are unacceptable for Xpert Xpress SARS-CoV-2/FLU/RSV testing.  Fact Sheet for Patients: EntrepreneurPulse.com.au  Fact Sheet for Healthcare Providers: IncredibleEmployment.be  This test is not yet approved or cleared by the [place] FDA and has been authorized for detection and/or diagnosis of SARS-CoV-2 by FDA under an Emergency Use Authorization (EUA). This EUA will remain in effect (meaning this test can be used) for the duration of the COVID-19 declaration under Section 564(b)(1) of the Act, 21 U.S.C. section 360bbb-3(b)(1), unless the authorization is terminated or revoked.  Performed at [facility], [address] [place], [place] [address]   Blood culture (routine x 2)     Status: None (Preliminary result)   Collection Time: 05/08/21 10:05 PM   Specimen: BLOOD  Result Value Ref Range Status   Specimen Description BLOOD LEFT FOREARM  Final   Special Requests   Final    BOTTLES DRAWN AEROBIC AND ANAEROBIC Blood Culture results may not be optimal due to an inadequate volume of blood received in culture bottles   Culture   Final    NO GROWTH 3 DAYS Performed at [facility], [address] [place], [place] [address]    Report Status PENDING  Incomplete  Blood culture (routine x 2)     Status: None (Preliminary result)   Collection Time: 05/08/21 10:05 PM   Specimen: BLOOD  Result Value Ref Range Status   Specimen Description BLOOD LEFT FOREARM  Final   Special Requests   Final    BOTTLES DRAWN AEROBIC AND ANAEROBIC Blood Culture results may not be optimal due to an inadequate volume of blood received in culture bottles   Culture   Final    NO GROWTH 3 DAYS Performed at [facility], [address], [place], [place] [address]    Report Status PENDING  Incomplete  Body fluid culture w Gram Stain     Status: None (Preliminary result)   Collection Time: 05/09/21  9:40 PM   Specimen: Peritoneal Dialysate; Body Fluid  Result Value Ref Range Status   Specimen Description   Final    PERITONEAL DIALYSATE Performed at [facility] [address], [place], [place] [address]    Special Requests   Final    NONE Performed at [facility], [address] [place], [place] [address]    Gram Stain   Final    MANY WBC SEEN NO ORGANISMS SEEN NO RBC SEEN Performed at [facility], [address] [place],  [address]    Culture   Final  NO GROWTH 2 DAYS Performed at [facility], [place] [address] [place], [place] [address]    Report Status PENDING  Incomplete         Radiology Studies: No results found.      Scheduled Meds:  apixaban  5 mg Oral BID   atenolol  50 mg Oral Daily   And   chlorthalidone  25 mg Oral Daily   [START ON 05/13/2021] diltiazem  240 mg Oral Daily   furosemide  80 mg Oral BID   gentamicin cream  1 application Topical Daily   lactulose  20 g Oral Once   multivitamin  1 tablet Oral Daily   pantoprazole  40 mg Oral QHS   saccharomyces boulardii  250 mg Oral BID   sodium bicarbonate  1,300 mg Oral TID   sodium chloride flush  3 mL Intravenous Q12H   tamsulosin  0.4 mg Oral Daily   vancomycin variable dose per unstable renal function (pharmacist dosing)   Does not apply See admin instructions   Continuous Infusions:  dialysis solution 1.5% low-MG/low-CA       LOS: 4 days    Time spent: 28 minutes    Sharen Hones, MD [facility]   To contact the attending provider between 7A-7P or the covering provider during after hours 7P-7A, please log into the web site [email or web address] and access using universal [place] password for that web site. If you do not have the password,  please call the hospital operator.  05/12/2021, 12:54 PM

## 2021-05-13 DIAGNOSIS — K659 Peritonitis, unspecified: Secondary | ICD-10-CM | POA: Diagnosis not present

## 2021-05-13 DIAGNOSIS — N186 End stage renal disease: Secondary | ICD-10-CM | POA: Diagnosis not present

## 2021-05-13 DIAGNOSIS — A419 Sepsis, unspecified organism: Secondary | ICD-10-CM | POA: Diagnosis not present

## 2021-05-13 DIAGNOSIS — R1030 Lower abdominal pain, unspecified: Secondary | ICD-10-CM | POA: Diagnosis not present

## 2021-05-13 LAB — BODY FLUID CULTURE W GRAM STAIN: Culture: NO GROWTH

## 2021-05-13 LAB — CULTURE, BLOOD (ROUTINE X 2)
Culture: NO GROWTH
Culture: NO GROWTH

## 2021-05-13 LAB — VANCOMYCIN, RANDOM: Vancomycin Rm: 21

## 2021-05-13 MED ORDER — VANCOMYCIN 100 MG/ML FOR DIALYSIS
Freq: Once | Status: AC
  Filled 2021-05-13: qty 3000

## 2021-05-13 MED ORDER — VANCOMYCIN 100 MG/ML FOR DIALYSIS
Freq: Once | Status: DC
  Filled 2021-05-13: qty 3000

## 2021-05-13 MED ORDER — VANCOMYCIN 100 MG/ML FOR DIALYSIS: Freq: Once | Status: DC

## 2021-05-13 MED ORDER — DILTIAZEM HCL ER COATED BEADS 240 MG PO CP24: 240.0000 mg | ORAL_CAPSULE | Freq: Every day | ORAL | 0 refills | Status: DC

## 2021-05-13 MED ORDER — APIXABAN 5 MG PO TABS: 5.0000 mg | ORAL_TABLET | Freq: Two times a day (BID) | ORAL | 0 refills | Status: DC

## 2021-05-13 MED ORDER — DIANEAL LOW CALCIUM/1.5% DEX 344 MOSM/L IP SOLN: 3000.0000 mL | INTRAPERITONEAL | Status: AC

## 2021-05-13 NOTE — Progress Notes (Signed)
[facility]  PROGRESS NOTE   Subjective:   Feels much better. On CCPD. Has been on vancomycin intravenously. Now scheduled for discharge to follow up as out patient  Objective:  Vital signs in last 24 hours:  Temp:  [97.9 F (36.6 C)-98.5 F (36.9 C)] 98.5 F (36.9 C) (10/23 1150) Pulse Rate:  [84-101] 90 (10/23 1150) Resp:  [16-18] 16 (10/23 1150) BP: (112-137)/(62-80) 133/77 (10/23 1150) SpO2:  [98 %-100 %] 100 % (10/23 1150) Weight:  [76.8 kg-76.9 kg] 76.9 kg (10/23 0511)  Weight change: -0.3 kg Filed Weights   05/12/21 0406 05/12/21 2158 05/13/21 0511  Weight: 77.1 kg 76.8 kg 76.9 kg    Intake/Output: I/O last 3 completed shifts: In: 97673 [P.O.:480; I.V.:3; Other:12000] Out: -    Intake/Output this shift:  Total I/O In: 1200 [Other:1200] Out: 12190 [Other:12190]  Physical Exam: General:  No acute distress  Head:  Normocephalic, atraumatic. Moist oral mucosal membranes  Eyes:  Anicteric  Neck:  Supple  Lungs:   Clear to auscultation, normal effort  Heart:  S1S2 no rubs  Abdomen:   Soft, nontender, bowel sounds present  Extremities:  peripheral edema.  Neurologic:  Awake, alert, following commands  Skin:  No lesions  Access:     Basic Metabolic Panel: Recent Labs  Lab 05/08/21 1616 05/08/21 1618 05/08/21 1618 05/10/21 0539 05/11/21 0548 05/12/21 0419  NA  --  128*  --  129* 132* 130*  K  --  4.9  --  3.5 4.0 3.9  CL  --  90*  --  93* 92* 91*  CO2  --  27  --  26 30 30   GLUCOSE  --  115*  --  113* 110* 100*  BUN  --  53*  --  51* 48* 48*  CREATININE  --  12.79*  --  12.78* 12.13* 11.57*  CALCIUM  --  8.9   < > 8.1* 8.1* 7.9*  MG 1.5*  --   --  1.5*  --  1.9  PHOS  --   --   --  3.7  --   --    < > = values in this interval not displayed.    CBC: Recent Labs  Lab 05/08/21 1618 05/09/21 0748 05/10/21 0539  WBC 23.8* 18.5* 13.0*  HGB 8.2* 6.9* 8.5*  HCT 24.4* 21.2* 25.5*  MCV 101.2* 100.0 95.5  PLT 583* 497* 403*      Urinalysis: No results for input(s): COLORURINE, LABSPEC, PHURINE, GLUCOSEU, HGBUR, BILIRUBINUR, KETONESUR, PROTEINUR, UROBILINOGEN, NITRITE, LEUKOCYTESUR in the last 72 hours.  Invalid input(s): APPERANCEUR    Imaging: No results found.   Medications:    dialysis solution 1.5% low-MG/low-CA     dianeal solution for CAPD/CCPD with additives      apixaban  5 mg Oral BID   atenolol  50 mg Oral Daily   And   chlorthalidone  25 mg Oral Daily   diltiazem  240 mg Oral Daily   furosemide  80 mg Oral BID   gentamicin cream  1 application Topical Daily   lactulose  20 g Oral Once   multivitamin  1 tablet Oral Daily   pantoprazole  40 mg Oral QHS   saccharomyces boulardii  250 mg Oral BID   sodium bicarbonate  1,300 mg Oral TID   sodium chloride flush  3 mL Intravenous Q12H   tamsulosin  0.4 mg Oral Daily   vancomycin variable dose per unstable renal function (pharmacist dosing)  Does not apply See admin instructions    Assessment/ Plan:     Principal Problem:   Abdominal pain Active Problems:   Essential hypertension, benign   ESRD (end stage renal disease) (HCC)   Fever and chills   Peritonitis (HCC)   Atrial fibrillation with RVR (HCC)   Anemia   Sepsis ([place])  65 year old male with history of hypertension, coronary artery disease, congestive heart failure, peripheral vascular disease, end-stage renal disease on peritoneal dialysis with anemia now admitted with history of abdominal pain.  He is found to have peritonitis and is being treated with intravenous vancomycin.   #1: End-stage renal disease: Patient is on CCPD and has been tolerating well.  We will continue same regimen. Stable for discharge today and advised to follow up with Dr. Holley Raring   #2: Acute peritonitis/MRSA sepsis.  Patient has MRSA.  Peritoneal abscess.  will need vancomycin via PD catheter. Patient to follow up with Dr. Holley Raring.   #3: Metabolic acidosis: We will continue sodium bicarbonate.    #4: Fluid overload: We will continue the diuretics as ordered.   #5: Anemia of chronic kidney disease: We will continue the Epogen.   #6: Secondary hyperparathyroidism: Labs reviewed.   #7: Hypertension: Blood pressure is better controlled at this time.   LOS: [place], MD [facility] 10/23/202211:55 AM

## 2021-05-13 NOTE — Discharge Summary (Signed)
Physician Discharge Summary  Patient ID: Rodney Gordon MRN: [id number] DOB/AGE: 65/12/1955 65 y.o.  Admit date: 05/08/2021 Discharge date: 05/13/2021  Admission Diagnoses:  Discharge Diagnoses:  Principal Problem:   Abdominal pain Active Problems:   Essential hypertension, benign   ESRD (end stage renal disease) (HCC)   Fever and chills   Peritonitis (HCC)   Atrial fibrillation with RVR ([place])   Anemia   Sepsis ([place])   Discharged Condition: good  Hospital Course:  This 65 years old male with PMH significant for aortic valve insufficiency, CAD, nonobstructive, ESRD on PD, hypertension, peripheral vascular disease, A. fib presented in the ED with complaint of abdominal pain.  He is currently being treated for peritonitis by his nephrologist and is still having abdominal pain and not feeling well.  Patient also reports fever and chills and vomiting he went to [facility] on 9/21 and left AMA as he did not have staff with his skills for peritoneal dialysis.  Patient has been having abdominal pain infection since last month, patient has multiple visits to the ED and left AGAINST MEDICAL ADVICE. Patient has been noncompliant with Eliquis but has been taking Cardizem for heart rate control only. Patient is admitted for peritonitis possibly due to peritoneal dialysis catheter infection.  Patient has received vancomycin, Nephrology and ID is consulted.   Bacterial peritonitis secondary to methicillin-resistant staph epidermis. Sepsis secondary to bacterial peritonitis. patient had a significant tachycardia and severe leukocytosis at the time admission.  He had bacteria peritonitis.  He met sepsis criteria. Repeat culture from peritoneal fluid had no growth.  Based on recent culture, patient was a placed on IV vancomycin and intra-abdominal vancomycin. Discussed with ID on Friday, the plan was continue IV vancomycin and 2 today.  Then continue PD vancomycin for additional 10 days. I  discussed with the pharmacy, RN, also ask TOC to arrange for infusion.  Nephrology also notified.   Persistent Atrial fibrillation with RVR. Continue Eliquis, heart rate went up, blood pressure stable, increased diltiazem dose, continue atenolol.   End-stage renal disease on peritoneal dialysis. Anemia of chronic kidney disease. Hyponatremia Hypomagnesemia Followed by nephrology for peritoneal dialysis.     Consults: nephrology, ID  Significant Diagnostic Studies:   Treatments: Vancomycin  Discharge Exam: Blood pressure 124/73, pulse 84, temperature 97.9 F (36.6 C), temperature source Oral, resp. rate 18, height 6' (1.829 m), weight 76.9 kg, SpO2 98 %. General appearance: alert and cooperative Resp: clear to auscultation bilaterally Cardio: regular rate and rhythm, S1, S2 normal, no murmur, click, rub or gallop GI: soft, non-tender; bowel sounds normal; no masses,  no organomegaly Extremities: extremities normal, atraumatic, no cyanosis or edema  Disposition: Discharge disposition: 01-Home or Self Care       Discharge Instructions     Diet general   Complete by: As directed    Renal diet   Discharge wound care:   Complete by: As directed    Follow with visiting RN.   Increase activity slowly   Complete by: As directed       Allergies as of 05/13/2021   No Known Allergies      Medication List     STOP taking these medications    calcium acetate 667 MG capsule Commonly known as: PHOSLO   diphenhydramine-acetaminophen 25-500 MG Tabs tablet Commonly known as: TYLENOL PM   doxazosin 8 MG tablet Commonly known as: CARDURA   isosorbide mononitrate 30 MG 24 hr tablet Commonly known as: IMDUR   losartan 50 MG  tablet Commonly known as: COZAAR   minoxidil 2.5 MG tablet Commonly known as: LONITEN   multivitamin Tabs tablet   potassium chloride SA 20 MEQ tablet Commonly known as: KLOR-CON   triamcinolone cream 0.1 % Commonly known as: KENALOG        TAKE these medications    acetaminophen 650 MG CR tablet Commonly known as: TYLENOL Take 650 mg by mouth daily as needed for pain. What changed: Another medication with the same name was removed. Continue taking this medication, and follow the directions you see here.   apixaban 5 MG Tabs tablet Commonly known as: Eliquis Take 1 tablet (5 mg total) by mouth 2 (two) times daily.   atenolol-chlorthalidone 50-25 MG tablet Commonly known as: TENORETIC Take 1 tablet by mouth daily.   dianeal lo-cal 1.5% 344 MOSM/L Soln 3,000 mLs by Dialysis route continuous for 10 days. Add 750 mg vancomycin to solution on days that vancomycin level is less than 22 mcg/mL  check random vancomycin level in 2-3 days after previous dose was administered   diltiazem 240 MG 24 hr capsule Commonly known as: CARDIZEM CD Take 1 capsule (240 mg total) by mouth daily. Start taking on: May 14, 2021 What changed:  medication strength how much to take   diltiazem 60 MG tablet Commonly known as: CARDIZEM Take 60 mg by mouth 4 (four) times daily as needed (for rapid hr).   furosemide 80 MG tablet Commonly known as: LASIX Take 80 mg by mouth 2 (two) times daily.   lidocaine-prilocaine cream Commonly known as: EMLA Apply 1 application topically as needed (prior to dialysis).   methocarbamol 500 MG tablet Commonly known as: ROBAXIN Take 500 mg by mouth at bedtime.   nitroGLYCERIN 0.4 MG SL tablet Commonly known as: NITROSTAT Place 1 tablet (0.4 mg total) under the tongue as directed. What changed:  when to take this reasons to take this   oxyCODONE-acetaminophen 5-325 MG tablet Commonly known as: PERCOCET/ROXICET Take 1 tablet by mouth every 6 (six) hours as needed for severe pain.   sildenafil 50 MG tablet Commonly known as: VIAGRA Take 50 mg by mouth daily as needed.   sodium bicarbonate 650 MG tablet Take 1,300 mg by mouth 3 (three) times daily.   tamsulosin 0.4 MG Caps  capsule Commonly known as: FLOMAX Take 1 capsule by mouth daily.               Discharge Care Instructions  (From admission, onward)           Start     Ordered   05/13/21 0000  Discharge wound care:       Comments: Follow with visiting RN.   05/13/21 1104            Follow-up Information     Hasanaj, Samul Dada, MD Follow up in 1 week(s).   Specialty: Internal Medicine Contact information: [place] [address] [phone]         Satira Sark, MD .   Specialty: Cardiology Contact information: [place] [address] [phone]                34 minutes Signed: Sharen Hones 05/13/2021, 11:04 AM

## 2021-05-13 NOTE — Progress Notes (Signed)
Pharmacy Antibiotic Note  Rodney Gordon is a 65 y.o. male with ESRD on CCPD, admitted on 05/08/2021 with nausea, diarrhea, and abdominal pain determined to have peritonitis. Pharmacy has been consulted for Vancomycin dosing.  -On vancomycin via PD PTA. In the ED, given initial vancomycin IV 1500 mg x 1 (~19 mg/kg) on 10/18. Vanc random resulted 32 on 10/20, then downtrend to 29 on 10/21. Lvl likely elevated due PTA vancomycin.  Plan: Vancomycin random 10/23 @0436 = 21 mcg/ml .  -will now order vancomycin 750 mg to be put in peritoneal dialysis fluid (3L).  No further plans for IV vancomycin. Plan to continue with vancomycin intraperitoneal.  Monitor CCPD plans, UOP for residual renal function, and vancomycin random levels every 3-4 days. Pharmacy will continue to follow and assess daily and will ordered additional doses as warranted.    Height: 6' (182.9 cm) Weight: 76.9 kg (169 lb 8.5 oz) IBW/kg (Calculated) : 77.6  Temp (24hrs), Avg:98.2 F (36.8 C), Min:97.9 F (36.6 C), Max:98.5 F (36.9 C)  Recent Labs  Lab 05/08/21 1618 05/09/21 0748 05/10/21 0539 05/10/21 0916 05/11/21 0548 05/12/21 0419 05/13/21 0436  WBC 23.8* 18.5* 13.0*  --   --   --   --   CREATININE 12.79*  --  12.78*  --  12.13* 11.57*  --   VANCOTROUGH  --   --   --  32*  --   --   --   VANCORANDOM  --   --   --   --  29  --  21     Estimated Creatinine Clearance: 7 mL/min (A) (by C-G formula based on SCr of 11.57 mg/dL (H)).    No Known Allergies  Antimicrobials this admission: 10/18 Vancomycin Iv>> (x 1 dose on 10/18) 10/23- Vanc put into PD fluid  Microbiology results: 10/18 BCx: NG x 3 days 10/19 Peritoneal: Many WBC, no organisms, no RBC; NG x 1 day  Thank you for allowing pharmacy to be a part of this patient's care.  Telesha Deguzman A, PharmD 05/13/2021 2:26 PM

## 2021-05-13 NOTE — TOC Initial Note (Signed)
Transition of Care [facility]) - Initial/Assessment Note    Patient Details  Name: Rodney Gordon MRN: [id number] Date of Birth: 01-20-1956  Transition of Care [facility]) CM/SW Contact:    Rodney Masson, RN Phone Number:[phone] 05/13/2021, 1:32 PM  Clinical Narrative:                 Recommended IV infusion with HD an [place] for initiation of ongoing teaching: Spoke pt today concerning IV therapy, [facility]Health services and possible transportation source to get home today.  RN spoke with Rodney Gordon (IV therapy) concerning IV Vancomycin in with pt's dialysis. Pam indicates she would make a referral to [facility] for RN. Also requested pt remain inpt status and start teachings on the process for IV antibiotic therapy with his dialysis. States she would need MCR part B to approve this medication before confirming services. [facility]) verified they would be able to started services with the initial home visit on Tuesday. No transportation needs for today however may need assistance upon the confirmed discharge date. Team has been updated with the above information. Pt aware of the delay and will not be discharged today.   Pt confirms pt drives so he is able to get to all his medical appointments and afford all his medications. States he has a support system if needed but performs his dialysis independently. Pt has a rollator with a seat if needed for DME already in the home. No other needs presented at this time.  TOC will continue to follow up with discharge needs and confirm all services prior to discharge. Will address transportation needs at the time of discharge.  Expected Discharge Plan: [place] Barriers to Discharge: Continued Medical Work up   Patient Goals and CMS Choice        Expected Discharge Plan and Services Expected Discharge Plan: [place]   Discharge Planning Services: CM Consult Post Acute Care  Choice: [place] arrangements for the past 2 months: Apartment Expected Discharge Date: 05/13/21                         HH Arranged: RN, IV Antibiotics HH Agency: [place] ([place]) Date [place]: 05/13/21 Time [place]: 1330 Representative spoke with at [place]: Rodney Gordon spoke with Rodney Gordon for verifiication on [place] services  Prior Living Arrangements/Services Living arrangements for the past 2 months: Apartment Lives with:: Self Patient language and need for interpreter reviewed:: No Do you feel safe going back to the place where you live?: Yes      Need for Family Participation in Patient Care:  (Pt lives alone and self administers his pertineal diaylsis)   Current home services: Home RN    Activities of Daily Living      Permission Sought/Granted   Permission granted to share information with : Yes, Verbal Permission Granted              Emotional Assessment Appearance:: Appears older than stated age Attitude/Demeanor/Rapport: Engaged Affect (typically observed): Accepting Orientation: : Oriented to Self, Oriented to Place, Oriented to  Time, Oriented to Situation Alcohol / Substance Use: Not Applicable Psych Involvement: No (comment)  Admission diagnosis:  Peritonitis ([place]) [K65.9] Abdominal pain [R10.9] Patient Active Problem List   Diagnosis Date Noted   Sepsis ([place]) 05/11/2021   Abdominal pain 05/08/2021   Fever and chills 05/08/2021   Peritonitis (  [place]) 05/08/2021   Anemia 05/08/2021   Atrial fibrillation with RVR ([place]) 03/14/2021   ESRD (end stage renal disease) ([place]) 07/26/2013   Palpitations 04/15/2011   Aortic regurgitation 02/12/2010   Essential hypertension, benign 04/07/2009   VALVULAR HEART DISEASE 04/07/2009   PCP:  Neale Burly, MD Pharmacy:   [facility] [address] [place] [address] Phone: [phone] Fax:  [phone]     Social Determinants of Health (SDOH) Interventions    Readmission Risk Interventions No flowsheet data found.

## 2021-05-14 DIAGNOSIS — R1030 Lower abdominal pain, unspecified: Secondary | ICD-10-CM | POA: Diagnosis not present

## 2021-05-14 DIAGNOSIS — A419 Sepsis, unspecified organism: Secondary | ICD-10-CM | POA: Diagnosis not present

## 2021-05-14 DIAGNOSIS — N186 End stage renal disease: Secondary | ICD-10-CM | POA: Diagnosis not present

## 2021-05-14 DIAGNOSIS — K659 Peritonitis, unspecified: Secondary | ICD-10-CM | POA: Diagnosis not present

## 2021-05-14 NOTE — Discharge Summary (Signed)
Physician Discharge Summary  Patient ID: Rodney Gordon MRN: [id number] DOB/AGE: 09/13/1955 65 y.o.  Admit date: 05/08/2021 Discharge date: 05/14/2021  Admission Diagnoses:  Discharge Diagnoses:  Principal Problem:   Abdominal pain Active Problems:   Essential hypertension, benign   ESRD (end stage renal disease) (HCC)   Fever and chills   Peritonitis (HCC)   Atrial fibrillation with RVR ([place])   Anemia   Sepsis ([place])   Discharged Condition: good  Hospital Course:  This 65 years old male with PMH significant for aortic valve insufficiency, CAD, nonobstructive, ESRD on PD, hypertension, peripheral vascular disease, A. fib presented in the ED with complaint of abdominal pain.  He is currently being treated for peritonitis by his nephrologist and is still having abdominal pain and not feeling well.  Patient also reports fever and chills and vomiting he went to [facility] on 9/21 and left AMA as he did not have staff with his skills for peritoneal dialysis.  Patient has been having abdominal pain infection since last month, patient has multiple visits to the ED and left AGAINST MEDICAL ADVICE. Patient has been noncompliant with Eliquis but has been taking Cardizem for heart rate control only. Patient is admitted for peritonitis possibly due to peritoneal dialysis catheter infection.  Patient has received vancomycin, Nephrology and ID is consulted.     Bacterial peritonitis secondary to methicillin-resistant staph epidermis. Sepsis secondary to bacterial peritonitis. patient had a significant tachycardia and severe leukocytosis at the time admission.  He had bacteria peritonitis.  He met sepsis criteria. Repeat culture from peritoneal fluid had no growth.  Based on recent culture, patient was a placed on IV vancomycin and intra-abdominal vancomycin. Discussed with ID on Friday, the plan was continue IV vancomycin and 2 today.  Then continue PD vancomycin for additional 10 days. I  discussed with the pharmacy, RN, also ask TOC to arrange for infusion.  Nephrology also notified.   Persistent Atrial fibrillation with RVR. Continue Eliquis, heart rate went up, blood pressure stable, increased diltiazem dose, continue atenolol.   End-stage renal disease on peritoneal dialysis. Anemia of chronic kidney disease. Hyponatremia Hypomagnesemia Followed by nephrology for peritoneal dialysis.  Patient was not able to discharge yesterday, TOC could not set up vancomycin PD infusion.  There is no change in patient condition today, he will be discharged today after set up PD infusion.  Consults: ID and nephrology  Significant Diagnostic Studies:   Treatments: Vancomycin  Discharge Exam: Blood pressure (!) 144/75, pulse 91, temperature 98.5 F (36.9 C), temperature source Oral, resp. rate 20, height 6' (1.829 m), weight 77.3 kg, SpO2 100 %. General appearance: alert and cooperative Resp: clear to auscultation bilaterally Cardio: regular rate and rhythm, S1, S2 normal, no murmur, click, rub or gallop GI: soft, non-tender; bowel sounds normal; no masses,  no organomegaly Extremities: extremities normal, atraumatic, no cyanosis or edema  Disposition: Discharge disposition: 01-Home or Self Care       Discharge Instructions     Diet general   Complete by: As directed    Renal diet   Discharge wound care:   Complete by: As directed    Follow with visiting RN.   Increase activity slowly   Complete by: As directed       Allergies as of 05/14/2021   No Known Allergies      Medication List     STOP taking these medications    calcium acetate 667 MG capsule Commonly known as: PHOSLO   diphenhydramine-acetaminophen 25-500  MG Tabs tablet Commonly known as: TYLENOL PM   doxazosin 8 MG tablet Commonly known as: CARDURA   isosorbide mononitrate 30 MG 24 hr tablet Commonly known as: IMDUR   losartan 50 MG tablet Commonly known as: COZAAR   minoxidil 2.5  MG tablet Commonly known as: LONITEN   multivitamin Tabs tablet   potassium chloride SA 20 MEQ tablet Commonly known as: KLOR-CON   triamcinolone cream 0.1 % Commonly known as: KENALOG       TAKE these medications    acetaminophen 650 MG CR tablet Commonly known as: TYLENOL Take 650 mg by mouth daily as needed for pain. What changed: Another medication with the same name was removed. Continue taking this medication, and follow the directions you see here.   apixaban 5 MG Tabs tablet Commonly known as: Eliquis Take 1 tablet (5 mg total) by mouth 2 (two) times daily.   atenolol-chlorthalidone 50-25 MG tablet Commonly known as: TENORETIC Take 1 tablet by mouth daily.   dianeal lo-cal 1.5% 344 MOSM/L Soln 3,000 mLs by Dialysis route continuous for 10 days. Add 750 mg vancomycin to solution on days that vancomycin level is less than 22 mcg/mL  check random vancomycin level in 2-3 days after previous dose was administered   diltiazem 240 MG 24 hr capsule Commonly known as: CARDIZEM CD Take 1 capsule (240 mg total) by mouth daily. What changed:  medication strength how much to take   diltiazem 60 MG tablet Commonly known as: CARDIZEM Take 60 mg by mouth 4 (four) times daily as needed (for rapid hr).   furosemide 80 MG tablet Commonly known as: LASIX Take 80 mg by mouth 2 (two) times daily.   lidocaine-prilocaine cream Commonly known as: EMLA Apply 1 application topically as needed (prior to dialysis).   methocarbamol 500 MG tablet Commonly known as: ROBAXIN Take 500 mg by mouth at bedtime.   nitroGLYCERIN 0.4 MG SL tablet Commonly known as: NITROSTAT Place 1 tablet (0.4 mg total) under the tongue as directed. What changed:  when to take this reasons to take this   oxyCODONE-acetaminophen 5-325 MG tablet Commonly known as: PERCOCET/ROXICET Take 1 tablet by mouth every 6 (six) hours as needed for severe pain.   sildenafil 50 MG tablet Commonly known as:  VIAGRA Take 50 mg by mouth daily as needed.   sodium bicarbonate 650 MG tablet Take 1,300 mg by mouth 3 (three) times daily.   tamsulosin 0.4 MG Caps capsule Commonly known as: FLOMAX Take 1 capsule by mouth daily.               Discharge Care Instructions  (From admission, onward)           Start     Ordered   05/13/21 0000  Discharge wound care:       Comments: Follow with visiting RN.   05/13/21 1104            Follow-up Information     Neale Burly, MD Follow up on 05/21/2021.   Specialty: Internal Medicine Why: @ 11:00am Contact information: [place] [address] [id number] [phone]         Satira Sark, MD Follow up on 05/15/2021.   Specialty: Cardiology Why: '@2' :30pm  patient will see NP Contact information: [address] [place] [address] [phone]                 Signed: Sharen Hones 05/14/2021, 10:05 AM

## 2021-05-14 NOTE — Care Management Important Message (Signed)
Important Message  Patient Details  Name: Rodney Gordon MRN: [id number] Date of Birth: Jun 05, 1956   Medicare Important Message Given:  Yes     Dannette Barbara 05/14/2021, 4:08 PM

## 2021-05-14 NOTE — Progress Notes (Signed)
CSW spoke with patient's sister Gwyndolyn Saxon who reports she is on the way to pick patient up and leaving [place] now. Reports they live about an hour away.   Nurse made aware.   No other discharge needs.   [place], [place]

## 2021-05-14 NOTE — TOC Progression Note (Signed)
Transition of Care [facility]) - Progression Note    Patient Details  Name: Rodney Gordon MRN: [id number] Date of Birth: 1955-09-07  Transition of Care [facility]) CM/SW [place], [place] Phone Number: 05/14/2021, 9:22 AM  Clinical Narrative:     CSW spoke with Carolynn Sayers with IV home infusion she reports she will arrive at hospital around 10:30 am today to complete teaching with patient. Requests patient be discharged this afternoon to ensure everything is set up at home.   CSW spoke with Corene Cornea with Agency who confirms they are able to accept patient with start of services tomorrow 10/25.   No other discharge needs identified at this time.    Expected Discharge Plan: [place] Barriers to Discharge: Continued Medical Work up  Expected Discharge Plan and Services Expected Discharge Plan: [place]   Discharge Planning Services: CM Consult Post Acute Care Choice: [place] arrangements for the past 2 months: Apartment Expected Discharge Date: 05/13/21                         HH Arranged: RN, IV Antibiotics HH Agency: [place] ([place]) Date HH Agency Contacted: 05/13/21 Time [place]: 1330 Representative spoke with at [place]: Carolynn Sayers spoke with Floydene Flock for verifiication on [place] services   Social Determinants of Health (SDOH) Interventions    Readmission Risk Interventions No flowsheet data found.

## 2021-05-14 NOTE — Progress Notes (Signed)
[facility]  ROUNDING NOTE   Subjective:   Rodney Gordon is a 65 year old male with past medical history of anemia, CAD, hypertension, PVD, and end-stage renal disease on peritoneal dialysis.  He presents to the emergency room today with complaints of abdominal pain and was directed by nephrologist for further evaluation.  He will be admitted for Peritonitis [facility]) [K65.9] Abdominal pain [R10.9]   Patient is known to our clinic and receives outpatient dialysis at [facility], supervised by Dr. Holley Raring.    Patient seen resting in bed Complains of discomfort during dialysis, but it has improved.  Objective:  Vital signs in last 24 hours:  Temp:  [98 F (36.7 C)-98.5 F (36.9 C)] 98.3 F (36.8 C) (10/24 1347) Pulse Rate:  [78-96] 91 (10/24 1347) Resp:  [16-20] 16 (10/24 1347) BP: (106-144)/(68-81) 111/76 (10/24 1347) SpO2:  [97 %-100 %] 97 % (10/24 1347) Weight:  [77.3 kg-77.4 kg] 77.3 kg (10/24 0254)  Weight change: 0.6 kg Filed Weights   05/13/21 0511 05/13/21 2029 05/14/21 0254  Weight: 76.9 kg 77.4 kg 77.3 kg    Intake/Output: I/O last 3 completed shifts: In: 4627 [P.O.:240; I.V.:6; Other:1200] Out: 03500 [Urine:600; XFGHW:29937]   Intake/Output this shift:  Total I/O In: 600 [P.O.:600] Out: -   Physical Exam: General: NAD  Head: Normocephalic, atraumatic. Moist oral mucosal membranes  Eyes: Anicteric  Lungs:  Clear to auscultation, normal effort  Heart: Irregular rhythm  Abdomen:  Soft, nontender, nondistended  Extremities: No peripheral edema.  Neurologic: Alert/oriented moving all four extremities  Skin: No lesions  Access: PD catheter    Basic Metabolic Panel: Recent Labs  Lab 05/08/21 1616 05/08/21 1618 05/08/21 1618 05/10/21 0539 05/11/21 0548 05/12/21 0419  NA  --  128*  --  129* 132* 130*  K  --  4.9  --  3.5 4.0 3.9  CL  --  90*  --  93* 92* 91*  CO2  --  27  --  26 30 30   GLUCOSE  --  115*  --  113* 110* 100*  BUN  --   53*  --  51* 48* 48*  CREATININE  --  12.79*  --  12.78* 12.13* 11.57*  CALCIUM  --  8.9   < > 8.1* 8.1* 7.9*  MG 1.5*  --   --  1.5*  --  1.9  PHOS  --   --   --  3.7  --   --    < > = values in this interval not displayed.     Liver Function Tests: Recent Labs  Lab 05/08/21 1618  AST 22  ALT 6  ALKPHOS 159*  BILITOT 3.3*  PROT 7.1  ALBUMIN 2.2*    Recent Labs  Lab 05/08/21 1618  LIPASE 22    No results for input(s): AMMONIA in the last 168 hours.  CBC: Recent Labs  Lab 05/08/21 1618 05/09/21 0748 05/10/21 0539  WBC 23.8* 18.5* 13.0*  HGB 8.2* 6.9* 8.5*  HCT 24.4* 21.2* 25.5*  MCV 101.2* 100.0 95.5  PLT 583* 497* 403*     Cardiac Enzymes: No results for input(s): CKTOTAL, CKMB, CKMBINDEX, TROPONINI in the last 168 hours.  BNP: Invalid input(s): POCBNP  CBG: No results for input(s): GLUCAP in the last 168 hours.  Microbiology: Results for orders placed or performed during the hospital encounter of 05/08/21  Resp Panel by RT-PCR (Flu A&B, Covid) Nasopharyngeal Swab     Status: None   Collection Time: 05/08/21  10:05 PM   Specimen: Nasopharyngeal Swab; Nasopharyngeal(NP) swabs in vial transport medium  Result Value Ref Range Status   SARS Coronavirus 2 by RT PCR NEGATIVE NEGATIVE Final    Comment: (NOTE) SARS-CoV-2 target nucleic acids are NOT DETECTED.  The SARS-CoV-2 RNA is generally detectable in upper respiratory specimens during the acute phase of infection. The lowest concentration of SARS-CoV-2 viral copies this assay can detect is 138 copies/mL. A negative result does not preclude SARS-Cov-2 infection and should not be used as the sole basis for treatment or other patient management decisions. A negative result may occur with  improper specimen collection/handling, submission of specimen other than nasopharyngeal swab, presence of viral mutation(s) within the areas targeted by this assay, and inadequate number of viral copies(<138  copies/mL). A negative result must be combined with clinical observations, patient history, and epidemiological information. The expected result is Negative.  Fact Sheet for Patients:  EntrepreneurPulse.com.au  Fact Sheet for Healthcare Providers:  IncredibleEmployment.be  This test is no t yet approved or cleared by the [place] FDA and  has been authorized for detection and/or diagnosis of SARS-CoV-2 by FDA under an Emergency Use Authorization (EUA). This EUA will remain  in effect (meaning this test can be used) for the duration of the COVID-19 declaration under Section 564(b)(1) of the Act, 21 U.S.C.section 360bbb-3(b)(1), unless the authorization is terminated  or revoked sooner.       Influenza A by PCR NEGATIVE NEGATIVE Final   Influenza B by PCR NEGATIVE NEGATIVE Final    Comment: (NOTE) The Xpert Xpress SARS-CoV-2/FLU/RSV plus assay is intended as an aid in the diagnosis of influenza from Nasopharyngeal swab specimens and should not be used as a sole basis for treatment. Nasal washings and aspirates are unacceptable for Xpert Xpress SARS-CoV-2/FLU/RSV testing.  Fact Sheet for Patients: EntrepreneurPulse.com.au  Fact Sheet for Healthcare Providers: IncredibleEmployment.be  This test is not yet approved or cleared by the [place] FDA and has been authorized for detection and/or diagnosis of SARS-CoV-2 by FDA under an Emergency Use Authorization (EUA). This EUA will remain in effect (meaning this test can be used) for the duration of the COVID-19 declaration under Section 564(b)(1) of the Act, 21 U.S.C. section 360bbb-3(b)(1), unless the authorization is terminated or revoked.  Performed at [facility], [address] [place], [place] [address]   Blood culture (routine x 2)     Status: None   Collection Time: 05/08/21 10:05 PM   Specimen: BLOOD  Result Value Ref Range  Status   Specimen Description BLOOD LEFT FOREARM  Final   Special Requests   Final    BOTTLES DRAWN AEROBIC AND ANAEROBIC Blood Culture results may not be optimal due to an inadequate volume of blood received in culture bottles   Culture   Final    NO GROWTH 5 DAYS Performed at [facility], [address] [place], [place] [address]    Report Status 05/13/2021 FINAL  Final  Blood culture (routine x 2)     Status: None   Collection Time: 05/08/21 10:05 PM   Specimen: BLOOD  Result Value Ref Range Status   Specimen Description BLOOD LEFT FOREARM  Final   Special Requests   Final    BOTTLES DRAWN AEROBIC AND ANAEROBIC Blood Culture results may not be optimal due to an inadequate volume of blood received in culture bottles   Culture   Final    NO GROWTH 5 DAYS Performed at [facility], [place]  [address], [place], [place] [address]    Report Status 05/13/2021 FINAL  Final  Body fluid culture w Gram Stain     Status: None   Collection Time: 05/09/21  9:40 PM   Specimen: Peritoneal Dialysate; Body Fluid  Result Value Ref Range Status   Specimen Description   Final    PERITONEAL DIALYSATE Performed at [facility], [address] [place], [place] [address]    Special Requests   Final    NONE Performed at [facility], [address] [place], [place] [address]    Gram Stain   Final    MANY WBC SEEN NO ORGANISMS SEEN NO RBC SEEN Performed at [facility], [address], [place], [place] [address]    Culture   Final    NO GROWTH 3 DAYS Performed at [facility], [place] [address] [place], [place] [address]    Report Status 05/13/2021 FINAL  Final    Coagulation Studies: No results for input(s): LABPROT, INR in the last 72 hours.  Urinalysis: No results for input(s): COLORURINE, LABSPEC, PHURINE, GLUCOSEU, HGBUR, BILIRUBINUR, KETONESUR, PROTEINUR, UROBILINOGEN, NITRITE, LEUKOCYTESUR in the last 72 hours.  Invalid  input(s): APPERANCEUR    Imaging: No results found.   Medications:    dialysis solution 1.5% low-MG/low-CA      apixaban  5 mg Oral BID   atenolol  50 mg Oral Daily   And   chlorthalidone  25 mg Oral Daily   diltiazem  240 mg Oral Daily   furosemide  80 mg Oral BID   gentamicin cream  1 application Topical Daily   lactulose  20 g Oral Once   multivitamin  1 tablet Oral Daily   pantoprazole  40 mg Oral QHS   saccharomyces boulardii  250 mg Oral BID   sodium bicarbonate  1,300 mg Oral TID   sodium chloride flush  3 mL Intravenous Q12H   tamsulosin  0.4 mg Oral Daily   vancomycin variable dose per unstable renal function (pharmacist dosing)   Does not apply See admin instructions   acetaminophen, nitroGLYCERIN, oxyCODONE-acetaminophen  Assessment/ Plan:  [name] is a 65 y.o.  male with past medical history of anemia, CAD, hypertension, PVD, and end-stage renal disease on peritoneal dialysis.  He presents to the emergency room today with complaints of abdominal pain and was directed by nephrologist for further evaluation.  He will be admitted for Peritonitis ([place]) [K65.9] Abdominal pain [R10.9]  [facility] [place] CCPD- 4 cycles/3L fills/ no last fill/ 20/10  End-stage renal disease on peritoneal dialysis.  Will maintain outpatient nightly schedule.  Received intraperitoneal treatment with Vancomycin over the weekend. Will discharge on IP Vancomycin 750mg  for 14 days. Outpatient clinic notified.   2. Anemia of chronic kidney disease Lab Results  Component Value Date   HGB 8.5 (L) 05/10/2021  Hemoglobin remains below target. EPO 20k units subQ weekly.  3. Secondary Hyperparathyroidism:  Lab Results  Component Value Date   CALCIUM 7.9 (L) 05/12/2021   CAION 1.13 (L) 09/05/2017   PHOS 3.7 05/10/2021  Calcium not at goal. Phosphorus at goal Calcium acetate with meals  4.  Hypertension with chronic kidney disease.  Home regimen includes atenolol,  diltiazem, furosemide, losartan, and isosorbide. Losartan and isosorbide held. BP 111/76. Continue current treatment.  5.  Acute peritonitis.  IV Vanc ordered. Cultures obtained on 05/09/21 pending. Recently treated outpatient for MRSE peritonitis. Will treatment with IP Vancomycin outpatient.  LOS: 6 Miles Leyda 10/24/20222:05 PM

## 2021-05-14 NOTE — TOC Transition Note (Signed)
Transition of Care [facility]) - CM/SW Discharge Note   Patient Details  Name: TANVIR HIPPLE MRN: [id number] Date of Birth: 09-12-1955  Transition of Care [facility]) CM/SW Contact:  Alberteen Sam, LCSW Phone Number: 05/14/2021, 2:28 PM   Clinical Narrative:     CSW notes patient to dc home today, is [facility] up with [facility] for RN.   According to Carolynn Sayers with home infusions, patient goes to the [place] clinic in [place]. [facility] is his RN [phone]) and she will order all viles of vancomycin  patient needs, Pam to connect with Corene Cornea with [facility] for any needed follow up orders.  No other needs identified at this time.  Final next level of care: [place] Barriers to Discharge: No Barriers Identified   Patient Goals and CMS Choice Patient states their goals for this hospitalization and ongoing recovery are:: to go home CMS Medicare.gov Compare Post Acute Care list provided to:: Patient Choice offered to / list presented to : Patient  Discharge Placement                       Discharge Plan and Services   Discharge Planning Services: CM Consult Post Acute Care Choice: Home Health                    HH Arranged: RN [place]: [place] ([place]) Date [place]: 05/14/21 Time [place]: [place] Representative spoke with at [place]: [place] ([place]) Interventions     Readmission Risk Interventions No flowsheet data found.

## 2021-05-14 NOTE — Plan of Care (Signed)
  Problem: Education: Goal: Knowledge of General Education information will improve Description: Including pain rating scale, medication(s)/side effects and non-pharmacologic comfort measures Outcome: Adequate for Discharge   Problem: Health Behavior/Discharge Planning: Goal: Ability to manage health-related needs will improve Outcome: Adequate for Discharge   Problem: Clinical Measurements: Goal: Ability to maintain clinical measurements within normal limits will improve Outcome: Adequate for Discharge Goal: Will remain free from infection Outcome: Adequate for Discharge Goal: Diagnostic test results will improve Outcome: Adequate for Discharge Goal: Respiratory complications will improve Outcome: Adequate for Discharge Goal: Cardiovascular complication will be avoided Outcome: Adequate for Discharge   Problem: Activity: Goal: Risk for activity intolerance will decrease Outcome: Adequate for Discharge   Problem: Nutrition: Goal: Adequate nutrition will be maintained Outcome: Adequate for Discharge   Problem: Coping: Goal: Level of anxiety will decrease Outcome: Adequate for Discharge   Problem: Elimination: Goal: Will not experience complications related to bowel motility Outcome: Adequate for Discharge Goal: Will not experience complications related to urinary retention Outcome: Adequate for Discharge   Problem: Pain Managment: Goal: General experience of comfort will improve Outcome: Adequate for Discharge   Problem: Safety: Goal: Ability to remain free from injury will improve Outcome: Adequate for Discharge   Problem: Skin Integrity: Goal: Risk for impaired skin integrity will decrease Outcome: Adequate for Discharge  Pt dc home   

## 2021-05-14 NOTE — Progress Notes (Deleted)
Cardiology Office Note  Date: 05/14/2021   ID: Rodney Gordon, DOB 1955/08/31, MRN [id number]  PCP:  Neale Burly, MD  Cardiologist:  Rozann Lesches, MD Electrophysiologist:  None   Chief Complaint: 4-week follow-up  History of Present Illness: Rodney Gordon is a 65 y.o. male with a history of aortic valve insufficiency (mild to moderate), CAD nonobstructive by cardiac catheterization 2004.  ESRD on PD, HTN, PVD.  Recent admission on 05/08/2021 for abdominal pain.  He was currently being treated for peritonitis by nephrology and continued to have abdominal pain.  He has end-stage renal disease on PD.  He was treated for sepsis secondary to bacterial peritonitis.  He was noncompliant with Eliquis and was taking Cardizem for heart rate control.  His heart rate went up during hospitalization.  Diltiazem dose was increased.  He was continuing atenolol.   Past Medical History:  Diagnosis Date   Anemia of chronic disease    Aortic insufficiency    Mild to moderate   Coronary atherosclerosis of native coronary artery    Nonobstructive at catheterization 2004   ESRD on hemodialysis [facility])    [name]   Essential hypertension    Peripheral vascular disease [facility])     Past Surgical History:  Procedure Laterality Date   A/V FISTULAGRAM Right 09/05/2017   Procedure: A/V FISTULAGRAM;  Surgeon: Angelia Mould, MD;  Location: [place] [facility];  Service: Cardiovascular;  Laterality: Right;   AV FISTULA PLACEMENT Right 12/17/2016   Procedure: ARTERIOVENOUS (AV) FISTULA CREATION- RIGHT BRACHIOCEPHALIC;  Surgeon: Elam Dutch, MD;  Location: [place] [facility];  Service: Vascular;  Laterality: Right;   CAPD INSERTION N/A 11/03/2017   Procedure: LAPAROSCOPIC INSERTION CONTINUOUS AMBULATORY PERITONEAL DIALYSIS  (CAPD) CATHETER;  Surgeon: Clovis Riley, MD;  Location: [place];  Service: General;  Laterality: N/A;   CARDIAC CATHETERIZATION     [facility]   IR DIALY SHUNT INTRO  [place] W/IMG RIGHT Right 04/24/2017   IR US GUIDE VASC ACCESS RIGHT  04/24/2017   PERIPHERAL VASCULAR BALLOON ANGIOPLASTY Right 09/05/2017   Procedure: PERIPHERAL VASCULAR BALLOON ANGIOPLASTY;  Surgeon: Angelia Mould, MD;  Location: [place] [facility];  Service: Cardiovascular;  Laterality: Right;   PROSTATE SURGERY     TIBIA FRACTURE SURGERY     Left    Current Outpatient Medications  Medication Sig Dispense Refill   acetaminophen (TYLENOL) 650 MG CR tablet Take 650 mg by mouth daily as needed for pain.     apixaban (ELIQUIS) 5 MG TABS tablet Take 1 tablet (5 mg total) by mouth 2 (two) times daily. 60 tablet 0   atenolol-chlorthalidone (TENORETIC) 50-25 MG tablet Take 1 tablet by mouth daily.     diltiazem (CARDIZEM CD) 240 MG 24 hr capsule Take 1 capsule (240 mg total) by mouth daily. 30 capsule 0   diltiazem (CARDIZEM) 60 MG tablet Take 60 mg by mouth 4 (four) times daily as needed (for rapid hr).     furosemide (LASIX) 80 MG tablet Take 80 mg by mouth 2 (two) times daily.     lidocaine-prilocaine (EMLA) cream Apply 1 application topically as needed (prior to dialysis).      methocarbamol (ROBAXIN) 500 MG tablet Take 500 mg by mouth at bedtime.     nitroGLYCERIN (NITROSTAT) 0.4 MG SL tablet Place 1 tablet (0.4 mg total) under the tongue as directed. (Patient taking differently: Place 0.4 mg under the tongue every 5 (five) minutes as needed for chest  pain.) 25 tablet 2   oxyCODONE-acetaminophen (PERCOCET/ROXICET) 5-325 MG tablet Take 1 tablet by mouth every 6 (six) hours as needed for severe pain. 20 tablet 0   Peritoneal Dialysis Solutions (DIANEAL LO-CAL 1.5%) 344 MOSM/L SOLN 3,000 mLs by Dialysis route continuous for 10 days. Add 750 mg vancomycin to solution on days that vancomycin level is less than 22 mcg/mL  check random vancomycin level in 2-3 days after previous dose was administered 6000 mL ML   sildenafil (VIAGRA) 50 MG tablet Take 50 mg by mouth daily as  needed.     sodium bicarbonate 650 MG tablet Take 1,300 mg by mouth 3 (three) times daily.      tamsulosin (FLOMAX) 0.4 MG CAPS capsule Take 1 capsule by mouth daily.     No current facility-administered medications for this visit.   Facility-Administered Medications Ordered in Other Visits  Medication Dose Route Frequency Provider Last Rate Last Admin   acetaminophen (TYLENOL) tablet 650 mg  650 mg Oral Daily PRN Para Skeans, MD       apixaban Arne Cleveland) tablet 5 mg  5 mg Oral BID Florina Ou V, MD   5 mg at 05/14/21 0950   atenolol (TENORMIN) tablet 50 mg  50 mg Oral Daily Renda Rolls, RPH   50 mg at 05/14/21 3500   And   chlorthalidone (HYGROTON) tablet 25 mg  25 mg Oral Daily Renda Rolls, RPH   25 mg at 05/14/21 9381   dialysis solution 1.5% low-MG/low-CA dianeal solution   Intraperitoneal Q24H Oswald Hillock, RPH       diltiazem (CARDIZEM CD) 24 hr capsule 240 mg  240 mg Oral Daily Sharen Hones, MD   240 mg at 05/14/21 0950   furosemide (LASIX) tablet 80 mg  80 mg Oral BID Para Skeans, MD   80 mg at 05/14/21 0950   gentamicin cream (GARAMYCIN) 0.1 % 1 application  1 application Topical Daily Colon Flattery, NP   1 application at 82/99/37 0951   lactulose (CHRONULAC) 10 GM/15ML solution 20 g  20 g Oral Once Sharen Hones, MD       multivitamin (RENA-VIT) tablet 1 tablet  1 tablet Oral Daily Para Skeans, MD   1 tablet at 05/14/21 0951   nitroGLYCERIN (NITROSTAT) SL tablet 0.4 mg  0.4 mg Sublingual Q5 min PRN Para Skeans, MD       oxyCODONE-acetaminophen (PERCOCET/ROXICET) 5-325 MG per tablet 1 tablet  1 tablet Oral Q6H PRN Para Skeans, MD   1 tablet at 05/12/21 2257   pantoprazole (PROTONIX) EC tablet 40 mg  40 mg Oral QHS Sharen Hones, MD   40 mg at 05/13/21 2011   saccharomyces boulardii (FLORASTOR) capsule 250 mg  250 mg Oral BID Sharion Settler, NP   250 mg at 05/14/21 1696   sodium bicarbonate tablet 1,300 mg  1,300 mg Oral TID Para Skeans, MD   1,300 mg at  05/14/21 1539   sodium chloride flush (NS) 0.9 % injection 3 mL  3 mL Intravenous Q12H Florina Ou V, MD   3 mL at 05/14/21 0951   tamsulosin (FLOMAX) capsule 0.4 mg  0.4 mg Oral Daily Florina Ou V, MD   0.4 mg at 05/14/21 0950   vancomycin variable dose per unstable renal function (pharmacist dosing)   Does not apply See admin instructions Renda Rolls, RPH       Allergies:  Patient has no known allergies.   Social History: The  patient  reports that he quit smoking about 22 years ago. His smoking use included cigarettes. He started smoking about 39 years ago. He has a 25.00 pack-year smoking history. He has never used smokeless tobacco. He reports that he does not drink alcohol and does not use drugs.   Family History: The patient's family history includes Cancer in an other family member; Coronary artery disease in an other family member.   ROS:  Please see the history of present illness. Otherwise, complete review of systems is positive for none.  All other systems are reviewed and negative.   Physical Exam: VS:  There were no vitals taken for this visit., BMI There is no height or weight on file to calculate BMI.  Wt Readings from Last 3 Encounters:  05/14/21 170 lb 6.7 oz (77.3 kg)  03/16/21 171 lb (77.6 kg)  02/13/21 166 lb (75.3 kg)    General: Patient appears comfortable at rest. Neck: Supple, no elevated JVP or carotid bruits, no thyromegaly. Lungs: Clear to auscultation, nonlabored breathing at rest. Cardiac: Irregularly irregular rate and rhythm, no S3 or significant systolic murmur, no pericardial rub. Abdomen: Soft, nontender, PD catheter present. Extremities: No pitting edema, distal pulses 2+. Skin: Warm and dry. Musculoskeletal: No kyphosis. Neuropsychiatric: Alert and oriented x3, affect grossly appropriate.  ECG: EKG 02/13/2021 atrial fibrillation with RVR with premature ventricular aberrantly conducted complexes rate 124.  Nonspecific ST and T wave  abnormality  Recent Labwork: 05/08/2021: ALT 6; AST 22 05/10/2021: Hemoglobin 8.5; Platelets 403 05/12/2021: BUN 48; Creatinine, Ser 11.57; Magnesium 1.9; Potassium 3.9; Sodium 130  No results found for: CHOL, TRIG, HDL, CHOLHDL, VLDL, LDLCALC, LDLDIRECT  Other Studies Reviewed Today:  Echocardiogram 02/28/2021   1. No significant change from previous echo images of Aug 2021.   2. Hypokinesis/akinesis of the basal inferior, inferoseptal and  inferolateral walls . Left ventricular ejection fraction, by estimation,  is 55 to 60%. The left ventricle has normal function. There is severe  asymmetric left ventricular hypertrophy. Left  ventricular diastolic parameters are indeterminate.   3. Right ventricular systolic function is normal. The right ventricular  size is normal. There is normal pulmonary artery systolic pressure.   4. Left atrial size was mildly dilated.   5. Right atrial size was mildly dilated.   6. Very mild systolic anterior motion of the mitral valve. Mild mitral  valve regurgitation.   7. The aortic valve is tricuspid. Aortic valve regurgitation is mild to  moderate. Mild to moderate aortic valve sclerosis/calcification is  present, without any evidence of aortic stenosis.   8. The inferior vena cava is dilated in size with >50% respiratory  variability, suggesting right atrial pressure of 8 mmHg.   Echocardiogram 03/14/2020 1. Left ventricular ejection fraction, by estimation, is 70 to 75%. The left ventricle has hyperdynamic function. The left ventricle has no regional wall motion abnormalities. There is mild left ventricular hypertrophy. Left ventricular diastolic parameters are consistent with Grade I diastolic dysfunction (impaired relaxation). 2. Right ventricular systolic function is normal. The right ventricular size is normal. There is normal pulmonary artery systolic pressure. The estimated right ventricular systolic pressure is 66.4 mmHg. 3. The mitral  valve is grossly normal. Trivial mitral valve regurgitation. 4. The aortic valve is tricuspid, mildly calcified with decreased left coronary cusp motion but no stenosis. Aortic valve regurgitation is moderate. 5. The inferior vena cava is normal in size with greater than 50% respiratory variability, suggesting right atrial pressure of 3 mmHg.  Echocardiogram 02/04/2019:  1. The left ventricle has normal systolic function with an ejection fraction of 60-65%. The cavity size was normal. Left ventricular diastolic Doppler parameters are consistent with impaired relaxation. Elevated left ventricular end-diastolic pressure  No evidence of left ventricular regional wall motion abnormalities.  2. The right ventricle has low normal systolic function. The cavity was normal. There is mildly increased right ventricular wall thickness.  3. Left atrial size was mildly dilated.  4. The aortic valve is tricuspid. Mild thickening of the aortic valve. Aortic valve regurgitation is moderate by color flow Doppler. Moderate aortic annular calcification noted.  5. The mitral valve is grossly normal. Mitral valve regurgitation is moderate by color flow Doppler.  6. The tricuspid valve is grossly normal.  7. The aortic root is normal in size and structure.  Assessment and Plan:  1. Atrial fibrillation with RVR ([place])   2. Aortic valve insufficiency, etiology of cardiac valve disease unspecified   3. ESRD (end stage renal disease) ([place])   4. Essential hypertension   5. CAD in native artery      1.  Atrial fibrillation RVR At prior visit he was discovered to be in new onset atrial fibrillation.  He was started on diltiazem 120 mg daily along with Eliquis 5 mg p.o. daily.  Recently had a visit to the emergency room 03/13/2021 [facility] with rapid heart rate/palpitations.  He was in atrial fibrillation with RVR.  He was started on a Cardizem drip and transitioned to p.o. Cardizem.  He was given as needed  Cardizem 60 mg 4 times daily for rapid heart rate.  He denies any further episodes of palpitations today.  Denies any bleeding on Eliquis.  Continue Eliquis 5 mg p.o. daily.  He has taken his Eliquis uninterrupted since last visit.  Will send this note to primary cardiologist Dr. Domenic Polite to see if he would like to schedule patient for DC cardioversion.  Addendum 03/19/2021.  Spoke with patient by phone regarding DC cardioversion and the procedure risk and benefits.  Patient elects not to undergo cardioversion at this time.  He prefers rate control strategy.  2. Aortic valve insufficiency, etiology of cardiac valve disease unspecified  At last visit he stated he had some mild dyspnea on moderate exertion but quickly recovered in a few minutes.  Please Recent repeat echocardiogram showed no significant change from previous echo images of August 2021 hypokinesis/akinesis of basal inferior, inferior septal and inferior lateral walls.  EF 55 to 60%.  Severe 80 symmetric LVH.  LA mildly dilated, RA mildly dilated, very mild systolic anterior motion of the mitral valve.  Mild mitral regurgitation   3. ESRD on peritoneal dialysis  Patient states his dialysis has been going well. No recent issues with PD catheter.  Follows with nephrology.  4. Essential hypertension BP today 132/78.  Continue atenolol / chlorthalidone 50/25 mg p.o. daily.  Continue Lasix 80 mg p.o. daily.  Continue losartan 50 mg p.o. daily   5.  CAD. Denies any anginal symptoms.  Does have some mild dyspnea on exertion. Continue aspirin 81 mg, Imdur 30 mg daily, nitroglycerin sublingual as needed for chest pain.    Medication Adjustments/Labs and Tests Ordered: Current medicines are reviewed at length with the patient today.  Concerns regarding medicines are outlined above.   Disposition: Follow-up with Dr. Domenic Polite or APP 6 months Signed, Levell July, NP 05/14/2021 8:01 PM    [facility] [place], [place],  [place] [address] Phone: [phone]; Fax: [phone]

## 2021-05-15 ENCOUNTER — Ambulatory Visit: Payer: Medicare Other | Admitting: Family Medicine

## 2021-05-15 DIAGNOSIS — N186 End stage renal disease: Secondary | ICD-10-CM | POA: Diagnosis not present

## 2021-05-15 DIAGNOSIS — K659 Peritonitis, unspecified: Secondary | ICD-10-CM | POA: Diagnosis not present

## 2021-05-15 DIAGNOSIS — Z792 Long term (current) use of antibiotics: Secondary | ICD-10-CM | POA: Diagnosis not present

## 2021-05-15 DIAGNOSIS — N2581 Secondary hyperparathyroidism of renal origin: Secondary | ICD-10-CM | POA: Diagnosis not present

## 2021-05-15 DIAGNOSIS — Z992 Dependence on renal dialysis: Secondary | ICD-10-CM | POA: Diagnosis not present

## 2021-05-15 DIAGNOSIS — D509 Iron deficiency anemia, unspecified: Secondary | ICD-10-CM | POA: Diagnosis not present

## 2021-05-16 DIAGNOSIS — D509 Iron deficiency anemia, unspecified: Secondary | ICD-10-CM | POA: Diagnosis not present

## 2021-05-16 DIAGNOSIS — E871 Hypo-osmolality and hyponatremia: Secondary | ICD-10-CM | POA: Diagnosis not present

## 2021-05-16 DIAGNOSIS — K659 Peritonitis, unspecified: Secondary | ICD-10-CM | POA: Diagnosis not present

## 2021-05-16 DIAGNOSIS — I2510 Atherosclerotic heart disease of native coronary artery without angina pectoris: Secondary | ICD-10-CM | POA: Diagnosis not present

## 2021-05-16 DIAGNOSIS — Z992 Dependence on renal dialysis: Secondary | ICD-10-CM | POA: Diagnosis not present

## 2021-05-16 DIAGNOSIS — I351 Nonrheumatic aortic (valve) insufficiency: Secondary | ICD-10-CM | POA: Diagnosis not present

## 2021-05-16 DIAGNOSIS — I4819 Other persistent atrial fibrillation: Secondary | ICD-10-CM | POA: Diagnosis not present

## 2021-05-16 DIAGNOSIS — N186 End stage renal disease: Secondary | ICD-10-CM | POA: Diagnosis not present

## 2021-05-16 DIAGNOSIS — N2581 Secondary hyperparathyroidism of renal origin: Secondary | ICD-10-CM | POA: Diagnosis not present

## 2021-05-16 DIAGNOSIS — Z87891 Personal history of nicotine dependence: Secondary | ICD-10-CM | POA: Diagnosis not present

## 2021-05-16 DIAGNOSIS — Z7901 Long term (current) use of anticoagulants: Secondary | ICD-10-CM | POA: Diagnosis not present

## 2021-05-16 DIAGNOSIS — Z792 Long term (current) use of antibiotics: Secondary | ICD-10-CM | POA: Diagnosis not present

## 2021-05-16 DIAGNOSIS — I120 Hypertensive chronic kidney disease with stage 5 chronic kidney disease or end stage renal disease: Secondary | ICD-10-CM | POA: Diagnosis not present

## 2021-05-16 DIAGNOSIS — D631 Anemia in chronic kidney disease: Secondary | ICD-10-CM | POA: Diagnosis not present

## 2021-05-16 DIAGNOSIS — A411 Sepsis due to other specified staphylococcus: Secondary | ICD-10-CM | POA: Diagnosis not present

## 2021-05-16 DIAGNOSIS — K652 Spontaneous bacterial peritonitis: Secondary | ICD-10-CM | POA: Diagnosis not present

## 2021-05-16 DIAGNOSIS — I739 Peripheral vascular disease, unspecified: Secondary | ICD-10-CM | POA: Diagnosis not present

## 2021-05-17 DIAGNOSIS — Z792 Long term (current) use of antibiotics: Secondary | ICD-10-CM | POA: Diagnosis not present

## 2021-05-17 DIAGNOSIS — D509 Iron deficiency anemia, unspecified: Secondary | ICD-10-CM | POA: Diagnosis not present

## 2021-05-17 DIAGNOSIS — Z992 Dependence on renal dialysis: Secondary | ICD-10-CM | POA: Diagnosis not present

## 2021-05-17 DIAGNOSIS — N186 End stage renal disease: Secondary | ICD-10-CM | POA: Diagnosis not present

## 2021-05-17 DIAGNOSIS — K659 Peritonitis, unspecified: Secondary | ICD-10-CM | POA: Diagnosis not present

## 2021-05-17 DIAGNOSIS — N2581 Secondary hyperparathyroidism of renal origin: Secondary | ICD-10-CM | POA: Diagnosis not present

## 2021-05-18 DIAGNOSIS — N186 End stage renal disease: Secondary | ICD-10-CM | POA: Diagnosis not present

## 2021-05-18 DIAGNOSIS — D509 Iron deficiency anemia, unspecified: Secondary | ICD-10-CM | POA: Diagnosis not present

## 2021-05-18 DIAGNOSIS — Z992 Dependence on renal dialysis: Secondary | ICD-10-CM | POA: Diagnosis not present

## 2021-05-18 DIAGNOSIS — N2581 Secondary hyperparathyroidism of renal origin: Secondary | ICD-10-CM | POA: Diagnosis not present

## 2021-05-18 DIAGNOSIS — K659 Peritonitis, unspecified: Secondary | ICD-10-CM | POA: Diagnosis not present

## 2021-05-18 DIAGNOSIS — Z792 Long term (current) use of antibiotics: Secondary | ICD-10-CM | POA: Diagnosis not present

## 2021-05-19 DIAGNOSIS — N186 End stage renal disease: Secondary | ICD-10-CM | POA: Diagnosis not present

## 2021-05-19 DIAGNOSIS — Z992 Dependence on renal dialysis: Secondary | ICD-10-CM | POA: Diagnosis not present

## 2021-05-19 DIAGNOSIS — Z792 Long term (current) use of antibiotics: Secondary | ICD-10-CM | POA: Diagnosis not present

## 2021-05-19 DIAGNOSIS — D509 Iron deficiency anemia, unspecified: Secondary | ICD-10-CM | POA: Diagnosis not present

## 2021-05-19 DIAGNOSIS — N2581 Secondary hyperparathyroidism of renal origin: Secondary | ICD-10-CM | POA: Diagnosis not present

## 2021-05-19 DIAGNOSIS — K659 Peritonitis, unspecified: Secondary | ICD-10-CM | POA: Diagnosis not present

## 2021-05-20 DIAGNOSIS — N2581 Secondary hyperparathyroidism of renal origin: Secondary | ICD-10-CM | POA: Diagnosis not present

## 2021-05-20 DIAGNOSIS — D509 Iron deficiency anemia, unspecified: Secondary | ICD-10-CM | POA: Diagnosis not present

## 2021-05-20 DIAGNOSIS — K659 Peritonitis, unspecified: Secondary | ICD-10-CM | POA: Diagnosis not present

## 2021-05-20 DIAGNOSIS — Z992 Dependence on renal dialysis: Secondary | ICD-10-CM | POA: Diagnosis not present

## 2021-05-20 DIAGNOSIS — Z792 Long term (current) use of antibiotics: Secondary | ICD-10-CM | POA: Diagnosis not present

## 2021-05-20 DIAGNOSIS — N186 End stage renal disease: Secondary | ICD-10-CM | POA: Diagnosis not present

## 2021-05-21 DIAGNOSIS — I2510 Atherosclerotic heart disease of native coronary artery without angina pectoris: Secondary | ICD-10-CM | POA: Diagnosis not present

## 2021-05-21 DIAGNOSIS — K659 Peritonitis, unspecified: Secondary | ICD-10-CM | POA: Diagnosis not present

## 2021-05-21 DIAGNOSIS — D509 Iron deficiency anemia, unspecified: Secondary | ICD-10-CM | POA: Diagnosis not present

## 2021-05-21 DIAGNOSIS — A411 Sepsis due to other specified staphylococcus: Secondary | ICD-10-CM | POA: Diagnosis not present

## 2021-05-21 DIAGNOSIS — Z992 Dependence on renal dialysis: Secondary | ICD-10-CM | POA: Diagnosis not present

## 2021-05-21 DIAGNOSIS — I4819 Other persistent atrial fibrillation: Secondary | ICD-10-CM | POA: Diagnosis not present

## 2021-05-21 DIAGNOSIS — I120 Hypertensive chronic kidney disease with stage 5 chronic kidney disease or end stage renal disease: Secondary | ICD-10-CM | POA: Diagnosis not present

## 2021-05-21 DIAGNOSIS — N186 End stage renal disease: Secondary | ICD-10-CM | POA: Diagnosis not present

## 2021-05-21 DIAGNOSIS — N2581 Secondary hyperparathyroidism of renal origin: Secondary | ICD-10-CM | POA: Diagnosis not present

## 2021-05-21 DIAGNOSIS — K652 Spontaneous bacterial peritonitis: Secondary | ICD-10-CM | POA: Diagnosis not present

## 2021-05-21 DIAGNOSIS — Z792 Long term (current) use of antibiotics: Secondary | ICD-10-CM | POA: Diagnosis not present

## 2021-05-22 DIAGNOSIS — Z5181 Encounter for therapeutic drug level monitoring: Secondary | ICD-10-CM | POA: Diagnosis not present

## 2021-05-22 DIAGNOSIS — N186 End stage renal disease: Secondary | ICD-10-CM | POA: Diagnosis not present

## 2021-05-22 DIAGNOSIS — N2581 Secondary hyperparathyroidism of renal origin: Secondary | ICD-10-CM | POA: Diagnosis not present

## 2021-05-22 DIAGNOSIS — Z23 Encounter for immunization: Secondary | ICD-10-CM | POA: Diagnosis not present

## 2021-05-22 DIAGNOSIS — D631 Anemia in chronic kidney disease: Secondary | ICD-10-CM | POA: Diagnosis not present

## 2021-05-22 DIAGNOSIS — Z992 Dependence on renal dialysis: Secondary | ICD-10-CM | POA: Diagnosis not present

## 2021-05-22 DIAGNOSIS — D509 Iron deficiency anemia, unspecified: Secondary | ICD-10-CM | POA: Diagnosis not present

## 2021-05-22 DIAGNOSIS — Z792 Long term (current) use of antibiotics: Secondary | ICD-10-CM | POA: Diagnosis not present

## 2021-05-23 DIAGNOSIS — N186 End stage renal disease: Secondary | ICD-10-CM | POA: Diagnosis not present

## 2021-05-23 DIAGNOSIS — D509 Iron deficiency anemia, unspecified: Secondary | ICD-10-CM | POA: Diagnosis not present

## 2021-05-23 DIAGNOSIS — Z792 Long term (current) use of antibiotics: Secondary | ICD-10-CM | POA: Diagnosis not present

## 2021-05-23 DIAGNOSIS — D631 Anemia in chronic kidney disease: Secondary | ICD-10-CM | POA: Diagnosis not present

## 2021-05-23 DIAGNOSIS — Z23 Encounter for immunization: Secondary | ICD-10-CM | POA: Diagnosis not present

## 2021-05-23 DIAGNOSIS — Z992 Dependence on renal dialysis: Secondary | ICD-10-CM | POA: Diagnosis not present

## 2021-05-24 DIAGNOSIS — D509 Iron deficiency anemia, unspecified: Secondary | ICD-10-CM | POA: Diagnosis not present

## 2021-05-24 DIAGNOSIS — D631 Anemia in chronic kidney disease: Secondary | ICD-10-CM | POA: Diagnosis not present

## 2021-05-24 DIAGNOSIS — Z992 Dependence on renal dialysis: Secondary | ICD-10-CM | POA: Diagnosis not present

## 2021-05-24 DIAGNOSIS — N186 End stage renal disease: Secondary | ICD-10-CM | POA: Diagnosis not present

## 2021-05-24 DIAGNOSIS — Z23 Encounter for immunization: Secondary | ICD-10-CM | POA: Diagnosis not present

## 2021-05-24 DIAGNOSIS — Z792 Long term (current) use of antibiotics: Secondary | ICD-10-CM | POA: Diagnosis not present

## 2021-05-25 DIAGNOSIS — D631 Anemia in chronic kidney disease: Secondary | ICD-10-CM | POA: Diagnosis not present

## 2021-05-25 DIAGNOSIS — Z792 Long term (current) use of antibiotics: Secondary | ICD-10-CM | POA: Diagnosis not present

## 2021-05-25 DIAGNOSIS — N186 End stage renal disease: Secondary | ICD-10-CM | POA: Diagnosis not present

## 2021-05-25 DIAGNOSIS — D509 Iron deficiency anemia, unspecified: Secondary | ICD-10-CM | POA: Diagnosis not present

## 2021-05-25 DIAGNOSIS — Z992 Dependence on renal dialysis: Secondary | ICD-10-CM | POA: Diagnosis not present

## 2021-05-25 DIAGNOSIS — Z23 Encounter for immunization: Secondary | ICD-10-CM | POA: Diagnosis not present

## 2021-05-26 DIAGNOSIS — Z23 Encounter for immunization: Secondary | ICD-10-CM | POA: Diagnosis not present

## 2021-05-26 DIAGNOSIS — D631 Anemia in chronic kidney disease: Secondary | ICD-10-CM | POA: Diagnosis not present

## 2021-05-26 DIAGNOSIS — N186 End stage renal disease: Secondary | ICD-10-CM | POA: Diagnosis not present

## 2021-05-26 DIAGNOSIS — D509 Iron deficiency anemia, unspecified: Secondary | ICD-10-CM | POA: Diagnosis not present

## 2021-05-26 DIAGNOSIS — Z792 Long term (current) use of antibiotics: Secondary | ICD-10-CM | POA: Diagnosis not present

## 2021-05-26 DIAGNOSIS — Z992 Dependence on renal dialysis: Secondary | ICD-10-CM | POA: Diagnosis not present

## 2021-05-27 DIAGNOSIS — N186 End stage renal disease: Secondary | ICD-10-CM | POA: Diagnosis not present

## 2021-05-27 DIAGNOSIS — Z792 Long term (current) use of antibiotics: Secondary | ICD-10-CM | POA: Diagnosis not present

## 2021-05-27 DIAGNOSIS — Z992 Dependence on renal dialysis: Secondary | ICD-10-CM | POA: Diagnosis not present

## 2021-05-27 DIAGNOSIS — D509 Iron deficiency anemia, unspecified: Secondary | ICD-10-CM | POA: Diagnosis not present

## 2021-05-27 DIAGNOSIS — D631 Anemia in chronic kidney disease: Secondary | ICD-10-CM | POA: Diagnosis not present

## 2021-05-27 DIAGNOSIS — Z23 Encounter for immunization: Secondary | ICD-10-CM | POA: Diagnosis not present

## 2021-05-28 DIAGNOSIS — N186 End stage renal disease: Secondary | ICD-10-CM | POA: Diagnosis not present

## 2021-05-28 DIAGNOSIS — Z23 Encounter for immunization: Secondary | ICD-10-CM | POA: Diagnosis not present

## 2021-05-28 DIAGNOSIS — D509 Iron deficiency anemia, unspecified: Secondary | ICD-10-CM | POA: Diagnosis not present

## 2021-05-28 DIAGNOSIS — Z792 Long term (current) use of antibiotics: Secondary | ICD-10-CM | POA: Diagnosis not present

## 2021-05-28 DIAGNOSIS — D631 Anemia in chronic kidney disease: Secondary | ICD-10-CM | POA: Diagnosis not present

## 2021-05-28 DIAGNOSIS — Z992 Dependence on renal dialysis: Secondary | ICD-10-CM | POA: Diagnosis not present

## 2021-05-29 DIAGNOSIS — N186 End stage renal disease: Secondary | ICD-10-CM | POA: Diagnosis not present

## 2021-05-29 DIAGNOSIS — D631 Anemia in chronic kidney disease: Secondary | ICD-10-CM | POA: Diagnosis not present

## 2021-05-29 DIAGNOSIS — Z23 Encounter for immunization: Secondary | ICD-10-CM | POA: Diagnosis not present

## 2021-05-29 DIAGNOSIS — Z792 Long term (current) use of antibiotics: Secondary | ICD-10-CM | POA: Diagnosis not present

## 2021-05-29 DIAGNOSIS — D509 Iron deficiency anemia, unspecified: Secondary | ICD-10-CM | POA: Diagnosis not present

## 2021-05-29 DIAGNOSIS — Z992 Dependence on renal dialysis: Secondary | ICD-10-CM | POA: Diagnosis not present

## 2021-05-30 DIAGNOSIS — I2510 Atherosclerotic heart disease of native coronary artery without angina pectoris: Secondary | ICD-10-CM | POA: Diagnosis not present

## 2021-05-30 DIAGNOSIS — K652 Spontaneous bacterial peritonitis: Secondary | ICD-10-CM | POA: Diagnosis not present

## 2021-05-30 DIAGNOSIS — Z792 Long term (current) use of antibiotics: Secondary | ICD-10-CM | POA: Diagnosis not present

## 2021-05-30 DIAGNOSIS — Z992 Dependence on renal dialysis: Secondary | ICD-10-CM | POA: Diagnosis not present

## 2021-05-30 DIAGNOSIS — N186 End stage renal disease: Secondary | ICD-10-CM | POA: Diagnosis not present

## 2021-05-30 DIAGNOSIS — I120 Hypertensive chronic kidney disease with stage 5 chronic kidney disease or end stage renal disease: Secondary | ICD-10-CM | POA: Diagnosis not present

## 2021-05-30 DIAGNOSIS — D631 Anemia in chronic kidney disease: Secondary | ICD-10-CM | POA: Diagnosis not present

## 2021-05-30 DIAGNOSIS — I4819 Other persistent atrial fibrillation: Secondary | ICD-10-CM | POA: Diagnosis not present

## 2021-05-30 DIAGNOSIS — Z23 Encounter for immunization: Secondary | ICD-10-CM | POA: Diagnosis not present

## 2021-05-30 DIAGNOSIS — D509 Iron deficiency anemia, unspecified: Secondary | ICD-10-CM | POA: Diagnosis not present

## 2021-05-30 DIAGNOSIS — A411 Sepsis due to other specified staphylococcus: Secondary | ICD-10-CM | POA: Diagnosis not present

## 2021-05-31 DIAGNOSIS — Z792 Long term (current) use of antibiotics: Secondary | ICD-10-CM | POA: Diagnosis not present

## 2021-05-31 DIAGNOSIS — Z992 Dependence on renal dialysis: Secondary | ICD-10-CM | POA: Diagnosis not present

## 2021-05-31 DIAGNOSIS — Z23 Encounter for immunization: Secondary | ICD-10-CM | POA: Diagnosis not present

## 2021-05-31 DIAGNOSIS — D509 Iron deficiency anemia, unspecified: Secondary | ICD-10-CM | POA: Diagnosis not present

## 2021-05-31 DIAGNOSIS — N186 End stage renal disease: Secondary | ICD-10-CM | POA: Diagnosis not present

## 2021-05-31 DIAGNOSIS — D631 Anemia in chronic kidney disease: Secondary | ICD-10-CM | POA: Diagnosis not present

## 2021-06-01 DIAGNOSIS — N186 End stage renal disease: Secondary | ICD-10-CM | POA: Diagnosis not present

## 2021-06-01 DIAGNOSIS — D509 Iron deficiency anemia, unspecified: Secondary | ICD-10-CM | POA: Diagnosis not present

## 2021-06-01 DIAGNOSIS — Z992 Dependence on renal dialysis: Secondary | ICD-10-CM | POA: Diagnosis not present

## 2021-06-01 DIAGNOSIS — Z792 Long term (current) use of antibiotics: Secondary | ICD-10-CM | POA: Diagnosis not present

## 2021-06-01 DIAGNOSIS — Z23 Encounter for immunization: Secondary | ICD-10-CM | POA: Diagnosis not present

## 2021-06-01 DIAGNOSIS — D631 Anemia in chronic kidney disease: Secondary | ICD-10-CM | POA: Diagnosis not present

## 2021-06-02 DIAGNOSIS — Z792 Long term (current) use of antibiotics: Secondary | ICD-10-CM | POA: Diagnosis not present

## 2021-06-02 DIAGNOSIS — N186 End stage renal disease: Secondary | ICD-10-CM | POA: Diagnosis not present

## 2021-06-02 DIAGNOSIS — Z23 Encounter for immunization: Secondary | ICD-10-CM | POA: Diagnosis not present

## 2021-06-02 DIAGNOSIS — Z992 Dependence on renal dialysis: Secondary | ICD-10-CM | POA: Diagnosis not present

## 2021-06-02 DIAGNOSIS — D631 Anemia in chronic kidney disease: Secondary | ICD-10-CM | POA: Diagnosis not present

## 2021-06-02 DIAGNOSIS — D509 Iron deficiency anemia, unspecified: Secondary | ICD-10-CM | POA: Diagnosis not present

## 2021-06-03 DIAGNOSIS — D509 Iron deficiency anemia, unspecified: Secondary | ICD-10-CM | POA: Diagnosis not present

## 2021-06-03 DIAGNOSIS — D631 Anemia in chronic kidney disease: Secondary | ICD-10-CM | POA: Diagnosis not present

## 2021-06-03 DIAGNOSIS — Z23 Encounter for immunization: Secondary | ICD-10-CM | POA: Diagnosis not present

## 2021-06-03 DIAGNOSIS — Z992 Dependence on renal dialysis: Secondary | ICD-10-CM | POA: Diagnosis not present

## 2021-06-03 DIAGNOSIS — Z792 Long term (current) use of antibiotics: Secondary | ICD-10-CM | POA: Diagnosis not present

## 2021-06-03 DIAGNOSIS — N186 End stage renal disease: Secondary | ICD-10-CM | POA: Diagnosis not present

## 2021-06-04 DIAGNOSIS — D631 Anemia in chronic kidney disease: Secondary | ICD-10-CM | POA: Diagnosis not present

## 2021-06-04 DIAGNOSIS — Z992 Dependence on renal dialysis: Secondary | ICD-10-CM | POA: Diagnosis not present

## 2021-06-04 DIAGNOSIS — Z23 Encounter for immunization: Secondary | ICD-10-CM | POA: Diagnosis not present

## 2021-06-04 DIAGNOSIS — N186 End stage renal disease: Secondary | ICD-10-CM | POA: Diagnosis not present

## 2021-06-04 DIAGNOSIS — D509 Iron deficiency anemia, unspecified: Secondary | ICD-10-CM | POA: Diagnosis not present

## 2021-06-04 DIAGNOSIS — Z792 Long term (current) use of antibiotics: Secondary | ICD-10-CM | POA: Diagnosis not present

## 2021-06-05 DIAGNOSIS — Z992 Dependence on renal dialysis: Secondary | ICD-10-CM | POA: Diagnosis not present

## 2021-06-05 DIAGNOSIS — N186 End stage renal disease: Secondary | ICD-10-CM | POA: Diagnosis not present

## 2021-06-05 DIAGNOSIS — D631 Anemia in chronic kidney disease: Secondary | ICD-10-CM | POA: Diagnosis not present

## 2021-06-05 DIAGNOSIS — D509 Iron deficiency anemia, unspecified: Secondary | ICD-10-CM | POA: Diagnosis not present

## 2021-06-05 DIAGNOSIS — Z23 Encounter for immunization: Secondary | ICD-10-CM | POA: Diagnosis not present

## 2021-06-05 DIAGNOSIS — Z792 Long term (current) use of antibiotics: Secondary | ICD-10-CM | POA: Diagnosis not present

## 2021-06-06 DIAGNOSIS — Z992 Dependence on renal dialysis: Secondary | ICD-10-CM | POA: Diagnosis not present

## 2021-06-06 DIAGNOSIS — Z23 Encounter for immunization: Secondary | ICD-10-CM | POA: Diagnosis not present

## 2021-06-06 DIAGNOSIS — D631 Anemia in chronic kidney disease: Secondary | ICD-10-CM | POA: Diagnosis not present

## 2021-06-06 DIAGNOSIS — Z792 Long term (current) use of antibiotics: Secondary | ICD-10-CM | POA: Diagnosis not present

## 2021-06-06 DIAGNOSIS — N186 End stage renal disease: Secondary | ICD-10-CM | POA: Diagnosis not present

## 2021-06-06 DIAGNOSIS — D509 Iron deficiency anemia, unspecified: Secondary | ICD-10-CM | POA: Diagnosis not present

## 2021-06-07 DIAGNOSIS — I2510 Atherosclerotic heart disease of native coronary artery without angina pectoris: Secondary | ICD-10-CM | POA: Diagnosis not present

## 2021-06-07 DIAGNOSIS — Z792 Long term (current) use of antibiotics: Secondary | ICD-10-CM | POA: Diagnosis not present

## 2021-06-07 DIAGNOSIS — Z23 Encounter for immunization: Secondary | ICD-10-CM | POA: Diagnosis not present

## 2021-06-07 DIAGNOSIS — N186 End stage renal disease: Secondary | ICD-10-CM | POA: Diagnosis not present

## 2021-06-07 DIAGNOSIS — Z20828 Contact with and (suspected) exposure to other viral communicable diseases: Secondary | ICD-10-CM | POA: Diagnosis not present

## 2021-06-07 DIAGNOSIS — K652 Spontaneous bacterial peritonitis: Secondary | ICD-10-CM | POA: Diagnosis not present

## 2021-06-07 DIAGNOSIS — D509 Iron deficiency anemia, unspecified: Secondary | ICD-10-CM | POA: Diagnosis not present

## 2021-06-07 DIAGNOSIS — I120 Hypertensive chronic kidney disease with stage 5 chronic kidney disease or end stage renal disease: Secondary | ICD-10-CM | POA: Diagnosis not present

## 2021-06-07 DIAGNOSIS — I4819 Other persistent atrial fibrillation: Secondary | ICD-10-CM | POA: Diagnosis not present

## 2021-06-07 DIAGNOSIS — A411 Sepsis due to other specified staphylococcus: Secondary | ICD-10-CM | POA: Diagnosis not present

## 2021-06-07 DIAGNOSIS — D631 Anemia in chronic kidney disease: Secondary | ICD-10-CM | POA: Diagnosis not present

## 2021-06-07 DIAGNOSIS — Z992 Dependence on renal dialysis: Secondary | ICD-10-CM | POA: Diagnosis not present

## 2021-06-08 ENCOUNTER — Encounter (HOSPITAL_COMMUNITY): Payer: Self-pay | Admitting: Radiology

## 2021-06-08 DIAGNOSIS — D631 Anemia in chronic kidney disease: Secondary | ICD-10-CM | POA: Diagnosis not present

## 2021-06-08 DIAGNOSIS — M549 Dorsalgia, unspecified: Secondary | ICD-10-CM | POA: Diagnosis not present

## 2021-06-08 DIAGNOSIS — A411 Sepsis due to other specified staphylococcus: Secondary | ICD-10-CM | POA: Diagnosis not present

## 2021-06-08 DIAGNOSIS — N186 End stage renal disease: Secondary | ICD-10-CM | POA: Diagnosis not present

## 2021-06-08 DIAGNOSIS — J9811 Atelectasis: Secondary | ICD-10-CM | POA: Diagnosis not present

## 2021-06-08 DIAGNOSIS — N184 Chronic kidney disease, stage 4 (severe): Secondary | ICD-10-CM | POA: Diagnosis not present

## 2021-06-08 DIAGNOSIS — I739 Peripheral vascular disease, unspecified: Secondary | ICD-10-CM | POA: Diagnosis not present

## 2021-06-08 DIAGNOSIS — I129 Hypertensive chronic kidney disease with stage 1 through stage 4 chronic kidney disease, or unspecified chronic kidney disease: Secondary | ICD-10-CM | POA: Diagnosis not present

## 2021-06-08 DIAGNOSIS — I700 Atherosclerosis of aorta: Secondary | ICD-10-CM | POA: Diagnosis not present

## 2021-06-08 DIAGNOSIS — Z23 Encounter for immunization: Secondary | ICD-10-CM | POA: Diagnosis not present

## 2021-06-08 DIAGNOSIS — I10 Essential (primary) hypertension: Secondary | ICD-10-CM | POA: Diagnosis not present

## 2021-06-08 DIAGNOSIS — K658 Other peritonitis: Secondary | ICD-10-CM | POA: Diagnosis not present

## 2021-06-08 DIAGNOSIS — R0602 Shortness of breath: Secondary | ICD-10-CM | POA: Diagnosis not present

## 2021-06-08 DIAGNOSIS — I2510 Atherosclerotic heart disease of native coronary artery without angina pectoris: Secondary | ICD-10-CM | POA: Diagnosis not present

## 2021-06-08 DIAGNOSIS — I480 Paroxysmal atrial fibrillation: Secondary | ICD-10-CM | POA: Diagnosis not present

## 2021-06-08 DIAGNOSIS — K652 Spontaneous bacterial peritonitis: Secondary | ICD-10-CM | POA: Diagnosis not present

## 2021-06-08 DIAGNOSIS — J811 Chronic pulmonary edema: Secondary | ICD-10-CM | POA: Diagnosis not present

## 2021-06-08 DIAGNOSIS — I4891 Unspecified atrial fibrillation: Secondary | ICD-10-CM | POA: Diagnosis not present

## 2021-06-08 DIAGNOSIS — N3289 Other specified disorders of bladder: Secondary | ICD-10-CM | POA: Diagnosis not present

## 2021-06-08 DIAGNOSIS — N261 Atrophy of kidney (terminal): Secondary | ICD-10-CM | POA: Diagnosis not present

## 2021-06-08 DIAGNOSIS — R000 Tachycardia, unspecified: Secondary | ICD-10-CM | POA: Diagnosis not present

## 2021-06-08 DIAGNOSIS — Z7901 Long term (current) use of anticoagulants: Secondary | ICD-10-CM | POA: Diagnosis not present

## 2021-06-08 DIAGNOSIS — N281 Cyst of kidney, acquired: Secondary | ICD-10-CM | POA: Diagnosis not present

## 2021-06-08 DIAGNOSIS — D509 Iron deficiency anemia, unspecified: Secondary | ICD-10-CM | POA: Diagnosis not present

## 2021-06-08 DIAGNOSIS — E871 Hypo-osmolality and hyponatremia: Secondary | ICD-10-CM | POA: Diagnosis not present

## 2021-06-08 DIAGNOSIS — Z4901 Encounter for fitting and adjustment of extracorporeal dialysis catheter: Secondary | ICD-10-CM | POA: Diagnosis not present

## 2021-06-08 DIAGNOSIS — R9431 Abnormal electrocardiogram [ECG] [EKG]: Secondary | ICD-10-CM | POA: Diagnosis not present

## 2021-06-08 DIAGNOSIS — T8571XD Infection and inflammatory reaction due to peritoneal dialysis catheter, subsequent encounter: Secondary | ICD-10-CM | POA: Diagnosis not present

## 2021-06-08 DIAGNOSIS — Z992 Dependence on renal dialysis: Secondary | ICD-10-CM | POA: Diagnosis not present

## 2021-06-08 DIAGNOSIS — J90 Pleural effusion, not elsewhere classified: Secondary | ICD-10-CM | POA: Diagnosis not present

## 2021-06-08 DIAGNOSIS — T8571XA Infection and inflammatory reaction due to peritoneal dialysis catheter, initial encounter: Secondary | ICD-10-CM | POA: Diagnosis not present

## 2021-06-08 DIAGNOSIS — K659 Peritonitis, unspecified: Secondary | ICD-10-CM | POA: Diagnosis not present

## 2021-06-08 DIAGNOSIS — I1310 Hypertensive heart and chronic kidney disease without heart failure, with stage 1 through stage 4 chronic kidney disease, or unspecified chronic kidney disease: Secondary | ICD-10-CM | POA: Diagnosis not present

## 2021-06-08 DIAGNOSIS — N2889 Other specified disorders of kidney and ureter: Secondary | ICD-10-CM | POA: Diagnosis not present

## 2021-06-08 DIAGNOSIS — I517 Cardiomegaly: Secondary | ICD-10-CM | POA: Diagnosis not present

## 2021-06-08 DIAGNOSIS — Z79899 Other long term (current) drug therapy: Secondary | ICD-10-CM | POA: Diagnosis not present

## 2021-06-08 DIAGNOSIS — K5900 Constipation, unspecified: Secondary | ICD-10-CM | POA: Diagnosis not present

## 2021-06-08 DIAGNOSIS — Z792 Long term (current) use of antibiotics: Secondary | ICD-10-CM | POA: Diagnosis not present

## 2021-06-08 DIAGNOSIS — D72829 Elevated white blood cell count, unspecified: Secondary | ICD-10-CM | POA: Diagnosis not present

## 2021-06-08 DIAGNOSIS — A419 Sepsis, unspecified organism: Secondary | ICD-10-CM | POA: Diagnosis not present

## 2021-06-08 DIAGNOSIS — Z87891 Personal history of nicotine dependence: Secondary | ICD-10-CM | POA: Diagnosis not present

## 2021-06-08 DIAGNOSIS — I4819 Other persistent atrial fibrillation: Secondary | ICD-10-CM | POA: Diagnosis not present

## 2021-06-08 DIAGNOSIS — I351 Nonrheumatic aortic (valve) insufficiency: Secondary | ICD-10-CM | POA: Diagnosis not present

## 2021-06-08 DIAGNOSIS — I120 Hypertensive chronic kidney disease with stage 5 chronic kidney disease or end stage renal disease: Secondary | ICD-10-CM | POA: Diagnosis not present

## 2021-06-15 DIAGNOSIS — E871 Hypo-osmolality and hyponatremia: Secondary | ICD-10-CM | POA: Diagnosis not present

## 2021-06-15 DIAGNOSIS — Z87891 Personal history of nicotine dependence: Secondary | ICD-10-CM | POA: Diagnosis not present

## 2021-06-15 DIAGNOSIS — Z7901 Long term (current) use of anticoagulants: Secondary | ICD-10-CM | POA: Diagnosis not present

## 2021-06-15 DIAGNOSIS — K652 Spontaneous bacterial peritonitis: Secondary | ICD-10-CM | POA: Diagnosis not present

## 2021-06-15 DIAGNOSIS — I120 Hypertensive chronic kidney disease with stage 5 chronic kidney disease or end stage renal disease: Secondary | ICD-10-CM | POA: Diagnosis not present

## 2021-06-15 DIAGNOSIS — I2510 Atherosclerotic heart disease of native coronary artery without angina pectoris: Secondary | ICD-10-CM | POA: Diagnosis not present

## 2021-06-15 DIAGNOSIS — Z992 Dependence on renal dialysis: Secondary | ICD-10-CM | POA: Diagnosis not present

## 2021-06-15 DIAGNOSIS — D631 Anemia in chronic kidney disease: Secondary | ICD-10-CM | POA: Diagnosis not present

## 2021-06-15 DIAGNOSIS — N186 End stage renal disease: Secondary | ICD-10-CM | POA: Diagnosis not present

## 2021-06-15 DIAGNOSIS — A411 Sepsis due to other specified staphylococcus: Secondary | ICD-10-CM | POA: Diagnosis not present

## 2021-06-15 DIAGNOSIS — I739 Peripheral vascular disease, unspecified: Secondary | ICD-10-CM | POA: Diagnosis not present

## 2021-06-15 DIAGNOSIS — I351 Nonrheumatic aortic (valve) insufficiency: Secondary | ICD-10-CM | POA: Diagnosis not present

## 2021-06-15 DIAGNOSIS — I4819 Other persistent atrial fibrillation: Secondary | ICD-10-CM | POA: Diagnosis not present

## 2021-06-16 DIAGNOSIS — N186 End stage renal disease: Secondary | ICD-10-CM | POA: Diagnosis not present

## 2021-06-16 DIAGNOSIS — Z992 Dependence on renal dialysis: Secondary | ICD-10-CM | POA: Diagnosis not present

## 2021-06-16 DIAGNOSIS — D631 Anemia in chronic kidney disease: Secondary | ICD-10-CM | POA: Diagnosis not present

## 2021-06-16 DIAGNOSIS — D509 Iron deficiency anemia, unspecified: Secondary | ICD-10-CM | POA: Diagnosis not present

## 2021-06-16 DIAGNOSIS — Z792 Long term (current) use of antibiotics: Secondary | ICD-10-CM | POA: Diagnosis not present

## 2021-06-16 DIAGNOSIS — Z23 Encounter for immunization: Secondary | ICD-10-CM | POA: Diagnosis not present

## 2021-06-21 DIAGNOSIS — I4819 Other persistent atrial fibrillation: Secondary | ICD-10-CM | POA: Diagnosis not present

## 2021-06-21 DIAGNOSIS — K652 Spontaneous bacterial peritonitis: Secondary | ICD-10-CM | POA: Diagnosis not present

## 2021-06-21 DIAGNOSIS — I2510 Atherosclerotic heart disease of native coronary artery without angina pectoris: Secondary | ICD-10-CM | POA: Diagnosis not present

## 2021-06-21 DIAGNOSIS — A411 Sepsis due to other specified staphylococcus: Secondary | ICD-10-CM | POA: Diagnosis not present

## 2021-06-21 DIAGNOSIS — Z298 Encounter for other specified prophylactic measures: Secondary | ICD-10-CM | POA: Diagnosis not present

## 2021-06-21 DIAGNOSIS — Z992 Dependence on renal dialysis: Secondary | ICD-10-CM | POA: Diagnosis not present

## 2021-06-21 DIAGNOSIS — T8571XA Infection and inflammatory reaction due to peritoneal dialysis catheter, initial encounter: Secondary | ICD-10-CM | POA: Diagnosis not present

## 2021-06-21 DIAGNOSIS — N186 End stage renal disease: Secondary | ICD-10-CM | POA: Diagnosis not present

## 2021-06-21 DIAGNOSIS — D509 Iron deficiency anemia, unspecified: Secondary | ICD-10-CM | POA: Diagnosis not present

## 2021-06-21 DIAGNOSIS — N2581 Secondary hyperparathyroidism of renal origin: Secondary | ICD-10-CM | POA: Diagnosis not present

## 2021-06-21 DIAGNOSIS — D631 Anemia in chronic kidney disease: Secondary | ICD-10-CM | POA: Diagnosis not present

## 2021-06-21 DIAGNOSIS — I120 Hypertensive chronic kidney disease with stage 5 chronic kidney disease or end stage renal disease: Secondary | ICD-10-CM | POA: Diagnosis not present

## 2021-06-23 DIAGNOSIS — Z298 Encounter for other specified prophylactic measures: Secondary | ICD-10-CM | POA: Diagnosis not present

## 2021-06-23 DIAGNOSIS — N186 End stage renal disease: Secondary | ICD-10-CM | POA: Diagnosis not present

## 2021-06-23 DIAGNOSIS — Z992 Dependence on renal dialysis: Secondary | ICD-10-CM | POA: Diagnosis not present

## 2021-06-23 DIAGNOSIS — T8571XA Infection and inflammatory reaction due to peritoneal dialysis catheter, initial encounter: Secondary | ICD-10-CM | POA: Diagnosis not present

## 2021-06-23 DIAGNOSIS — D509 Iron deficiency anemia, unspecified: Secondary | ICD-10-CM | POA: Diagnosis not present

## 2021-06-23 DIAGNOSIS — D631 Anemia in chronic kidney disease: Secondary | ICD-10-CM | POA: Diagnosis not present

## 2021-06-26 DIAGNOSIS — D509 Iron deficiency anemia, unspecified: Secondary | ICD-10-CM | POA: Diagnosis not present

## 2021-06-26 DIAGNOSIS — Z992 Dependence on renal dialysis: Secondary | ICD-10-CM | POA: Diagnosis not present

## 2021-06-26 DIAGNOSIS — Z298 Encounter for other specified prophylactic measures: Secondary | ICD-10-CM | POA: Diagnosis not present

## 2021-06-26 DIAGNOSIS — N186 End stage renal disease: Secondary | ICD-10-CM | POA: Diagnosis not present

## 2021-06-26 DIAGNOSIS — T8571XA Infection and inflammatory reaction due to peritoneal dialysis catheter, initial encounter: Secondary | ICD-10-CM | POA: Diagnosis not present

## 2021-06-26 DIAGNOSIS — D631 Anemia in chronic kidney disease: Secondary | ICD-10-CM | POA: Diagnosis not present

## 2021-06-27 DIAGNOSIS — K652 Spontaneous bacterial peritonitis: Secondary | ICD-10-CM | POA: Diagnosis not present

## 2021-06-27 DIAGNOSIS — I4819 Other persistent atrial fibrillation: Secondary | ICD-10-CM | POA: Diagnosis not present

## 2021-06-27 DIAGNOSIS — A411 Sepsis due to other specified staphylococcus: Secondary | ICD-10-CM | POA: Diagnosis not present

## 2021-06-27 DIAGNOSIS — I2510 Atherosclerotic heart disease of native coronary artery without angina pectoris: Secondary | ICD-10-CM | POA: Diagnosis not present

## 2021-06-27 DIAGNOSIS — I120 Hypertensive chronic kidney disease with stage 5 chronic kidney disease or end stage renal disease: Secondary | ICD-10-CM | POA: Diagnosis not present

## 2021-06-27 DIAGNOSIS — N186 End stage renal disease: Secondary | ICD-10-CM | POA: Diagnosis not present

## 2021-06-28 DIAGNOSIS — D509 Iron deficiency anemia, unspecified: Secondary | ICD-10-CM | POA: Diagnosis not present

## 2021-06-28 DIAGNOSIS — N186 End stage renal disease: Secondary | ICD-10-CM | POA: Diagnosis not present

## 2021-06-28 DIAGNOSIS — Z298 Encounter for other specified prophylactic measures: Secondary | ICD-10-CM | POA: Diagnosis not present

## 2021-06-28 DIAGNOSIS — T8571XA Infection and inflammatory reaction due to peritoneal dialysis catheter, initial encounter: Secondary | ICD-10-CM | POA: Diagnosis not present

## 2021-06-28 DIAGNOSIS — D631 Anemia in chronic kidney disease: Secondary | ICD-10-CM | POA: Diagnosis not present

## 2021-06-28 DIAGNOSIS — Z992 Dependence on renal dialysis: Secondary | ICD-10-CM | POA: Diagnosis not present

## 2021-06-30 DIAGNOSIS — Z298 Encounter for other specified prophylactic measures: Secondary | ICD-10-CM | POA: Diagnosis not present

## 2021-06-30 DIAGNOSIS — Z992 Dependence on renal dialysis: Secondary | ICD-10-CM | POA: Diagnosis not present

## 2021-06-30 DIAGNOSIS — D631 Anemia in chronic kidney disease: Secondary | ICD-10-CM | POA: Diagnosis not present

## 2021-06-30 DIAGNOSIS — D509 Iron deficiency anemia, unspecified: Secondary | ICD-10-CM | POA: Diagnosis not present

## 2021-06-30 DIAGNOSIS — N186 End stage renal disease: Secondary | ICD-10-CM | POA: Diagnosis not present

## 2021-06-30 DIAGNOSIS — T8571XA Infection and inflammatory reaction due to peritoneal dialysis catheter, initial encounter: Secondary | ICD-10-CM | POA: Diagnosis not present

## 2021-07-02 DIAGNOSIS — M818 Other osteoporosis without current pathological fracture: Secondary | ICD-10-CM | POA: Diagnosis not present

## 2021-07-02 DIAGNOSIS — A4189 Other specified sepsis: Secondary | ICD-10-CM | POA: Diagnosis not present

## 2021-07-02 DIAGNOSIS — I10 Essential (primary) hypertension: Secondary | ICD-10-CM | POA: Diagnosis not present

## 2021-07-02 DIAGNOSIS — N178 Other acute kidney failure: Secondary | ICD-10-CM | POA: Diagnosis not present

## 2021-07-02 DIAGNOSIS — I480 Paroxysmal atrial fibrillation: Secondary | ICD-10-CM | POA: Diagnosis not present

## 2021-07-03 DIAGNOSIS — T8571XA Infection and inflammatory reaction due to peritoneal dialysis catheter, initial encounter: Secondary | ICD-10-CM | POA: Diagnosis not present

## 2021-07-03 DIAGNOSIS — D631 Anemia in chronic kidney disease: Secondary | ICD-10-CM | POA: Diagnosis not present

## 2021-07-03 DIAGNOSIS — Z298 Encounter for other specified prophylactic measures: Secondary | ICD-10-CM | POA: Diagnosis not present

## 2021-07-03 DIAGNOSIS — D509 Iron deficiency anemia, unspecified: Secondary | ICD-10-CM | POA: Diagnosis not present

## 2021-07-03 DIAGNOSIS — N186 End stage renal disease: Secondary | ICD-10-CM | POA: Diagnosis not present

## 2021-07-03 DIAGNOSIS — Z992 Dependence on renal dialysis: Secondary | ICD-10-CM | POA: Diagnosis not present

## 2021-07-07 DIAGNOSIS — Z992 Dependence on renal dialysis: Secondary | ICD-10-CM | POA: Diagnosis not present

## 2021-07-07 DIAGNOSIS — T8571XA Infection and inflammatory reaction due to peritoneal dialysis catheter, initial encounter: Secondary | ICD-10-CM | POA: Diagnosis not present

## 2021-07-07 DIAGNOSIS — Z298 Encounter for other specified prophylactic measures: Secondary | ICD-10-CM | POA: Diagnosis not present

## 2021-07-07 DIAGNOSIS — N186 End stage renal disease: Secondary | ICD-10-CM | POA: Diagnosis not present

## 2021-07-07 DIAGNOSIS — D509 Iron deficiency anemia, unspecified: Secondary | ICD-10-CM | POA: Diagnosis not present

## 2021-07-07 DIAGNOSIS — D631 Anemia in chronic kidney disease: Secondary | ICD-10-CM | POA: Diagnosis not present

## 2021-07-10 DIAGNOSIS — Z992 Dependence on renal dialysis: Secondary | ICD-10-CM | POA: Diagnosis not present

## 2021-07-10 DIAGNOSIS — N186 End stage renal disease: Secondary | ICD-10-CM | POA: Diagnosis not present

## 2021-07-10 DIAGNOSIS — Z298 Encounter for other specified prophylactic measures: Secondary | ICD-10-CM | POA: Diagnosis not present

## 2021-07-10 DIAGNOSIS — T8571XA Infection and inflammatory reaction due to peritoneal dialysis catheter, initial encounter: Secondary | ICD-10-CM | POA: Diagnosis not present

## 2021-07-10 DIAGNOSIS — D509 Iron deficiency anemia, unspecified: Secondary | ICD-10-CM | POA: Diagnosis not present

## 2021-07-10 DIAGNOSIS — D631 Anemia in chronic kidney disease: Secondary | ICD-10-CM | POA: Diagnosis not present

## 2021-07-12 DIAGNOSIS — D631 Anemia in chronic kidney disease: Secondary | ICD-10-CM | POA: Diagnosis not present

## 2021-07-12 DIAGNOSIS — T8571XA Infection and inflammatory reaction due to peritoneal dialysis catheter, initial encounter: Secondary | ICD-10-CM | POA: Diagnosis not present

## 2021-07-12 DIAGNOSIS — N186 End stage renal disease: Secondary | ICD-10-CM | POA: Diagnosis not present

## 2021-07-12 DIAGNOSIS — Z298 Encounter for other specified prophylactic measures: Secondary | ICD-10-CM | POA: Diagnosis not present

## 2021-07-12 DIAGNOSIS — Z992 Dependence on renal dialysis: Secondary | ICD-10-CM | POA: Diagnosis not present

## 2021-07-12 DIAGNOSIS — D509 Iron deficiency anemia, unspecified: Secondary | ICD-10-CM | POA: Diagnosis not present

## 2021-07-16 DIAGNOSIS — T8571XA Infection and inflammatory reaction due to peritoneal dialysis catheter, initial encounter: Secondary | ICD-10-CM | POA: Diagnosis not present

## 2021-07-16 DIAGNOSIS — N186 End stage renal disease: Secondary | ICD-10-CM | POA: Diagnosis not present

## 2021-07-16 DIAGNOSIS — Z298 Encounter for other specified prophylactic measures: Secondary | ICD-10-CM | POA: Diagnosis not present

## 2021-07-16 DIAGNOSIS — D631 Anemia in chronic kidney disease: Secondary | ICD-10-CM | POA: Diagnosis not present

## 2021-07-16 DIAGNOSIS — Z992 Dependence on renal dialysis: Secondary | ICD-10-CM | POA: Diagnosis not present

## 2021-07-16 DIAGNOSIS — D509 Iron deficiency anemia, unspecified: Secondary | ICD-10-CM | POA: Diagnosis not present

## 2021-07-17 DIAGNOSIS — Z298 Encounter for other specified prophylactic measures: Secondary | ICD-10-CM | POA: Diagnosis not present

## 2021-07-17 DIAGNOSIS — Z992 Dependence on renal dialysis: Secondary | ICD-10-CM | POA: Diagnosis not present

## 2021-07-17 DIAGNOSIS — N186 End stage renal disease: Secondary | ICD-10-CM | POA: Diagnosis not present

## 2021-07-17 DIAGNOSIS — D631 Anemia in chronic kidney disease: Secondary | ICD-10-CM | POA: Diagnosis not present

## 2021-07-17 DIAGNOSIS — D509 Iron deficiency anemia, unspecified: Secondary | ICD-10-CM | POA: Diagnosis not present

## 2021-07-17 DIAGNOSIS — T8571XA Infection and inflammatory reaction due to peritoneal dialysis catheter, initial encounter: Secondary | ICD-10-CM | POA: Diagnosis not present

## 2021-07-19 DIAGNOSIS — D631 Anemia in chronic kidney disease: Secondary | ICD-10-CM | POA: Diagnosis not present

## 2021-07-19 DIAGNOSIS — N186 End stage renal disease: Secondary | ICD-10-CM | POA: Diagnosis not present

## 2021-07-19 DIAGNOSIS — T8571XA Infection and inflammatory reaction due to peritoneal dialysis catheter, initial encounter: Secondary | ICD-10-CM | POA: Diagnosis not present

## 2021-07-19 DIAGNOSIS — Z992 Dependence on renal dialysis: Secondary | ICD-10-CM | POA: Diagnosis not present

## 2021-07-19 DIAGNOSIS — D509 Iron deficiency anemia, unspecified: Secondary | ICD-10-CM | POA: Diagnosis not present

## 2021-07-19 DIAGNOSIS — Z298 Encounter for other specified prophylactic measures: Secondary | ICD-10-CM | POA: Diagnosis not present

## 2021-07-21 DIAGNOSIS — Z992 Dependence on renal dialysis: Secondary | ICD-10-CM | POA: Diagnosis not present

## 2021-07-21 DIAGNOSIS — D509 Iron deficiency anemia, unspecified: Secondary | ICD-10-CM | POA: Diagnosis not present

## 2021-07-21 DIAGNOSIS — T8571XA Infection and inflammatory reaction due to peritoneal dialysis catheter, initial encounter: Secondary | ICD-10-CM | POA: Diagnosis not present

## 2021-07-21 DIAGNOSIS — Z298 Encounter for other specified prophylactic measures: Secondary | ICD-10-CM | POA: Diagnosis not present

## 2021-07-21 DIAGNOSIS — N186 End stage renal disease: Secondary | ICD-10-CM | POA: Diagnosis not present

## 2021-07-21 DIAGNOSIS — D631 Anemia in chronic kidney disease: Secondary | ICD-10-CM | POA: Diagnosis not present

## 2021-07-22 DIAGNOSIS — D631 Anemia in chronic kidney disease: Secondary | ICD-10-CM | POA: Diagnosis not present

## 2021-07-22 DIAGNOSIS — D509 Iron deficiency anemia, unspecified: Secondary | ICD-10-CM | POA: Diagnosis not present

## 2021-07-22 DIAGNOSIS — N186 End stage renal disease: Secondary | ICD-10-CM | POA: Diagnosis not present

## 2021-07-22 DIAGNOSIS — Z992 Dependence on renal dialysis: Secondary | ICD-10-CM | POA: Diagnosis not present

## 2021-07-22 DIAGNOSIS — N2581 Secondary hyperparathyroidism of renal origin: Secondary | ICD-10-CM | POA: Diagnosis not present

## 2021-07-24 DIAGNOSIS — D631 Anemia in chronic kidney disease: Secondary | ICD-10-CM | POA: Diagnosis not present

## 2021-07-24 DIAGNOSIS — N186 End stage renal disease: Secondary | ICD-10-CM | POA: Diagnosis not present

## 2021-07-24 DIAGNOSIS — Z992 Dependence on renal dialysis: Secondary | ICD-10-CM | POA: Diagnosis not present

## 2021-07-24 DIAGNOSIS — N2581 Secondary hyperparathyroidism of renal origin: Secondary | ICD-10-CM | POA: Diagnosis not present

## 2021-07-24 DIAGNOSIS — D509 Iron deficiency anemia, unspecified: Secondary | ICD-10-CM | POA: Diagnosis not present

## 2021-07-26 DIAGNOSIS — N2581 Secondary hyperparathyroidism of renal origin: Secondary | ICD-10-CM | POA: Diagnosis not present

## 2021-07-26 DIAGNOSIS — N186 End stage renal disease: Secondary | ICD-10-CM | POA: Diagnosis not present

## 2021-07-26 DIAGNOSIS — D631 Anemia in chronic kidney disease: Secondary | ICD-10-CM | POA: Diagnosis not present

## 2021-07-26 DIAGNOSIS — D509 Iron deficiency anemia, unspecified: Secondary | ICD-10-CM | POA: Diagnosis not present

## 2021-07-26 DIAGNOSIS — Z992 Dependence on renal dialysis: Secondary | ICD-10-CM | POA: Diagnosis not present

## 2021-07-28 DIAGNOSIS — N186 End stage renal disease: Secondary | ICD-10-CM | POA: Diagnosis not present

## 2021-07-28 DIAGNOSIS — D509 Iron deficiency anemia, unspecified: Secondary | ICD-10-CM | POA: Diagnosis not present

## 2021-07-28 DIAGNOSIS — N2581 Secondary hyperparathyroidism of renal origin: Secondary | ICD-10-CM | POA: Diagnosis not present

## 2021-07-28 DIAGNOSIS — D631 Anemia in chronic kidney disease: Secondary | ICD-10-CM | POA: Diagnosis not present

## 2021-07-28 DIAGNOSIS — Z992 Dependence on renal dialysis: Secondary | ICD-10-CM | POA: Diagnosis not present

## 2021-08-07 ENCOUNTER — Other Ambulatory Visit: Payer: Self-pay

## 2021-08-07 DIAGNOSIS — N186 End stage renal disease: Secondary | ICD-10-CM

## 2021-08-09 ENCOUNTER — Encounter (HOSPITAL_COMMUNITY): Payer: Self-pay

## 2021-08-09 ENCOUNTER — Other Ambulatory Visit: Payer: Self-pay

## 2021-08-09 ENCOUNTER — Inpatient Hospital Stay (HOSPITAL_COMMUNITY)
Admission: EM | Admit: 2021-08-09 | Discharge: 2021-08-12 | DRG: 308 | Disposition: A | Discharge status: Home / Self Care | Payer: Medicare Other | Attending: Internal Medicine | Admitting: Internal Medicine

## 2021-08-09 ENCOUNTER — Inpatient Hospital Stay (HOSPITAL_COMMUNITY): Payer: Medicare Other

## 2021-08-09 ENCOUNTER — Emergency Department (HOSPITAL_COMMUNITY): Payer: Medicare Other

## 2021-08-09 DIAGNOSIS — N186 End stage renal disease: Secondary | ICD-10-CM | POA: Diagnosis present

## 2021-08-09 DIAGNOSIS — I499 Cardiac arrhythmia, unspecified: Secondary | ICD-10-CM | POA: Diagnosis not present

## 2021-08-09 DIAGNOSIS — I4891 Unspecified atrial fibrillation: Secondary | ICD-10-CM | POA: Diagnosis present

## 2021-08-09 DIAGNOSIS — N250 Renal osteodystrophy: Secondary | ICD-10-CM | POA: Diagnosis not present

## 2021-08-09 DIAGNOSIS — Z992 Dependence on renal dialysis: Secondary | ICD-10-CM | POA: Diagnosis not present

## 2021-08-09 DIAGNOSIS — I739 Peripheral vascular disease, unspecified: Secondary | ICD-10-CM | POA: Diagnosis present

## 2021-08-09 DIAGNOSIS — I132 Hypertensive heart and chronic kidney disease with heart failure and with stage 5 chronic kidney disease, or end stage renal disease: Secondary | ICD-10-CM | POA: Diagnosis present

## 2021-08-09 DIAGNOSIS — K219 Gastro-esophageal reflux disease without esophagitis: Secondary | ICD-10-CM

## 2021-08-09 DIAGNOSIS — Z20822 Contact with and (suspected) exposure to covid-19: Secondary | ICD-10-CM | POA: Diagnosis present

## 2021-08-09 DIAGNOSIS — I120 Hypertensive chronic kidney disease with stage 5 chronic kidney disease or end stage renal disease: Secondary | ICD-10-CM | POA: Diagnosis not present

## 2021-08-09 DIAGNOSIS — D631 Anemia in chronic kidney disease: Secondary | ICD-10-CM | POA: Diagnosis present

## 2021-08-09 DIAGNOSIS — Z79899 Other long term (current) drug therapy: Secondary | ICD-10-CM

## 2021-08-09 DIAGNOSIS — R0689 Other abnormalities of breathing: Secondary | ICD-10-CM | POA: Diagnosis not present

## 2021-08-09 DIAGNOSIS — R5381 Other malaise: Secondary | ICD-10-CM | POA: Diagnosis not present

## 2021-08-09 DIAGNOSIS — Z87891 Personal history of nicotine dependence: Secondary | ICD-10-CM | POA: Diagnosis not present

## 2021-08-09 DIAGNOSIS — I2510 Atherosclerotic heart disease of native coronary artery without angina pectoris: Secondary | ICD-10-CM | POA: Diagnosis present

## 2021-08-09 DIAGNOSIS — Z8249 Family history of ischemic heart disease and other diseases of the circulatory system: Secondary | ICD-10-CM | POA: Diagnosis not present

## 2021-08-09 DIAGNOSIS — D649 Anemia, unspecified: Secondary | ICD-10-CM | POA: Diagnosis not present

## 2021-08-09 DIAGNOSIS — J9811 Atelectasis: Secondary | ICD-10-CM | POA: Diagnosis not present

## 2021-08-09 DIAGNOSIS — I5021 Acute systolic (congestive) heart failure: Secondary | ICD-10-CM

## 2021-08-09 DIAGNOSIS — Z9115 Patient's noncompliance with renal dialysis: Secondary | ICD-10-CM | POA: Diagnosis not present

## 2021-08-09 DIAGNOSIS — R000 Tachycardia, unspecified: Secondary | ICD-10-CM | POA: Diagnosis not present

## 2021-08-09 DIAGNOSIS — R531 Weakness: Secondary | ICD-10-CM | POA: Diagnosis not present

## 2021-08-09 DIAGNOSIS — I4892 Unspecified atrial flutter: Secondary | ICD-10-CM | POA: Diagnosis present

## 2021-08-09 DIAGNOSIS — Z7901 Long term (current) use of anticoagulants: Secondary | ICD-10-CM | POA: Diagnosis not present

## 2021-08-09 DIAGNOSIS — M898X9 Other specified disorders of bone, unspecified site: Secondary | ICD-10-CM | POA: Diagnosis present

## 2021-08-09 LAB — ECHOCARDIOGRAM COMPLETE
AR max vel: 2.13 cm2
AV Area VTI: 2.03 cm2
AV Area mean vel: 2.16 cm2
AV Mean grad: 3.7 mmHg
AV Peak grad: 7.5 mmHg
Ao pk vel: 1.37 m/s
Area-P 1/2: 3.72 cm2
Height: 72 in
MV M vel: 4.8 m/s
MV Peak grad: 92.2 mmHg
P 1/2 time: 588 msec
Radius: 0.6 cm
S' Lateral: 4.55 cm
Weight: 2716.07 oz

## 2021-08-09 LAB — PREPARE RBC (CROSSMATCH)

## 2021-08-09 LAB — COMPREHENSIVE METABOLIC PANEL
ALT: 17 U/L (ref 0–44)
AST: 22 U/L (ref 15–41)
Albumin: 2.2 g/dL — ABNORMAL LOW (ref 3.5–5.0)
Alkaline Phosphatase: 88 U/L (ref 38–126)
Anion gap: 15 (ref 5–15)
BUN: 46 mg/dL — ABNORMAL HIGH (ref 8–23)
CO2: 27 mmol/L (ref 22–32)
Calcium: 9.2 mg/dL (ref 8.9–10.3)
Chloride: 96 mmol/L — ABNORMAL LOW (ref 98–111)
Creatinine, Ser: 14.19 mg/dL — ABNORMAL HIGH (ref 0.61–1.24)
GFR, Estimated: 3 mL/min — ABNORMAL LOW (ref >60)
Glucose, Bld: 149 mg/dL — ABNORMAL HIGH (ref 70–99)
Potassium: 4 mmol/L (ref 3.5–5.1)
Sodium: 138 mmol/L (ref 135–145)
Total Bilirubin: 2.6 mg/dL — ABNORMAL HIGH (ref 0.3–1.2)
Total Protein: 6.8 g/dL (ref 6.5–8.1)

## 2021-08-09 LAB — MRSA NEXT GEN BY PCR, NASAL: MRSA by PCR Next Gen: NOT DETECTED

## 2021-08-09 LAB — CBC
HCT: 23 % — ABNORMAL LOW (ref 39.0–52.0)
Hemoglobin: 6.9 g/dL — CL (ref 13.0–17.0)
MCH: 31.9 pg (ref 26.0–34.0)
MCHC: 30 g/dL (ref 30.0–36.0)
MCV: 106.5 fL — ABNORMAL HIGH (ref 80.0–100.0)
Platelets: 346 10*3/uL (ref 150–400)
RBC: 2.16 MIL/uL — ABNORMAL LOW (ref 4.22–5.81)
RDW: 16.9 % — ABNORMAL HIGH (ref 11.5–15.5)
WBC: 8.1 10*3/uL (ref 4.0–10.5)
nRBC: 0 % (ref 0.0–0.2)

## 2021-08-09 LAB — HEMOGLOBIN AND HEMATOCRIT, BLOOD
HCT: 31.4 % — ABNORMAL LOW (ref 39.0–52.0)
Hemoglobin: 10.7 g/dL — ABNORMAL LOW (ref 13.0–17.0)

## 2021-08-09 LAB — TROPONIN I (HIGH SENSITIVITY)
Troponin I (High Sensitivity): 21 ng/L — ABNORMAL HIGH (ref <18)
Troponin I (High Sensitivity): 21 ng/L — ABNORMAL HIGH (ref <18)

## 2021-08-09 LAB — LIPASE, BLOOD: Lipase: 24 U/L (ref 11–51)

## 2021-08-09 LAB — HEPATITIS B SURFACE ANTIBODY,QUALITATIVE: Hep B S Ab: REACTIVE — AB

## 2021-08-09 LAB — VITAMIN D 25 HYDROXY (VIT D DEFICIENCY, FRACTURES): Vit D, 25-Hydroxy: 8.24 ng/mL — ABNORMAL LOW (ref 30–100)

## 2021-08-09 LAB — RESP PANEL BY RT-PCR (FLU A&B, COVID) ARPGX2
Influenza A by PCR: NEGATIVE
Influenza B by PCR: NEGATIVE
SARS Coronavirus 2 by RT PCR: NEGATIVE

## 2021-08-09 LAB — TSH: TSH: 1.979 u[IU]/mL (ref 0.350–4.500)

## 2021-08-09 LAB — POC OCCULT BLOOD, ED: Fecal Occult Bld: NEGATIVE

## 2021-08-09 LAB — HEPATITIS B SURFACE ANTIGEN: Hepatitis B Surface Ag: NONREACTIVE

## 2021-08-09 LAB — VITAMIN B12: Vitamin B-12: 1350 pg/mL — ABNORMAL HIGH (ref 180–914)

## 2021-08-09 MED ORDER — DILTIAZEM LOAD VIA INFUSION
15.0000 mg | Freq: Once | INTRAVENOUS | Status: AC
  Administered 2021-08-09: 15 mg via INTRAVENOUS
  Filled 2021-08-09: qty 15

## 2021-08-09 MED ORDER — METOPROLOL TARTRATE 25 MG PO TABS
25.0000 mg | ORAL_TABLET | Freq: Two times a day (BID) | ORAL | Status: DC
  Administered 2021-08-09 – 2021-08-10 (×3): 25 mg via ORAL
  Filled 2021-08-09 (×3): qty 1

## 2021-08-09 MED ORDER — ACETAMINOPHEN 325 MG PO TABS: 650.0000 mg | ORAL_TABLET | Freq: Four times a day (QID) | ORAL | Status: DC | PRN

## 2021-08-09 MED ORDER — CHLORHEXIDINE GLUCONATE CLOTH 2 % EX PADS
6.0000 | MEDICATED_PAD | Freq: Every day | CUTANEOUS | Status: DC
  Administered 2021-08-11: 6 via TOPICAL

## 2021-08-09 MED ORDER — SODIUM CHLORIDE 0.9% IV SOLUTION: Freq: Once | INTRAVENOUS | Status: DC

## 2021-08-09 MED ORDER — DILTIAZEM HCL-DEXTROSE 125-5 MG/125ML-% IV SOLN (PREMIX)
5.0000 mg/h | INTRAVENOUS | Status: DC
  Administered 2021-08-09: 15 mg/h via INTRAVENOUS
  Administered 2021-08-09: 8 mg/h via INTRAVENOUS
  Filled 2021-08-09 (×3): qty 125

## 2021-08-09 MED ORDER — ACETAMINOPHEN 650 MG RE SUPP: 650.0000 mg | Freq: Four times a day (QID) | RECTAL | Status: DC | PRN

## 2021-08-09 MED ORDER — LIDOCAINE-PRILOCAINE 2.5-2.5 % EX CREA: 1.0000 "application " | TOPICAL_CREAM | CUTANEOUS | Status: DC | PRN

## 2021-08-09 MED ORDER — HEPARIN SODIUM (PORCINE) 1000 UNIT/ML DIALYSIS
1000.0000 [IU] | INTRAMUSCULAR | Status: DC | PRN
  Administered 2021-08-11: 3200 [IU] via INTRAVENOUS_CENTRAL

## 2021-08-09 MED ORDER — PANTOPRAZOLE SODIUM 40 MG PO TBEC
40.0000 mg | DELAYED_RELEASE_TABLET | Freq: Every day | ORAL | Status: DC
  Administered 2021-08-09 – 2021-08-12 (×4): 40 mg via ORAL
  Filled 2021-08-09 (×4): qty 1

## 2021-08-09 MED ORDER — SODIUM CHLORIDE 0.9 % IV SOLN: 100.0000 mL | INTRAVENOUS | Status: DC | PRN

## 2021-08-09 MED ORDER — ONDANSETRON HCL 4 MG PO TABS: 4.0000 mg | ORAL_TABLET | Freq: Four times a day (QID) | ORAL | Status: DC | PRN

## 2021-08-09 MED ORDER — ALTEPLASE 2 MG IJ SOLR: 2.0000 mg | Freq: Once | INTRAMUSCULAR | Status: DC | PRN

## 2021-08-09 MED ORDER — ONDANSETRON HCL 4 MG/2ML IJ SOLN: 4.0000 mg | Freq: Four times a day (QID) | INTRAMUSCULAR | Status: DC | PRN

## 2021-08-09 MED ORDER — CHLORHEXIDINE GLUCONATE CLOTH 2 % EX PADS
6.0000 | MEDICATED_PAD | Freq: Every day | CUTANEOUS | Status: DC
  Administered 2021-08-09 – 2021-08-12 (×4): 6 via TOPICAL

## 2021-08-09 MED ORDER — ONDANSETRON HCL 4 MG/2ML IJ SOLN
4.0000 mg | Freq: Once | INTRAMUSCULAR | Status: AC
  Administered 2021-08-09: 4 mg via INTRAVENOUS
  Filled 2021-08-09: qty 2

## 2021-08-09 MED ORDER — APIXABAN 5 MG PO TABS
5.0000 mg | ORAL_TABLET | Freq: Two times a day (BID) | ORAL | Status: DC
  Administered 2021-08-09 – 2021-08-12 (×6): 5 mg via ORAL
  Filled 2021-08-09 (×6): qty 1

## 2021-08-09 NOTE — Consult Note (Signed)
[facility] Renal Consultation Note  Requesting MD: Barton Dubois, MD Indication for Consultation: ESRD  Chief complaint: weakness  HPI:  Rodney Gordon is a 66 y.o. male with a history of ESRD on hemodialysis TTS at [facility],  coronary artery disease, hypertension presented to the ER with fatigue and nausea.  He also had a subjective feeling of heart racing.  Has missed a week of dialysis already (last HD reportedly 07/30/21).  He called EMS.  Per ER charting EMS noted heart rate 140s to 160s and he was given dilt.  He has been admitted to the ICU.  Nephrology is consulted for assistance with management of ESRD.  Note that he was seen by [facility] during an October 2022 hospitalization at another facility.  He was previously on PD and had peritonitis.  He feels better now that he's here.  He is seen and examined on HD.  His UF was decreased earlier and I ultimately turned off as HR jumps to 130's nonsustained.  Catheter has been working well.    PMHx:   Past Medical History:  Diagnosis Date   Anemia of chronic disease    Aortic insufficiency    Mild to moderate   Coronary atherosclerosis of native coronary artery    Nonobstructive at catheterization 2004   ESRD on hemodialysis [facility])    Dr. Lowanda Kyran Connaughton   Essential hypertension    Peripheral vascular disease [facility])     Past Surgical History:  Procedure Laterality Date   A/V FISTULAGRAM Right 09/05/2017   Procedure: A/V FISTULAGRAM;  Surgeon: Angelia Mould, MD;  Location: [place] [facility];  Service: Cardiovascular;  Laterality: Right;   AV FISTULA PLACEMENT Right 12/17/2016   Procedure: ARTERIOVENOUS (AV) FISTULA CREATION- RIGHT BRACHIOCEPHALIC;  Surgeon: Elam Dutch, MD;  Location: [place] OR;  Service: Vascular;  Laterality: Right;   CAPD INSERTION N/A 11/03/2017   Procedure: LAPAROSCOPIC INSERTION CONTINUOUS AMBULATORY PERITONEAL DIALYSIS  (CAPD) CATHETER;  Surgeon: Clovis Riley, MD;   Location: [place];  Service: General;  Laterality: N/A;   CARDIAC CATHETERIZATION     [facility]   IR DIALY SHUNT INTRO [place] W/IMG RIGHT Right 04/24/2017   IR US GUIDE VASC ACCESS RIGHT  04/24/2017   PERIPHERAL VASCULAR BALLOON ANGIOPLASTY Right 09/05/2017   Procedure: PERIPHERAL VASCULAR BALLOON ANGIOPLASTY;  Surgeon: Angelia Mould, MD;  Location: [place] [facility];  Service: Cardiovascular;  Laterality: Right;   PROSTATE SURGERY     TIBIA FRACTURE SURGERY     Left    Family Hx:  Family History  Problem Relation Age of Onset   Cancer Other    Coronary artery disease Other     Social History:  reports that he quit smoking about 23 years ago. His smoking use included cigarettes. He started smoking about 40 years ago. He has a 25.00 pack-year smoking history. He has never used smokeless tobacco. He reports that he does not drink alcohol and does not use drugs.  Allergies: No Known Allergies  Medications: Prior to Admission medications   Medication Sig Start Date End Date Taking? Authorizing Provider  acetaminophen (TYLENOL) 650 MG CR tablet Take 650 mg by mouth daily as needed for pain.   Yes [provider]  apixaban (ELIQUIS) 5 MG TABS tablet Take 1 tablet (5 mg total) by mouth 2 (two) times daily. Patient taking differently: Take 2.5 mg by mouth 2 (two) times daily. 05/13/21  Yes Sharen Hones, MD  atenolol-chlorthalidone (TENORETIC) 50-25  MG tablet Take 1 tablet by mouth daily. 01/28/20  Yes [provider]  diltiazem (CARDIZEM CD) 240 MG 24 hr capsule Take 1 capsule (240 mg total) by mouth daily. 05/14/21  Yes Sharen Hones, MD  diltiazem (CARDIZEM) 60 MG tablet Take 60 mg by mouth 4 (four) times daily as needed (for rapid hr). 03/13/21 03/13/22 Yes [provider]  furosemide (LASIX) 80 MG tablet Take 80 mg by mouth 2 (two) times daily. 01/28/19  Yes [provider]  methocarbamol (ROBAXIN) 500 MG tablet Take 500 mg by mouth at  bedtime. 04/27/20  Yes [provider]  sodium bicarbonate 650 MG tablet Take 1,300 mg by mouth 3 (three) times daily.    Yes [provider]  tamsulosin (FLOMAX) 0.4 MG CAPS capsule Take 1 capsule by mouth daily as needed. 11/30/18  Yes [provider]  lidocaine-prilocaine (EMLA) cream Apply 1 application topically as needed (prior to dialysis).  08/06/17   [provider]  nitroGLYCERIN (NITROSTAT) 0.4 MG SL tablet Place 1 tablet (0.4 mg total) under the tongue as directed. Patient taking differently: Place 0.4 mg under the tongue every 5 (five) minutes as needed for chest pain. 11/19/16   Satira Sark, MD  oxyCODONE-acetaminophen (PERCOCET/ROXICET) 5-325 MG tablet Take 1 tablet by mouth every 6 (six) hours as needed for severe pain. Patient not taking: Reported on 08/09/2021 11/03/17   Clovis Riley, MD    I have reviewed the patient's current medications and reported prior to admission medications are listed above.  List does appear inaccurate; he is listed as being on atenolol-chlorthalidone.  He states he doesn't take atenolol chlorthalidone -just atenolol  Labs:  BMP Latest Ref Rng & Units 08/09/2021 05/12/2021 05/11/2021  Glucose 70 - 99 mg/dL 149(H) 100(H) 110(H)  BUN 8 - 23 mg/dL 46(H) 48(H) 48(H)  Creatinine 0.61 - 1.24 mg/dL 14.19(H) 11.57(H) 12.13(H)  Sodium 135 - 145 mmol/L 138 130(L) 132(L)  Potassium 3.5 - 5.1 mmol/L 4.0 3.9 4.0  Chloride 98 - 111 mmol/L 96(L) 91(L) 92(L)  CO2 22 - 32 mmol/L 27 30 30   Calcium 8.9 - 10.3 mg/dL 9.2 7.9(L) 8.1(L)    ROS:  Pertinent items noted in HPI and remainder of comprehensive ROS otherwise negative.  Physical Exam: Vitals:   08/09/21 1700 08/09/21 1715  BP: 125/78 126/79  Pulse: (!) 122   Resp: 19 15  Temp: 97.7 F (36.5 C)   SpO2: 98%      General: adult male in bed in NAD HEENT: NCAT Eyes:  EOMI sclera anicteric Neck: supple trachea midline Heart: S1S2 no rub Lungs: clear to  auscultation bilaterally and normal work of breathing at rest on room air Abdomen: soft/nt/nd Extremities: no edema appreciated; no cyanosis or clubbing Skin: no rash on extremities exposed Neuro: alert and oriented  provides hx and follows commands Access RIJ tunn catheter    Assessment/Plan:  # ESRD  - HD today - he has been noncompliant with HD for over a week  - will need to try to call unit again tomorrow for outpatient orders   # Afib with RVR - on dilt per primary team   # Anemia CKD  - PRBC's were ordered today - agree  - will get outpatient records  # HTN   - controlled on current regimen directed at controlling afib - home medication list does not appear accurate   # Metabolic bone disease  - will get outpatient records - check phos in am  Cecille Rubin  [place] 08/09/2021, 5:40 PM

## 2021-08-09 NOTE — ED Triage Notes (Signed)
Pt from home, reports not having dialysis since 07/30/21, pt found by EMS to be in Afib with RVR 140-160 rate, EMS administered Diltizem 25 bolus with 5 rate/hour following, pt alert and oriented, skin warm and dry.  Pt has generalized weakness for a few weeks.

## 2021-08-09 NOTE — Procedures (Signed)
° °  HEMODIALYSIS TREATMENT NOTE:   3.5 hour heparin-free HD session completed using right chest wall tunneled catheter. Goal and Qb were lowered in response to tachycardia 130-140.  One unit pRBC was transfused (he also received a unit via PIV before HD).  Pt was seen by Dr. Royce Macadamia on HD.  NET UF 1.1 liters.  Rockwell Alexandria, RN

## 2021-08-09 NOTE — ED Notes (Signed)
Hemoglobin 6.9 Dr. Tomi Bamberger notified.

## 2021-08-09 NOTE — H&P (Signed)
History and Physical    Rodney Gordon:[id number] DOB: 06-May-1956 DOA: 08/09/2021  PCP: Neale Burly, MD   Patient coming from: Home  I have personally briefly reviewed patient's old medical records in [place]  Chief Complaint: Generalized weakness, shortness of breath, palpitations and general malaise.  HPI: Rodney Gordon is a 66 y.o. male with medical history significant of end-stage renal disease on hemodialysis, history of atrial flutter on chronic anticoagulation (Eliquis), hypertension, anemia of chronic disease, peripheral vascular disease and history of coronary artery disease; who presented to the hospital secondary to general malaise, weakness, palpitations and shortness of breath with exertion.  Patient reports last hemodialysis treatment was 07/30/2021; he denies chest pain but expressed palpitation symptoms.  No nausea, no vomiting, decreased appetite also reported.  Patient is still making urine but expressed no dysuria or hematuria.  He has not been seen any melena or hematochezia either.  Denies abdominal pain.  In the ED work-up demonstrating significant anemia with a hemoglobin of 6.9 and A. fib with RVR.  Chest x-[place] without acute cardiopulmonary process.  PCR and influenza negative.  Patient is now vaccinated against COVID.  ED Course: Type and screen has been done, patient started on Cardizem drip with improvement in controlling his heart rate, nephrology service consulted and TRH has been called to place in the hospital for transfusion, and further evaluation and management.  Review of Systems: As per HPI otherwise all other systems reviewed and are negative.  Past Medical History:  Diagnosis Date   Anemia of chronic disease    Aortic insufficiency    Mild to moderate   Coronary atherosclerosis of native coronary artery    Nonobstructive at catheterization 2004   ESRD on hemodialysis [facility])    [name]   Essential hypertension    Peripheral  vascular disease [facility])     Past Surgical History:  Procedure Laterality Date   A/V FISTULAGRAM Right 09/05/2017   Procedure: A/V FISTULAGRAM;  Surgeon: Angelia Mould, MD;  Location: [place] [facility];  Service: Cardiovascular;  Laterality: Right;   AV FISTULA PLACEMENT Right 12/17/2016   Procedure: ARTERIOVENOUS (AV) FISTULA CREATION- RIGHT BRACHIOCEPHALIC;  Surgeon: Elam Dutch, MD;  Location: [place] OR;  Service: Vascular;  Laterality: Right;   CAPD INSERTION N/A 11/03/2017   Procedure: LAPAROSCOPIC INSERTION CONTINUOUS AMBULATORY PERITONEAL DIALYSIS  (CAPD) CATHETER;  Surgeon: Clovis Riley, MD;  Location: [place];  Service: General;  Laterality: N/A;   CARDIAC CATHETERIZATION     at [facility]   IR DIALY SHUNT INTRO [place] W/IMG RIGHT Right 04/24/2017   IR US GUIDE VASC ACCESS RIGHT  04/24/2017   PERIPHERAL VASCULAR BALLOON ANGIOPLASTY Right 09/05/2017   Procedure: PERIPHERAL VASCULAR BALLOON ANGIOPLASTY;  Surgeon: Angelia Mould, MD;  Location: [place] [facility];  Service: Cardiovascular;  Laterality: Right;   PROSTATE SURGERY     TIBIA FRACTURE SURGERY     Left    Social History  reports that he quit smoking about 23 years ago. His smoking use included cigarettes. He started smoking about 40 years ago. He has a 25.00 pack-year smoking history. He has never used smokeless tobacco. He reports that he does not drink alcohol and does not use drugs.  No Known Allergies  Family History  Problem Relation Age of Onset   Cancer Other    Coronary artery disease Other     Prior to Admission medications   Medication Sig Start Date End Date Taking? Authorizing  Provider  apixaban (ELIQUIS) 5 MG TABS tablet Take 1 tablet (5 mg total) by mouth 2 (two) times daily. 05/13/21  Yes Sharen Hones, MD  atenolol-chlorthalidone (TENORETIC) 50-25 MG tablet Take 1 tablet by mouth daily. 01/28/20  Yes [provider]  diltiazem (CARDIZEM CD) 240 MG 24 hr capsule  Take 1 capsule (240 mg total) by mouth daily. 05/14/21  Yes Sharen Hones, MD  diltiazem (CARDIZEM) 60 MG tablet Take 60 mg by mouth 4 (four) times daily as needed (for rapid hr). 03/13/21 03/13/22 Yes [provider]  furosemide (LASIX) 80 MG tablet Take 80 mg by mouth 2 (two) times daily. 01/28/19  Yes [provider]  methocarbamol (ROBAXIN) 500 MG tablet Take 500 mg by mouth at bedtime. 04/27/20  Yes [provider]  sodium bicarbonate 650 MG tablet Take 1,300 mg by mouth 3 (three) times daily.    Yes [provider]  tamsulosin (FLOMAX) 0.4 MG CAPS capsule Take 1 capsule by mouth daily as needed. 11/30/18  Yes [provider]  acetaminophen (TYLENOL) 650 MG CR tablet Take 650 mg by mouth daily as needed for pain.    [provider]  lidocaine-prilocaine (EMLA) cream Apply 1 application topically as needed (prior to dialysis).  08/06/17   [provider]  nitroGLYCERIN (NITROSTAT) 0.4 MG SL tablet Place 1 tablet (0.4 mg total) under the tongue as directed. Patient taking differently: Place 0.4 mg under the tongue every 5 (five) minutes as needed for chest pain. 11/19/16   Satira Sark, MD  oxyCODONE-acetaminophen (PERCOCET/ROXICET) 5-325 MG tablet Take 1 tablet by mouth every 6 (six) hours as needed for severe pain. Patient not taking: Reported on 08/09/2021 11/03/17   Clovis Riley, MD  sildenafil (VIAGRA) 50 MG tablet Take 50 mg by mouth daily as needed. 02/07/21   [provider]    Physical Exam: Vitals:   08/09/21 0747 08/09/21 0800 08/09/21 0830 08/09/21 0900  BP:  133/90 123/81 124/82  Pulse:  (!) 140 (!) 110 (!) 38  Resp:  12 15 17   Temp:      TempSrc:      SpO2:   98% 98%  Weight: 77 kg     Height: 6' (1.829 m)       Constitutional: Reporting palpitations, no nausea, no vomiting, no chest pain.  Patient in no acute distress. Vitals:   08/09/21 0747 08/09/21 0800 08/09/21 0830 08/09/21 0900  BP:  133/90  123/81 124/82  Pulse:  (!) 140 (!) 110 (!) 38  Resp:  12 15 17   Temp:      TempSrc:      SpO2:   98% 98%  Weight: 77 kg     Height: 6' (1.829 m)      Eyes: PERRL, lids and conjunctivae normal.  No icterus, no nystagmus. ENMT: Mucous membranes are moist. Posterior pharynx clear of any exudate or lesions. Neck: normal, supple, no masses, no thyromegaly, no JVD Respiratory: clear to auscultation bilaterally, no wheezing, no crackles.  No using accessory muscles; good saturation on room air. Cardiovascular: Irregular irregular, no rubs, no gallops, no JVD on exam. Abdomen: no tenderness, no masses palpated. No hepatosplenomegaly. Bowel sounds positive.  Musculoskeletal: no clubbing / cyanosis. No joint deformity upper and lower extremities. Good ROM, no contractures. Normal muscle tone.  Skin: no rashes, no petechiae. Neurologic: CN 2-12 grossly intact. Sensation intact, DTR normal. Strength 5/5 in all 4.  Psychiatric: Normal judgment and insight. Alert and oriented x 3. Normal  mood.   Labs on Admission: I have personally reviewed following labs and imaging studies  CBC: Recent Labs  Lab 08/09/21 0805  WBC 8.1  HGB 6.9*  HCT 23.0*  MCV 106.5*  PLT 017    Basic Metabolic Panel: Recent Labs  Lab 08/09/21 0805  NA 138  K 4.0  CL 96*  CO2 27  GLUCOSE 149*  BUN 46*  CREATININE 14.19*  CALCIUM 9.2    GFR: Estimated Creatinine Clearance: 5.7 mL/min (A) (by C-G formula based on SCr of 14.19 mg/dL (H)).  Liver Function Tests: Recent Labs  Lab 08/09/21 0805  AST 22  ALT 17  ALKPHOS 88  BILITOT 2.6*  PROT 6.8  ALBUMIN 2.2*    Urine analysis: No results found for: COLORURINE, APPEARANCEUR, LABSPEC, [place], GLUCOSEU, HGBUR, BILIRUBINUR, KETONESUR, PROTEINUR, UROBILINOGEN, NITRITE, LEUKOCYTESUR  Radiological Exams on Admission: DG Chest Portable 1 View  Result Date: 08/09/2021 CLINICAL DATA:  Weakness EXAM: PORTABLE CHEST 1 VIEW COMPARISON:  Chest x-[place] dated  June 08, 2021 FINDINGS: Cardiac and mediastinal contours are unchanged. Right central venous catheter with tip positioned near the superior cavoatrial junction. Left basilar atelectasis. No large pleural effusion or evidence of pneumothorax. IMPRESSION: No active disease. Electronically Signed   By: Yetta Glassman M.D.   On: 08/09/2021 08:37    EKG: Independently reviewed.  A. fib with RVR.  Assessment/Plan 1-Atrial fibrillation with rapid ventricular response ([place]) -Patient with underlying history of atrial flutter -Continue Eliquis for secondary prevention -Probably triggered by anemia and lack of hemodialysis for over 10 days. -Good response to the use of Cardizem drip -Continue for now along with low-dose metoprolol. -Patient will receive hemodialysis and 2 units of PRBCs -Checking TSH and 2D echo.  2-ESRD -Usually receives dialysis on Tuesday, Thursday and Saturday. -Last dialysis 07/30/2021 -Nephrology has been consulted -Continue to follow recommendations; plan is for hemodialysis later today.  3-anemia of chronic disease -No signs or complaints of overt bleeding -2 units of PRBCs will be transfused -IV iron and Epogen as per nephrology discretion.  4-hypertension -Stable overall -Continue to follow vital sign -Currently receiving Cardizem and metoprolol.  5-history of coronary artery disease -No chest pain -Follow 2D echo -Patient will be receiving metoprolol -Continue telemetry monitoring.  6-gastroesophageal flux disease -Continue PPI.  7-general malaise/weakness -In the setting of anemia, A. fib with RVR and lack of hemodialysis for over 10 days. -Treatment as mentioned above -Follow clinical response -Will also check B12, vitamin D and TSH.  DVT prophylaxis: Chronically on Eliquis. Code Status:   Full code Family Communication:  No family at bedside Disposition Plan:   Patient is from:  Home  Anticipated DC to:  Home  Anticipated DC date:  To be  determined  Anticipated DC barriers: Stabilization of hemoglobin and control of A. fib with RVR.  Consults called:  Nephrology service Admission status:  Inpatient, stepdown, length of stay more than 2 midnights.  Severity of Illness: The appropriate patient status for this patient is INPATIENT. Inpatient status is judged to be reasonable and necessary in order to provide the required intensity of service to ensure the patient's safety. The patient's presenting symptoms, physical exam findings, and initial radiographic and laboratory data in the context of their chronic comorbidities is felt to place them at high risk for further clinical deterioration. Furthermore, it is not anticipated that the patient will be medically stable for discharge from the hospital within 2 midnights of admission.   * I certify that at the point of  admission it is my clinical judgment that the patient will require inpatient hospital care spanning beyond 2 midnights from the point of admission due to high intensity of service, high risk for further deterioration and high frequency of surveillance required.Barton Dubois MD [facility]  How to contact the [facility] Attending or Consulting provider [place] or covering provider during after hours [place], for this patient?   Check the care team in [facility] and look for a) attending/consulting [facility] provider listed and b) the [facility] team listed Log into [email or web address] and use [place]'s universal password to access. If you do not have the password, please contact the hospital operator. Locate the [facility] provider you are looking for under [facility] and page to a number that you can be directly reached. If you still have difficulty reaching the provider, please page the [facility] (Director on Call) for the Hospitalists listed on amion for assistance.  08/09/2021, 9:43 AM

## 2021-08-09 NOTE — ED Notes (Signed)
Echo in progress at this time.

## 2021-08-09 NOTE — ED Provider Notes (Signed)
[facility] EMERGENCY DEPARTMENT Provider Note   CSN: [id number] Arrival date & time: 08/09/21  0732     History  Chief complaint: Weakness, malaise, rapid heart rate  Rodney Gordon is a 66 y.o. male.  HPI  Patient has history of end-stage renal disease on dialysis, aortic insufficiency, coronary artery disease, chronic anemia, hypertension who presents to the ED with complaints of rapid heart rate.  Patient states he has not been feeling well for the last 5 days or so.  He has been nauseated.  He has been vomiting and has a burning discomfort in his chest when he vomits.  Patient has been feeling weak all over.  He also has noticed his heart has been racing.  Patient has not gone to dialysis for over a week.  Per EMS his last dialysis was January 9.  Patient states he did not go to dialysis because he was not feeling well.  EMS was called today and they noticed his heart rate was in the 140s to 160s.  He was given diltiazem.  Home Medications Prior to Admission medications   Medication Sig Start Date End Date Taking? Authorizing Provider  acetaminophen (TYLENOL) 650 MG CR tablet Take 650 mg by mouth daily as needed for pain.    [provider]  apixaban (ELIQUIS) 5 MG TABS tablet Take 1 tablet (5 mg total) by mouth 2 (two) times daily. 05/13/21   Sharen Hones, MD  atenolol-chlorthalidone (TENORETIC) 50-25 MG tablet Take 1 tablet by mouth daily. 01/28/20   [provider]  diltiazem (CARDIZEM CD) 240 MG 24 hr capsule Take 1 capsule (240 mg total) by mouth daily. 05/14/21   Sharen Hones, MD  diltiazem (CARDIZEM) 60 MG tablet Take 60 mg by mouth 4 (four) times daily as needed (for rapid hr). 03/13/21 03/13/22  [provider]  furosemide (LASIX) 80 MG tablet Take 80 mg by mouth 2 (two) times daily. 01/28/19   [provider]  lidocaine-prilocaine (EMLA) cream Apply 1 application topically as needed (prior to dialysis).  08/06/17   [provider]   methocarbamol (ROBAXIN) 500 MG tablet Take 500 mg by mouth at bedtime. 04/27/20   [provider]  nitroGLYCERIN (NITROSTAT) 0.4 MG SL tablet Place 1 tablet (0.4 mg total) under the tongue as directed. Patient taking differently: Place 0.4 mg under the tongue every 5 (five) minutes as needed for chest pain. 11/19/16   Satira Sark, MD  oxyCODONE-acetaminophen (PERCOCET/ROXICET) 5-325 MG tablet Take 1 tablet by mouth every 6 (six) hours as needed for severe pain. 11/03/17   Clovis Riley, MD  sildenafil (VIAGRA) 50 MG tablet Take 50 mg by mouth daily as needed. 02/07/21   [provider]  sodium bicarbonate 650 MG tablet Take 1,300 mg by mouth 3 (three) times daily.     [provider]  tamsulosin (FLOMAX) 0.4 MG CAPS capsule Take 1 capsule by mouth daily. 11/30/18   [provider]      Allergies    Patient has no known allergies.    Review of Systems   Review of Systems  Constitutional:  Negative for fatigue.  Gastrointestinal:  Negative for abdominal pain.  Neurological:  Positive for weakness.   Physical Exam Updated Vital Signs BP 124/82    Pulse (!) 38    Temp 97.8 F (36.6 C) (Oral)    Resp 17    Ht 1.829 m (6')    Wt 77 kg    SpO2 98%  BMI 23.02 kg/m  Physical Exam Vitals and nursing note reviewed.  Constitutional:      Appearance: He is well-developed. He is ill-appearing.  HENT:     Head: Normocephalic and atraumatic.     Right Ear: External ear normal.     Left Ear: External ear normal.  Eyes:     General: No scleral icterus.       Right eye: No discharge.        Left eye: No discharge.     Conjunctiva/sclera: Conjunctivae normal.  Neck:     Trachea: No tracheal deviation.  Cardiovascular:     Rate and Rhythm: Tachycardia present. Rhythm irregular.  Pulmonary:     Effort: Pulmonary effort is normal. No respiratory distress.     Breath sounds: Normal breath sounds. No stridor. No wheezing or rales.  Abdominal:      General: Bowel sounds are normal. There is no distension.     Palpations: Abdomen is soft.     Tenderness: There is no abdominal tenderness. There is no guarding or rebound.  Musculoskeletal:        General: No tenderness or deformity.     Cervical back: Neck supple.  Skin:    General: Skin is warm and dry.     Findings: No rash.  Neurological:     General: No focal deficit present.     Mental Status: He is alert.     Cranial Nerves: No cranial nerve deficit (no facial droop, extraocular movements intact, no slurred speech).     Sensory: No sensory deficit.     Motor: No abnormal muscle tone or seizure activity.     Coordination: Coordination normal.  Psychiatric:        Mood and Affect: Mood normal.    ED Results / Procedures / Treatments   Labs (all labs ordered are listed, but only abnormal results are displayed) Labs Reviewed  CBC - Abnormal; Notable for the following components:      Result Value   RBC 2.16 (*)    Hemoglobin 6.9 (*)    HCT 23.0 (*)    MCV 106.5 (*)    RDW 16.9 (*)    All other components within normal limits  COMPREHENSIVE METABOLIC PANEL - Abnormal; Notable for the following components:   Chloride 96 (*)    Glucose, Bld 149 (*)    BUN 46 (*)    Creatinine, Ser 14.19 (*)    Albumin 2.2 (*)    Total Bilirubin 2.6 (*)    GFR, Estimated 3 (*)    All other components within normal limits  TROPONIN I (HIGH SENSITIVITY) - Abnormal; Notable for the following components:   Troponin I (High Sensitivity) 21 (*)    All other components within normal limits  RESP PANEL BY RT-PCR (FLU A&B, COVID) ARPGX2  LIPASE, BLOOD  POC OCCULT BLOOD, ED  TYPE AND SCREEN  TROPONIN I (HIGH SENSITIVITY)    EKG EKG Interpretation  Date/Time:  Thursday August 09 2021 07:39:53 EST Ventricular Rate:  149 PR Interval:    QRS Duration: 75 QT Interval:  309 QTC Calculation: 487 R Axis:   62 Text Interpretation: atrial flutter variable block Borderline low voltage,  extremity leads Nonspecific T abnormalities, lateral leads Borderline prolonged QT interval atrial flutter is new since last tracing Confirmed by Dorie Rank [phone]) on 08/09/2021 7:42:51 AM  Radiology DG Chest Portable 1 View  Result Date: 08/09/2021 CLINICAL DATA:  Weakness EXAM: PORTABLE CHEST 1 VIEW COMPARISON:  Chest x-[place] dated June 08, 2021 FINDINGS: Cardiac and mediastinal contours are unchanged. Right central venous catheter with tip positioned near the superior cavoatrial junction. Left basilar atelectasis. No large pleural effusion or evidence of pneumothorax. IMPRESSION: No active disease. Electronically Signed   By: Yetta Glassman M.D.   On: 08/09/2021 08:37    Procedures .Critical Care Performed by: Dorie Rank, MD Authorized by: Dorie Rank, MD   Critical care provider statement:    Critical care time (minutes):  30   Critical care was time spent personally by me on the following activities:  Development of treatment plan with patient or surrogate, discussions with consultants, evaluation of patient's response to treatment, examination of patient, ordering and review of laboratory studies, ordering and review of radiographic studies, ordering and performing treatments and interventions, pulse oximetry, re-evaluation of patient's condition and review of old charts    Medications Ordered in ED Medications  diltiazem (CARDIZEM) 1 mg/mL load via infusion 15 mg (15 mg Intravenous Bolus from Bag 08/09/21 0813)    And  diltiazem (CARDIZEM) 125 mg in dextrose 5% 125 mL (1 mg/mL) infusion (10 mg/hr Intravenous Rate/Dose Change 08/09/21 0902)  ondansetron (ZOFRAN) injection 4 mg (4 mg Intravenous Given 08/09/21 0752)    ED Course/ Medical Decision Making/ A&P Clinical Course as of 08/09/21 0936  Thu Aug 09, 2021  0909 Comprehensive metabolic panel(!) BUN/creatinine elevated consistent with his chronic kidney disease [JK]  0909 CBC(!!) Hemoglobin decreased at 6.9.  Decreased from  previous value although was 6.9 few months ago [JK]  0910 DG Chest Portable 1 View Chest x-[place] images and report reviewed.  No evidence of pneumonia [JK]  0910 Heart rate decreasing in to the 110s 120s with Cardizem [JK]  0921 D/w Dr Royce Macadamia nephrology [JK]  [phone] D/w Dr Dyann Kief, hospitalist service [JK]    Clinical Course User Index [JK] Dorie Rank, MD                           Medical Decision Making Amount and/or Complexity of Data Reviewed Labs: ordered. Decision-making details documented in ED Course. Radiology: ordered. Decision-making details documented in ED Course. ECG/medicine tests: ordered.  Risk Prescription drug management. Decision regarding hospitalization.  Weakness Likely multifactorial, related to his rapid heart rate, lack of dialysis and worsening anemia  Anemia No signs of GI bleeding.  Patient denies any blood in his stool.  Anemia is worsening.  Suspect related to his chronic kidney disease.  We will guaiac his stools.  Sent off a type and screen.  Will need a blood  transfusion  Chronic kidney disease Patient has not gone to dialysis for 9 days.  Suspect his nausea and weakness is partly related to this fact.  No signs of any acute electrolyte abnormalities at this time requiring emergent dialysis.  I will consult the nephrologist as he will need to be admitted and will require dialysis.  Tachycardia Patient does have a history of atrial fibrillation.  Outside records reviewed from his admission to the hospital on November 18 at [facility].  Patient documented to have atrial fibrillation during that medical admission.  Patient was evaluated by cardiology during that admission.  They did not feel that he was a good cardioversion candidate.  Patient had been on Cardizem and also started on Toprol.  He is on chronic Eliquis anticoagulation.  Patient has required IV Cardizem infusion to help stabilize his rate.  We will continue on Cardizem  infusion.  Final Clinical Impression(s) / ED Diagnoses Final diagnoses:  Atrial flutter with rapid ventricular response ([place])  Stage 5 chronic kidney disease on chronic dialysis ([place])  Noncompliance with renal dialysis ([place])  Anemia, unspecified type     Dorie Rank, MD 08/09/21 [phone]

## 2021-08-09 NOTE — Progress Notes (Signed)
*  PRELIMINARY RESULTS* Echocardiogram 2D Echocardiogram has been performed.  Rodney Gordon 08/09/2021, 11:20 AM

## 2021-08-10 DIAGNOSIS — I5021 Acute systolic (congestive) heart failure: Secondary | ICD-10-CM

## 2021-08-10 LAB — TYPE AND SCREEN
ABO/RH(D): O POS
Antibody Screen: NEGATIVE
Unit division: 0
Unit division: 0

## 2021-08-10 LAB — BPAM RBC
Blood Product Expiration Date: 202302012359
Blood Product Expiration Date: 202302162359
ISSUE DATE / TIME: 202301191321
ISSUE DATE / TIME: 202301191702
Unit Type and Rh: 5100
Unit Type and Rh: 5100

## 2021-08-10 LAB — RENAL FUNCTION PANEL
Albumin: 2.2 g/dL — ABNORMAL LOW (ref 3.5–5.0)
Anion gap: 12 (ref 5–15)
BUN: 20 mg/dL (ref 8–23)
CO2: 28 mmol/L (ref 22–32)
Calcium: 8.6 mg/dL — ABNORMAL LOW (ref 8.9–10.3)
Chloride: 95 mmol/L — ABNORMAL LOW (ref 98–111)
Creatinine, Ser: 7.25 mg/dL — ABNORMAL HIGH (ref 0.61–1.24)
GFR, Estimated: 8 mL/min — ABNORMAL LOW (ref >60)
Glucose, Bld: 99 mg/dL (ref 70–99)
Phosphorus: 2.3 mg/dL — ABNORMAL LOW (ref 2.5–4.6)
Potassium: 3.6 mmol/L (ref 3.5–5.1)
Sodium: 135 mmol/L (ref 135–145)

## 2021-08-10 LAB — HEPATITIS B SURFACE ANTIBODY, QUANTITATIVE: Hep B S AB Quant (Post): 10.9 m[IU]/mL (ref >9.9)

## 2021-08-10 LAB — CBC
HCT: 32.8 % — ABNORMAL LOW (ref 39.0–52.0)
Hemoglobin: 10.6 g/dL — ABNORMAL LOW (ref 13.0–17.0)
MCH: 31.4 pg (ref 26.0–34.0)
MCHC: 32.3 g/dL (ref 30.0–36.0)
MCV: 97 fL (ref 80.0–100.0)
Platelets: 321 10*3/uL (ref 150–400)
RBC: 3.38 MIL/uL — ABNORMAL LOW (ref 4.22–5.81)
RDW: 18.7 % — ABNORMAL HIGH (ref 11.5–15.5)
WBC: 10.9 10*3/uL — ABNORMAL HIGH (ref 4.0–10.5)
nRBC: 0 % (ref 0.0–0.2)

## 2021-08-10 MED ORDER — CHLORHEXIDINE GLUCONATE CLOTH 2 % EX PADS
6.0000 | MEDICATED_PAD | Freq: Every day | CUTANEOUS | Status: DC
  Administered 2021-08-11 – 2021-08-12 (×2): 6 via TOPICAL

## 2021-08-10 MED ORDER — DILTIAZEM HCL 60 MG PO TABS
120.0000 mg | ORAL_TABLET | Freq: Three times a day (TID) | ORAL | Status: DC
  Administered 2021-08-10 – 2021-08-11 (×4): 120 mg via ORAL
  Filled 2021-08-10 (×4): qty 2

## 2021-08-10 NOTE — TOC Progression Note (Signed)
°  Transition of Care [facility]) Screening Note   Patient Details  Name: Rodney Gordon Date of Birth: 04-25-1956   Transition of Care [facility]) CM/SW Contact:    Shade Flood, LCSW Phone Number: 08/10/2021, 11:25 AM    Transition of Care Department [facility]) has reviewed patient and no TOC needs have been identified at this time. We will continue to monitor patient advancement through interdisciplinary progression rounds. If new patient transition needs arise, please place a TOC consult.

## 2021-08-10 NOTE — Progress Notes (Signed)
PROGRESS NOTE    Rodney Gordon  ZJI:[id number] DOB: 16-May-1956 DOA: 08/09/2021 PCP: Neale Burly, MD    No chief complaint on file.   Brief Narrative:  Rodney Gordon is a 66 y.o. male with medical history significant of end-stage renal disease on hemodialysis, history of atrial flutter on chronic anticoagulation (Eliquis), hypertension, anemia of chronic disease, peripheral vascular disease and history of coronary artery disease; who presented to the hospital secondary to general malaise, weakness, palpitations and shortness of breath with exertion.  Patient reports last hemodialysis treatment was 07/30/2021; he denies chest pain but expressed palpitation symptoms.  No nausea, no vomiting, decreased appetite also reported.  Patient is still making urine but expressed no dysuria or hematuria.  He has not been seen any melena or hematochezia either.  Denies abdominal pain.   In the ED work-up demonstrating significant anemia with a hemoglobin of 6.9 and A. fib with RVR.  Chest x-Grisso without acute cardiopulmonary process.  PCR and influenza negative.  Patient is now vaccinated against COVID.   ED Course: Type and screen has been done, patient started on Cardizem drip with improvement in controlling his heart rate, nephrology service consulted and [facility] has been called to place in the hospital for transfusion, and further evaluation and management.   Assessment & Plan: 1-atrial fibrillation with rapid ventricular response -Patient with underlying history of atrial flutter -Have responded appropriately to the use of Cardizem drip, blood transfusion and hemodialysis. -Currently will transition off Cardizem drip and use oral Cardizem along with metoprolol. -Patient will be transferred to telemetry bed. -Continue Eliquis for secondary prevention -2D echo with ejection fraction of 30 to 35% by estimation, moderate left ventricular decreased function and global hypokinesis.  Normal pulmonary systolic  pressure and no significant valvular abnormalities.  2-ESRD -Nephrology consulted -Continue inpatient hemodialysis -Follow recommendations.  3-acute systolic heart failure -Probably driven by arrhythmia -Continue to follow daily weights, low-sodium diet and strict intake and output -Patient has been started on Lopressor -will have cardiology seen patient and follow further rec's.  4-GERD -continue PPI  5-hypertension -Stable overall -For now continue Cardizem and metoprolol.  6-history of coronary disease -No chest pain -2D echo with new ejection fraction decreased; probably driven by arrhythmia -Global hypokinesis -Continue telemetry monitoring -Follow cardiology recommendations.  7-acute on chronic anemia -No overt bleeding -10.7 after 2 units of PRBC transfused -IV iron and Epogen as per nephrology discretion.   DVT prophylaxis: Chronically on Eliquis Code Status: Full code Family Communication: No family at bedside. Disposition:   Status is: Inpatient    Consultants:  Nephrology service Cardiology  Procedures:  See below for x-Indelicato report 2D echo  Antimicrobials:  none   Subjective: Reports feeling better.  No chest pain, no nausea, no vomiting.  Overall improved in his breathing and feeling stronger.  No operative bleeding.  Objective: Vitals:   08/10/21 0300 08/10/21 0400 08/10/21 0500 08/10/21 0700  BP:  129/79 124/73   Pulse: 63 84 90   Resp: 18 11 (!) 7   Temp:    97.8 F (36.6 C)  TempSrc:    Oral  SpO2: 97% 93% 94%   Weight:   62.4 kg   Height:        Intake/Output Summary (Last 24 hours) at 08/10/2021 0852 Last data filed at 08/10/2021 0409 Gross per 24 hour  Intake 874.43 ml  Output 1104 ml  Net -229.57 ml   Filed Weights   08/09/21 1126 08/09/21 1415 08/10/21 0500  Weight: 60.4 kg 60.4 kg 62.4 kg    Examination:  General exam: Appears calm and comfortable; denies chest pain and palpitations. Respiratory system: Clear to  auscultation. Respiratory effort normal.  Good saturation on room air. Cardiovascular system: Rate controlled, no rubs, no gallops, irregular rhythm.  No JVD on exam. Gastrointestinal system: Abdomen is nondistended, soft and nontender. No organomegaly or masses felt. Normal bowel sounds heard. Central nervous system: Alert and oriented. No focal neurological deficits. Extremities: No cyanosis or clubbing. Skin: No petechiae. Psychiatry: Judgement and insight appear normal. Mood & affect appropriate.     Data Reviewed: I have personally reviewed following labs and imaging studies  CBC: Recent Labs  Lab 08/09/21 0805 08/09/21 1935 08/10/21 0350  WBC 8.1  --  10.9*  HGB 6.9* 10.7* 10.6*  HCT 23.0* 31.4* 32.8*  MCV 106.5*  --  97.0  PLT 346  --  409    Basic Metabolic Panel: Recent Labs  Lab 08/09/21 0805 08/10/21 0350  NA 138 135  K 4.0 3.6  CL 96* 95*  CO2 27 28  GLUCOSE 149* 99  BUN 46* 20  CREATININE 14.19* 7.25*  CALCIUM 9.2 8.6*  PHOS  --  2.3*    GFR: Estimated Creatinine Clearance: 9 mL/min (A) (by C-G formula based on SCr of 7.25 mg/dL (H)).  Liver Function Tests: Recent Labs  Lab 08/09/21 0805 08/10/21 0350  AST 22  --   ALT 17  --   ALKPHOS 88  --   BILITOT 2.6*  --   PROT 6.8  --   ALBUMIN 2.2* 2.2*    CBG: No results for input(s): GLUCAP in the last 168 hours.   Recent Results (from the past 240 hour(s))  Resp Panel by RT-PCR (Flu A&B, Covid) Nasopharyngeal Swab     Status: None   Collection Time: 08/09/21  7:50 AM   Specimen: Nasopharyngeal Swab; Nasopharyngeal(NP) swabs in vial transport medium  Result Value Ref Range Status   SARS Coronavirus 2 by RT PCR NEGATIVE NEGATIVE Final    Comment: (NOTE) SARS-CoV-2 target nucleic acids are NOT DETECTED.  The SARS-CoV-2 RNA is generally detectable in upper respiratory specimens during the acute phase of infection. The lowest concentration of SARS-CoV-2 viral copies this assay can detect  is 138 copies/mL. A negative result does not preclude SARS-Cov-2 infection and should not be used as the sole basis for treatment or other patient management decisions. A negative result may occur with  improper specimen collection/handling, submission of specimen other than nasopharyngeal swab, presence of viral mutation(s) within the areas targeted by this assay, and inadequate number of viral copies(<138 copies/mL). A negative result must be combined with clinical observations, patient history, and epidemiological information. The expected result is Negative.  Fact Sheet for Patients:  EntrepreneurPulse.com.au  Fact Sheet for Healthcare Providers:  IncredibleEmployment.be  This test is no t yet approved or cleared by the [place] FDA and  has been authorized for detection and/or diagnosis of SARS-CoV-2 by FDA under an Emergency Use Authorization (EUA). This EUA will remain  in effect (meaning this test can be used) for the duration of the COVID-19 declaration under Section 564(b)(1) of the Act, 21 U.S.C.section 360bbb-3(b)(1), unless the authorization is terminated  or revoked sooner.       Influenza A by PCR NEGATIVE NEGATIVE Final   Influenza B by PCR NEGATIVE NEGATIVE Final    Comment: (NOTE) The Xpert Xpress SARS-CoV-2/FLU/RSV plus assay is intended as an aid in the diagnosis of  influenza from Nasopharyngeal swab specimens and should not be used as a sole basis for treatment. Nasal washings and aspirates are unacceptable for Xpert Xpress SARS-CoV-2/FLU/RSV testing.  Fact Sheet for Patients: EntrepreneurPulse.com.au  Fact Sheet for Healthcare Providers: IncredibleEmployment.be  This test is not yet approved or cleared by the [place] FDA and has been authorized for detection and/or diagnosis of SARS-CoV-2 by FDA under an Emergency Use Authorization (EUA). This EUA will remain in effect  (meaning this test can be used) for the duration of the COVID-19 declaration under Section 564(b)(1) of the Act, 21 U.S.C. section 360bbb-3(b)(1), unless the authorization is terminated or revoked.  Performed at [facility], [address], [place], [place] [address]   MRSA Next Gen by PCR, Nasal     Status: None   Collection Time: 08/09/21 12:17 PM   Specimen: Nasal Mucosa; Nasal Swab  Result Value Ref Range Status   MRSA by PCR Next Gen NOT DETECTED NOT DETECTED Final    Comment: (NOTE) The GeneXpert MRSA Assay (FDA approved for NASAL specimens only), is one component of a comprehensive MRSA colonization surveillance program. It is not intended to diagnose MRSA infection nor to guide or monitor treatment for MRSA infections. Test performance is not FDA approved in patients less than 22 years old. Performed at [facility], [address] [place], [place] [address]          Radiology Studies: DG Chest Portable 1 View  Result Date: 08/09/2021 CLINICAL DATA:  Weakness EXAM: PORTABLE CHEST 1 VIEW COMPARISON:  Chest x-[place] dated June 08, 2021 FINDINGS: Cardiac and mediastinal contours are unchanged. Right central venous catheter with tip positioned near the superior cavoatrial junction. Left basilar atelectasis. No large pleural effusion or evidence of pneumothorax. IMPRESSION: No active disease. Electronically Signed   By: Yetta Glassman M.D.   On: 08/09/2021 08:37   ECHOCARDIOGRAM COMPLETE  Result Date: 08/09/2021    ECHOCARDIOGRAM REPORT   Patient Name:   MIRANDA GARBER Test Date of Exam: 08/09/2021 Medical Rec #:  [id number]     Height:       72.0 in Accession #:    [id number]    Weight:       169.8 lb Date of Birth:  March 05, 1956    BSA:          1.987 m Patient Age:    30 years      BP:           118/86 mmHg Patient Gender: M             HR:           102 bpm. Exam Location:  [facility] Procedure: 2D Echo, Cardiac Doppler and Color Doppler Indications:    Atrial Fibrillation I48.91   History:        Patient has prior history of Echocardiogram examinations, most                 recent 02/28/2021. Aortic Valve Disease, Arrythmias:Atrial                 Fibrillation; Risk Factors:Hypertension. ESRD on hemodialysis                 ([place]) (From Hx), Palpitations, Aortic regurgitation.  Sonographer:    Alvino Chapel RCS Referring Phys: [place]  1. Left ventricular ejection fraction, by estimation, is 30 to 35%. The left ventricle has moderately decreased function. The left ventricle demonstrates global hypokinesis. There is mild left  ventricular hypertrophy. Left ventricular diastolic parameters are indeterminate.  2. Right ventricular systolic function is normal. The right ventricular size is normal. There is normal pulmonary artery systolic pressure.  3. Left atrial size was severely dilated.  4. The pericardial effusion is circumferential.  5. The MR vena contracta is 0.37 cm. . The mitral valve is abnormal. Mild to moderate mitral valve regurgitation. No evidence of mitral stenosis.  6. The tricuspid valve is abnormal. Tricuspid valve regurgitation is moderate.  7. The aortic valve is tricuspid. There is mild calcification of the aortic valve. There is mild thickening of the aortic valve. Aortic valve regurgitation is moderate. No aortic stenosis is present.  8. The inferior vena cava is normal in size with greater than 50% respiratory variability, suggesting right atrial pressure of 3 mmHg. FINDINGS  Left Ventricle: Left ventricular ejection fraction, by estimation, is 30 to 35%. The left ventricle has moderately decreased function. The left ventricle demonstrates global hypokinesis. The left ventricular internal cavity size was normal in size. There is mild left ventricular hypertrophy. Left ventricular diastolic parameters are indeterminate. Right Ventricle: The right ventricular size is normal. Right vetricular wall thickness was not well visualized. Right ventricular  systolic function is normal. There is normal pulmonary artery systolic pressure. The tricuspid regurgitant velocity is 2.56 m/s, and with an assumed right atrial pressure of 3 mmHg, the estimated right ventricular systolic pressure is 63.8 mmHg. Left Atrium: Left atrial size was severely dilated. Right Atrium: Right atrial size was normal in size. Pericardium: Trivial pericardial effusion is present. The pericardial effusion is circumferential. Mitral Valve: The MR vena contracta is 0.37 cm. The mitral valve is abnormal. There is mild thickening of the mitral valve leaflet(s). There is mild calcification of the mitral valve leaflet(s). Mild mitral annular calcification. Mild to moderate mitral valve regurgitation. No evidence of mitral valve stenosis. Tricuspid Valve: The tricuspid valve is abnormal. Tricuspid valve regurgitation is moderate . No evidence of tricuspid stenosis. Aortic Valve: The aortic valve is tricuspid. There is mild calcification of the aortic valve. There is mild thickening of the aortic valve. There is mild aortic valve annular calcification. Aortic valve regurgitation is moderate. Aortic regurgitation PHT  measures 588 msec. No aortic stenosis is present. Aortic valve mean gradient measures 3.7 mmHg. Aortic valve peak gradient measures 7.5 mmHg. Aortic valve area, by VTI measures 2.03 cm. Pulmonic Valve: The pulmonic valve was not well visualized. Pulmonic valve regurgitation is not visualized. No evidence of pulmonic stenosis. Aorta: The aortic root is normal in size and structure. Venous: The inferior vena cava is normal in size with greater than 50% respiratory variability, suggesting right atrial pressure of 3 mmHg. IAS/Shunts: No atrial level shunt detected by color flow Doppler.  LEFT VENTRICLE PLAX 2D LVIDd:         5.30 cm LVIDs:         4.55 cm LV PW:         1.10 cm LV IVS:        1.00 cm LVOT diam:     2.00 cm LV SV:         45 LV SV Index:   23 LVOT Area:     3.14 cm  RIGHT  VENTRICLE RV S prime:     10.00 cm/s TAPSE (M-mode): 1.5 cm LEFT ATRIUM              Index        RIGHT ATRIUM  Index LA diam:        3.50 cm  1.76 cm/m   RA Area:     20.50 cm LA Vol (A2C):   101.0 ml 50.83 ml/m  RA Volume:   64.90 ml  32.66 ml/m LA Vol (A4C):   122.0 ml 61.39 ml/m LA Biplane Vol: 119.0 ml 59.88 ml/m  AORTIC VALVE AV Area (Vmax):    2.13 cm AV Area (Vmean):   2.16 cm AV Area (VTI):     2.03 cm AV Vmax:           136.57 cm/s AV Vmean:          90.434 cm/s AV VTI:            0.223 m AV Peak Grad:      7.5 mmHg AV Mean Grad:      3.7 mmHg LVOT Vmax:         92.43 cm/s LVOT Vmean:        62.133 cm/s LVOT VTI:          0.144 m LVOT/AV VTI ratio: 0.65 AI PHT:            588 msec  AORTA Ao Root diam: 3.40 cm MITRAL VALVE                  TRICUSPID VALVE MV Area (PHT): 3.72 cm       TR Peak grad:   26.2 mmHg MV Decel Time: 204 msec       TR Vmax:        256.00 cm/s MR Peak grad:    92.2 mmHg MR Mean grad:    58.0 mmHg    SHUNTS MR Vmax:         480.00 cm/s  Systemic VTI:  0.14 m MR Vmean:        352.0 cm/s   Systemic Diam: 2.00 cm MR PISA:         2.26 cm MR PISA Eff ROA: 11 mm MR PISA Radius:  0.60 cm MV E velocity: 121.00 cm/s Carlyle Dolly MD Electronically signed by Carlyle Dolly MD Signature Date/Time: 08/09/2021/1:20:21 PM    Final         Scheduled Meds:  sodium chloride   Intravenous Once   apixaban  5 mg Oral BID   Chlorhexidine Gluconate Cloth  6 each Topical Daily   Chlorhexidine Gluconate Cloth  6 each Topical Q0600   metoprolol tartrate  25 mg Oral BID   pantoprazole  40 mg Oral Daily   Continuous Infusions:  sodium chloride     sodium chloride     diltiazem (CARDIZEM) infusion 5 mg/hr (08/10/21 0409)     LOS: 1 day    Barton Dubois, MD [facility]   To contact the attending provider between 7A-7P or the covering provider during after hours 7P-7A, please log into the web site [email or web address] and access using universal [place] password  for that web site. If you do not have the password, please call the hospital operator.  08/10/2021, 8:52 AM

## 2021-08-10 NOTE — Progress Notes (Signed)
[facility] Progress Note  Name: BALIN VANDEGRIFT MRN: [id number] DOB: [birth year]  Chief Complaint:  Fatigue and palpitations   Subjective:  Got HD on 1/19 with 1.1 kg UF. Limited a bit by tachycardia.  He is now off of dilt gtt.   Review of systems:  Denies shortness of breath or chest pain  Denies n/v  --------- Background on referral:  PHARELL ROLFSON is a 66 y.o. male with a history of ESRD on hemodialysis TTS at [facility],  coronary artery disease, hypertension presented to the ER with fatigue and nausea.  He also had a subjective feeling of heart racing.  Has missed a week of dialysis already (last HD reportedly 07/30/21).  He called EMS.  Per ER charting EMS noted heart rate 140s to 160s and he was given dilt.  He has been admitted to the ICU.  Nephrology is consulted for assistance with management of ESRD.  Note that he was seen by [facility] during an October 2022 hospitalization at another facility.  He was previously on PD and had peritonitis.  He feels better now that he's here.  He is seen and examined on HD.  His UF was decreased earlier and I ultimately turned off as HR jumps to 130's nonsustained.  Catheter has been working well.    Intake/Output Summary (Last 24 hours) at 08/10/2021 1149 Last data filed at 08/10/2021 0932 Gross per 24 hour  Intake 852.53 ml  Output 1134 ml  Net -281.47 ml    Vitals:  Vitals:   08/10/21 0700 08/10/21 0800 08/10/21 0900 08/10/21 1100  BP: 125/83 109/63    Pulse: 79 70    Resp: 19 (!) 23 18   Temp: 97.8 F (36.6 C)   97.8 F (36.6 C)  TempSrc: Oral   Oral  SpO2: 90% 92%    Weight:      Height:         Physical Exam:  General: adult male in bed in NAD HEENT: NCAT Eyes:  EOMI sclera anicteric Neck: supple trachea midline Heart: S1S2 no rub with HR 90 Lungs: clear to auscultation bilaterally and normal work of breathing at rest on room air  Abdomen: soft/nt/nd Extremities: no edema appreciated; no  cyanosis or clubbing Skin: no rash on extremities exposed Neuro: alert and oriented  provides hx and follows commands Access RIJ tunn catheter    Medications reviewed   Labs:  BMP Latest Ref Rng & Units 08/10/2021 08/09/2021 05/12/2021  Glucose 70 - 99 mg/dL 99 149(H) 100(H)  BUN 8 - 23 mg/dL 20 46(H) 48(H)  Creatinine 0.61 - 1.24 mg/dL 7.25(H) 14.19(H) 11.57(H)  Sodium 135 - 145 mmol/L 135 138 130(L)  Potassium 3.5 - 5.1 mmol/L 3.6 4.0 3.9  Chloride 98 - 111 mmol/L 95(L) 96(L) 91(L)  CO2 22 - 32 mmol/L 28 27 30   Calcium 8.9 - 10.3 mg/dL 8.6(L) 9.2 7.9(L)   Outpatient HD orders:  TTS at [facility] 64 kg  4 hours (usually just stays 3 hrs) Bath 3K /2.5 Ca No heparin  Max pulled per hour is 1.1 kg  BF 350  DF 500  Last HD tx on 07/28/21 and post weight was 63.9 kg; usually gets to EDW  Meds: mircera 60 mg every 2 weeks - he missed a dose - one is due; no hectorol   RIJ tunn catheter   Assessment/Plan:   # ESRD  - Continue HD per TTS schedule with next HD tomorrow.  Limited  UF as he is actually below EDW pre-HD if charted weights accurate and no evidence of overload on exam.     # Afib with RVR - on dilt per primary team    # Anemia CKD  - s/p PRBC's with improvement    # HTN   - controlled on current regimen directed at controlling afib - home medication list does not appear accurate   # Metabolic bone disease  - not on a binder and phos is slightly low   Disposition per primary team.  Given tachycardia with last HD and admission with afib with RVR would probably benefit from HD here tomorrow.  Nephrology available if needed and will follow labs.   Claudia Desanctis, MD 08/10/2021 12:08 PM

## 2021-08-11 LAB — RENAL FUNCTION PANEL
Albumin: 1.9 g/dL — ABNORMAL LOW (ref 3.5–5.0)
Anion gap: 10 (ref 5–15)
BUN: 26 mg/dL — ABNORMAL HIGH (ref 8–23)
CO2: 27 mmol/L (ref 22–32)
Calcium: 8.5 mg/dL — ABNORMAL LOW (ref 8.9–10.3)
Chloride: 96 mmol/L — ABNORMAL LOW (ref 98–111)
Creatinine, Ser: 8.66 mg/dL — ABNORMAL HIGH (ref 0.61–1.24)
GFR, Estimated: 6 mL/min — ABNORMAL LOW (ref >60)
Glucose, Bld: 114 mg/dL — ABNORMAL HIGH (ref 70–99)
Phosphorus: 2.4 mg/dL — ABNORMAL LOW (ref 2.5–4.6)
Potassium: 3.5 mmol/L (ref 3.5–5.1)
Sodium: 133 mmol/L — ABNORMAL LOW (ref 135–145)

## 2021-08-11 MED ORDER — CARVEDILOL 3.125 MG PO TABS
3.1250 mg | ORAL_TABLET | Freq: Two times a day (BID) | ORAL | Status: DC
  Administered 2021-08-11 – 2021-08-12 (×3): 3.125 mg via ORAL
  Filled 2021-08-11 (×3): qty 1

## 2021-08-11 MED ORDER — AMIODARONE HCL 200 MG PO TABS
200.0000 mg | ORAL_TABLET | Freq: Two times a day (BID) | ORAL | Status: DC
  Administered 2021-08-11 – 2021-08-12 (×3): 200 mg via ORAL
  Filled 2021-08-11 (×3): qty 1

## 2021-08-11 NOTE — Procedures (Signed)
° °  HEMODIALYSIS TREATMENT NOTE:   Uneventful 3.5 hour heparin-free treatment completed using right chest wall TDC.  Goal met: 1 liter removed without interruption in UF.  Hemodynamically stable with pulse 70s - 90s throughout session.  All blood was returned.  No changes from pre-HD assessment.   Rockwell Alexandria, RN

## 2021-08-11 NOTE — Progress Notes (Signed)
Patient had 17 beat run of vtach. Dr. Dyann Kief made aware. Will continue to monitor rhythm and rate at this time.

## 2021-08-11 NOTE — Progress Notes (Signed)
PROGRESS NOTE    Rodney Gordon  LFY:[id number] DOB: 18-Oct-1955 DOA: 08/09/2021 PCP: Neale Burly, MD    No chief complaint on file.   Brief Narrative:  Rodney Gordon is a 66 y.o. male with medical history significant of end-stage renal disease on hemodialysis, history of atrial flutter on chronic anticoagulation (Eliquis), hypertension, anemia of chronic disease, peripheral vascular disease and history of coronary artery disease; who presented to the hospital secondary to general malaise, weakness, palpitations and shortness of breath with exertion.  Patient reports last hemodialysis treatment was 07/30/2021; he denies chest pain but expressed palpitation symptoms.  No nausea, no vomiting, decreased appetite also reported.  Patient is still making urine but expressed no dysuria or hematuria.  He has not been seen any melena or hematochezia either.  Denies abdominal pain.   In the ED work-up demonstrating significant anemia with a hemoglobin of 6.9 and A. fib with RVR.  Chest x-[place] without acute cardiopulmonary process.  PCR and influenza negative.  Patient is now vaccinated against COVID.   ED Course: Type and screen has been done, patient started on Cardizem drip with improvement in controlling his heart rate, nephrology service consulted and [facility] has been called to place in the hospital for transfusion, and further evaluation and management.   Assessment & Plan: 1-atrial fibrillation with rapid ventricular response -Patient with underlying history of atrial flutter -Has responded appropriately to the use of Cardizem drip, blood transfusion and hemodialysis. -Given low ejection fraction cardiology has been contacted and recommendations provided to start midodrine and carvedilol. -Will maintained adequate electrolytes level and follow heart rate response. -Patient will be transferred to telemetry bed. -2D echo with ejection fraction of 30 to 35% by estimation, moderate left ventricular  decreased function and global hypokinesis.  Normal pulmonary systolic pressure and no significant valvular abnormalities.  2-ESRD -Appreciate nephrology consultation and assistance. -Plan is for hemodialysis again today to get back to his usual schedule.   -Close monitoring ability to tolerate dialysis treatment without experiencing elevated heart rate.  3-acute systolic heart failure -Probably driven by arrhythmia -Continue to follow daily weights, low-sodium diet and strict intake and output -Patient has been started on beta-blocker (carvedilol twice a day); following cardiology recommendation will be also started on amiodarone twice a day.  He remained stable will be discharged home 08/12/2021 with outpatient follow-up. -Volume management by hemodialysis  4-GERD -continue PPI  5-hypertension -Stable overall -For now continue treatment with carvedilol. -Continue hemodialysis.  6-history of coronary disease -No chest pain or SOB. -2D echo with new ejection fraction decreased; probably driven by arrhythmia -Global hypokinesis -Continue telemetry monitoring -Following cardiology recommendations; patient has been started on Coreg. -outpatient follow up anticipated.  7-acute on chronic anemia -No overt bleeding. - 2 units PRBCs transfused during this hospitalization -Hemoglobin has remained stable -IV iron and Epogen therapy moving 4 as per nephrology discretion.   DVT prophylaxis: Chronically on Eliquis Code Status: Full code Family Communication: No family at bedside. Disposition:   Status is: Inpatient    Consultants:  Nephrology service Cardiology  Procedures:  See below for x-[place] report 2D echo: With global hypokinesis and decreased ejection fraction to 30-35%.  Valvular disorder.  Antimicrobials:  none   Subjective: Afebrile, no chest pain, no shortness of breath, no nausea vomiting.  Reports feeling good.  No palpitations.   Objective: Vitals:    08/11/21 0730 08/11/21 1020 08/11/21 1045 08/11/21 1100  BP: 119/81 121/75 125/61 129/72  Pulse: 60 72 66 68  Resp: 12 19 20  (!) 24  Temp:      TempSrc:      SpO2: 95% 95%    Weight:  62.7 kg    Height:        Intake/Output Summary (Last 24 hours) at 08/11/2021 1117 Last data filed at 08/10/2021 1346 Gross per 24 hour  Intake --  Output 30 ml  Net -30 ml   Filed Weights   08/10/21 0500 08/11/21 0411 08/11/21 1020  Weight: 62.4 kg 62.7 kg 62.7 kg    Examination: General exam: Alert, awake, oriented x 3; in no acute distress; reports no chest pain or shortness of breath. Respiratory system: Clear to auscultation. Respiratory effort normal.  No using accessory muscle.  Good saturation on room air. Cardiovascular system:RRR. No murmurs, rubs, gallops.  On exam. Gastrointestinal system: Abdomen is nondistended, soft and nontender. No organomegaly or masses felt. Normal bowel sounds heard. Central nervous system: Alert and oriented. No focal neurological deficits. Extremities: No cyanosis or clubbing. Skin: No petechiae. Psychiatry: Judgement and insight appear normal. Mood & affect appropriate.    Data Reviewed: I have personally reviewed following labs and imaging studies  CBC: Recent Labs  Lab 08/09/21 0805 08/09/21 1935 08/10/21 0350  WBC 8.1  --  10.9*  HGB 6.9* 10.7* 10.6*  HCT 23.0* 31.4* 32.8*  MCV 106.5*  --  97.0  PLT 346  --  884    Basic Metabolic Panel: Recent Labs  Lab 08/09/21 0805 08/10/21 0350 08/11/21 0448  NA 138 135 133*  K 4.0 3.6 3.5  CL 96* 95* 96*  CO2 27 28 27   GLUCOSE 149* 99 114*  BUN 46* 20 26*  CREATININE 14.19* 7.25* 8.66*  CALCIUM 9.2 8.6* 8.5*  PHOS  --  2.3* 2.4*    GFR: Estimated Creatinine Clearance: 7.5 mL/min (A) (by C-G formula based on SCr of 8.66 mg/dL (H)).  Liver Function Tests: Recent Labs  Lab 08/09/21 0805 08/10/21 0350 08/11/21 0448  AST 22  --   --   ALT 17  --   --   ALKPHOS 88  --   --   BILITOT  2.6*  --   --   PROT 6.8  --   --   ALBUMIN 2.2* 2.2* 1.9*    CBG: No results for input(s): GLUCAP in the last 168 hours.   Recent Results (from the past 240 hour(s))  Resp Panel by RT-PCR (Flu A&B, Covid) Nasopharyngeal Swab     Status: None   Collection Time: 08/09/21  7:50 AM   Specimen: Nasopharyngeal Swab; Nasopharyngeal(NP) swabs in vial transport medium  Result Value Ref Range Status   SARS Coronavirus 2 by RT PCR NEGATIVE NEGATIVE Final    Comment: (NOTE) SARS-CoV-2 target nucleic acids are NOT DETECTED.  The SARS-CoV-2 RNA is generally detectable in upper respiratory specimens during the acute phase of infection. The lowest concentration of SARS-CoV-2 viral copies this assay can detect is 138 copies/mL. A negative result does not preclude SARS-Cov-2 infection and should not be used as the sole basis for treatment or other patient management decisions. A negative result may occur with  improper specimen collection/handling, submission of specimen other than nasopharyngeal swab, presence of viral mutation(s) within the areas targeted by this assay, and inadequate number of viral copies(<138 copies/mL). A negative result must be combined with clinical observations, patient history, and epidemiological information. The expected result is Negative.  Fact Sheet for Patients:  EntrepreneurPulse.com.au  Fact Sheet for Healthcare Providers:  IncredibleEmployment.be  This test is no t yet approved or cleared by the [place] and  has been authorized for detection and/or diagnosis of SARS-CoV-2 by FDA under an Emergency Use Authorization (EUA). This EUA will remain  in effect (meaning this test can be used) for the duration of the COVID-19 declaration under Section 564(b)(1) of the Act, 21 U.S.C.section 360bbb-3(b)(1), unless the authorization is terminated  or revoked sooner.       Influenza A by PCR NEGATIVE NEGATIVE Final    Influenza B by PCR NEGATIVE NEGATIVE Final    Comment: (NOTE) The Xpert Xpress SARS-CoV-2/FLU/RSV plus assay is intended as an aid in the diagnosis of influenza from Nasopharyngeal swab specimens and should not be used as a sole basis for treatment. Nasal washings and aspirates are unacceptable for Xpert Xpress SARS-CoV-2/FLU/RSV testing.  Fact Sheet for Patients: EntrepreneurPulse.com.au  Fact Sheet for Healthcare Providers: IncredibleEmployment.be  This test is not yet approved or cleared by the [place] FDA and has been authorized for detection and/or diagnosis of SARS-CoV-2 by FDA under an Emergency Use Authorization (EUA). This EUA will remain in effect (meaning this test can be used) for the duration of the COVID-19 declaration under Section 564(b)(1) of the Act, 21 U.S.C. section 360bbb-3(b)(1), unless the authorization is terminated or revoked.  Performed at [facility], [address] [place], [place] [address]   MRSA Next Gen by PCR, Nasal     Status: None   Collection Time: 08/09/21 12:17 PM   Specimen: Nasal Mucosa; Nasal Swab  Result Value Ref Range Status   MRSA by PCR Next Gen NOT DETECTED NOT DETECTED Final    Comment: (NOTE) The GeneXpert MRSA Assay (FDA approved for NASAL specimens only), is one component of a comprehensive MRSA colonization surveillance program. It is not intended to diagnose MRSA infection nor to guide or monitor treatment for MRSA infections. Test performance is not FDA approved in patients less than 33 years old. Performed at [facility], [address] [place], [place] [address]      Radiology Studies: ECHOCARDIOGRAM COMPLETE  Result Date: 08/09/2021    ECHOCARDIOGRAM REPORT   Patient Name:   AUGUSTEN LIPKIN Paradis Date of Exam: 08/09/2021 Medical Rec #:  [id number]     Height:       72.0 in Accession #:    [id number]    Weight:       169.8 lb Date of Birth:  January 05, 1956    BSA:          1.987 m Patient  Age:    35 years      BP:           118/86 mmHg Patient Gender: M             HR:           102 bpm. Exam Location:  [facility] Procedure: 2D Echo, Cardiac Doppler and Color Doppler Indications:    Atrial Fibrillation I48.91  History:        Patient has prior history of Echocardiogram examinations, most                 recent 02/28/2021. Aortic Valve Disease, Arrythmias:Atrial                 Fibrillation; Risk Factors:Hypertension. ESRD on hemodialysis                 ([place]) (From Hx), Palpitations, Aortic regurgitation.  Sonographer:    Alvino Chapel RCS  Referring Phys: [place]  1. Left ventricular ejection fraction, by estimation, is 30 to 35%. The left ventricle has moderately decreased function. The left ventricle demonstrates global hypokinesis. There is mild left ventricular hypertrophy. Left ventricular diastolic parameters are indeterminate.  2. Right ventricular systolic function is normal. The right ventricular size is normal. There is normal pulmonary artery systolic pressure.  3. Left atrial size was severely dilated.  4. The pericardial effusion is circumferential.  5. The MR vena contracta is 0.37 cm. . The mitral valve is abnormal. Mild to moderate mitral valve regurgitation. No evidence of mitral stenosis.  6. The tricuspid valve is abnormal. Tricuspid valve regurgitation is moderate.  7. The aortic valve is tricuspid. There is mild calcification of the aortic valve. There is mild thickening of the aortic valve. Aortic valve regurgitation is moderate. No aortic stenosis is present.  8. The inferior vena cava is normal in size with greater than 50% respiratory variability, suggesting right atrial pressure of 3 mmHg. FINDINGS  Left Ventricle: Left ventricular ejection fraction, by estimation, is 30 to 35%. The left ventricle has moderately decreased function. The left ventricle demonstrates global hypokinesis. The left ventricular internal cavity size was normal in size. There is  mild left ventricular hypertrophy. Left ventricular diastolic parameters are indeterminate. Right Ventricle: The right ventricular size is normal. Right vetricular wall thickness was not well visualized. Right ventricular systolic function is normal. There is normal pulmonary artery systolic pressure. The tricuspid regurgitant velocity is 2.56 m/s, and with an assumed right atrial pressure of 3 mmHg, the estimated right ventricular systolic pressure is 10.6 mmHg. Left Atrium: Left atrial size was severely dilated. Right Atrium: Right atrial size was normal in size. Pericardium: Trivial pericardial effusion is present. The pericardial effusion is circumferential. Mitral Valve: The MR vena contracta is 0.37 cm. The mitral valve is abnormal. There is mild thickening of the mitral valve leaflet(s). There is mild calcification of the mitral valve leaflet(s). Mild mitral annular calcification. Mild to moderate mitral valve regurgitation. No evidence of mitral valve stenosis. Tricuspid Valve: The tricuspid valve is abnormal. Tricuspid valve regurgitation is moderate . No evidence of tricuspid stenosis. Aortic Valve: The aortic valve is tricuspid. There is mild calcification of the aortic valve. There is mild thickening of the aortic valve. There is mild aortic valve annular calcification. Aortic valve regurgitation is moderate. Aortic regurgitation PHT  measures 588 msec. No aortic stenosis is present. Aortic valve mean gradient measures 3.7 mmHg. Aortic valve peak gradient measures 7.5 mmHg. Aortic valve area, by VTI measures 2.03 cm. Pulmonic Valve: The pulmonic valve was not well visualized. Pulmonic valve regurgitation is not visualized. No evidence of pulmonic stenosis. Aorta: The aortic root is normal in size and structure. Venous: The inferior vena cava is normal in size with greater than 50% respiratory variability, suggesting right atrial pressure of 3 mmHg. IAS/Shunts: No atrial level shunt detected by color  flow Doppler.  LEFT VENTRICLE PLAX 2D LVIDd:         5.30 cm LVIDs:         4.55 cm LV PW:         1.10 cm LV IVS:        1.00 cm LVOT diam:     2.00 cm LV SV:         45 LV SV Index:   23 LVOT Area:     3.14 cm  RIGHT VENTRICLE RV S prime:     10.00 cm/s TAPSE (  M-mode): 1.5 cm LEFT ATRIUM              Index        RIGHT ATRIUM           Index LA diam:        3.50 cm  1.76 cm/m   RA Area:     20.50 cm LA Vol (A2C):   101.0 ml 50.83 ml/m  RA Volume:   64.90 ml  32.66 ml/m LA Vol (A4C):   122.0 ml 61.39 ml/m LA Biplane Vol: 119.0 ml 59.88 ml/m  AORTIC VALVE AV Area (Vmax):    2.13 cm AV Area (Vmean):   2.16 cm AV Area (VTI):     2.03 cm AV Vmax:           136.57 cm/s AV Vmean:          90.434 cm/s AV VTI:            0.223 m AV Peak Grad:      7.5 mmHg AV Mean Grad:      3.7 mmHg LVOT Vmax:         92.43 cm/s LVOT Vmean:        62.133 cm/s LVOT VTI:          0.144 m LVOT/AV VTI ratio: 0.65 AI PHT:            588 msec  AORTA Ao Root diam: 3.40 cm MITRAL VALVE                  TRICUSPID VALVE MV Area (PHT): 3.72 cm       TR Peak grad:   26.2 mmHg MV Decel Time: 204 msec       TR Vmax:        256.00 cm/s MR Peak grad:    92.2 mmHg MR Mean grad:    58.0 mmHg    SHUNTS MR Vmax:         480.00 cm/s  Systemic VTI:  0.14 m MR Vmean:        352.0 cm/s   Systemic Diam: 2.00 cm MR PISA:         2.26 cm MR PISA Eff ROA: 11 mm MR PISA Radius:  0.60 cm MV E velocity: 121.00 cm/s Carlyle Dolly MD Electronically signed by Carlyle Dolly MD Signature Date/Time: 08/09/2021/1:20:21 PM    Final         Scheduled Meds:  sodium chloride   Intravenous Once   amiodarone  200 mg Oral BID   apixaban  5 mg Oral BID   carvedilol  3.125 mg Oral BID WC   Chlorhexidine Gluconate Cloth  6 each Topical Daily   Chlorhexidine Gluconate Cloth  6 each Topical Q0600   Chlorhexidine Gluconate Cloth  6 each Topical Q0600   pantoprazole  40 mg Oral Daily   Continuous Infusions:  sodium chloride     sodium chloride        LOS: 2 days    Barton Dubois, MD [facility]   To contact the attending provider between 7A-7P or the covering provider during after hours 7P-7A, please log into the web site [email or web address] and access using universal [place] password for that web site. If you do not have the password, please call the hospital operator.  08/11/2021, 11:17 AM

## 2021-08-12 DIAGNOSIS — I5021 Acute systolic (congestive) heart failure: Secondary | ICD-10-CM

## 2021-08-12 LAB — CBC
HCT: 30.2 % — ABNORMAL LOW (ref 39.0–52.0)
Hemoglobin: 10.3 g/dL — ABNORMAL LOW (ref 13.0–17.0)
MCH: 33.2 pg (ref 26.0–34.0)
MCHC: 34.1 g/dL (ref 30.0–36.0)
MCV: 97.4 fL (ref 80.0–100.0)
Platelets: 290 10*3/uL (ref 150–400)
RBC: 3.1 MIL/uL — ABNORMAL LOW (ref 4.22–5.81)
RDW: 16.8 % — ABNORMAL HIGH (ref 11.5–15.5)
WBC: 8.6 10*3/uL (ref 4.0–10.5)
nRBC: 0 % (ref 0.0–0.2)

## 2021-08-12 LAB — RENAL FUNCTION PANEL
Albumin: 1.8 g/dL — ABNORMAL LOW (ref 3.5–5.0)
Anion gap: 7 (ref 5–15)
BUN: 15 mg/dL (ref 8–23)
CO2: 28 mmol/L (ref 22–32)
Calcium: 8.3 mg/dL — ABNORMAL LOW (ref 8.9–10.3)
Chloride: 98 mmol/L (ref 98–111)
Creatinine, Ser: 4.97 mg/dL — ABNORMAL HIGH (ref 0.61–1.24)
GFR, Estimated: 12 mL/min — ABNORMAL LOW (ref >60)
Glucose, Bld: 115 mg/dL — ABNORMAL HIGH (ref 70–99)
Phosphorus: 1.8 mg/dL — ABNORMAL LOW (ref 2.5–4.6)
Potassium: 3.6 mmol/L (ref 3.5–5.1)
Sodium: 133 mmol/L — ABNORMAL LOW (ref 135–145)

## 2021-08-12 MED ORDER — PANTOPRAZOLE SODIUM 40 MG PO TBEC: 40.0000 mg | DELAYED_RELEASE_TABLET | Freq: Every day | ORAL | 1 refills | Status: AC

## 2021-08-12 MED ORDER — CARVEDILOL 3.125 MG PO TABS: 3.1250 mg | ORAL_TABLET | Freq: Two times a day (BID) | ORAL | 1 refills | Status: DC

## 2021-08-12 MED ORDER — AMIODARONE HCL 200 MG PO TABS: 200.0000 mg | ORAL_TABLET | Freq: Two times a day (BID) | ORAL | 1 refills | Status: DC

## 2021-08-12 NOTE — Discharge Summary (Signed)
Physician Discharge Summary  Rodney Gordon MGQ:[id number] DOB: [birth year] DOA: 08/09/2021  PCP: Neale Burly, MD  Admit date: 08/09/2021 Discharge date: 08/12/2021  Time spent: 35 minutes  Recommendations for Outpatient Follow-up:  Repeat CBC to follow hemoglobin trend and instability. Make sure patient has follow-up with cardiology service for further evaluation and management of acute systolic heart failure and further treatment of underlying atrial flora/fibrillation.  Discharge Diagnoses:  Atrial fibrillation with rapid ventricular response (HCC) History of atrial flutter Acute systolic heart failure (ejection fraction 30%)  Acute on chronic anemia  End-stage renal disease Gastroesophageal reflux disease History of nonobstructive coronary artery disease Hypertension   Discharge Condition: Stable and improved.  Discharged home with instruction to follow-up with PCP and cardiology service as an outpatient.  CODE STATUS: Full code.  Diet recommendation: Heart healthy/low-sodium diet.  Filed Weights   08/11/21 0411 08/11/21 1020 08/12/21 0500  Weight: 62.7 kg 62.7 kg 62.3 kg    History of present illness:  Rodney Gordon is a 66 y.o. male with medical history significant of end-stage renal disease on hemodialysis, history of atrial flutter on chronic anticoagulation (Eliquis), hypertension, anemia of chronic disease, peripheral vascular disease and history of coronary artery disease; who presented to the hospital secondary to general malaise, weakness, palpitations and shortness of breath with exertion.  Patient reports last hemodialysis treatment was 07/30/2021; he denies chest pain but expressed palpitation symptoms.  No nausea, no vomiting, decreased appetite also reported.  Patient is still making urine but expressed no dysuria or hematuria.  He has not been seen any melena or hematochezia either.  Denies abdominal pain.   In the ED work-up demonstrating significant anemia with  a hemoglobin of 6.9 and A. fib with RVR.  Chest x-[place] without acute cardiopulmonary process.  PCR and influenza negative.  Patient is now vaccinated against COVID.   ED Course: Type and screen has been done, patient started on Cardizem drip with improvement in controlling his heart rate, nephrology service consulted and [facility] has been called to place in the hospital for transfusion, and further evaluation and management.  Hospital Course:  1-atrial fibrillation with rapid ventricular response -Patient with underlying history of atrial flutter -Responded appropriately to the use of Cardizem drip, blood transfusion and hemodialysis. -Given low ejection fraction cardiology has been contacted and recommendations provided to start midodrine and carvedilol. -Continue to monitor closely and maintain adequate electrolytes level. -Patient discharged in a stable condition with instruction to follow-up with cardiology service as an outpatient. -2D echo with ejection fraction of 30 to 35% by estimation, moderate left ventricular decreased function and global hypokinesis.  Normal pulmonary systolic pressure and no significant valvular abnormalities. -Eliquis for secondary prevention.   2-ESRD -Appreciate nephrology consultation and assistance. -Last hemodialysis treatment well-tolerated without events on 08/11/2021; next treatment as an outpatient on patient's regular schedule 08/15/21 (Tuesday, Thursday and Saturday).  3-acute systolic heart failure -Probably driven by arrhythmia -Continue to follow daily weights, low-sodium diet and adequate hydration. -Patient has been started on beta-blocker (carvedilol twice a day); following cardiology recommendation also started on amiodarone twice a day.  He remained stable and has been discharged with instructions to follow up with cardiology service in 10 days.  -continue Volume management by hemodialysis   4-GERD -continue PPI   5-hypertension -Stable and well  controlled. -For now continue treatment with carvedilol. -Continue hemodialysis. -patient advised to follow heart healthy diet.   6-history of coronary disease (non-obstructive) -No chest pain or SOB. -2D echo with  new ejection fraction decreased; probably driven by arrhythmia; Global hypokinesis and not significant valvular disorder appreciated. -Following cardiology recommendations; patient has been started on Coreg and will have close outpatient follow up..   7-acute on chronic anemia -No overt bleeding. -2 units PRBCs transfused during this hospitalization -Hemoglobin has remained stable; at discharge Hgb level  -IV iron and Epogen therapy as per nephrology discretion.    Procedures: See below for x-[place] reports Inpatient hemodialysis. 2D echo: With global hypokinesis and decreased ejection fraction to 30-35%.  Valvular disorder.    Consultations: Cardiology service curbside (Dr. Johnsie Cancel) recommendations given to initiate patient on carvedilol and amiodarone with close outpatient follow-up. Nephrology service  Discharge Exam: Vitals:   08/11/21 2128 08/12/21 0426  BP: (!) 125/93 120/76  Pulse: 93 98  Resp: 17 16  Temp: 98.1 F (36.7 C) (!) 97.3 F (36.3 C)  SpO2: 97% 99%    General: Afebrile, no chest pain, no nausea, no vomiting, no palpitation.  Hemodynamically stable and feeling ready to go home. Cardiovascular: S1 and S2 appreciated; no rubs, no gallops, no murmurs or JVD on exam.  Rate controlled atrial fibrillation appreciated on telemetry. Respiratory: Good air movement bilaterally, no using accessory muscles, good saturation on room air.  No wheezing or crackles on exam. Abdomen: Soft, nontender, positive bowel sounds, no distention appreciated. Extremities: No cyanosis or clubbing, no edema.  Discharge Instructions    Allergies as of 08/12/2021   No Known Allergies      Medication List     STOP taking these medications    atenolol-chlorthalidone  50-25 MG tablet Commonly known as: TENORETIC   diltiazem 240 MG 24 hr capsule Commonly known as: CARDIZEM CD   diltiazem 60 MG tablet Commonly known as: CARDIZEM   furosemide 80 MG tablet Commonly known as: LASIX   methocarbamol 500 MG tablet Commonly known as: ROBAXIN   oxyCODONE-acetaminophen 5-325 MG tablet Commonly known as: PERCOCET/ROXICET       TAKE these medications    acetaminophen 650 MG CR tablet Commonly known as: TYLENOL Take 650 mg by mouth daily as needed for pain.   amiodarone 200 MG tablet Commonly known as: PACERONE Take 1 tablet (200 mg total) by mouth 2 (two) times daily.   apixaban 5 MG Tabs tablet Commonly known as: Eliquis Take 1 tablet (5 mg total) by mouth 2 (two) times daily. What changed: how much to take   carvedilol 3.125 MG tablet Commonly known as: COREG Take 1 tablet (3.125 mg total) by mouth 2 (two) times daily with a meal.   lidocaine-prilocaine cream Commonly known as: EMLA Apply 1 application topically as needed (prior to dialysis).   nitroGLYCERIN 0.4 MG SL tablet Commonly known as: NITROSTAT Place 1 tablet (0.4 mg total) under the tongue as directed. What changed:  when to take this reasons to take this   pantoprazole 40 MG tablet Commonly known as: PROTONIX Take 1 tablet (40 mg total) by mouth daily.   sodium bicarbonate 650 MG tablet Take 1,300 mg by mouth 3 (three) times daily.   tamsulosin 0.4 MG Caps capsule Commonly known as: FLOMAX Take 1 capsule by mouth daily as needed.       No Known Allergies  Follow-up Information     Neale Burly, MD. Schedule an appointment as soon as possible for a visit in 2 week(s).   Specialty: Internal Medicine Contact information: [address] [place] [address] [phone]         Domenic Polite,  Aloha Gell, MD. Schedule an appointment as soon as possible for a visit in 10 day(s).   Specialty: Cardiology Contact information: [place]  [id number] [phone]                The results of significant diagnostics from this hospitalization (including imaging, microbiology, ancillary and laboratory) are listed below for reference.    Significant Diagnostic Studies: DG Chest Portable 1 View  Result Date: 08/09/2021 CLINICAL DATA:  Weakness EXAM: PORTABLE CHEST 1 VIEW COMPARISON:  Chest x-Kretschmer dated June 08, 2021 FINDINGS: Cardiac and mediastinal contours are unchanged. Right central venous catheter with tip positioned near the superior cavoatrial junction. Left basilar atelectasis. No large pleural effusion or evidence of pneumothorax. IMPRESSION: No active disease. Electronically Signed   By: Yetta Glassman M.D.   On: 08/09/2021 08:37   ECHOCARDIOGRAM COMPLETE  Result Date: 08/09/2021    ECHOCARDIOGRAM REPORT   Patient Name:   Rodney Gordon Date of Exam: 08/09/2021 Medical Rec #:  [id number]     Height:       72.0 in Accession #:    [id number]    Weight:       169.8 lb Date of Birth:  [birth year]    BSA:          1.987 m Patient Age:    23 years      BP:           118/86 mmHg Patient Gender: M             HR:           102 bpm. Exam Location:  [facility] Procedure: 2D Echo, Cardiac Doppler and Color Doppler Indications:    Atrial Fibrillation I48.91  History:        Patient has prior history of Echocardiogram examinations, most                 recent 02/28/2021. Aortic Valve Disease, Arrythmias:Atrial                 Fibrillation; Risk Factors:Hypertension. ESRD on hemodialysis                 ([place]) (From Hx), Palpitations, Aortic regurgitation.  Sonographer:    Alvino Chapel RCS Referring Phys: [place]  1. Left ventricular ejection fraction, by estimation, is 30 to 35%. The left ventricle has moderately decreased function. The left ventricle demonstrates global hypokinesis. There is mild left ventricular hypertrophy. Left ventricular diastolic parameters are indeterminate.  2. Right ventricular systolic  function is normal. The right ventricular size is normal. There is normal pulmonary artery systolic pressure.  3. Left atrial size was severely dilated.  4. The pericardial effusion is circumferential.  5. The MR vena contracta is 0.37 cm. . The mitral valve is abnormal. Mild to moderate mitral valve regurgitation. No evidence of mitral stenosis.  6. The tricuspid valve is abnormal. Tricuspid valve regurgitation is moderate.  7. The aortic valve is tricuspid. There is mild calcification of the aortic valve. There is mild thickening of the aortic valve. Aortic valve regurgitation is moderate. No aortic stenosis is present.  8. The inferior vena cava is normal in size with greater than 50% respiratory variability, suggesting right atrial pressure of 3 mmHg. FINDINGS  Left Ventricle: Left ventricular ejection fraction, by estimation, is 30 to 35%. The left ventricle has moderately decreased function. The left ventricle demonstrates global hypokinesis. The left ventricular internal cavity size  was normal in size. There is mild left ventricular hypertrophy. Left ventricular diastolic parameters are indeterminate. Right Ventricle: The right ventricular size is normal. Right vetricular wall thickness was not well visualized. Right ventricular systolic function is normal. There is normal pulmonary artery systolic pressure. The tricuspid regurgitant velocity is 2.56 m/s, and with an assumed right atrial pressure of 3 mmHg, the estimated right ventricular systolic pressure is 50.5 mmHg. Left Atrium: Left atrial size was severely dilated. Right Atrium: Right atrial size was normal in size. Pericardium: Trivial pericardial effusion is present. The pericardial effusion is circumferential. Mitral Valve: The MR vena contracta is 0.37 cm. The mitral valve is abnormal. There is mild thickening of the mitral valve leaflet(s). There is mild calcification of the mitral valve leaflet(s). Mild mitral annular calcification. Mild to  moderate mitral valve regurgitation. No evidence of mitral valve stenosis. Tricuspid Valve: The tricuspid valve is abnormal. Tricuspid valve regurgitation is moderate . No evidence of tricuspid stenosis. Aortic Valve: The aortic valve is tricuspid. There is mild calcification of the aortic valve. There is mild thickening of the aortic valve. There is mild aortic valve annular calcification. Aortic valve regurgitation is moderate. Aortic regurgitation PHT  measures 588 msec. No aortic stenosis is present. Aortic valve mean gradient measures 3.7 mmHg. Aortic valve peak gradient measures 7.5 mmHg. Aortic valve area, by VTI measures 2.03 cm. Pulmonic Valve: The pulmonic valve was not well visualized. Pulmonic valve regurgitation is not visualized. No evidence of pulmonic stenosis. Aorta: The aortic root is normal in size and structure. Venous: The inferior vena cava is normal in size with greater than 50% respiratory variability, suggesting right atrial pressure of 3 mmHg. IAS/Shunts: No atrial level shunt detected by color flow Doppler.  LEFT VENTRICLE PLAX 2D LVIDd:         5.30 cm LVIDs:         4.55 cm LV PW:         1.10 cm LV IVS:        1.00 cm LVOT diam:     2.00 cm LV SV:         45 LV SV Index:   23 LVOT Area:     3.14 cm  RIGHT VENTRICLE RV S prime:     10.00 cm/s TAPSE (M-mode): 1.5 cm LEFT ATRIUM              Index        RIGHT ATRIUM           Index LA diam:        3.50 cm  1.76 cm/m   RA Area:     20.50 cm LA Vol (A2C):   101.0 ml 50.83 ml/m  RA Volume:   64.90 ml  32.66 ml/m LA Vol (A4C):   122.0 ml 61.39 ml/m LA Biplane Vol: 119.0 ml 59.88 ml/m  AORTIC VALVE AV Area (Vmax):    2.13 cm AV Area (Vmean):   2.16 cm AV Area (VTI):     2.03 cm AV Vmax:           136.57 cm/s AV Vmean:          90.434 cm/s AV VTI:            0.223 m AV Peak Grad:      7.5 mmHg AV Mean Grad:      3.7 mmHg LVOT Vmax:         92.43 cm/s LVOT Vmean:  62.133 cm/s LVOT VTI:          0.144 m LVOT/AV VTI ratio: 0.65 AI  PHT:            588 msec  AORTA Ao Root diam: 3.40 cm MITRAL VALVE                  TRICUSPID VALVE MV Area (PHT): 3.72 cm       TR Peak grad:   26.2 mmHg MV Decel Time: 204 msec       TR Vmax:        256.00 cm/s MR Peak grad:    92.2 mmHg MR Mean grad:    58.0 mmHg    SHUNTS MR Vmax:         480.00 cm/s  Systemic VTI:  0.14 m MR Vmean:        352.0 cm/s   Systemic Diam: 2.00 cm MR PISA:         2.26 cm MR PISA Eff ROA: 11 mm MR PISA Radius:  0.60 cm MV E velocity: 121.00 cm/s Carlyle Dolly MD Electronically signed by Carlyle Dolly MD Signature Date/Time: 08/09/2021/1:20:21 PM    Final     Microbiology: Recent Results (from the past 240 hour(s))  Resp Panel by RT-PCR (Flu A&B, Covid) Nasopharyngeal Swab     Status: None   Collection Time: 08/09/21  7:50 AM   Specimen: Nasopharyngeal Swab; Nasopharyngeal(NP) swabs in vial transport medium  Result Value Ref Range Status   SARS Coronavirus 2 by RT PCR NEGATIVE NEGATIVE Final    Comment: (NOTE) SARS-CoV-2 target nucleic acids are NOT DETECTED.  The SARS-CoV-2 RNA is generally detectable in upper respiratory specimens during the acute phase of infection. The lowest concentration of SARS-CoV-2 viral copies this assay can detect is 138 copies/mL. A negative result does not preclude SARS-Cov-2 infection and should not be used as the sole basis for treatment or other patient management decisions. A negative result may occur with  improper specimen collection/handling, submission of specimen other than nasopharyngeal swab, presence of viral mutation(s) within the areas targeted by this assay, and inadequate number of viral copies(<138 copies/mL). A negative result must be combined with clinical observations, patient history, and epidemiological information. The expected result is Negative.  Fact Sheet for Patients:  EntrepreneurPulse.com.au  Fact Sheet for Healthcare Providers:   IncredibleEmployment.be  This test is no t yet approved or cleared by the [place] FDA and  has been authorized for detection and/or diagnosis of SARS-CoV-2 by FDA under an Emergency Use Authorization (EUA). This EUA will remain  in effect (meaning this test can be used) for the duration of the COVID-19 declaration under Section 564(b)(1) of the Act, 21 U.S.C.section 360bbb-3(b)(1), unless the authorization is terminated  or revoked sooner.       Influenza A by PCR NEGATIVE NEGATIVE Final   Influenza B by PCR NEGATIVE NEGATIVE Final    Comment: (NOTE) The Xpert Xpress SARS-CoV-2/FLU/RSV plus assay is intended as an aid in the diagnosis of influenza from Nasopharyngeal swab specimens and should not be used as a sole basis for treatment. Nasal washings and aspirates are unacceptable for Xpert Xpress SARS-CoV-2/FLU/RSV testing.  Fact Sheet for Patients: EntrepreneurPulse.com.au  Fact Sheet for Healthcare Providers: IncredibleEmployment.be  This test is not yet approved or cleared by the [place] FDA and has been authorized for detection and/or diagnosis of SARS-CoV-2 by FDA under an Emergency Use Authorization (EUA). This EUA will remain in effect (meaning this test  can be used) for the duration of the COVID-19 declaration under Section 564(b)(1) of the Act, 21 U.S.C. section 360bbb-3(b)(1), unless the authorization is terminated or revoked.  Performed at [facility], [address] [place], [place] [address]   MRSA Next Gen by PCR, Nasal     Status: None   Collection Time: 08/09/21 12:17 PM   Specimen: Nasal Mucosa; Nasal Swab  Result Value Ref Range Status   MRSA by PCR Next Gen NOT DETECTED NOT DETECTED Final    Comment: (NOTE) The GeneXpert MRSA Assay (FDA approved for NASAL specimens only), is one component of a comprehensive MRSA colonization surveillance program. It is not intended to diagnose MRSA  infection nor to guide or monitor treatment for MRSA infections. Test performance is not FDA approved in patients less than 65 years old. Performed at [facility], [address], [place], [place] [address]      Labs: Basic Metabolic Panel: Recent Labs  Lab 08/09/21 0805 08/10/21 0350 08/11/21 0448  NA 138 135 133*  K 4.0 3.6 3.5  CL 96* 95* 96*  CO2 27 28 27   GLUCOSE 149* 99 114*  BUN 46* 20 26*  CREATININE 14.19* 7.25* 8.66*  CALCIUM 9.2 8.6* 8.5*  PHOS  --  2.3* 2.4*   Liver Function Tests: Recent Labs  Lab 08/09/21 0805 08/10/21 0350 08/11/21 0448  AST 22  --   --   ALT 17  --   --   ALKPHOS 88  --   --   BILITOT 2.6*  --   --   PROT 6.8  --   --   ALBUMIN 2.2* 2.2* 1.9*   Recent Labs  Lab 08/09/21 0805  LIPASE 24   CBC: Recent Labs  Lab 08/09/21 0805 08/09/21 1935 08/10/21 0350  WBC 8.1  --  10.9*  HGB 6.9* 10.7* 10.6*  HCT 23.0* 31.4* 32.8*  MCV 106.5*  --  97.0  PLT 346  --  321   Signed:  Barton Dubois MD.  [facility] 08/12/2021, 6:08 AM

## 2021-08-12 NOTE — Plan of Care (Signed)

## 2021-08-12 NOTE — Progress Notes (Signed)
Went over discharge paperwork with patient and removed IV from Left [facility] as it was bleeding, patients sister is on the way from [place], [place] to pick him up . Patient is aware and will remove his remaining IV once she is here . Patient verbalized understanding of medication and follow up appointments

## 2021-08-14 DIAGNOSIS — N186 End stage renal disease: Secondary | ICD-10-CM | POA: Diagnosis not present

## 2021-08-14 DIAGNOSIS — D631 Anemia in chronic kidney disease: Secondary | ICD-10-CM | POA: Diagnosis not present

## 2021-08-14 DIAGNOSIS — N2581 Secondary hyperparathyroidism of renal origin: Secondary | ICD-10-CM | POA: Diagnosis not present

## 2021-08-14 DIAGNOSIS — D509 Iron deficiency anemia, unspecified: Secondary | ICD-10-CM | POA: Diagnosis not present

## 2021-08-14 DIAGNOSIS — Z992 Dependence on renal dialysis: Secondary | ICD-10-CM | POA: Diagnosis not present

## 2021-08-15 DIAGNOSIS — N186 End stage renal disease: Secondary | ICD-10-CM | POA: Diagnosis not present

## 2021-08-15 DIAGNOSIS — Z992 Dependence on renal dialysis: Secondary | ICD-10-CM | POA: Diagnosis not present

## 2021-08-15 DIAGNOSIS — D509 Iron deficiency anemia, unspecified: Secondary | ICD-10-CM | POA: Diagnosis not present

## 2021-08-15 DIAGNOSIS — N2581 Secondary hyperparathyroidism of renal origin: Secondary | ICD-10-CM | POA: Diagnosis not present

## 2021-08-15 DIAGNOSIS — D631 Anemia in chronic kidney disease: Secondary | ICD-10-CM | POA: Diagnosis not present

## 2021-08-16 DIAGNOSIS — N2581 Secondary hyperparathyroidism of renal origin: Secondary | ICD-10-CM | POA: Diagnosis not present

## 2021-08-16 DIAGNOSIS — N186 End stage renal disease: Secondary | ICD-10-CM | POA: Diagnosis not present

## 2021-08-16 DIAGNOSIS — D631 Anemia in chronic kidney disease: Secondary | ICD-10-CM | POA: Diagnosis not present

## 2021-08-16 DIAGNOSIS — D509 Iron deficiency anemia, unspecified: Secondary | ICD-10-CM | POA: Diagnosis not present

## 2021-08-16 DIAGNOSIS — Z992 Dependence on renal dialysis: Secondary | ICD-10-CM | POA: Diagnosis not present

## 2021-08-18 DIAGNOSIS — D509 Iron deficiency anemia, unspecified: Secondary | ICD-10-CM | POA: Diagnosis not present

## 2021-08-18 DIAGNOSIS — Z992 Dependence on renal dialysis: Secondary | ICD-10-CM | POA: Diagnosis not present

## 2021-08-18 DIAGNOSIS — N2581 Secondary hyperparathyroidism of renal origin: Secondary | ICD-10-CM | POA: Diagnosis not present

## 2021-08-18 DIAGNOSIS — N186 End stage renal disease: Secondary | ICD-10-CM | POA: Diagnosis not present

## 2021-08-18 DIAGNOSIS — D631 Anemia in chronic kidney disease: Secondary | ICD-10-CM | POA: Diagnosis not present

## 2021-08-20 NOTE — Progress Notes (Deleted)
[facility] OF [place]  ASSESSMENT / PLAN: Rodney Gordon is a 66 y.o. *** handed male in need of permanent hemodialysis access. I reviewed options for dialysis in detail with the patient. I counseled the patient that dialysis access requires surveillance and periodic maintenance. Plan to proceed with ***.    CHIEF COMPLAINT: ***  HISTORY OF PRESENT ILLNESS: Rodney Gordon is a 66 y.o. male ***  VASCULAR SURGICAL HISTORY: ***  VASCULAR RISK FACTORS: {FINDINGS; POSITIVE NEGATIVE:[id number]} history of stroke / transient ischemic attack. {FINDINGS; POSITIVE NEGATIVE:[id number]} history of coronary artery disease. *** history of PCI. *** history of CABG.  {FINDINGS; POSITIVE NEGATIVE:[id number]} history of diabetes mellitus. Last A1c ***. {FINDINGS; POSITIVE NEGATIVE:[id number]} history of smoking. *** actively smoking. {FINDINGS; POSITIVE NEGATIVE:[id number]} history of hypertension. *** drug regimen with *** control. {FINDINGS; POSITIVE NEGATIVE:[id number]} history of chronic kidney disease.  Last GFR ***. CKD {stage:30421363}. {FINDINGS; POSITIVE NEGATIVE:[id number]} history of chronic obstructive pulmonary disease, treated with ***.  FUNCTIONAL STATUS: ECOG performance status: {findings; ecog performance status:31780} Ambulatory status: {TNHAmbulation:25868}  Past Medical History:  Diagnosis Date   Anemia of chronic disease    Aortic insufficiency    Mild to moderate   Coronary atherosclerosis of native coronary artery    Nonobstructive at catheterization 2004   ESRD on hemodialysis [facility])    [name]   Essential hypertension    Peripheral vascular disease [facility])     Past Surgical History:  Procedure Laterality Date   A/V FISTULAGRAM Right 09/05/2017   Procedure: A/V FISTULAGRAM;  Surgeon: Angelia Mould, MD;  Location: [place] [facility];  Service: Cardiovascular;  Laterality: Right;   AV FISTULA PLACEMENT Right 12/17/2016   Procedure:  ARTERIOVENOUS (AV) FISTULA CREATION- RIGHT BRACHIOCEPHALIC;  Surgeon: Elam Dutch, MD;  Location: [place] OR;  Service: Vascular;  Laterality: Right;   CAPD INSERTION N/A 11/03/2017   Procedure: LAPAROSCOPIC INSERTION CONTINUOUS AMBULATORY PERITONEAL DIALYSIS  (CAPD) CATHETER;  Surgeon: Clovis Riley, MD;  Location: [place];  Service: General;  Laterality: N/A;   CARDIAC CATHETERIZATION     [facility]   IR DIALY SHUNT INTRO [place] W/IMG RIGHT Right 04/24/2017   IR US GUIDE VASC ACCESS RIGHT  04/24/2017   PERIPHERAL VASCULAR BALLOON ANGIOPLASTY Right 09/05/2017   Procedure: PERIPHERAL VASCULAR BALLOON ANGIOPLASTY;  Surgeon: Angelia Mould, MD;  Location: [place] CV LAB;  Service: Cardiovascular;  Laterality: Right;   PROSTATE SURGERY     TIBIA FRACTURE SURGERY     Left    Family History  Problem Relation Age of Onset   Cancer Other    Coronary artery disease Other     Social History   Socioeconomic History   Marital status: Single    Spouse name: Not on file   Number of children: Not on file   Years of education: Not on file   Highest education level: Not on file  Occupational History   Not on file  Tobacco Use   Smoking status: Former    Packs/day: 1.00    Years: 25.00    Pack years: 25.00    Types: Cigarettes    Start date: 07/07/1981    Quit date: 07/22/1998    Years since quitting: 23.0   Smokeless tobacco: Never  Vaping Use   Vaping Use: Never used  Substance and Sexual Activity   Alcohol use: No    Alcohol/week: 0.0 standard drinks   Drug use: No   Sexual activity: Not on file  Other Topics Concern  Not on file  Social History Narrative   Not on file   Social Determinants of Health   Financial Resource Strain: Not on file  Food Insecurity: Not on file  Transportation Needs: Not on file  Physical Activity: Not on file  Stress: Not on file  Social Connections: Not on file  Intimate Partner Violence: Not on file    No Known  Allergies  Current Outpatient Medications  Medication Sig Dispense Refill   acetaminophen (TYLENOL) 650 MG CR tablet Take 650 mg by mouth daily as needed for pain.     amiodarone (PACERONE) 200 MG tablet Take 1 tablet (200 mg total) by mouth 2 (two) times daily. 60 tablet 1   apixaban (ELIQUIS) 5 MG TABS tablet Take 1 tablet (5 mg total) by mouth 2 (two) times daily. (Patient taking differently: Take 2.5 mg by mouth 2 (two) times daily.) 60 tablet 0   carvedilol (COREG) 3.125 MG tablet Take 1 tablet (3.125 mg total) by mouth 2 (two) times daily with a meal. 60 tablet 1   lidocaine-prilocaine (EMLA) cream Apply 1 application topically as needed (prior to dialysis).      nitroGLYCERIN (NITROSTAT) 0.4 MG SL tablet Place 1 tablet (0.4 mg total) under the tongue as directed. (Patient taking differently: Place 0.4 mg under the tongue every 5 (five) minutes as needed for chest pain.) 25 tablet 2   pantoprazole (PROTONIX) 40 MG tablet Take 1 tablet (40 mg total) by mouth daily. 30 tablet 1   sodium bicarbonate 650 MG tablet Take 1,300 mg by mouth 3 (three) times daily.      tamsulosin (FLOMAX) 0.4 MG CAPS capsule Take 1 capsule by mouth daily as needed.     No current facility-administered medications for this visit.    REVIEW OF SYSTEMS:  [X]  denotes positive finding, [ ]  denotes negative finding Cardiac  Comments:  Chest pain or chest pressure: ***   Shortness of breath upon exertion:    Short of breath when lying flat:    Irregular heart rhythm:        Vascular    Pain in calf, thigh, or hip brought on by ambulation:    Pain in feet at night that wakes you up from your sleep:     Blood clot in your veins:    Leg swelling:         Pulmonary    Oxygen at home:    Productive cough:     Wheezing:         Neurologic    Sudden weakness in arms or legs:     Sudden numbness in arms or legs:     Sudden onset of difficulty speaking or slurred speech:    Temporary loss of vision in one eye:      Problems with dizziness:         Gastrointestinal    Blood in stool:     Vomited blood:         Genitourinary    Burning when urinating:     Blood in urine:        Psychiatric    Major depression:         Hematologic    Bleeding problems:    Problems with blood clotting too easily:        Skin    Rashes or ulcers:        Constitutional    Fever or chills:      PHYSICAL EXAM There were no  vitals filed for this visit.  Constitutional: *** appearing. *** distress. Appears *** nourished.  Neurologic: CN ***. *** focal findings. *** sensory loss. Psychiatric: *** Mood and affect symmetric and appropriate. Eyes: *** No icterus. No conjunctival pallor. Ears, nose, throat: *** mucous membranes moist. Midline trachea.  Cardiac: *** rate and rhythm.  Respiratory: *** unlabored. Abdominal: *** soft, non-tender, non-distended.  Peripheral vascular: *** Extremity: *** edema. *** cyanosis. *** pallor.  Skin: *** gangrene. *** ulceration.  Lymphatic: *** Stemmer's sign. *** palpable lymphadenopathy.  PERTINENT LABORATORY AND RADIOLOGIC DATA  Most recent CBC CBC Latest Ref Rng & Units 08/12/2021 08/10/2021 08/09/2021  WBC 4.0 - 10.5 K/uL 8.6 10.9(H) -  Hemoglobin 13.0 - 17.0 g/dL 10.3(L) 10.6(L) 10.7(L)  Hematocrit 39.0 - 52.0 % 30.2(L) 32.8(L) 31.4(L)  Platelets 150 - 400 K/uL 290 321 -     Most recent CMP CMP Latest Ref Rng & Units 08/12/2021 08/11/2021 08/10/2021  Glucose 70 - 99 mg/dL 115(H) 114(H) 99  BUN 8 - 23 mg/dL 15 26(H) 20  Creatinine 0.61 - 1.24 mg/dL 4.97(H) 8.66(H) 7.25(H)  Sodium 135 - 145 mmol/L 133(L) 133(L) 135  Potassium 3.5 - 5.1 mmol/L 3.6 3.5 3.6  Chloride 98 - 111 mmol/L 98 96(L) 95(L)  CO2 22 - 32 mmol/L 28 27 28   Calcium 8.9 - 10.3 mg/dL 8.3(L) 8.5(L) 8.6(L)  Total Protein 6.5 - 8.1 g/dL - - -  Total Bilirubin 0.3 - 1.2 mg/dL - - -  Alkaline Phos 38 - 126 U/L - - -  AST 15 - 41 U/L - - -  ALT 0 - 44 U/L - - -    Renal function Estimated  Creatinine Clearance: 13.1 mL/min (A) (by C-G formula based on SCr of 4.97 mg/dL (H)).  Hgb A1c MFr Bld (%)  Date Value  05/08/2021 <4.2 (L)    No results found for: LDLCALC, LDLC, HIRISKLDL, POCLDL, LDLDIRECT, REALLDLC, TOTLDLC   Vascular Imaging: ***  Neysha Criado N. Stanford Breed, MD [facility] Phone Number: [phone] 08/20/2021 8:05 PM  Total time spent on preparing this encounter including chart review, data review, collecting history, examining the patient, coordinating care for this {tnhtimebilling:26202}  Portions of this report may have been transcribed using voice recognition software.  Every effort has been made to ensure accuracy; however, inadvertent computerized transcription errors may still be present.

## 2021-08-21 ENCOUNTER — Encounter (HOSPITAL_COMMUNITY): Payer: Medicare Other

## 2021-08-21 ENCOUNTER — Other Ambulatory Visit (HOSPITAL_COMMUNITY): Payer: Medicare Other

## 2021-08-21 ENCOUNTER — Ambulatory Visit: Payer: Medicare Other | Admitting: Vascular Surgery

## 2021-08-21 DIAGNOSIS — D509 Iron deficiency anemia, unspecified: Secondary | ICD-10-CM | POA: Diagnosis not present

## 2021-08-21 DIAGNOSIS — Z992 Dependence on renal dialysis: Secondary | ICD-10-CM | POA: Diagnosis not present

## 2021-08-21 DIAGNOSIS — N2581 Secondary hyperparathyroidism of renal origin: Secondary | ICD-10-CM | POA: Diagnosis not present

## 2021-08-21 DIAGNOSIS — N186 End stage renal disease: Secondary | ICD-10-CM | POA: Diagnosis not present

## 2021-08-21 DIAGNOSIS — D631 Anemia in chronic kidney disease: Secondary | ICD-10-CM | POA: Diagnosis not present

## 2021-08-22 DIAGNOSIS — I5020 Unspecified systolic (congestive) heart failure: Secondary | ICD-10-CM | POA: Diagnosis not present

## 2021-08-22 DIAGNOSIS — I4891 Unspecified atrial fibrillation: Secondary | ICD-10-CM | POA: Diagnosis not present

## 2021-08-22 DIAGNOSIS — M818 Other osteoporosis without current pathological fracture: Secondary | ICD-10-CM | POA: Diagnosis not present

## 2021-08-22 DIAGNOSIS — D509 Iron deficiency anemia, unspecified: Secondary | ICD-10-CM | POA: Diagnosis not present

## 2021-08-22 DIAGNOSIS — D631 Anemia in chronic kidney disease: Secondary | ICD-10-CM | POA: Diagnosis not present

## 2021-08-22 DIAGNOSIS — N400 Enlarged prostate without lower urinary tract symptoms: Secondary | ICD-10-CM | POA: Diagnosis not present

## 2021-08-22 DIAGNOSIS — Z992 Dependence on renal dialysis: Secondary | ICD-10-CM | POA: Diagnosis not present

## 2021-08-22 DIAGNOSIS — N186 End stage renal disease: Secondary | ICD-10-CM | POA: Diagnosis not present

## 2021-08-22 DIAGNOSIS — N2581 Secondary hyperparathyroidism of renal origin: Secondary | ICD-10-CM | POA: Diagnosis not present

## 2021-08-23 DIAGNOSIS — N2581 Secondary hyperparathyroidism of renal origin: Secondary | ICD-10-CM | POA: Diagnosis not present

## 2021-08-23 DIAGNOSIS — N186 End stage renal disease: Secondary | ICD-10-CM | POA: Diagnosis not present

## 2021-08-23 DIAGNOSIS — D509 Iron deficiency anemia, unspecified: Secondary | ICD-10-CM | POA: Diagnosis not present

## 2021-08-23 DIAGNOSIS — Z992 Dependence on renal dialysis: Secondary | ICD-10-CM | POA: Diagnosis not present

## 2021-08-23 DIAGNOSIS — D631 Anemia in chronic kidney disease: Secondary | ICD-10-CM | POA: Diagnosis not present

## 2021-08-24 ENCOUNTER — Telehealth: Payer: Self-pay | Admitting: Cardiology

## 2021-08-24 ENCOUNTER — Telehealth: Payer: Self-pay | Admitting: *Deleted

## 2021-08-24 NOTE — Telephone Encounter (Signed)
Spoke to pt who denies any symptoms other than an increased heart rate. Pt stated that he usually checks his hr when he checks his bp, but does not currently keep a log of hr. Sent message to schedulers to see if we can find a sooner appt for pt as he is currently scheduled in May.   Please advise.

## 2021-08-24 NOTE — Telephone Encounter (Signed)
° °  STAT if HR is under 50 or over 120 (normal HR is 60-100 beats per minute)  What is your heart rate? 140-150  Do you have a log of your heart rate readings (document readings)?   Do you have any other symptoms? Thayer Headings with Dr. Toya Smothers office [facility] [place] [facility] calling, they said pt needs to f/u with Dr. Domenic Polite due as soon as possible due to high HR

## 2021-08-24 NOTE — Telephone Encounter (Signed)
Appointment scheduled with Malka So, PA for Wednesday, 09/05/2021 at 1:30 in [facility] clinic - Heart & Vascular Bld - entrance C.  Patient notified & verbalized understanding.  Patient has to call transportation so appointments has to be 7 or more days out.  States his medicine is working good now & is feeling okay.  Informed him if symptoms worsen, he should return to ED for evaluation as he may need cardioversion.

## 2021-08-24 NOTE — Telephone Encounter (Signed)
Pt has appointment on 09/20/21 in the [place] office.

## 2021-08-25 DIAGNOSIS — D509 Iron deficiency anemia, unspecified: Secondary | ICD-10-CM | POA: Diagnosis not present

## 2021-08-25 DIAGNOSIS — N186 End stage renal disease: Secondary | ICD-10-CM | POA: Diagnosis not present

## 2021-08-25 DIAGNOSIS — N2581 Secondary hyperparathyroidism of renal origin: Secondary | ICD-10-CM | POA: Diagnosis not present

## 2021-08-25 DIAGNOSIS — D631 Anemia in chronic kidney disease: Secondary | ICD-10-CM | POA: Diagnosis not present

## 2021-08-25 DIAGNOSIS — Z992 Dependence on renal dialysis: Secondary | ICD-10-CM | POA: Diagnosis not present

## 2021-08-27 ENCOUNTER — Ambulatory Visit (HOSPITAL_COMMUNITY): Payer: Medicare Other | Admitting: Physician Assistant

## 2021-08-28 DIAGNOSIS — N186 End stage renal disease: Secondary | ICD-10-CM | POA: Diagnosis not present

## 2021-08-28 DIAGNOSIS — N2581 Secondary hyperparathyroidism of renal origin: Secondary | ICD-10-CM | POA: Diagnosis not present

## 2021-08-28 DIAGNOSIS — D509 Iron deficiency anemia, unspecified: Secondary | ICD-10-CM | POA: Diagnosis not present

## 2021-08-28 DIAGNOSIS — D631 Anemia in chronic kidney disease: Secondary | ICD-10-CM | POA: Diagnosis not present

## 2021-08-28 DIAGNOSIS — Z992 Dependence on renal dialysis: Secondary | ICD-10-CM | POA: Diagnosis not present

## 2021-08-30 DIAGNOSIS — D631 Anemia in chronic kidney disease: Secondary | ICD-10-CM | POA: Diagnosis not present

## 2021-08-30 DIAGNOSIS — N186 End stage renal disease: Secondary | ICD-10-CM | POA: Diagnosis not present

## 2021-08-30 DIAGNOSIS — D509 Iron deficiency anemia, unspecified: Secondary | ICD-10-CM | POA: Diagnosis not present

## 2021-08-30 DIAGNOSIS — N2581 Secondary hyperparathyroidism of renal origin: Secondary | ICD-10-CM | POA: Diagnosis not present

## 2021-08-30 DIAGNOSIS — Z992 Dependence on renal dialysis: Secondary | ICD-10-CM | POA: Diagnosis not present

## 2021-08-31 ENCOUNTER — Ambulatory Visit (HOSPITAL_COMMUNITY)
Admission: RE | Admit: 2021-08-31 | Discharge: 2021-08-31 | Disposition: A | Discharge status: Home / Self Care | Payer: Medicare Other | Source: Ambulatory Visit | Attending: Vascular Surgery | Admitting: Vascular Surgery

## 2021-08-31 ENCOUNTER — Ambulatory Visit (INDEPENDENT_AMBULATORY_CARE_PROVIDER_SITE_OTHER)
Admission: RE | Admit: 2021-08-31 | Discharge: 2021-08-31 | Disposition: A | Discharge status: Home / Self Care | Payer: Medicare Other | Source: Ambulatory Visit | Attending: Vascular Surgery | Admitting: Vascular Surgery

## 2021-08-31 ENCOUNTER — Other Ambulatory Visit: Payer: Self-pay

## 2021-08-31 ENCOUNTER — Encounter: Payer: Self-pay | Admitting: Vascular Surgery

## 2021-08-31 ENCOUNTER — Ambulatory Visit (INDEPENDENT_AMBULATORY_CARE_PROVIDER_SITE_OTHER): Payer: Medicare Other | Admitting: Vascular Surgery

## 2021-08-31 VITALS — BP 112/71 | HR 99 | Temp 97.5°F | Resp 16 | Ht 72.0 in | Wt 162.0 lb

## 2021-08-31 DIAGNOSIS — Z992 Dependence on renal dialysis: Secondary | ICD-10-CM | POA: Diagnosis not present

## 2021-08-31 DIAGNOSIS — N186 End stage renal disease: Secondary | ICD-10-CM

## 2021-08-31 NOTE — Progress Notes (Signed)
Office Note     CC:  ESRD Requesting Provider:  Ramiro Harvest, PA-C  HPI: Rodney Gordon is a Left handed 66 y.o. (1956/04/27) male with kidney disease who presents at the request of Ramiro Harvest, PA-C for permanent HD access. The patient has had prior access procedures -right-sided brachiocephalic fistula that lasted 3 years.  The patient then went on peritoneal dialysis but stopped after having peritonitis. Per pt, previous tunneled lines have been placed in bilateral internal jugular veins. Current access is right sided internal jugular catheter. Dialysis days are Tuesday Thursday Saturday.   On exam, [place] was doing well.  Native of [place], he has lived there his entire life.  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  He had no questions regarding dialysis, and understood the importance of fistula creation.  The pt is not on a statin for cholesterol management.  The pt is not on a daily aspirin.   Other AC:  eliquis - afib The pt is on medications for hypertension.   The pt is not diabetic. Tobacco hx:  -  Past Medical History:  Diagnosis Date   Anemia of chronic disease    Aortic insufficiency    Mild to moderate   Coronary atherosclerosis of native coronary artery    Nonobstructive at catheterization 2004   ESRD on hemodialysis [facility])    [name]   Essential hypertension    Peripheral vascular disease [facility])     Past Surgical History:  Procedure Laterality Date   A/V FISTULAGRAM Right 09/05/2017   Procedure: A/V FISTULAGRAM;  Surgeon: Angelia Mould, MD;  Location: [place] [facility];  Service: Cardiovascular;  Laterality: Right;   AV FISTULA PLACEMENT Right 12/17/2016   Procedure: ARTERIOVENOUS (AV) FISTULA CREATION- RIGHT BRACHIOCEPHALIC;  Surgeon: Elam Dutch, MD;  Location: [place] OR;  Service: Vascular;  Laterality: Right;   CAPD INSERTION N/A 11/03/2017   Procedure: LAPAROSCOPIC INSERTION  CONTINUOUS AMBULATORY PERITONEAL DIALYSIS  (CAPD) CATHETER;  Surgeon: Clovis Riley, MD;  Location: [place];  Service: General;  Laterality: N/A;   CARDIAC [facility]TERIZATION     [facility]   IR DIALY SHUNT INTRO [place] W/IMG RIGHT Right 04/24/2017   IR US GUIDE VASC ACCESS RIGHT  04/24/2017   PERIPHERAL VASCULAR BALLOON ANGIOPLASTY Right 09/05/2017   Procedure: PERIPHERAL VASCULAR BALLOON ANGIOPLASTY;  Surgeon: Angelia Mould, MD;  Location: [place] [facility];  Service: Cardiovascular;  Laterality: Right;   PROSTATE SURGERY     TIBIA FRACTURE SURGERY     Left    Social History   Socioeconomic History   Marital status: Single    Spouse name: Not on file   Number of children: Not on file   Years of education: Not on file   Highest education level: Not on file  Occupational History   Not on file  Tobacco Use   Smoking status: Former    Packs/day: 1.00    Years: 25.00    Pack years: 25.00    Types: Cigarettes    Start date: 07/07/1981    Quit date: 07/22/1998    Years since quitting: 23.1   Smokeless tobacco: Never  Vaping Use   Vaping Use: Never used  Substance and Sexual Activity   Alcohol use: No    Alcohol/week: 0.0 standard drinks   Drug use: No   Sexual activity: Not on file  Other Topics Concern   Not on file  Social History  Narrative   Not on file   Social Determinants of Health   Financial Resource Strain: Not on file  Food Insecurity: Not on file  Transportation Needs: Not on file  Physical Activity: Not on file  Stress: Not on file  Social Connections: Not on file  Intimate Partner Violence: Not on file    Family History  Problem Relation Age of Onset   Hypertension Mother    Heart disease Mother    AAA (abdominal aortic aneurysm) Mother    Hypertension Father    Cerebral aneurysm Father    Cancer Other    Coronary artery disease Other     Current Outpatient Medications  Medication Sig Dispense Refill   acetaminophen  (TYLENOL) 650 MG CR tablet Take 650 mg by mouth daily as needed for pain.     amiodarone (PACERONE) 200 MG tablet Take 1 tablet (200 mg total) by mouth 2 (two) times daily. 60 tablet 1   apixaban (ELIQUIS) 5 MG TABS tablet Take 1 tablet (5 mg total) by mouth 2 (two) times daily. (Patient taking differently: Take 2.5 mg by mouth 2 (two) times daily.) 60 tablet 0   carvedilol (COREG) 3.125 MG tablet Take 1 tablet (3.125 mg total) by mouth 2 (two) times daily with a meal. 60 tablet 1   lidocaine-prilocaine (EMLA) cream Apply 1 application topically as needed (prior to dialysis).      nitroGLYCERIN (NITROSTAT) 0.4 MG SL tablet Place 1 tablet (0.4 mg total) under the tongue as directed. (Patient taking differently: Place 0.4 mg under the tongue every 5 (five) minutes as needed for chest pain.) 25 tablet 2   pantoprazole (PROTONIX) 40 MG tablet Take 1 tablet (40 mg total) by mouth daily. 30 tablet 1   sodium bicarbonate 650 MG tablet Take 1,300 mg by mouth 3 (three) times daily.      tamsulosin (FLOMAX) 0.4 MG CAPS capsule Take 1 capsule by mouth daily as needed.     No current facility-administered medications for this visit.    No Known Allergies   REVIEW OF SYSTEMS:   [X]  denotes positive finding, [ ]  denotes negative finding Cardiac  Comments:  Chest pain or chest pressure:    Shortness of breath upon exertion:    Short of breath when lying flat:    Irregular heart rhythm:        Vascular    Pain in calf, thigh, or hip brought on by ambulation:    Pain in feet at night that wakes you up from your sleep:     Blood clot in your veins:    Leg swelling:         Pulmonary    Oxygen at home:    Productive cough:     Wheezing:         Neurologic    Sudden weakness in arms or legs:     Sudden numbness in arms or legs:     Sudden onset of difficulty speaking or slurred speech:    Temporary loss of vision in one eye:     Problems with dizziness:         Gastrointestinal    Blood in  stool:     Vomited blood:         Genitourinary    Burning when urinating:     Blood in urine:        Psychiatric    Major depression:         Hematologic  Bleeding problems:    Problems with blood clotting too easily:        Skin    Rashes or ulcers:        Constitutional    Fever or chills:      PHYSICAL EXAMINATION:  Vitals:   08/31/21 1355  BP: 112/71  Pulse: 99  Resp: 16  Temp: (!) 97.5 F (36.4 C)  TempSrc: Temporal  SpO2: 98%  Weight: 162 lb (73.5 kg)  Height: 6' (1.829 m)    General:  WDWN in NAD; vital signs documented above Gait: Not observed HENT: WNL, normocephalic Pulmonary: normal non-labored breathing Cardiac: regular HR,  Abdomen: soft, NT, no masses Skin: without rashes Vascular Exam/Pulses:  Right Left  Radial 1+ (normal) 2+ (normal)  Ulnar 2+ (normal) 2+ (normal)  Femoral                 Extremities: without ischemic changes, without Gangrene , without cellulitis; without open wounds;  Musculoskeletal: no muscle wasting or atrophy  Neurologic: A&O X 3;  No focal weakness or paresthesias are detected Psychiatric:  The pt has Normal affect.   Non-Invasive Vascular Imaging:   Intraluminal diameter of sufficient size for bilateral brachial artery based AV fistula Bilateral superficial veins demonstrate adequately sized basilic veins bilaterally for fistula creation    ASSESSMENT/PLAN:  DASH CARDARELLI is a 66 y.o. male who presents with end stage renal disease  Based on vein mapping and examination, patient is a candidate for left or right brachiobasilic fistula creation.  Being that he is left-handed, we discussed right-sided brachiobasilic fistula creation in a staged fashion. I had an extensive discussion with this patient in regards to the nature of access surgery, including risk, benefits, and alternatives.   The patient is aware that the risks of access surgery include but are not limited to: bleeding, infection, steal  syndrome, nerve damage, ischemic monomelic neuropathy, failure of access to mature, complications related to venous hypertension, and possible need for additional access procedures in the future.  I discussed with the patient the nature of the staged access procedure, specifically the need for a second operation to transpose the first stage fistula if it matures adequately.   After discussing the risk and benefits, Chanan elected to proceed with 1st stage brachiobasilic fistula creation.  This will be scheduled in the coming weeks.  Broadus John, MD [facility] [phone]

## 2021-09-01 DIAGNOSIS — N186 End stage renal disease: Secondary | ICD-10-CM | POA: Diagnosis not present

## 2021-09-01 DIAGNOSIS — Z992 Dependence on renal dialysis: Secondary | ICD-10-CM | POA: Diagnosis not present

## 2021-09-01 DIAGNOSIS — D631 Anemia in chronic kidney disease: Secondary | ICD-10-CM | POA: Diagnosis not present

## 2021-09-01 DIAGNOSIS — N2581 Secondary hyperparathyroidism of renal origin: Secondary | ICD-10-CM | POA: Diagnosis not present

## 2021-09-01 DIAGNOSIS — D509 Iron deficiency anemia, unspecified: Secondary | ICD-10-CM | POA: Diagnosis not present

## 2021-09-04 DIAGNOSIS — D509 Iron deficiency anemia, unspecified: Secondary | ICD-10-CM | POA: Diagnosis not present

## 2021-09-04 DIAGNOSIS — N186 End stage renal disease: Secondary | ICD-10-CM | POA: Diagnosis not present

## 2021-09-04 DIAGNOSIS — D631 Anemia in chronic kidney disease: Secondary | ICD-10-CM | POA: Diagnosis not present

## 2021-09-04 DIAGNOSIS — N2581 Secondary hyperparathyroidism of renal origin: Secondary | ICD-10-CM | POA: Diagnosis not present

## 2021-09-04 DIAGNOSIS — Z992 Dependence on renal dialysis: Secondary | ICD-10-CM | POA: Diagnosis not present

## 2021-09-05 ENCOUNTER — Encounter (HOSPITAL_COMMUNITY): Payer: Self-pay | Admitting: Physician Assistant

## 2021-09-05 ENCOUNTER — Ambulatory Visit (HOSPITAL_COMMUNITY)
Admission: RE | Admit: 2021-09-05 | Discharge: 2021-09-05 | Disposition: A | Discharge status: Home / Self Care | Payer: Medicare Other | Source: Ambulatory Visit | Attending: Physician Assistant | Admitting: Physician Assistant

## 2021-09-05 ENCOUNTER — Other Ambulatory Visit: Payer: Self-pay

## 2021-09-05 VITALS — BP 94/70 | HR 104 | Ht 72.0 in | Wt 138.0 lb

## 2021-09-05 DIAGNOSIS — I4819 Other persistent atrial fibrillation: Secondary | ICD-10-CM | POA: Diagnosis not present

## 2021-09-05 DIAGNOSIS — I739 Peripheral vascular disease, unspecified: Secondary | ICD-10-CM | POA: Insufficient documentation

## 2021-09-05 DIAGNOSIS — I080 Rheumatic disorders of both mitral and aortic valves: Secondary | ICD-10-CM | POA: Diagnosis not present

## 2021-09-05 DIAGNOSIS — I5022 Chronic systolic (congestive) heart failure: Secondary | ICD-10-CM | POA: Insufficient documentation

## 2021-09-05 DIAGNOSIS — I2510 Atherosclerotic heart disease of native coronary artery without angina pectoris: Secondary | ICD-10-CM | POA: Insufficient documentation

## 2021-09-05 DIAGNOSIS — I130 Hypertensive heart and chronic kidney disease with heart failure and stage 1 through stage 4 chronic kidney disease, or unspecified chronic kidney disease: Secondary | ICD-10-CM | POA: Diagnosis not present

## 2021-09-05 DIAGNOSIS — I4891 Unspecified atrial fibrillation: Secondary | ICD-10-CM

## 2021-09-05 DIAGNOSIS — Z79899 Other long term (current) drug therapy: Secondary | ICD-10-CM | POA: Diagnosis not present

## 2021-09-05 DIAGNOSIS — D6869 Other thrombophilia: Secondary | ICD-10-CM | POA: Diagnosis not present

## 2021-09-05 DIAGNOSIS — Z7901 Long term (current) use of anticoagulants: Secondary | ICD-10-CM | POA: Diagnosis not present

## 2021-09-05 DIAGNOSIS — Z9115 Patient's noncompliance with renal dialysis: Secondary | ICD-10-CM | POA: Insufficient documentation

## 2021-09-05 DIAGNOSIS — N186 End stage renal disease: Secondary | ICD-10-CM | POA: Diagnosis not present

## 2021-09-05 DIAGNOSIS — Z992 Dependence on renal dialysis: Secondary | ICD-10-CM | POA: Insufficient documentation

## 2021-09-05 DIAGNOSIS — I4892 Unspecified atrial flutter: Secondary | ICD-10-CM | POA: Insufficient documentation

## 2021-09-05 LAB — CBC
HCT: 28.8 % — ABNORMAL LOW (ref 39.0–52.0)
Hemoglobin: 9.1 g/dL — ABNORMAL LOW (ref 13.0–17.0)
MCH: 31.5 pg (ref 26.0–34.0)
MCHC: 31.6 g/dL (ref 30.0–36.0)
MCV: 99.7 fL (ref 80.0–100.0)
Platelets: 332 10*3/uL (ref 150–400)
RBC: 2.89 MIL/uL — ABNORMAL LOW (ref 4.22–5.81)
RDW: 16.5 % — ABNORMAL HIGH (ref 11.5–15.5)
WBC: 7.4 10*3/uL (ref 4.0–10.5)
nRBC: 0 % (ref 0.0–0.2)

## 2021-09-05 MED ORDER — APIXABAN 5 MG PO TABS: 5.0000 mg | ORAL_TABLET | Freq: Two times a day (BID) | ORAL | 3 refills | Status: DC

## 2021-09-05 NOTE — Progress Notes (Signed)
Primary Care Physician: Neale Burly, MD Primary Cardiologist: Dr Domenic Polite Primary Electrophysiologist: none Referring Physician: Dr Farrel Gobble is a 66 y.o. male with a history of AI, MR, CAD, ESRD, HTN, PVD, HFrEF, atrial fibrillation who presents for consultation in the [facility].  The patient was initially diagnosed with atrial fibrillation 02/13/21 after presenting to the cardiology office for routine follow up. He was started on Eliquis for a CHADS2VASC score of 4 and diltiazem for rate control. At that time, patient preferred a rate control strategy. He was hospitalized 08/09/21 for general malaise, palpitations, and SOB. He had not been compliant with HD. His Hgb at that time was 6.9 and he was transfused 2 units of PRBC. His heart rates were elevated at that time and he was started on carvedilol and amiodarone. Echo showed EF 30-35%.   Today, patient does continue to have palpitations and fatigue. He did stop his Eliquis for an unclear reason. He has also been on metoprolol instead of carvedilol. He also had a visit with Dr Virl Cagey for AV fistula creation.   Today, he denies symptoms of chest pain, shortness of breath, orthopnea, PND, lower extremity edema, dizziness, presyncope, syncope, snoring, daytime somnolence, bleeding, or neurologic sequela. The patient is tolerating medications without difficulties and is otherwise without complaint today.    Atrial Fibrillation Risk Factors:  he does not have symptoms or diagnosis of sleep apnea. he does not have a history of rheumatic fever. he does not have a history of alcohol use.  he has a BMI of Body mass index is 18.72 kg/m.Marland Kitchen Filed Weights   09/05/21 1415  Weight: 62.6 kg    Family History  Problem Relation Age of Onset   Hypertension Mother    Heart disease Mother    AAA (abdominal aortic aneurysm) Mother    Hypertension Father    Cerebral aneurysm Father    Cancer Other     Coronary artery disease Other      Atrial Fibrillation Management history:  Previous antiarrhythmic drugs: amiodarone  Previous cardioversions: none Previous ablations: none CHADS2VASC score: 4 Anticoagulation history: Eliquis   Past Medical History:  Diagnosis Date   Anemia of chronic disease    Aortic insufficiency    Mild to moderate   Coronary atherosclerosis of native coronary artery    Nonobstructive at catheterization 2004   ESRD on hemodialysis [facility])    [name]   Essential hypertension    Peripheral vascular disease [facility])    Past Surgical History:  Procedure Laterality Date   A/V FISTULAGRAM Right 09/05/2017   Procedure: A/V FISTULAGRAM;  Surgeon: Angelia Mould, MD;  Location: [place] [facility];  Service: Cardiovascular;  Laterality: Right;   AV FISTULA PLACEMENT Right 12/17/2016   Procedure: ARTERIOVENOUS (AV) FISTULA CREATION- RIGHT BRACHIOCEPHALIC;  Surgeon: Elam Dutch, MD;  Location: [place] OR;  Service: Vascular;  Laterality: Right;   CAPD INSERTION N/A 11/03/2017   Procedure: LAPAROSCOPIC INSERTION CONTINUOUS AMBULATORY PERITONEAL DIALYSIS  (CAPD) CATHETER;  Surgeon: Clovis Riley, MD;  Location: [place];  Service: General;  Laterality: N/A;   CARDIAC [facility]TERIZATION     [facility]   IR DIALY SHUNT INTRO [place] W/IMG RIGHT Right 04/24/2017   IR US GUIDE VASC ACCESS RIGHT  04/24/2017   PERIPHERAL VASCULAR BALLOON ANGIOPLASTY Right 09/05/2017   Procedure: PERIPHERAL VASCULAR BALLOON ANGIOPLASTY;  Surgeon: Angelia Mould, MD;  Location: [place] [facility];  Service: Cardiovascular;  Laterality: Right;   PROSTATE SURGERY     TIBIA FRACTURE SURGERY     Left    Current Outpatient Medications  Medication Sig Dispense Refill   acetaminophen (TYLENOL) 650 MG CR tablet Take 650 mg by mouth daily as needed for pain.     amiodarone (PACERONE) 200 MG tablet Take 1 tablet (200 mg total) by mouth 2 (two) times daily. 60 tablet 1    calcitRIOL (ROCALTROL) 0.5 MCG capsule Take 0.5 mcg by mouth 3 (three) times daily.     cinacalcet (SENSIPAR) 30 MG tablet Take 30 mg by mouth daily.     lidocaine-prilocaine (EMLA) cream Apply 1 application topically as needed (prior to dialysis).      methocarbamol (ROBAXIN) 500 MG tablet Take 500 mg by mouth at bedtime.     metoprolol tartrate (LOPRESSOR) 100 MG tablet Take 100 mg by mouth 2 (two) times daily.     nitroGLYCERIN (NITROSTAT) 0.4 MG SL tablet Place 1 tablet (0.4 mg total) under the tongue as directed. 25 tablet 2   pantoprazole (PROTONIX) 40 MG tablet Take 1 tablet (40 mg total) by mouth daily. 30 tablet 1   potassium chloride SA (KLOR-CON M) 20 MEQ tablet Take 20 mEq by mouth 3 (three) times daily.     sodium bicarbonate 650 MG tablet Take 1,300 mg by mouth 3 (three) times daily.      tamsulosin (FLOMAX) 0.4 MG CAPS capsule Take 1 capsule by mouth daily as needed.     apixaban (ELIQUIS) 5 MG TABS tablet Take 1 tablet (5 mg total) by mouth 2 (two) times daily. 60 tablet 3   No current facility-administered medications for this encounter.    No Known Allergies  Social History   Socioeconomic History   Marital status: Single    Spouse name: Not on file   Number of children: Not on file   Years of education: Not on file   Highest education level: Not on file  Occupational History   Not on file  Tobacco Use   Smoking status: Former    Packs/day: 1.00    Years: 25.00    Pack years: 25.00    Types: Cigarettes    Start date: 07/07/1981    Quit date: 07/22/1998    Years since quitting: 23.1   Smokeless tobacco: Never   Tobacco comments:    Former smoker 09/05/2021  Vaping Use   Vaping Use: Never used  Substance and Sexual Activity   Alcohol use: No    Alcohol/week: 0.0 standard drinks   Drug use: No   Sexual activity: Not on file  Other Topics Concern   Not on file  Social History Narrative   Not on file   Social Determinants of Health   Financial Resource  Strain: Not on file  Food Insecurity: Not on file  Transportation Needs: Not on file  Physical Activity: Not on file  Stress: Not on file  Social Connections: Not on file  Intimate Partner Violence: Not on file     ROS- All systems are reviewed and negative except as per the HPI above.  Physical Exam: Vitals:   09/05/21 1415  BP: 94/70  Pulse: (!) 104  Weight: 62.6 kg  Height: 6' (1.829 m)    GEN- The patient is a well appearing male, alert and oriented x 3 today.   Head- normocephalic, atraumatic Eyes-  Sclera clear, conjunctiva pink Ears- hearing intact Oropharynx- clear Neck- supple  Lungs- Clear to ausculation bilaterally, normal work  of breathing Heart- irregular rate and rhythm, no murmurs, rubs or gallops  GI- soft, NT, ND, + BS Extremities- no clubbing, cyanosis, or edema MS- no significant deformity or atrophy Skin- no rash or lesion Psych- euthymic mood, full affect Neuro- strength and sensation are intact  Wt Readings from Last 3 Encounters:  09/05/21 62.6 kg  08/31/21 73.5 kg  08/12/21 62.3 kg    EKG today demonstrates  Coarse afib vs atypical atrial flutter with variable block Vent. rate 104 BPM PR interval * ms QRS duration 82 ms QT/QTcB 384/504 ms  Echo 08/09/21 demonstrated   1. Left ventricular ejection fraction, by estimation, is 30 to 35%. The  left ventricle has moderately decreased function. The left ventricle  demonstrates global hypokinesis. There is mild left ventricular  hypertrophy. Left ventricular diastolic parameters are indeterminate.   2. Right ventricular systolic function is normal. The right ventricular  size is normal. There is normal pulmonary artery systolic pressure.   3. Left atrial size was severely dilated.   4. The pericardial effusion is circumferential.   5. The MR vena contracta is 0.37 cm. . The mitral valve is abnormal. Mild to moderate mitral valve regurgitation. No evidence of mitral stenosis.   6. The  tricuspid valve is abnormal. Tricuspid valve regurgitation is  moderate.   7. The aortic valve is tricuspid. There is mild calcification of the  aortic valve. There is mild thickening of the aortic valve. Aortic valve  regurgitation is moderate. No aortic stenosis is present.   8. The inferior vena cava is normal in size with greater than 50%  respiratory variability, suggesting right atrial pressure of 3 mmHg.   Epic records are reviewed at length today  CHA2DS2-VASc Score = 4  The patient's score is based upon: CHF History: 1 HTN History: 1 Diabetes History: 0 Stroke History: 0 Vascular Disease History: 1 Age Score: 1 Gender Score: 0       ASSESSMENT AND PLAN: 1. Persistent Atrial Fibrillation/atrial flutter The patient's CHA2DS2-VASc score is 4, indicating a 4.8% annual risk of stroke.   Continue amiodarone 200 mg BID Resume Eliquis 5 mg BID (age < 45, weight < 60 kg) Check CBC D/w Dr Domenic Polite and Dr Virl Cagey. Will proceed with DCCV first as fistula creation is elective and can be delayed.  Continue Toprol 100 mg BID, discontinue coreg (has not been taking)  2. Secondary Hypercoagulable State (ICD10:  D68.69) The patient is at significant risk for stroke/thromboembolism based upon his CHA2DS2-VASc Score of 4.  Continue Apixaban (Eliquis).   3. ESRD On HD Tu/Th/Sat  4. CAD No anginal symptoms.  5. Chronic HFrEF EF 30-35% Appears euvolemic today Hopefully EF will improve with SR  6. Valvular heart disease Mild-mod MR Moderate AI  7. HTN Stable, no changes today.   Follow up in AF clinic post DCCV.   [facility] [address] [place], [place] [address] [phone] 09/06/2021 8:23 AM

## 2021-09-05 NOTE — Patient Instructions (Signed)
STOP COREG (carvedilol)  START Eliquis 5mg  twice a day

## 2021-09-06 ENCOUNTER — Other Ambulatory Visit (HOSPITAL_COMMUNITY): Payer: Self-pay | Admitting: *Deleted

## 2021-09-06 ENCOUNTER — Encounter (HOSPITAL_COMMUNITY): Payer: Self-pay | Admitting: *Deleted

## 2021-09-06 DIAGNOSIS — D631 Anemia in chronic kidney disease: Secondary | ICD-10-CM | POA: Diagnosis not present

## 2021-09-06 DIAGNOSIS — Z992 Dependence on renal dialysis: Secondary | ICD-10-CM | POA: Diagnosis not present

## 2021-09-06 DIAGNOSIS — D6869 Other thrombophilia: Secondary | ICD-10-CM | POA: Insufficient documentation

## 2021-09-06 DIAGNOSIS — I4819 Other persistent atrial fibrillation: Secondary | ICD-10-CM

## 2021-09-06 DIAGNOSIS — D509 Iron deficiency anemia, unspecified: Secondary | ICD-10-CM | POA: Diagnosis not present

## 2021-09-06 DIAGNOSIS — N2581 Secondary hyperparathyroidism of renal origin: Secondary | ICD-10-CM | POA: Diagnosis not present

## 2021-09-06 DIAGNOSIS — N186 End stage renal disease: Secondary | ICD-10-CM | POA: Diagnosis not present

## 2021-09-08 DIAGNOSIS — N186 End stage renal disease: Secondary | ICD-10-CM | POA: Diagnosis not present

## 2021-09-08 DIAGNOSIS — N2581 Secondary hyperparathyroidism of renal origin: Secondary | ICD-10-CM | POA: Diagnosis not present

## 2021-09-08 DIAGNOSIS — D631 Anemia in chronic kidney disease: Secondary | ICD-10-CM | POA: Diagnosis not present

## 2021-09-08 DIAGNOSIS — Z992 Dependence on renal dialysis: Secondary | ICD-10-CM | POA: Diagnosis not present

## 2021-09-08 DIAGNOSIS — D509 Iron deficiency anemia, unspecified: Secondary | ICD-10-CM | POA: Diagnosis not present

## 2021-09-11 DIAGNOSIS — N186 End stage renal disease: Secondary | ICD-10-CM | POA: Diagnosis not present

## 2021-09-11 DIAGNOSIS — Z992 Dependence on renal dialysis: Secondary | ICD-10-CM | POA: Diagnosis not present

## 2021-09-11 DIAGNOSIS — D631 Anemia in chronic kidney disease: Secondary | ICD-10-CM | POA: Diagnosis not present

## 2021-09-11 DIAGNOSIS — D509 Iron deficiency anemia, unspecified: Secondary | ICD-10-CM | POA: Diagnosis not present

## 2021-09-11 DIAGNOSIS — N2581 Secondary hyperparathyroidism of renal origin: Secondary | ICD-10-CM | POA: Diagnosis not present

## 2021-09-13 DIAGNOSIS — N186 End stage renal disease: Secondary | ICD-10-CM | POA: Diagnosis not present

## 2021-09-13 DIAGNOSIS — Z992 Dependence on renal dialysis: Secondary | ICD-10-CM | POA: Diagnosis not present

## 2021-09-13 DIAGNOSIS — N2581 Secondary hyperparathyroidism of renal origin: Secondary | ICD-10-CM | POA: Diagnosis not present

## 2021-09-13 DIAGNOSIS — D631 Anemia in chronic kidney disease: Secondary | ICD-10-CM | POA: Diagnosis not present

## 2021-09-13 DIAGNOSIS — D509 Iron deficiency anemia, unspecified: Secondary | ICD-10-CM | POA: Diagnosis not present

## 2021-09-14 DIAGNOSIS — N2581 Secondary hyperparathyroidism of renal origin: Secondary | ICD-10-CM | POA: Diagnosis not present

## 2021-09-14 DIAGNOSIS — D509 Iron deficiency anemia, unspecified: Secondary | ICD-10-CM | POA: Diagnosis not present

## 2021-09-14 DIAGNOSIS — D631 Anemia in chronic kidney disease: Secondary | ICD-10-CM | POA: Diagnosis not present

## 2021-09-14 DIAGNOSIS — Z992 Dependence on renal dialysis: Secondary | ICD-10-CM | POA: Diagnosis not present

## 2021-09-14 DIAGNOSIS — N186 End stage renal disease: Secondary | ICD-10-CM | POA: Diagnosis not present

## 2021-09-15 DIAGNOSIS — N186 End stage renal disease: Secondary | ICD-10-CM | POA: Diagnosis not present

## 2021-09-15 DIAGNOSIS — Z992 Dependence on renal dialysis: Secondary | ICD-10-CM | POA: Diagnosis not present

## 2021-09-15 DIAGNOSIS — N2581 Secondary hyperparathyroidism of renal origin: Secondary | ICD-10-CM | POA: Diagnosis not present

## 2021-09-15 DIAGNOSIS — D509 Iron deficiency anemia, unspecified: Secondary | ICD-10-CM | POA: Diagnosis not present

## 2021-09-15 DIAGNOSIS — D631 Anemia in chronic kidney disease: Secondary | ICD-10-CM | POA: Diagnosis not present

## 2021-09-17 DIAGNOSIS — Z20822 Contact with and (suspected) exposure to covid-19: Secondary | ICD-10-CM | POA: Diagnosis not present

## 2021-09-18 ENCOUNTER — Other Ambulatory Visit: Payer: Self-pay

## 2021-09-18 DIAGNOSIS — D631 Anemia in chronic kidney disease: Secondary | ICD-10-CM | POA: Diagnosis not present

## 2021-09-18 DIAGNOSIS — D509 Iron deficiency anemia, unspecified: Secondary | ICD-10-CM | POA: Diagnosis not present

## 2021-09-18 DIAGNOSIS — Z992 Dependence on renal dialysis: Secondary | ICD-10-CM | POA: Diagnosis not present

## 2021-09-18 DIAGNOSIS — N186 End stage renal disease: Secondary | ICD-10-CM | POA: Diagnosis not present

## 2021-09-18 DIAGNOSIS — N2581 Secondary hyperparathyroidism of renal origin: Secondary | ICD-10-CM | POA: Diagnosis not present

## 2021-09-19 ENCOUNTER — Encounter (HOSPITAL_COMMUNITY): Payer: Self-pay | Admitting: Internal Medicine

## 2021-09-19 DIAGNOSIS — N2581 Secondary hyperparathyroidism of renal origin: Secondary | ICD-10-CM | POA: Diagnosis not present

## 2021-09-19 DIAGNOSIS — Z992 Dependence on renal dialysis: Secondary | ICD-10-CM | POA: Diagnosis not present

## 2021-09-19 DIAGNOSIS — D509 Iron deficiency anemia, unspecified: Secondary | ICD-10-CM | POA: Diagnosis not present

## 2021-09-19 DIAGNOSIS — N186 End stage renal disease: Secondary | ICD-10-CM | POA: Diagnosis not present

## 2021-09-19 DIAGNOSIS — D631 Anemia in chronic kidney disease: Secondary | ICD-10-CM | POA: Diagnosis not present

## 2021-09-19 NOTE — Progress Notes (Signed)
Attempted to obtain medical history via telephone, unable to reach at this time. Unable to leave voicemail to return pre surgical testing department's phone call,due to mailbox not set up.   

## 2021-09-20 DIAGNOSIS — Z992 Dependence on renal dialysis: Secondary | ICD-10-CM | POA: Diagnosis not present

## 2021-09-20 DIAGNOSIS — D509 Iron deficiency anemia, unspecified: Secondary | ICD-10-CM | POA: Diagnosis not present

## 2021-09-20 DIAGNOSIS — N186 End stage renal disease: Secondary | ICD-10-CM | POA: Diagnosis not present

## 2021-09-20 DIAGNOSIS — N2581 Secondary hyperparathyroidism of renal origin: Secondary | ICD-10-CM | POA: Diagnosis not present

## 2021-09-20 DIAGNOSIS — D631 Anemia in chronic kidney disease: Secondary | ICD-10-CM | POA: Diagnosis not present

## 2021-09-21 DIAGNOSIS — Z20822 Contact with and (suspected) exposure to covid-19: Secondary | ICD-10-CM | POA: Diagnosis not present

## 2021-09-22 DIAGNOSIS — N2581 Secondary hyperparathyroidism of renal origin: Secondary | ICD-10-CM | POA: Diagnosis not present

## 2021-09-22 DIAGNOSIS — D509 Iron deficiency anemia, unspecified: Secondary | ICD-10-CM | POA: Diagnosis not present

## 2021-09-22 DIAGNOSIS — N186 End stage renal disease: Secondary | ICD-10-CM | POA: Diagnosis not present

## 2021-09-22 DIAGNOSIS — Z992 Dependence on renal dialysis: Secondary | ICD-10-CM | POA: Diagnosis not present

## 2021-09-22 DIAGNOSIS — D631 Anemia in chronic kidney disease: Secondary | ICD-10-CM | POA: Diagnosis not present

## 2021-09-25 DIAGNOSIS — N2581 Secondary hyperparathyroidism of renal origin: Secondary | ICD-10-CM | POA: Diagnosis not present

## 2021-09-25 DIAGNOSIS — D631 Anemia in chronic kidney disease: Secondary | ICD-10-CM | POA: Diagnosis not present

## 2021-09-25 DIAGNOSIS — Z992 Dependence on renal dialysis: Secondary | ICD-10-CM | POA: Diagnosis not present

## 2021-09-25 DIAGNOSIS — D509 Iron deficiency anemia, unspecified: Secondary | ICD-10-CM | POA: Diagnosis not present

## 2021-09-25 DIAGNOSIS — N186 End stage renal disease: Secondary | ICD-10-CM | POA: Diagnosis not present

## 2021-09-26 ENCOUNTER — Ambulatory Visit: Payer: Medicare Other | Admitting: Student

## 2021-09-26 DIAGNOSIS — N2581 Secondary hyperparathyroidism of renal origin: Secondary | ICD-10-CM | POA: Diagnosis not present

## 2021-09-26 DIAGNOSIS — Z992 Dependence on renal dialysis: Secondary | ICD-10-CM | POA: Diagnosis not present

## 2021-09-26 DIAGNOSIS — D509 Iron deficiency anemia, unspecified: Secondary | ICD-10-CM | POA: Diagnosis not present

## 2021-09-26 DIAGNOSIS — D631 Anemia in chronic kidney disease: Secondary | ICD-10-CM | POA: Diagnosis not present

## 2021-09-26 DIAGNOSIS — N186 End stage renal disease: Secondary | ICD-10-CM | POA: Diagnosis not present

## 2021-09-27 ENCOUNTER — Encounter (HOSPITAL_COMMUNITY)
Admission: RE | Disposition: A | Discharge status: Home / Self Care | Payer: Self-pay | Source: Home / Self Care | Attending: Cardiovascular Disease

## 2021-09-27 ENCOUNTER — Encounter (HOSPITAL_COMMUNITY): Payer: Self-pay | Admitting: Physician Assistant

## 2021-09-27 ENCOUNTER — Ambulatory Visit (HOSPITAL_COMMUNITY)
Admission: RE | Admit: 2021-09-27 | Discharge: 2021-09-27 | Disposition: A | Discharge status: Home / Self Care | Payer: Medicare Other | Attending: Cardiovascular Disease | Admitting: Cardiovascular Disease

## 2021-09-27 ENCOUNTER — Encounter (HOSPITAL_COMMUNITY): Payer: Self-pay | Admitting: Anesthesiology

## 2021-09-27 ENCOUNTER — Ambulatory Visit (HOSPITAL_BASED_OUTPATIENT_CLINIC_OR_DEPARTMENT_OTHER)
Admission: RE | Admit: 2021-09-27 | Discharge: 2021-09-27 | Disposition: A | Discharge status: Home / Self Care | Payer: Medicare Other | Source: Home / Self Care | Attending: Physician Assistant | Admitting: Physician Assistant

## 2021-09-27 ENCOUNTER — Other Ambulatory Visit (HOSPITAL_COMMUNITY): Payer: Medicare Other | Admitting: Physician Assistant

## 2021-09-27 VITALS — BP 120/82 | HR 113 | Ht 72.0 in | Wt 138.0 lb

## 2021-09-27 DIAGNOSIS — Z992 Dependence on renal dialysis: Secondary | ICD-10-CM | POA: Insufficient documentation

## 2021-09-27 DIAGNOSIS — Z79899 Other long term (current) drug therapy: Secondary | ICD-10-CM | POA: Insufficient documentation

## 2021-09-27 DIAGNOSIS — I700 Atherosclerosis of aorta: Secondary | ICD-10-CM | POA: Diagnosis not present

## 2021-09-27 DIAGNOSIS — I4819 Other persistent atrial fibrillation: Secondary | ICD-10-CM | POA: Diagnosis not present

## 2021-09-27 DIAGNOSIS — D6869 Other thrombophilia: Secondary | ICD-10-CM | POA: Insufficient documentation

## 2021-09-27 DIAGNOSIS — I083 Combined rheumatic disorders of mitral, aortic and tricuspid valves: Secondary | ICD-10-CM | POA: Diagnosis not present

## 2021-09-27 DIAGNOSIS — Z9115 Patient's noncompliance with renal dialysis: Secondary | ICD-10-CM | POA: Diagnosis not present

## 2021-09-27 DIAGNOSIS — N186 End stage renal disease: Secondary | ICD-10-CM | POA: Diagnosis not present

## 2021-09-27 DIAGNOSIS — I4430 Unspecified atrioventricular block: Secondary | ICD-10-CM | POA: Diagnosis not present

## 2021-09-27 DIAGNOSIS — Z87891 Personal history of nicotine dependence: Secondary | ICD-10-CM | POA: Diagnosis not present

## 2021-09-27 DIAGNOSIS — I4892 Unspecified atrial flutter: Secondary | ICD-10-CM | POA: Diagnosis not present

## 2021-09-27 DIAGNOSIS — I5022 Chronic systolic (congestive) heart failure: Secondary | ICD-10-CM | POA: Diagnosis not present

## 2021-09-27 DIAGNOSIS — I2510 Atherosclerotic heart disease of native coronary artery without angina pectoris: Secondary | ICD-10-CM | POA: Diagnosis not present

## 2021-09-27 DIAGNOSIS — I132 Hypertensive heart and chronic kidney disease with heart failure and with stage 5 chronic kidney disease, or end stage renal disease: Secondary | ICD-10-CM | POA: Diagnosis not present

## 2021-09-27 SURGERY — CANCELLED PROCEDURE

## 2021-09-27 MED ORDER — APIXABAN 5 MG PO TABS: 5.0000 mg | ORAL_TABLET | Freq: Two times a day (BID) | ORAL | 3 refills | Status: AC

## 2021-09-27 MED ORDER — AMIODARONE HCL 200 MG PO TABS
200.0000 mg | ORAL_TABLET | Freq: Every day | ORAL | 1 refills | Status: AC
Start: 2021-09-27 — End: ?

## 2021-09-27 MED ORDER — METOPROLOL SUCCINATE ER 50 MG PO TB24: 50.0000 mg | ORAL_TABLET | Freq: Every day | ORAL | 3 refills | Status: DC

## 2021-09-27 NOTE — Addendum Note (Signed)
Encounter addended by: Juluis Mire, RN on: 09/27/2021 3:56 PM ? Actions taken: Order list changed, Diagnosis association updated

## 2021-09-27 NOTE — H&P (View-Only) (Signed)
Primary Care Physician: Neale Burly, MD Primary Cardiologist: Dr Domenic Polite Primary Electrophysiologist: none Referring Physician: Dr Farrel Gobble is a 66 y.o. male with a history of AI, MR, CAD, ESRD, HTN, PVD, HFrEF, atrial fibrillation who presents for consultation in the [facility].  The patient was initially diagnosed with atrial fibrillation 02/13/21 after presenting to the cardiology office for routine follow up. He was started on Eliquis for a CHADS2VASC score of 4 and diltiazem for rate control. At that time, patient preferred a rate control strategy. He was hospitalized 08/09/21 for general malaise, palpitations, and SOB. He had not been compliant with HD. His Hgb at that time was 6.9 and he was transfused 2 units of PRBC. His heart rates were elevated at that time and he was started on carvedilol and amiodarone. Echo showed EF 30-35%.   On follow up today, patient was scheduled for a DCCV today but this was cancelled as he had self stopped his Eliquis again. He mistakenly thought he only had to take anticoagulation for 3 weeks. He reports that he feels well today. He has some dyspnea with exertion but this is mild. He also stopped metoprolol for an unknown reason.   Today, he denies symptoms of palpitations, chest pain, orthopnea, PND, lower extremity edema, dizziness, presyncope, syncope, snoring, daytime somnolence, bleeding, or neurologic sequela. The patient is tolerating medications without difficulties and is otherwise without complaint today.    Atrial Fibrillation Risk Factors:  he does not have symptoms or diagnosis of sleep apnea. he does not have a history of rheumatic fever. he does not have a history of alcohol use.  he has a BMI of Body mass index is 18.72 kg/m.Marland Kitchen Filed Weights   09/27/21 1023  Weight: 62.6 kg     Family History  Problem Relation Age of Onset   Hypertension Mother    Heart disease Mother    AAA  (abdominal aortic aneurysm) Mother    Hypertension Father    Cerebral aneurysm Father    Cancer Other    Coronary artery disease Other      Atrial Fibrillation Management history:  Previous antiarrhythmic drugs: amiodarone  Previous cardioversions: none Previous ablations: none CHADS2VASC score: 4 Anticoagulation history: Eliquis   Past Medical History:  Diagnosis Date   Anemia of chronic disease    Aortic insufficiency    Mild to moderate   Coronary atherosclerosis of native coronary artery    Nonobstructive at catheterization 2004   ESRD on hemodialysis [facility])    [name]   Essential hypertension    Peripheral vascular disease [facility])    Past Surgical History:  Procedure Laterality Date   A/V FISTULAGRAM Right 09/05/2017   Procedure: A/V FISTULAGRAM;  Surgeon: Angelia Mould, MD;  Location: [place] [facility];  Service: Cardiovascular;  Laterality: Right;   AV FISTULA PLACEMENT Right 12/17/2016   Procedure: ARTERIOVENOUS (AV) FISTULA CREATION- RIGHT BRACHIOCEPHALIC;  Surgeon: Elam Dutch, MD;  Location: [place];  Service: Vascular;  Laterality: Right;   CAPD INSERTION N/A 11/03/2017   Procedure: LAPAROSCOPIC INSERTION CONTINUOUS AMBULATORY PERITONEAL DIALYSIS  (CAPD) CATHETER;  Surgeon: Clovis Riley, MD;  Location: [place];  Service: General;  Laterality: N/A;   CARDIAC CATHETERIZATION     at [facility]   IR DIALY SHUNT INTRO [place] W/IMG RIGHT Right 04/24/2017   IR US GUIDE VASC ACCESS RIGHT  04/24/2017   PERIPHERAL VASCULAR BALLOON ANGIOPLASTY Right 09/05/2017   Procedure:  PERIPHERAL VASCULAR BALLOON ANGIOPLASTY;  Surgeon: Angelia Mould, MD;  Location: [place] [facility];  Service: Cardiovascular;  Laterality: Right;   PROSTATE SURGERY     TIBIA FRACTURE SURGERY     Left    Current Outpatient Medications  Medication Sig Dispense Refill   acetaminophen (TYLENOL) 650 MG CR tablet Take 650 mg by mouth daily as needed for pain.      amiodarone (PACERONE) 200 MG tablet Take 1 tablet (200 mg total) by mouth 2 (two) times daily. 60 tablet 1   calcitRIOL (ROCALTROL) 0.5 MCG capsule Take 0.5 mcg by mouth 3 (three) times daily.     cinacalcet (SENSIPAR) 30 MG tablet Take 30 mg by mouth daily.     lidocaine-prilocaine (EMLA) cream Apply 1 application topically as needed (prior to dialysis).      methocarbamol (ROBAXIN) 500 MG tablet Take 500 mg by mouth at bedtime.     nitroGLYCERIN (NITROSTAT) 0.4 MG SL tablet Place 1 tablet (0.4 mg total) under the tongue as directed. 25 tablet 2   pantoprazole (PROTONIX) 40 MG tablet Take 1 tablet (40 mg total) by mouth daily. 30 tablet 1   potassium chloride SA (KLOR-CON M) 20 MEQ tablet Take 20 mEq by mouth 3 (three) times daily.     sodium bicarbonate 650 MG tablet Take 1,300 mg by mouth 3 (three) times daily.      tamsulosin (FLOMAX) 0.4 MG CAPS capsule Take 1 capsule by mouth daily as needed.     No current facility-administered medications for this encounter.    No Known Allergies  Social History   Socioeconomic History   Marital status: Single    Spouse name: Not on file   Number of children: Not on file   Years of education: Not on file   Highest education level: Not on file  Occupational History   Not on file  Tobacco Use   Smoking status: Former    Packs/day: 1.00    Years: 25.00    Pack years: 25.00    Types: Cigarettes    Start date: 07/07/1981    Quit date: 07/22/1998    Years since quitting: 23.2   Smokeless tobacco: Never   Tobacco comments:    Former smoker 09/05/2021  Vaping Use   Vaping Use: Never used  Substance and Sexual Activity   Alcohol use: No    Alcohol/week: 0.0 standard drinks   Drug use: No   Sexual activity: Not on file  Other Topics Concern   Not on file  Social History Narrative   Not on file   Social Determinants of Health   Financial Resource Strain: Not on file  Food Insecurity: Not on file  Transportation Needs: Not on file   Physical Activity: Not on file  Stress: Not on file  Social Connections: Not on file  Intimate Partner Violence: Not on file     ROS- All systems are reviewed and negative except as per the HPI above.  Physical Exam: Vitals:   09/27/21 1023  BP: 120/82  Pulse: (!) 113  Weight: 62.6 kg  Height: 6' (1.829 m)    GEN- The patient is a well appearing male, alert and oriented x 3 today.   HEENT-head normocephalic, atraumatic, sclera clear, conjunctiva pink, hearing intact, trachea midline. Lungs- Clear to ausculation bilaterally, normal work of breathing Heart- irregular rate and rhythm, no murmurs, rubs or gallops  GI- soft, NT, ND, + BS Extremities- no clubbing, cyanosis, or edema MS- no significant  deformity or atrophy Skin- no rash or lesion Psych- euthymic mood, full affect Neuro- strength and sensation are intact   Wt Readings from Last 3 Encounters:  09/27/21 62.6 kg  09/05/21 62.6 kg  08/31/21 73.5 kg    EKG today demonstrates  Atrial fibrillation with elevated rates Vent. rate 113 BPM PR interval * ms QRS duration 86 ms QT/QTcB 356/488 ms  Echo 08/09/21 demonstrated   1. Left ventricular ejection fraction, by estimation, is 30 to 35%. The  left ventricle has moderately decreased function. The left ventricle  demonstrates global hypokinesis. There is mild left ventricular  hypertrophy. Left ventricular diastolic parameters are indeterminate.   2. Right ventricular systolic function is normal. The right ventricular  size is normal. There is normal pulmonary artery systolic pressure.   3. Left atrial size was severely dilated.   4. The pericardial effusion is circumferential.   5. The MR vena contracta is 0.37 cm. . The mitral valve is abnormal. Mild to moderate mitral valve regurgitation. No evidence of mitral stenosis.   6. The tricuspid valve is abnormal. Tricuspid valve regurgitation is  moderate.   7. The aortic valve is tricuspid. There is mild  calcification of the  aortic valve. There is mild thickening of the aortic valve. Aortic valve  regurgitation is moderate. No aortic stenosis is present.   8. The inferior vena cava is normal in size with greater than 50%  respiratory variability, suggesting right atrial pressure of 3 mmHg.   Epic records are reviewed at length today  CHA2DS2-VASc Score = 4  The patient's score is based upon: CHF History: 1 HTN History: 1 Diabetes History: 0 Stroke History: 0 Vascular Disease History: 1 Age Score: 1 Gender Score: 0       ASSESSMENT AND PLAN: 1. Persistent Atrial Fibrillation/atrial flutter The patient's CHA2DS2-VASc score is 4, indicating a 4.8% annual risk of stroke.   Scheduled for DCCV today but had stopped his Eliquis a week prior.  We discussed the importance of anticoagulation and that he will need it lifelong based on his stroke risk. Patient voices understanding.  Will rescheduled DCCV after 3 weeks of uninterrupted anticoagulation.  Reduce amiodarone 200 mg daily Resume Eliquis 5 mg BID (age < 29, weight < 60 kg) Start Toprol 50 mg daily for rate control.  Ultimately, if he can not be compliant with his medications, rate control may be the best strategy.   2. Secondary Hypercoagulable State (ICD10:  D68.69) The patient is at significant risk for stroke/thromboembolism based upon his CHA2DS2-VASc Score of 4.  Continue Apixaban (Eliquis).   3. ESRD On HD Tu/Th/Sat  4. CAD No anginal symptoms.  5. Chronic HFrEF EF 30-35% No signs or symptoms of fluid overload  6. Valvular heart disease Mild-mod MR Moderate AI  7. HTN Stable, med changes as above.    Follow up in the AF clinic as scheduled.    [facility] [address] [place], [place] [address] [phone] 09/27/2021 10:27 AM

## 2021-09-27 NOTE — Anesthesia Preprocedure Evaluation (Deleted)
Anesthesia Evaluation  ? ? ?Reviewed: ?Allergy & Precautions, Patient's Chart, lab work & pertinent test results, reviewed documented beta blocker date and time  ? ?Airway ? ? ? ? ? ? ? Dental ?  ?Pulmonary ?neg pulmonary ROS, former smoker,  ?  ? ? ? ? ? ? ? Cardiovascular ?hypertension, Pt. on home beta blockers and Pt. on medications ?+ CAD and + Peripheral Vascular Disease  ?+ dysrhythmias Atrial Fibrillation + Valvular Problems/Murmurs AI  ? ?TTE 2023 ??1. Left ventricular ejection fraction, by estimation, is 30 to 35%. The  ?left ventricle has moderately decreased function. The left ventricle  ?demonstrates global hypokinesis. There is mild left ventricular  ?hypertrophy. Left ventricular diastolic  ?parameters are indeterminate.  ??2. Right ventricular systolic function is normal. The right ventricular  ?size is normal. There is normal pulmonary artery systolic pressure.  ??3. Left atrial size was severely dilated.  ??4. The pericardial effusion is circumferential.  ??5. The MR vena contracta is 0.37 cm. . The mitral valve is abnormal. Mild  ?to moderate mitral valve regurgitation. No evidence of mitral stenosis.  ??6. The tricuspid valve is abnormal. Tricuspid valve regurgitation is  ?moderate.  ??7. The aortic valve is tricuspid. There is mild calcification of the  ?aortic valve. There is mild thickening of the aortic valve. Aortic valve  ?regurgitation is moderate. No aortic stenosis is present.  ??8. The inferior vena cava is normal in size with greater than 50%  ?respiratory variability, suggesting right atrial pressure of 3 mmHg.  ?  ?Neuro/Psych ?negative neurological ROS ? negative psych ROS  ? GI/Hepatic ?negative GI ROS, Neg liver ROS,   ?Endo/Other  ?negative endocrine ROS ? Renal/GU ?ESRF and DialysisRenal disease  ?negative genitourinary ?  ?Musculoskeletal ?negative musculoskeletal ROS ?(+)  ? Abdominal ?  ?Peds ? Hematology ? ?(+) Blood dyscrasia (on eliquis),  ,   ?Anesthesia Other Findings ? ? Reproductive/Obstetrics ? ?  ? ? ? ? ? ? ? ? ? ? ? ? ? ?  ?  ? ? ? ? ? ? ? ? ?Anesthesia Physical ?Anesthesia Plan ? ?ASA: 3 ? ?Anesthesia Plan: General  ? ?Post-op Pain Management:   ? ?Induction: Intravenous ? ?PONV Risk Score and Plan: Propofol infusion and Treatment may vary due to age or medical condition ? ?Airway Management Planned: Natural Airway ? ?Additional Equipment:  ? ?Intra-op Plan:  ? ?Post-operative Plan:  ? ?Informed Consent: I have reviewed the patients History and Physical, chart, labs and discussed the procedure including the risks, benefits and alternatives for the proposed anesthesia with the patient or authorized representative who has indicated his/her understanding and acceptance.  ? ? ? ?Dental advisory given ? ?Plan Discussed with: CRNA ? ?Anesthesia Plan Comments: (Procedure cancelled as pt has not taken eliquis in a week )  ? ? ? ? ? ?Anesthesia Quick Evaluation ? ?

## 2021-09-27 NOTE — Patient Instructions (Addendum)
START Eliquis 5mg  twice a day - do not miss any doses of this going forward ? ?DECREASE Amiodarone to 200mg  once a day ? ?START metoprolol 50mg  once a day ? ? ?Cardioversion scheduled for Monday, April 3rd ? - come to afib clinic at [facility]" at 930am ? - Arrive at the [facility] and go to admitting at 10am ? - Do not eat or drink anything after midnight the night prior to your procedure. ? - Take all your morning medication (except diabetic medications) with a sip of water prior to arrival. ? - You will not be able to drive home after your procedure. ? - Do NOT miss any doses of your blood thinner - if you should miss a dose please notify our office immediately. ? - If you feel as if you go back into normal rhythm prior to scheduled cardioversion, please notify our office immediately. If your procedure is canceled in the cardioversion suite you will be charged a cancellation fee. ? ?

## 2021-09-27 NOTE — Progress Notes (Signed)
Primary Care Physician: Neale Burly, MD Primary Cardiologist: Dr Domenic Polite Primary Electrophysiologist: none Referring Physician: Dr Farrel Gobble is a 66 y.o. male with a history of AI, MR, CAD, ESRD, HTN, PVD, HFrEF, atrial fibrillation who presents for consultation in the [facility].  The patient was initially diagnosed with atrial fibrillation 02/13/21 after presenting to the cardiology office for routine follow up. He was started on Eliquis for a CHADS2VASC score of 4 and diltiazem for rate control. At that time, patient preferred a rate control strategy. He was hospitalized 08/09/21 for general malaise, palpitations, and SOB. He had not been compliant with HD. His Hgb at that time was 6.9 and he was transfused 2 units of PRBC. His heart rates were elevated at that time and he was started on carvedilol and amiodarone. Echo showed EF 30-35%.   On follow up today, patient was scheduled for a DCCV today but this was cancelled as he had self stopped his Eliquis again. He mistakenly thought he only had to take anticoagulation for 3 weeks. He reports that he feels well today. He has some dyspnea with exertion but this is mild. He also stopped metoprolol for an unknown reason.   Today, he denies symptoms of palpitations, chest pain, orthopnea, PND, lower extremity edema, dizziness, presyncope, syncope, snoring, daytime somnolence, bleeding, or neurologic sequela. The patient is tolerating medications without difficulties and is otherwise without complaint today.    Atrial Fibrillation Risk Factors:  he does not have symptoms or diagnosis of sleep apnea. he does not have a history of rheumatic fever. he does not have a history of alcohol use.  he has a BMI of Body mass index is 18.72 kg/m.Marland Kitchen Filed Weights   09/27/21 1023  Weight: 62.6 kg     Family History  Problem Relation Age of Onset   Hypertension Mother    Heart disease Mother    AAA  (abdominal aortic aneurysm) Mother    Hypertension Father    Cerebral aneurysm Father    Cancer Other    Coronary artery disease Other      Atrial Fibrillation Management history:  Previous antiarrhythmic drugs: amiodarone  Previous cardioversions: none Previous ablations: none CHADS2VASC score: 4 Anticoagulation history: Eliquis   Past Medical History:  Diagnosis Date   Anemia of chronic disease    Aortic insufficiency    Mild to moderate   Coronary atherosclerosis of native coronary artery    Nonobstructive at catheterization 2004   ESRD on hemodialysis [facility])    [name]   Essential hypertension    Peripheral vascular disease [facility])    Past Surgical History:  Procedure Laterality Date   A/V FISTULAGRAM Right 09/05/2017   Procedure: A/V FISTULAGRAM;  Surgeon: Angelia Mould, MD;  Location: [place] [facility];  Service: Cardiovascular;  Laterality: Right;   AV FISTULA PLACEMENT Right 12/17/2016   Procedure: ARTERIOVENOUS (AV) FISTULA CREATION- RIGHT BRACHIOCEPHALIC;  Surgeon: Elam Dutch, MD;  Location: [place];  Service: Vascular;  Laterality: Right;   CAPD INSERTION N/A 11/03/2017   Procedure: LAPAROSCOPIC INSERTION CONTINUOUS AMBULATORY PERITONEAL DIALYSIS  (CAPD) CATHETER;  Surgeon: Clovis Riley, MD;  Location: [place];  Service: General;  Laterality: N/A;   CARDIAC [facility]ATION     [facility]   IR DIALY SHUNT INTRO [place] W/IMG RIGHT Right 04/24/2017   IR US GUIDE VASC ACCESS RIGHT  04/24/2017   PERIPHERAL VASCULAR BALLOON ANGIOPLASTY Right 09/05/2017   Procedure:  PERIPHERAL VASCULAR BALLOON ANGIOPLASTY;  Surgeon: Angelia Mould, MD;  Location: [place] [facility];  Service: Cardiovascular;  Laterality: Right;   PROSTATE SURGERY     TIBIA FRACTURE SURGERY     Left    Current Outpatient Medications  Medication Sig Dispense Refill   acetaminophen (TYLENOL) 650 MG CR tablet Take 650 mg by mouth daily as needed for pain.      amiodarone (PACERONE) 200 MG tablet Take 1 tablet (200 mg total) by mouth 2 (two) times daily. 60 tablet 1   calcitRIOL (ROCALTROL) 0.5 MCG capsule Take 0.5 mcg by mouth 3 (three) times daily.     cinacalcet (SENSIPAR) 30 MG tablet Take 30 mg by mouth daily.     lidocaine-prilocaine (EMLA) cream Apply 1 application topically as needed (prior to dialysis).      methocarbamol (ROBAXIN) 500 MG tablet Take 500 mg by mouth at bedtime.     nitroGLYCERIN (NITROSTAT) 0.4 MG SL tablet Place 1 tablet (0.4 mg total) under the tongue as directed. 25 tablet 2   pantoprazole (PROTONIX) 40 MG tablet Take 1 tablet (40 mg total) by mouth daily. 30 tablet 1   potassium chloride SA (KLOR-CON M) 20 MEQ tablet Take 20 mEq by mouth 3 (three) times daily.     sodium bicarbonate 650 MG tablet Take 1,300 mg by mouth 3 (three) times daily.      tamsulosin (FLOMAX) 0.4 MG CAPS capsule Take 1 capsule by mouth daily as needed.     No current facility-administered medications for this encounter.    No Known Allergies  Social History   Socioeconomic History   Marital status: Single    Spouse name: Not on file   Number of children: Not on file   Years of education: Not on file   Highest education level: Not on file  Occupational History   Not on file  Tobacco Use   Smoking status: Former    Packs/day: 1.00    Years: 25.00    Pack years: 25.00    Types: Cigarettes    Start date: 07/07/1981    Quit date: 07/22/1998    Years since quitting: 23.2   Smokeless tobacco: Never   Tobacco comments:    Former smoker 09/05/2021  Vaping Use   Vaping Use: Never used  Substance and Sexual Activity   Alcohol use: No    Alcohol/week: 0.0 standard drinks   Drug use: No   Sexual activity: Not on file  Other Topics Concern   Not on file  Social History Narrative   Not on file   Social Determinants of Health   Financial Resource Strain: Not on file  Food Insecurity: Not on file  Transportation Needs: Not on file   Physical Activity: Not on file  Stress: Not on file  Social Connections: Not on file  Intimate Partner Violence: Not on file     ROS- All systems are reviewed and negative except as per the HPI above.  Physical Exam: Vitals:   09/27/21 1023  BP: 120/82  Pulse: (!) 113  Weight: 62.6 kg  Height: 6' (1.829 m)    GEN- The patient is a well appearing male, alert and oriented x 3 today.   HEENT-head normocephalic, atraumatic, sclera clear, conjunctiva pink, hearing intact, trachea midline. Lungs- Clear to ausculation bilaterally, normal work of breathing Heart- irregular rate and rhythm, no murmurs, rubs or gallops  GI- soft, NT, ND, + BS Extremities- no clubbing, cyanosis, or edema MS- no significant  deformity or atrophy Skin- no rash or lesion Psych- euthymic mood, full affect Neuro- strength and sensation are intact   Wt Readings from Last 3 Encounters:  09/27/21 62.6 kg  09/05/21 62.6 kg  08/31/21 73.5 kg    EKG today demonstrates  Atrial fibrillation with elevated rates Vent. rate 113 BPM PR interval * ms QRS duration 86 ms QT/QTcB 356/488 ms  Echo 08/09/21 demonstrated   1. Left ventricular ejection fraction, by estimation, is 30 to 35%. The  left ventricle has moderately decreased function. The left ventricle  demonstrates global hypokinesis. There is mild left ventricular  hypertrophy. Left ventricular diastolic parameters are indeterminate.   2. Right ventricular systolic function is normal. The right ventricular  size is normal. There is normal pulmonary artery systolic pressure.   3. Left atrial size was severely dilated.   4. The pericardial effusion is circumferential.   5. The MR vena contracta is 0.37 cm. . The mitral valve is abnormal. Mild to moderate mitral valve regurgitation. No evidence of mitral stenosis.   6. The tricuspid valve is abnormal. Tricuspid valve regurgitation is  moderate.   7. The aortic valve is tricuspid. There is mild  calcification of the  aortic valve. There is mild thickening of the aortic valve. Aortic valve  regurgitation is moderate. No aortic stenosis is present.   8. The inferior vena cava is normal in size with greater than 50%  respiratory variability, suggesting right atrial pressure of 3 mmHg.   Epic records are reviewed at length today  CHA2DS2-VASc Score = 4  The patient's score is based upon: CHF History: 1 HTN History: 1 Diabetes History: 0 Stroke History: 0 Vascular Disease History: 1 Age Score: 1 Gender Score: 0       ASSESSMENT AND PLAN: 1. Persistent Atrial Fibrillation/atrial flutter The patient's CHA2DS2-VASc score is 4, indicating a 4.8% annual risk of stroke.   Scheduled for DCCV today but had stopped his Eliquis a week prior.  We discussed the importance of anticoagulation and that he will need it lifelong based on his stroke risk. Patient voices understanding.  Will rescheduled DCCV after 3 weeks of uninterrupted anticoagulation.  Reduce amiodarone 200 mg daily Resume Eliquis 5 mg BID (age < 73, weight < 60 kg) Start Toprol 50 mg daily for rate control.  Ultimately, if he can not be compliant with his medications, rate control may be the best strategy.   2. Secondary Hypercoagulable State (ICD10:  D68.69) The patient is at significant risk for stroke/thromboembolism based upon his CHA2DS2-VASc Score of 4.  Continue Apixaban (Eliquis).   3. ESRD On HD Tu/Th/Sat  4. CAD No anginal symptoms.  5. Chronic HFrEF EF 30-35% No signs or symptoms of fluid overload  6. Valvular heart disease Mild-mod MR Moderate AI  7. HTN Stable, med changes as above.    Follow up in the AF clinic as scheduled.    [facility] [address] [place], [place] [address] [phone] 09/27/2021 10:27 AM

## 2021-09-27 NOTE — Progress Notes (Signed)
Case canceled due to patient not taking eliquis for one week ?

## 2021-09-29 DIAGNOSIS — D631 Anemia in chronic kidney disease: Secondary | ICD-10-CM | POA: Diagnosis not present

## 2021-09-29 DIAGNOSIS — N2581 Secondary hyperparathyroidism of renal origin: Secondary | ICD-10-CM | POA: Diagnosis not present

## 2021-09-29 DIAGNOSIS — D509 Iron deficiency anemia, unspecified: Secondary | ICD-10-CM | POA: Diagnosis not present

## 2021-09-29 DIAGNOSIS — Z992 Dependence on renal dialysis: Secondary | ICD-10-CM | POA: Diagnosis not present

## 2021-09-29 DIAGNOSIS — N186 End stage renal disease: Secondary | ICD-10-CM | POA: Diagnosis not present

## 2021-09-29 NOTE — Interval H&P Note (Signed)
History and Physical Interval Note: ? ?09/29/2021 ?8:21 AM ? ?Rodney Gordon  has presented today for surgery, with the diagnosis of AFIB.  The procedure was canceled since there had been an interruption in anticoagulation. Will reschedule via AFib clinic. ? ? ?Rodney Gordon ? ? ?

## 2021-10-02 DIAGNOSIS — N2581 Secondary hyperparathyroidism of renal origin: Secondary | ICD-10-CM | POA: Diagnosis not present

## 2021-10-02 DIAGNOSIS — N186 End stage renal disease: Secondary | ICD-10-CM | POA: Diagnosis not present

## 2021-10-02 DIAGNOSIS — D509 Iron deficiency anemia, unspecified: Secondary | ICD-10-CM | POA: Diagnosis not present

## 2021-10-02 DIAGNOSIS — Z992 Dependence on renal dialysis: Secondary | ICD-10-CM | POA: Diagnosis not present

## 2021-10-02 DIAGNOSIS — D631 Anemia in chronic kidney disease: Secondary | ICD-10-CM | POA: Diagnosis not present

## 2021-10-03 ENCOUNTER — Ambulatory Visit (HOSPITAL_COMMUNITY): Payer: Medicare Other | Admitting: Physician Assistant

## 2021-10-04 DIAGNOSIS — N2581 Secondary hyperparathyroidism of renal origin: Secondary | ICD-10-CM | POA: Diagnosis not present

## 2021-10-04 DIAGNOSIS — Z992 Dependence on renal dialysis: Secondary | ICD-10-CM | POA: Diagnosis not present

## 2021-10-04 DIAGNOSIS — D631 Anemia in chronic kidney disease: Secondary | ICD-10-CM | POA: Diagnosis not present

## 2021-10-04 DIAGNOSIS — D509 Iron deficiency anemia, unspecified: Secondary | ICD-10-CM | POA: Diagnosis not present

## 2021-10-04 DIAGNOSIS — N186 End stage renal disease: Secondary | ICD-10-CM | POA: Diagnosis not present

## 2021-10-06 DIAGNOSIS — Z992 Dependence on renal dialysis: Secondary | ICD-10-CM | POA: Diagnosis not present

## 2021-10-06 DIAGNOSIS — D509 Iron deficiency anemia, unspecified: Secondary | ICD-10-CM | POA: Diagnosis not present

## 2021-10-06 DIAGNOSIS — N2581 Secondary hyperparathyroidism of renal origin: Secondary | ICD-10-CM | POA: Diagnosis not present

## 2021-10-06 DIAGNOSIS — D631 Anemia in chronic kidney disease: Secondary | ICD-10-CM | POA: Diagnosis not present

## 2021-10-06 DIAGNOSIS — N186 End stage renal disease: Secondary | ICD-10-CM | POA: Diagnosis not present

## 2021-10-09 DIAGNOSIS — N2581 Secondary hyperparathyroidism of renal origin: Secondary | ICD-10-CM | POA: Diagnosis not present

## 2021-10-09 DIAGNOSIS — D509 Iron deficiency anemia, unspecified: Secondary | ICD-10-CM | POA: Diagnosis not present

## 2021-10-09 DIAGNOSIS — D631 Anemia in chronic kidney disease: Secondary | ICD-10-CM | POA: Diagnosis not present

## 2021-10-09 DIAGNOSIS — Z992 Dependence on renal dialysis: Secondary | ICD-10-CM | POA: Diagnosis not present

## 2021-10-09 DIAGNOSIS — N186 End stage renal disease: Secondary | ICD-10-CM | POA: Diagnosis not present

## 2021-10-10 DIAGNOSIS — N186 End stage renal disease: Secondary | ICD-10-CM | POA: Diagnosis not present

## 2021-10-10 DIAGNOSIS — N400 Enlarged prostate without lower urinary tract symptoms: Secondary | ICD-10-CM | POA: Diagnosis not present

## 2021-10-10 DIAGNOSIS — I4891 Unspecified atrial fibrillation: Secondary | ICD-10-CM | POA: Diagnosis not present

## 2021-10-10 DIAGNOSIS — M818 Other osteoporosis without current pathological fracture: Secondary | ICD-10-CM | POA: Diagnosis not present

## 2021-10-10 DIAGNOSIS — I5020 Unspecified systolic (congestive) heart failure: Secondary | ICD-10-CM | POA: Diagnosis not present

## 2021-10-10 DIAGNOSIS — I10 Essential (primary) hypertension: Secondary | ICD-10-CM | POA: Diagnosis not present

## 2021-10-11 DIAGNOSIS — D631 Anemia in chronic kidney disease: Secondary | ICD-10-CM | POA: Diagnosis not present

## 2021-10-11 DIAGNOSIS — D509 Iron deficiency anemia, unspecified: Secondary | ICD-10-CM | POA: Diagnosis not present

## 2021-10-11 DIAGNOSIS — Z992 Dependence on renal dialysis: Secondary | ICD-10-CM | POA: Diagnosis not present

## 2021-10-11 DIAGNOSIS — N186 End stage renal disease: Secondary | ICD-10-CM | POA: Diagnosis not present

## 2021-10-11 DIAGNOSIS — N2581 Secondary hyperparathyroidism of renal origin: Secondary | ICD-10-CM | POA: Diagnosis not present

## 2021-10-12 ENCOUNTER — Ambulatory Visit (HOSPITAL_COMMUNITY)
Admission: RE | Admit: 2021-10-12 | Discharge: 2021-10-12 | Disposition: A | Discharge status: Home / Self Care | Payer: Medicare Other | Source: Ambulatory Visit | Attending: Physician Assistant | Admitting: Physician Assistant

## 2021-10-12 ENCOUNTER — Encounter (HOSPITAL_COMMUNITY): Payer: Self-pay | Admitting: Physician Assistant

## 2021-10-12 ENCOUNTER — Encounter (HOSPITAL_COMMUNITY): Payer: Self-pay | Admitting: Cardiology

## 2021-10-12 ENCOUNTER — Other Ambulatory Visit: Payer: Self-pay

## 2021-10-12 VITALS — BP 120/78 | HR 114 | Ht 72.0 in | Wt 138.0 lb

## 2021-10-12 DIAGNOSIS — I2510 Atherosclerotic heart disease of native coronary artery without angina pectoris: Secondary | ICD-10-CM | POA: Diagnosis not present

## 2021-10-12 DIAGNOSIS — I739 Peripheral vascular disease, unspecified: Secondary | ICD-10-CM | POA: Diagnosis not present

## 2021-10-12 DIAGNOSIS — Z7901 Long term (current) use of anticoagulants: Secondary | ICD-10-CM | POA: Insufficient documentation

## 2021-10-12 DIAGNOSIS — I5022 Chronic systolic (congestive) heart failure: Secondary | ICD-10-CM | POA: Diagnosis not present

## 2021-10-12 DIAGNOSIS — I4892 Unspecified atrial flutter: Secondary | ICD-10-CM | POA: Diagnosis not present

## 2021-10-12 DIAGNOSIS — I38 Endocarditis, valve unspecified: Secondary | ICD-10-CM | POA: Insufficient documentation

## 2021-10-12 DIAGNOSIS — D6869 Other thrombophilia: Secondary | ICD-10-CM | POA: Diagnosis not present

## 2021-10-12 DIAGNOSIS — I132 Hypertensive heart and chronic kidney disease with heart failure and with stage 5 chronic kidney disease, or end stage renal disease: Secondary | ICD-10-CM | POA: Insufficient documentation

## 2021-10-12 DIAGNOSIS — Z79899 Other long term (current) drug therapy: Secondary | ICD-10-CM | POA: Insufficient documentation

## 2021-10-12 DIAGNOSIS — I4819 Other persistent atrial fibrillation: Secondary | ICD-10-CM | POA: Diagnosis not present

## 2021-10-12 DIAGNOSIS — N186 End stage renal disease: Secondary | ICD-10-CM | POA: Insufficient documentation

## 2021-10-12 LAB — COMPREHENSIVE METABOLIC PANEL
ALT: 24 U/L (ref 0–44)
AST: 33 U/L (ref 15–41)
Albumin: 2.1 g/dL — ABNORMAL LOW (ref 3.5–5.0)
Alkaline Phosphatase: 124 U/L (ref 38–126)
Anion gap: 8 (ref 5–15)
BUN: 15 mg/dL (ref 8–23)
CO2: 26 mmol/L (ref 22–32)
Calcium: 9.1 mg/dL (ref 8.9–10.3)
Chloride: 103 mmol/L (ref 98–111)
Creatinine, Ser: 4.95 mg/dL — ABNORMAL HIGH (ref 0.61–1.24)
GFR, Estimated: 12 mL/min — ABNORMAL LOW (ref >60)
Glucose, Bld: 94 mg/dL (ref 70–99)
Potassium: 3.4 mmol/L — ABNORMAL LOW (ref 3.5–5.1)
Sodium: 137 mmol/L (ref 135–145)
Total Bilirubin: 1.6 mg/dL — ABNORMAL HIGH (ref 0.3–1.2)
Total Protein: 7.1 g/dL (ref 6.5–8.1)

## 2021-10-12 LAB — CBC
HCT: 31.6 % — ABNORMAL LOW (ref 39.0–52.0)
Hemoglobin: 9.6 g/dL — ABNORMAL LOW (ref 13.0–17.0)
MCH: 31.9 pg (ref 26.0–34.0)
MCHC: 30.4 g/dL (ref 30.0–36.0)
MCV: 105 fL — ABNORMAL HIGH (ref 80.0–100.0)
Platelets: 325 10*3/uL (ref 150–400)
RBC: 3.01 MIL/uL — ABNORMAL LOW (ref 4.22–5.81)
RDW: 14.9 % (ref 11.5–15.5)
WBC: 7.1 10*3/uL (ref 4.0–10.5)
nRBC: 0 % (ref 0.0–0.2)

## 2021-10-12 LAB — TSH: TSH: 4.029 u[IU]/mL (ref 0.350–4.500)

## 2021-10-12 NOTE — Patient Instructions (Signed)
Cardioversion scheduled for Monday, April 3rd ?            - Arrive at the [facility] and go to admitting at 10am ?            - Do not eat or drink anything after midnight the night prior to your procedure. ?            - Take all your morning medication (except diabetic medications) with a sip of water prior to arrival. ?            - You will not be able to drive home after your procedure. ?            - Do NOT miss any doses of your blood thinner - if you should miss a dose please notify our office immediately. ?            - If you feel as if you go back into normal rhythm prior to scheduled cardioversion, please notify our office immediately. If your procedure is canceled in the cardioversion suite you will be charged a cancellation fee. ?

## 2021-10-12 NOTE — Progress Notes (Addendum)
? ? ?Primary Care Physician: Neale Burly, MD ?Primary Cardiologist: Dr Domenic Polite ?Primary Electrophysiologist: none ?Referring Physician: Dr Domenic Polite ? ? ?Rodney Gordon is a 66 y.o. male with a history of AI, MR, CAD, ESRD, HTN, PVD, HFrEF, atrial fibrillation who presents for consultation in the [facility].  The patient was initially diagnosed with atrial fibrillation 02/13/21 after presenting to the cardiology office for routine follow up. He was started on Eliquis for a CHADS2VASC score of 4 and diltiazem for rate control. At that time, patient preferred a rate control strategy. He was hospitalized 08/09/21 for general malaise, palpitations, and SOB. He had not been compliant with HD. His Hgb at that time was 6.9 and he was transfused 2 units of PRBC. His heart rates were elevated at that time and he was started on carvedilol and amiodarone. Echo showed EF 30-35%.  ? ?Patient was scheduled for a DCCV 09/27/21 but this was cancelled as he had self stopped his Eliquis again. He mistakenly thought he only had to take anticoagulation for 3 weeks. He also stopped metoprolol for an unknown reason.  ? ?On follow up today, patient reports that he has done well since his last visit. He reports that he has been compliant taking all his medications as prescribed. His SOB has improved. He denies any bleeding issues on anticoagulation.  ? ?Today, he denies symptoms of chest pain, orthopnea, PND, lower extremity edema, dizziness, presyncope, syncope, snoring, daytime somnolence, bleeding, or neurologic sequela. The patient is tolerating medications without difficulties and is otherwise without complaint today.  ? ? ?Atrial Fibrillation Risk Factors: ? ?he does not have symptoms or diagnosis of sleep apnea. ?he does not have a history of rheumatic fever. ?he does not have a history of alcohol use. ? ?he has a BMI of Body mass index is 18.72 kg/m?Marland KitchenMarland Kitchen ?Filed Weights  ? 10/12/21 1030  ?Weight: 62.6 kg   ? ? ? ? ?Family History  ?Problem Relation Age of Onset  ? Hypertension Mother   ? Heart disease Mother   ? AAA (abdominal aortic aneurysm) Mother   ? Hypertension Father   ? Cerebral aneurysm Father   ? Cancer Other   ? Coronary artery disease Other   ? ? ? ?Atrial Fibrillation Management history: ? ?Previous antiarrhythmic drugs: amiodarone  ?Previous cardioversions: none ?Previous ablations: none ?CHADS2VASC score: 4 ?Anticoagulation history: Eliquis ? ? ?Past Medical History:  ?Diagnosis Date  ? Anemia of chronic disease   ? Aortic insufficiency   ? Mild to moderate  ? Coronary atherosclerosis of native coronary artery   ? Nonobstructive at catheterization 2004  ? ESRD on hemodialysis ([place])   ? [name]  ? Essential hypertension   ? Peripheral vascular disease ([place])   ? ?Past Surgical History:  ?Procedure Laterality Date  ? A/V FISTULAGRAM Right 09/05/2017  ? Procedure: A/V FISTULAGRAM;  Surgeon: Angelia Mould, MD;  Location: [place] [facility];  Service: Cardiovascular;  Laterality: Right;  ? AV FISTULA PLACEMENT Right 12/17/2016  ? Procedure: ARTERIOVENOUS (AV) FISTULA CREATION- RIGHT BRACHIOCEPHALIC;  Surgeon: Elam Dutch, MD;  Location: [place];  Service: Vascular;  Laterality: Right;  ? CAPD INSERTION N/A 11/03/2017  ? Procedure: LAPAROSCOPIC INSERTION CONTINUOUS AMBULATORY PERITONEAL DIALYSIS  (CAPD) CATHETER;  Surgeon: Clovis Riley, MD;  Location: [place];  Service: General;  Laterality: N/A;  ? CARDIAC CATHETERIZATION    ? [facility]  ? IR DIALY SHUNT INTRO [place]/INTRACATH INITIAL W/IMG RIGHT Right 04/24/2017  ?  IR US GUIDE VASC ACCESS RIGHT  04/24/2017  ? PERIPHERAL VASCULAR BALLOON ANGIOPLASTY Right 09/05/2017  ? Procedure: PERIPHERAL VASCULAR BALLOON ANGIOPLASTY;  Surgeon: Angelia Mould, MD;  Location: [place] [facility];  Service: Cardiovascular;  Laterality: Right;  ? PROSTATE SURGERY    ? TIBIA FRACTURE SURGERY    ? Left  ? ? ?Current Outpatient Medications  ?Medication Sig  Dispense Refill  ? acetaminophen (TYLENOL) 650 MG CR tablet Take 650 mg by mouth daily as needed for pain.    ? amiodarone (PACERONE) 200 MG tablet Take 1 tablet (200 mg total) by mouth daily. 60 tablet 1  ? apixaban (ELIQUIS) 5 MG TABS tablet Take 1 tablet (5 mg total) by mouth 2 (two) times daily. 60 tablet 3  ? calcitRIOL (ROCALTROL) 0.5 MCG capsule Take 0.5 mcg by mouth 3 (three) times daily.    ? cinacalcet (SENSIPAR) 30 MG tablet Take 30 mg by mouth daily.    ? lidocaine-prilocaine (EMLA) cream Apply 1 application topically as needed (prior to dialysis).     ? methocarbamol (ROBAXIN) 500 MG tablet Take 500 mg by mouth at bedtime.    ? metoprolol succinate (TOPROL XL) 50 MG 24 hr tablet Take 1 tablet (50 mg total) by mouth daily. Take with or immediately following a meal. 30 tablet 3  ? nitroGLYCERIN (NITROSTAT) 0.4 MG SL tablet Place 1 tablet (0.4 mg total) under the tongue as directed. 25 tablet 2  ? pantoprazole (PROTONIX) 40 MG tablet Take 1 tablet (40 mg total) by mouth daily. 30 tablet 1  ? potassium chloride SA (KLOR-CON M) 20 MEQ tablet Take 20 mEq by mouth 3 (three) times daily.    ? sodium bicarbonate 650 MG tablet Take 1,300 mg by mouth 3 (three) times daily.     ? tamsulosin (FLOMAX) 0.4 MG CAPS capsule Take 1 capsule by mouth daily as needed.    ? ?No current facility-administered medications for this encounter.  ? ? ?No Known Allergies ? ?Social History  ? ?Socioeconomic History  ? Marital status: Single  ?  Spouse name: Not on file  ? Number of children: Not on file  ? Years of education: Not on file  ? Highest education level: Not on file  ?Occupational History  ? Not on file  ?Tobacco Use  ? Smoking status: Former  ?  Packs/day: 1.00  ?  Years: 25.00  ?  Pack years: 25.00  ?  Types: Cigarettes  ?  Start date: 07/07/1981  ?  Quit date: 07/22/1998  ?  Years since quitting: 23.2  ? Smokeless tobacco: Never  ? Tobacco comments:  ?  Former smoker 09/05/2021  ?Vaping Use  ? Vaping Use: Never used   ?Substance and Sexual Activity  ? Alcohol use: No  ?  Alcohol/week: 0.0 standard drinks  ? Drug use: No  ? Sexual activity: Not on file  ?Other Topics Concern  ? Not on file  ?Social History Narrative  ? Not on file  ? ?Social Determinants of Health  ? ?Financial Resource Strain: Not on file  ?Food Insecurity: Not on file  ?Transportation Needs: Not on file  ?Physical Activity: Not on file  ?Stress: Not on file  ?Social Connections: Not on file  ?Intimate Partner Violence: Not on file  ? ? ? ?ROS- All systems are reviewed and negative except as per the HPI above. ? ?Physical Exam: ?Vitals:  ? 10/12/21 1030  ?BP: 120/78  ?Pulse: (!) 114  ?Weight: 62.6 kg  ?  Height: 6' (1.829 m)  ? ? ?GEN- The patient is a well appearing male, alert and oriented x 3 today.   ?HEENT-head normocephalic, atraumatic, sclera clear, conjunctiva pink, hearing intact, trachea midline. ?Lungs- Clear to ausculation bilaterally, normal work of breathing ?Heart- irregular rate and rhythm, no murmurs, rubs or gallops  ?GI- soft, NT, ND, + BS ?Extremities- no clubbing, cyanosis, or edema ?MS- no significant deformity or atrophy ?Skin- no rash or lesion ?Psych- euthymic mood, full affect ?Neuro- strength and sensation are intact ? ? ?Wt Readings from Last 3 Encounters:  ?10/12/21 62.6 kg  ?09/27/21 62.6 kg  ?09/05/21 62.6 kg  ? ? ?EKG today demonstrates  ?Afib ?Vent. rate 114 BPM ?PR interval * ms ?QRS duration 78 ms ?QT/QTcB 348/479 ms ? ?Echo 08/09/21 demonstrated  ? 1. Left ventricular ejection fraction, by estimation, is 30 to 35%. The  ?left ventricle has moderately decreased function. The left ventricle  ?demonstrates global hypokinesis. There is mild left ventricular  ?hypertrophy. Left ventricular diastolic parameters are indeterminate.  ? 2. Right ventricular systolic function is normal. The right ventricular  ?size is normal. There is normal pulmonary artery systolic pressure.  ? 3. Left atrial size was severely dilated.  ? 4. The  pericardial effusion is circumferential.  ? 5. The MR vena contracta is 0.37 cm. . The mitral valve is abnormal. Mild to moderate mitral valve regurgitation. No evidence of mitral stenosis.  ? 6. The tricuspid valve is

## 2021-10-12 NOTE — Progress Notes (Signed)
Attempted to obtain medical history via telephone, unable to reach at this time. Unable to leave voicemail to return pre surgical testing department's phone call,due to mailbox not set up.   

## 2021-10-13 DIAGNOSIS — Z992 Dependence on renal dialysis: Secondary | ICD-10-CM | POA: Diagnosis not present

## 2021-10-13 DIAGNOSIS — N186 End stage renal disease: Secondary | ICD-10-CM | POA: Diagnosis not present

## 2021-10-13 DIAGNOSIS — D631 Anemia in chronic kidney disease: Secondary | ICD-10-CM | POA: Diagnosis not present

## 2021-10-13 DIAGNOSIS — N2581 Secondary hyperparathyroidism of renal origin: Secondary | ICD-10-CM | POA: Diagnosis not present

## 2021-10-13 DIAGNOSIS — D509 Iron deficiency anemia, unspecified: Secondary | ICD-10-CM | POA: Diagnosis not present

## 2021-10-15 ENCOUNTER — Ambulatory Visit: Admit: 2021-10-15 | Payer: Medicare Other | Admitting: Vascular Surgery

## 2021-10-15 SURGERY — ARTERIOVENOUS (AV) FISTULA CREATION
Anesthesia: Monitor Anesthesia Care | Laterality: Right

## 2021-10-16 DIAGNOSIS — R051 Acute cough: Secondary | ICD-10-CM | POA: Diagnosis not present

## 2021-10-16 DIAGNOSIS — N2581 Secondary hyperparathyroidism of renal origin: Secondary | ICD-10-CM | POA: Diagnosis not present

## 2021-10-16 DIAGNOSIS — D509 Iron deficiency anemia, unspecified: Secondary | ICD-10-CM | POA: Diagnosis not present

## 2021-10-16 DIAGNOSIS — Z992 Dependence on renal dialysis: Secondary | ICD-10-CM | POA: Diagnosis not present

## 2021-10-16 DIAGNOSIS — Z20822 Contact with and (suspected) exposure to covid-19: Secondary | ICD-10-CM | POA: Diagnosis not present

## 2021-10-16 DIAGNOSIS — R059 Cough, unspecified: Secondary | ICD-10-CM | POA: Diagnosis not present

## 2021-10-16 DIAGNOSIS — D631 Anemia in chronic kidney disease: Secondary | ICD-10-CM | POA: Diagnosis not present

## 2021-10-16 DIAGNOSIS — N186 End stage renal disease: Secondary | ICD-10-CM | POA: Diagnosis not present

## 2021-10-18 DIAGNOSIS — D509 Iron deficiency anemia, unspecified: Secondary | ICD-10-CM | POA: Diagnosis not present

## 2021-10-18 DIAGNOSIS — N186 End stage renal disease: Secondary | ICD-10-CM | POA: Diagnosis not present

## 2021-10-18 DIAGNOSIS — N2581 Secondary hyperparathyroidism of renal origin: Secondary | ICD-10-CM | POA: Diagnosis not present

## 2021-10-18 DIAGNOSIS — D631 Anemia in chronic kidney disease: Secondary | ICD-10-CM | POA: Diagnosis not present

## 2021-10-18 DIAGNOSIS — Z992 Dependence on renal dialysis: Secondary | ICD-10-CM | POA: Diagnosis not present

## 2021-10-19 DIAGNOSIS — N186 End stage renal disease: Secondary | ICD-10-CM | POA: Diagnosis not present

## 2021-10-19 DIAGNOSIS — Z20822 Contact with and (suspected) exposure to covid-19: Secondary | ICD-10-CM | POA: Diagnosis not present

## 2021-10-19 DIAGNOSIS — Z992 Dependence on renal dialysis: Secondary | ICD-10-CM | POA: Diagnosis not present

## 2021-10-20 DIAGNOSIS — N186 End stage renal disease: Secondary | ICD-10-CM | POA: Diagnosis not present

## 2021-10-20 DIAGNOSIS — E559 Vitamin D deficiency, unspecified: Secondary | ICD-10-CM | POA: Diagnosis not present

## 2021-10-20 DIAGNOSIS — N250 Renal osteodystrophy: Secondary | ICD-10-CM | POA: Diagnosis not present

## 2021-10-20 DIAGNOSIS — D509 Iron deficiency anemia, unspecified: Secondary | ICD-10-CM | POA: Diagnosis not present

## 2021-10-20 DIAGNOSIS — Z992 Dependence on renal dialysis: Secondary | ICD-10-CM | POA: Diagnosis not present

## 2021-10-20 DIAGNOSIS — D631 Anemia in chronic kidney disease: Secondary | ICD-10-CM | POA: Diagnosis not present

## 2021-10-20 DIAGNOSIS — N2581 Secondary hyperparathyroidism of renal origin: Secondary | ICD-10-CM | POA: Diagnosis not present

## 2021-10-22 ENCOUNTER — Encounter (HOSPITAL_COMMUNITY): Admission: RE | Payer: Self-pay | Source: Home / Self Care

## 2021-10-22 ENCOUNTER — Ambulatory Visit (HOSPITAL_COMMUNITY): Admission: RE | Admit: 2021-10-22 | Payer: Medicare Other | Source: Home / Self Care | Admitting: Cardiology

## 2021-10-22 ENCOUNTER — Other Ambulatory Visit (HOSPITAL_COMMUNITY): Payer: Medicare Other | Admitting: Physician Assistant

## 2021-10-22 ENCOUNTER — Telehealth (HOSPITAL_COMMUNITY): Payer: Self-pay | Admitting: *Deleted

## 2021-10-22 SURGERY — CARDIOVERSION
Anesthesia: General

## 2021-10-22 NOTE — Telephone Encounter (Signed)
Patient called in this morning of his cardioversion stating he failed to secure transportation for the procedure. Cardioversion is canceled. Discussed with Adline Peals PA - will have patient keep scheduled follow up with Dr. Domenic Polite as this is closer to his home for reassessment/treatment plan as pt has issues with transportation.  ?

## 2021-10-23 DIAGNOSIS — D631 Anemia in chronic kidney disease: Secondary | ICD-10-CM | POA: Diagnosis not present

## 2021-10-23 DIAGNOSIS — N250 Renal osteodystrophy: Secondary | ICD-10-CM | POA: Diagnosis not present

## 2021-10-23 DIAGNOSIS — Z992 Dependence on renal dialysis: Secondary | ICD-10-CM | POA: Diagnosis not present

## 2021-10-23 DIAGNOSIS — N2581 Secondary hyperparathyroidism of renal origin: Secondary | ICD-10-CM | POA: Diagnosis not present

## 2021-10-23 DIAGNOSIS — Z20822 Contact with and (suspected) exposure to covid-19: Secondary | ICD-10-CM | POA: Diagnosis not present

## 2021-10-23 DIAGNOSIS — D509 Iron deficiency anemia, unspecified: Secondary | ICD-10-CM | POA: Diagnosis not present

## 2021-10-23 DIAGNOSIS — N186 End stage renal disease: Secondary | ICD-10-CM | POA: Diagnosis not present

## 2021-10-25 DIAGNOSIS — N2581 Secondary hyperparathyroidism of renal origin: Secondary | ICD-10-CM | POA: Diagnosis not present

## 2021-10-25 DIAGNOSIS — D509 Iron deficiency anemia, unspecified: Secondary | ICD-10-CM | POA: Diagnosis not present

## 2021-10-25 DIAGNOSIS — N186 End stage renal disease: Secondary | ICD-10-CM | POA: Diagnosis not present

## 2021-10-25 DIAGNOSIS — N250 Renal osteodystrophy: Secondary | ICD-10-CM | POA: Diagnosis not present

## 2021-10-25 DIAGNOSIS — D631 Anemia in chronic kidney disease: Secondary | ICD-10-CM | POA: Diagnosis not present

## 2021-10-25 DIAGNOSIS — Z992 Dependence on renal dialysis: Secondary | ICD-10-CM | POA: Diagnosis not present

## 2021-10-26 DIAGNOSIS — Z20822 Contact with and (suspected) exposure to covid-19: Secondary | ICD-10-CM | POA: Diagnosis not present

## 2021-10-27 DIAGNOSIS — Z992 Dependence on renal dialysis: Secondary | ICD-10-CM | POA: Diagnosis not present

## 2021-10-27 DIAGNOSIS — N186 End stage renal disease: Secondary | ICD-10-CM | POA: Diagnosis not present

## 2021-10-27 DIAGNOSIS — D509 Iron deficiency anemia, unspecified: Secondary | ICD-10-CM | POA: Diagnosis not present

## 2021-10-27 DIAGNOSIS — N2581 Secondary hyperparathyroidism of renal origin: Secondary | ICD-10-CM | POA: Diagnosis not present

## 2021-10-27 DIAGNOSIS — N250 Renal osteodystrophy: Secondary | ICD-10-CM | POA: Diagnosis not present

## 2021-10-27 DIAGNOSIS — D631 Anemia in chronic kidney disease: Secondary | ICD-10-CM | POA: Diagnosis not present

## 2021-10-29 ENCOUNTER — Ambulatory Visit (HOSPITAL_COMMUNITY): Payer: Medicare Other | Admitting: Physician Assistant

## 2021-10-30 DIAGNOSIS — D509 Iron deficiency anemia, unspecified: Secondary | ICD-10-CM | POA: Diagnosis not present

## 2021-10-30 DIAGNOSIS — Z992 Dependence on renal dialysis: Secondary | ICD-10-CM | POA: Diagnosis not present

## 2021-10-30 DIAGNOSIS — N250 Renal osteodystrophy: Secondary | ICD-10-CM | POA: Diagnosis not present

## 2021-10-30 DIAGNOSIS — N2581 Secondary hyperparathyroidism of renal origin: Secondary | ICD-10-CM | POA: Diagnosis not present

## 2021-10-30 DIAGNOSIS — D631 Anemia in chronic kidney disease: Secondary | ICD-10-CM | POA: Diagnosis not present

## 2021-10-30 DIAGNOSIS — N186 End stage renal disease: Secondary | ICD-10-CM | POA: Diagnosis not present

## 2021-11-01 DIAGNOSIS — D631 Anemia in chronic kidney disease: Secondary | ICD-10-CM | POA: Diagnosis not present

## 2021-11-01 DIAGNOSIS — N2581 Secondary hyperparathyroidism of renal origin: Secondary | ICD-10-CM | POA: Diagnosis not present

## 2021-11-01 DIAGNOSIS — N250 Renal osteodystrophy: Secondary | ICD-10-CM | POA: Diagnosis not present

## 2021-11-01 DIAGNOSIS — Z992 Dependence on renal dialysis: Secondary | ICD-10-CM | POA: Diagnosis not present

## 2021-11-01 DIAGNOSIS — D509 Iron deficiency anemia, unspecified: Secondary | ICD-10-CM | POA: Diagnosis not present

## 2021-11-01 DIAGNOSIS — N186 End stage renal disease: Secondary | ICD-10-CM | POA: Diagnosis not present

## 2021-11-03 DIAGNOSIS — D509 Iron deficiency anemia, unspecified: Secondary | ICD-10-CM | POA: Diagnosis not present

## 2021-11-03 DIAGNOSIS — Z992 Dependence on renal dialysis: Secondary | ICD-10-CM | POA: Diagnosis not present

## 2021-11-03 DIAGNOSIS — N186 End stage renal disease: Secondary | ICD-10-CM | POA: Diagnosis not present

## 2021-11-03 DIAGNOSIS — D631 Anemia in chronic kidney disease: Secondary | ICD-10-CM | POA: Diagnosis not present

## 2021-11-03 DIAGNOSIS — N2581 Secondary hyperparathyroidism of renal origin: Secondary | ICD-10-CM | POA: Diagnosis not present

## 2021-11-03 DIAGNOSIS — N250 Renal osteodystrophy: Secondary | ICD-10-CM | POA: Diagnosis not present

## 2021-11-05 DIAGNOSIS — Z20822 Contact with and (suspected) exposure to covid-19: Secondary | ICD-10-CM | POA: Diagnosis not present

## 2021-11-06 DIAGNOSIS — Z992 Dependence on renal dialysis: Secondary | ICD-10-CM | POA: Diagnosis not present

## 2021-11-06 DIAGNOSIS — D509 Iron deficiency anemia, unspecified: Secondary | ICD-10-CM | POA: Diagnosis not present

## 2021-11-06 DIAGNOSIS — N186 End stage renal disease: Secondary | ICD-10-CM | POA: Diagnosis not present

## 2021-11-06 DIAGNOSIS — N2581 Secondary hyperparathyroidism of renal origin: Secondary | ICD-10-CM | POA: Diagnosis not present

## 2021-11-06 DIAGNOSIS — D631 Anemia in chronic kidney disease: Secondary | ICD-10-CM | POA: Diagnosis not present

## 2021-11-06 DIAGNOSIS — N250 Renal osteodystrophy: Secondary | ICD-10-CM | POA: Diagnosis not present

## 2021-11-08 DIAGNOSIS — N186 End stage renal disease: Secondary | ICD-10-CM | POA: Diagnosis not present

## 2021-11-08 DIAGNOSIS — Z992 Dependence on renal dialysis: Secondary | ICD-10-CM | POA: Diagnosis not present

## 2021-11-08 DIAGNOSIS — N250 Renal osteodystrophy: Secondary | ICD-10-CM | POA: Diagnosis not present

## 2021-11-08 DIAGNOSIS — D509 Iron deficiency anemia, unspecified: Secondary | ICD-10-CM | POA: Diagnosis not present

## 2021-11-08 DIAGNOSIS — N2581 Secondary hyperparathyroidism of renal origin: Secondary | ICD-10-CM | POA: Diagnosis not present

## 2021-11-08 DIAGNOSIS — D631 Anemia in chronic kidney disease: Secondary | ICD-10-CM | POA: Diagnosis not present

## 2021-11-10 DIAGNOSIS — N2581 Secondary hyperparathyroidism of renal origin: Secondary | ICD-10-CM | POA: Diagnosis not present

## 2021-11-10 DIAGNOSIS — Z992 Dependence on renal dialysis: Secondary | ICD-10-CM | POA: Diagnosis not present

## 2021-11-10 DIAGNOSIS — D509 Iron deficiency anemia, unspecified: Secondary | ICD-10-CM | POA: Diagnosis not present

## 2021-11-10 DIAGNOSIS — N250 Renal osteodystrophy: Secondary | ICD-10-CM | POA: Diagnosis not present

## 2021-11-10 DIAGNOSIS — D631 Anemia in chronic kidney disease: Secondary | ICD-10-CM | POA: Diagnosis not present

## 2021-11-10 DIAGNOSIS — N186 End stage renal disease: Secondary | ICD-10-CM | POA: Diagnosis not present

## 2021-11-13 DIAGNOSIS — D631 Anemia in chronic kidney disease: Secondary | ICD-10-CM | POA: Diagnosis not present

## 2021-11-13 DIAGNOSIS — N250 Renal osteodystrophy: Secondary | ICD-10-CM | POA: Diagnosis not present

## 2021-11-13 DIAGNOSIS — N186 End stage renal disease: Secondary | ICD-10-CM | POA: Diagnosis not present

## 2021-11-13 DIAGNOSIS — Z992 Dependence on renal dialysis: Secondary | ICD-10-CM | POA: Diagnosis not present

## 2021-11-13 DIAGNOSIS — D509 Iron deficiency anemia, unspecified: Secondary | ICD-10-CM | POA: Diagnosis not present

## 2021-11-13 DIAGNOSIS — N2581 Secondary hyperparathyroidism of renal origin: Secondary | ICD-10-CM | POA: Diagnosis not present

## 2021-11-15 DIAGNOSIS — N250 Renal osteodystrophy: Secondary | ICD-10-CM | POA: Diagnosis not present

## 2021-11-15 DIAGNOSIS — D509 Iron deficiency anemia, unspecified: Secondary | ICD-10-CM | POA: Diagnosis not present

## 2021-11-15 DIAGNOSIS — Z992 Dependence on renal dialysis: Secondary | ICD-10-CM | POA: Diagnosis not present

## 2021-11-15 DIAGNOSIS — N2581 Secondary hyperparathyroidism of renal origin: Secondary | ICD-10-CM | POA: Diagnosis not present

## 2021-11-15 DIAGNOSIS — N186 End stage renal disease: Secondary | ICD-10-CM | POA: Diagnosis not present

## 2021-11-15 DIAGNOSIS — D631 Anemia in chronic kidney disease: Secondary | ICD-10-CM | POA: Diagnosis not present

## 2021-11-16 DIAGNOSIS — R059 Cough, unspecified: Secondary | ICD-10-CM | POA: Diagnosis not present

## 2021-11-16 DIAGNOSIS — Z20822 Contact with and (suspected) exposure to covid-19: Secondary | ICD-10-CM | POA: Diagnosis not present

## 2021-11-16 DIAGNOSIS — R051 Acute cough: Secondary | ICD-10-CM | POA: Diagnosis not present

## 2021-11-17 DIAGNOSIS — D509 Iron deficiency anemia, unspecified: Secondary | ICD-10-CM | POA: Diagnosis not present

## 2021-11-17 DIAGNOSIS — Z992 Dependence on renal dialysis: Secondary | ICD-10-CM | POA: Diagnosis not present

## 2021-11-17 DIAGNOSIS — D631 Anemia in chronic kidney disease: Secondary | ICD-10-CM | POA: Diagnosis not present

## 2021-11-17 DIAGNOSIS — N2581 Secondary hyperparathyroidism of renal origin: Secondary | ICD-10-CM | POA: Diagnosis not present

## 2021-11-17 DIAGNOSIS — N250 Renal osteodystrophy: Secondary | ICD-10-CM | POA: Diagnosis not present

## 2021-11-17 DIAGNOSIS — N186 End stage renal disease: Secondary | ICD-10-CM | POA: Diagnosis not present

## 2021-11-18 DIAGNOSIS — N186 End stage renal disease: Secondary | ICD-10-CM | POA: Diagnosis not present

## 2021-11-18 DIAGNOSIS — Z992 Dependence on renal dialysis: Secondary | ICD-10-CM | POA: Diagnosis not present

## 2021-11-18 NOTE — H&P (View-Only) (Signed)
? ? ?Cardiology Office Note ? ?Date: 11/19/2021  ? ?ID: Rodney Gordon, DOB 09-24-1955, MRN [id number] ? ?PCP:  Neale Burly, MD  ?Cardiologist:  Rozann Lesches, MD ?Electrophysiologist:  None  ? ?Chief Complaint  ?Patient presents with  ? Cardiac follow-up  ? ? ?History of Present Illness: ?Rodney Gordon is a 66 y.o. male last seen in the atrial fibrillation clinic in March, I reviewed the note (I last evaluated him in 2020 per chart review).  He was scheduled to undergo an elective cardioversion after that visit in March, procedure was to be on April 3 however patient canceled the procedure due to lack of adequate transportation. ? ?He is here today for follow-up.  Reports intermittent, recurring sense of palpitation and shortness of breath.  He remains in atrial fibrillation today and heart rate is not optimally controlled at rest. CHA2DS2-VASc score is 4 and he is prescribed Eliquis for stroke prophylaxis.  We went over his medications.  He states that he has been compliant with Eliquis since last being seen in the atrial fibrillation clinic, has not missed any doses.  Also taking amiodarone and Toprol-XL. ? ?Echocardiogram from January revealed LVEF 30 to 35% range, he has a history of HFrEF with nonischemic cardiomyopathy.  Left atrium severely dilated by that study as well. ? ?I talked with him today about setting up a cardioversion attempt at [facility] to see if we can get him back in sinus rhythm as this may be helpful in improving LVEF.  Left atrial size may make rhythm control challenging however.  He has been on amiodarone however.  I reviewed his lab work from March. ? ?He has ESRD and is undergoing hemodialysis at this point on Tuesdays, Thursdays, and Saturdays.  He sees Dr. Theador Hawthorne. ? ?Past Medical History:  ?Diagnosis Date  ? Anemia of chronic disease   ? Aortic insufficiency   ? Mild to moderate  ? Coronary atherosclerosis of native coronary artery   ? Nonobstructive at catheterization 2004  ?  ESRD on hemodialysis ([place])   ? Tuesdays, Thursdays, Saturdays  ? Essential hypertension   ? Peripheral vascular disease ([place])   ? ? ?Past Surgical History:  ?Procedure Laterality Date  ? A/V FISTULAGRAM Right 09/05/2017  ? Procedure: A/V FISTULAGRAM;  Surgeon: Angelia Mould, MD;  Location: [place] [facility];  Service: Cardiovascular;  Laterality: Right;  ? AV FISTULA PLACEMENT Right 12/17/2016  ? Procedure: ARTERIOVENOUS (AV) FISTULA CREATION- RIGHT BRACHIOCEPHALIC;  Surgeon: Elam Dutch, MD;  Location: [place];  Service: Vascular;  Laterality: Right;  ? CAPD INSERTION N/A 11/03/2017  ? Procedure: LAPAROSCOPIC INSERTION CONTINUOUS AMBULATORY PERITONEAL DIALYSIS  (CAPD) CATHETER;  Surgeon: Clovis Riley, MD;  Location: [place];  Service: General;  Laterality: N/A;  ? CARDIAC [facility]HETERIZATION    ? [facility]  ? IR DIALY SHUNT INTRO NEEDLE/INTRACATH INITIAL W/IMG RIGHT Right 04/24/2017  ? IR US GUIDE VASC ACCESS RIGHT  04/24/2017  ? PERIPHERAL VASCULAR BALLOON ANGIOPLASTY Right 09/05/2017  ? Procedure: PERIPHERAL VASCULAR BALLOON ANGIOPLASTY;  Surgeon: Angelia Mould, MD;  Location: [place] [facility];  Service: Cardiovascular;  Laterality: Right;  ? PROSTATE SURGERY    ? TIBIA FRACTURE SURGERY    ? Left  ? ? ?Current Outpatient Medications  ?Medication Sig Dispense Refill  ? acetaminophen (TYLENOL) 650 MG CR tablet Take 650 mg by mouth daily as needed for pain.    ? amiodarone (PACERONE) 200 MG tablet Take 1 tablet (200 mg total)  by mouth daily. 60 tablet 1  ? apixaban (ELIQUIS) 5 MG TABS tablet Take 1 tablet (5 mg total) by mouth 2 (two) times daily. 60 tablet 3  ? calcitRIOL (ROCALTROL) 0.5 MCG capsule Take 0.5 mcg by mouth 3 (three) times daily.    ? cinacalcet (SENSIPAR) 30 MG tablet Take 30 mg by mouth daily.    ? lidocaine-prilocaine (EMLA) cream Apply 1 application topically as needed (prior to dialysis).     ? methocarbamol (ROBAXIN) 500 MG tablet Take 500 mg by mouth at bedtime.    ?  nitroGLYCERIN (NITROSTAT) 0.4 MG SL tablet Place 1 tablet (0.4 mg total) under the tongue as directed. 25 tablet 2  ? pantoprazole (PROTONIX) 40 MG tablet Take 1 tablet (40 mg total) by mouth daily. 30 tablet 1  ? potassium chloride SA (KLOR-CON M) 20 MEQ tablet Take 20 mEq by mouth 3 (three) times daily.    ? sodium bicarbonate 650 MG tablet Take 1,300 mg by mouth 3 (three) times daily.     ? tamsulosin (FLOMAX) 0.4 MG CAPS capsule Take 1 capsule by mouth daily as needed.    ? metoprolol succinate (TOPROL XL) 50 MG 24 hr tablet Take 1.5 tablets (75 mg total) by mouth daily. 45 tablet 6  ? ?No current facility-administered medications for this visit.  ? ?Allergies:  Patient has no known allergies.  ? ?Social History: The patient  reports that he quit smoking about 23 years ago. His smoking use included cigarettes. He started smoking about 40 years ago. He has a 25.00 pack-year smoking history. He has never used smokeless tobacco. He reports that he does not drink alcohol and does not use drugs.  ? ?Family History: The patient's family history includes AAA (abdominal aortic aneurysm) in his mother; Cancer in an other family member; Cerebral aneurysm in his father; Coronary artery disease in an other family member; Heart disease in his mother; Hypertension in his father and mother.  ? ?ROS: Leg swelling.  No orthopnea or PND. ? ?Physical Exam: ?VS:  BP 138/82   Pulse (!) 127   Ht 6' (1.829 m)   Wt 140 lb 6.4 oz (63.7 kg)   SpO2 99%   BMI 19.04 kg/m? , BMI Body mass index is 19.04 kg/m?. ? ?Wt Readings from Last 3 Encounters:  ?11/19/21 140 lb 6.4 oz (63.7 kg)  ?10/12/21 138 lb (62.6 kg)  ?09/27/21 138 lb (62.6 kg)  ?  ?General: Patient appears comfortable at rest. ?HEENT: Conjunctiva and lids normal, oropharynx clear. ?Neck: Supple, no elevated JVP or carotid bruits, no thyromegaly. ?Lungs: Clear to auscultation, nonlabored breathing at rest. ?Cardiac: Irregularly irregular, no S3 or significant systolic murmur,  no pericardial rub. ?Abdomen: Soft, nontender, bowel sounds present. ?Extremities: No pitting edema, distal pulses 2+. ?Skin: Warm and dry. ?Musculoskeletal: No kyphosis. ?Neuropsychiatric: Alert and oriented x3, affect grossly appropriate. ? ?ECG:  An ECG dated 10/12/2021 was personally reviewed today and demonstrated:  Atrial fibrillation with poor R wave progression and nonspecific ST changes. ? ?Recent Labwork: ?05/12/2021: Magnesium 1.9 ?10/12/2021: ALT 24; AST 33; BUN 15; Creatinine, Ser 4.95; Hemoglobin 9.6; Platelets 325; Potassium 3.4; Sodium 137; TSH 4.029  ? ?Other Studies Reviewed Today: ? ?Echocardiogram 08/09/2021: ? 1. Left ventricular ejection fraction, by estimation, is 30 to 35%. The  ?left ventricle has moderately decreased function. The left ventricle  ?demonstrates global hypokinesis. There is mild left ventricular  ?hypertrophy. Left ventricular diastolic  ?parameters are indeterminate.  ? 2. Right ventricular systolic function  is normal. The right ventricular  ?size is normal. There is normal pulmonary artery systolic pressure.  ? 3. Left atrial size was severely dilated.  ? 4. The pericardial effusion is circumferential.  ? 5. The MR vena contracta is 0.37 cm. . The mitral valve is abnormal. Mild  ?to moderate mitral valve regurgitation. No evidence of mitral stenosis.  ? 6. The tricuspid valve is abnormal. Tricuspid valve regurgitation is  ?moderate.  ? 7. The aortic valve is tricuspid. There is mild calcification of the  ?aortic valve. There is mild thickening of the aortic valve. Aortic valve  ?regurgitation is moderate. No aortic stenosis is present.  ? 8. The inferior vena cava is normal in size with greater than 50%  ?respiratory variability, suggesting right atrial pressure of 3 mmHg.  ? ?Assessment and Plan: ? ?1.  Persistent atrial fibrillation with CHA2DS2-VASc score of 4.  He reports compliance with Eliquis since last visit in the atrial fibrillation clinic in March, no missed doses.   Also taking amiodarone 200 mg daily and Toprol-XL which will be increased to 75 mg daily for now (he will only take 50 mg in the morning of cardioversion).  We discussed setting up an elective cardioversion at [facility]

## 2021-11-18 NOTE — Progress Notes (Signed)
? ? ?Cardiology Office Note ? ?Date: 11/19/2021  ? ?ID: Rodney Gordon, DOB 1956/04/26, MRN [id number] ? ?PCP:  Neale Burly, MD  ?Cardiologist:  Rozann Lesches, MD ?Electrophysiologist:  None  ? ?Chief Complaint  ?Patient presents with  ? Cardiac follow-up  ? ? ?History of Present Illness: ?Rodney Gordon is a 66 y.o. male last seen in the atrial fibrillation clinic in March, I reviewed the note (I last evaluated him in 2020 per chart review).  He was scheduled to undergo an elective cardioversion after that visit in March, procedure was to be on April 3 however patient canceled the procedure due to lack of adequate transportation. ? ?He is here today for follow-up.  Reports intermittent, recurring sense of palpitation and shortness of breath.  He remains in atrial fibrillation today and heart rate is not optimally controlled at rest. CHA2DS2-VASc score is 4 and he is prescribed Eliquis for stroke prophylaxis.  We went over his medications.  He states that he has been compliant with Eliquis since last being seen in the atrial fibrillation clinic, has not missed any doses.  Also taking amiodarone and Toprol-XL. ? ?Echocardiogram from January revealed LVEF 30 to 35% range, he has a history of HFrEF with nonischemic cardiomyopathy.  Left atrium severely dilated by that study as well. ? ?I talked with him today about setting up a cardioversion attempt at [facility] to see if we can get him back in sinus rhythm as this may be helpful in improving LVEF.  Left atrial size may make rhythm control challenging however.  He has been on amiodarone however.  I reviewed his lab work from March. ? ?He has ESRD and is undergoing hemodialysis at this point on Tuesdays, Thursdays, and Saturdays.  He sees Dr. Theador Hawthorne. ? ?Past Medical History:  ?Diagnosis Date  ? Anemia of chronic disease   ? Aortic insufficiency   ? Mild to moderate  ? Coronary atherosclerosis of native coronary artery   ? Nonobstructive at catheterization 2004  ?  ESRD on hemodialysis ([place])   ? Tuesdays, Thursdays, Saturdays  ? Essential hypertension   ? Peripheral vascular disease ([place])   ? ? ?Past Surgical History:  ?Procedure Laterality Date  ? A/V FISTULAGRAM Right 09/05/2017  ? Procedure: A/V FISTULAGRAM;  Surgeon: Angelia Mould, MD;  Location: [place] [facility];  Service: Cardiovascular;  Laterality: Right;  ? AV FISTULA PLACEMENT Right 12/17/2016  ? Procedure: ARTERIOVENOUS (AV) FISTULA CREATION- RIGHT BRACHIOCEPHALIC;  Surgeon: Elam Dutch, MD;  Location: [place];  Service: Vascular;  Laterality: Right;  ? CAPD INSERTION N/A 11/03/2017  ? Procedure: LAPAROSCOPIC INSERTION CONTINUOUS AMBULATORY PERITONEAL DIALYSIS  (CAPD) CATHETER;  Surgeon: Clovis Riley, MD;  Location: [place];  Service: General;  Laterality: N/A;  ? CARDIAC CATHETERIZATION    ? [facility]  ? IR DIALY SHUNT INTRO NEEDLE/INTRACATH INITIAL W/IMG RIGHT Right 04/24/2017  ? IR US GUIDE VASC ACCESS RIGHT  04/24/2017  ? PERIPHERAL VASCULAR BALLOON ANGIOPLASTY Right 09/05/2017  ? Procedure: PERIPHERAL VASCULAR BALLOON ANGIOPLASTY;  Surgeon: Angelia Mould, MD;  Location: [place] [facility];  Service: Cardiovascular;  Laterality: Right;  ? PROSTATE SURGERY    ? TIBIA FRACTURE SURGERY    ? Left  ? ? ?Current Outpatient Medications  ?Medication Sig Dispense Refill  ? acetaminophen (TYLENOL) 650 MG CR tablet Take 650 mg by mouth daily as needed for pain.    ? amiodarone (PACERONE) 200 MG tablet Take 1 tablet (200 mg total)  by mouth daily. 60 tablet 1  ? apixaban (ELIQUIS) 5 MG TABS tablet Take 1 tablet (5 mg total) by mouth 2 (two) times daily. 60 tablet 3  ? calcitRIOL (ROCALTROL) 0.5 MCG capsule Take 0.5 mcg by mouth 3 (three) times daily.    ? cinacalcet (SENSIPAR) 30 MG tablet Take 30 mg by mouth daily.    ? lidocaine-prilocaine (EMLA) cream Apply 1 application topically as needed (prior to dialysis).     ? methocarbamol (ROBAXIN) 500 MG tablet Take 500 mg by mouth at bedtime.    ?  nitroGLYCERIN (NITROSTAT) 0.4 MG SL tablet Place 1 tablet (0.4 mg total) under the tongue as directed. 25 tablet 2  ? pantoprazole (PROTONIX) 40 MG tablet Take 1 tablet (40 mg total) by mouth daily. 30 tablet 1  ? potassium chloride SA (KLOR-CON M) 20 MEQ tablet Take 20 mEq by mouth 3 (three) times daily.    ? sodium bicarbonate 650 MG tablet Take 1,300 mg by mouth 3 (three) times daily.     ? tamsulosin (FLOMAX) 0.4 MG CAPS capsule Take 1 capsule by mouth daily as needed.    ? metoprolol succinate (TOPROL XL) 50 MG 24 hr tablet Take 1.5 tablets (75 mg total) by mouth daily. 45 tablet 6  ? ?No current facility-administered medications for this visit.  ? ?Allergies:  Patient has no known allergies.  ? ?Social History: The patient  reports that he quit smoking about 23 years ago. His smoking use included cigarettes. He started smoking about 40 years ago. He has a 25.00 pack-year smoking history. He has never used smokeless tobacco. He reports that he does not drink alcohol and does not use drugs.  ? ?Family History: The patient's family history includes AAA (abdominal aortic aneurysm) in his mother; Cancer in an other family member; Cerebral aneurysm in his father; Coronary artery disease in an other family member; Heart disease in his mother; Hypertension in his father and mother.  ? ?ROS: Leg swelling.  No orthopnea or PND. ? ?Physical Exam: ?VS:  BP 138/82   Pulse (!) 127   Ht 6' (1.829 m)   Wt 140 lb 6.4 oz (63.7 kg)   SpO2 99%   BMI 19.04 kg/m? , BMI Body mass index is 19.04 kg/m?. ? ?Wt Readings from Last 3 Encounters:  ?11/19/21 140 lb 6.4 oz (63.7 kg)  ?10/12/21 138 lb (62.6 kg)  ?09/27/21 138 lb (62.6 kg)  ?  ?General: Patient appears comfortable at rest. ?HEENT: Conjunctiva and lids normal, oropharynx clear. ?Neck: Supple, no elevated JVP or carotid bruits, no thyromegaly. ?Lungs: Clear to auscultation, nonlabored breathing at rest. ?Cardiac: Irregularly irregular, no S3 or significant systolic murmur,  no pericardial rub. ?Abdomen: Soft, nontender, bowel sounds present. ?Extremities: No pitting edema, distal pulses 2+. ?Skin: Warm and dry. ?Musculoskeletal: No kyphosis. ?Neuropsychiatric: Alert and oriented x3, affect grossly appropriate. ? ?ECG:  An ECG dated 10/12/2021 was personally reviewed today and demonstrated:  Atrial fibrillation with poor R wave progression and nonspecific ST changes. ? ?Recent Labwork: ?05/12/2021: Magnesium 1.9 ?10/12/2021: ALT 24; AST 33; BUN 15; Creatinine, Ser 4.95; Hemoglobin 9.6; Platelets 325; Potassium 3.4; Sodium 137; TSH 4.029  ? ?Other Studies Reviewed Today: ? ?Echocardiogram 08/09/2021: ? 1. Left ventricular ejection fraction, by estimation, is 30 to 35%. The  ?left ventricle has moderately decreased function. The left ventricle  ?demonstrates global hypokinesis. There is mild left ventricular  ?hypertrophy. Left ventricular diastolic  ?parameters are indeterminate.  ? 2. Right ventricular systolic function  is normal. The right ventricular  ?size is normal. There is normal pulmonary artery systolic pressure.  ? 3. Left atrial size was severely dilated.  ? 4. The pericardial effusion is circumferential.  ? 5. The MR vena contracta is 0.37 cm. . The mitral valve is abnormal. Mild  ?to moderate mitral valve regurgitation. No evidence of mitral stenosis.  ? 6. The tricuspid valve is abnormal. Tricuspid valve regurgitation is  ?moderate.  ? 7. The aortic valve is tricuspid. There is mild calcification of the  ?aortic valve. There is mild thickening of the aortic valve. Aortic valve  ?regurgitation is moderate. No aortic stenosis is present.  ? 8. The inferior vena cava is normal in size with greater than 50%  ?respiratory variability, suggesting right atrial pressure of 3 mmHg.  ? ?Assessment and Plan: ? ?1.  Persistent atrial fibrillation with CHA2DS2-VASc score of 4.  He reports compliance with Eliquis since last visit in the atrial fibrillation clinic in March, no missed doses.   Also taking amiodarone 200 mg daily and Toprol-XL which will be increased to 75 mg daily for now (he will only take 50 mg in the morning of cardioversion).  We discussed setting up an elective cardioversion at [facility]

## 2021-11-19 ENCOUNTER — Other Ambulatory Visit: Payer: Self-pay | Admitting: Cardiology

## 2021-11-19 ENCOUNTER — Ambulatory Visit (INDEPENDENT_AMBULATORY_CARE_PROVIDER_SITE_OTHER): Payer: Medicare Other | Admitting: Cardiology

## 2021-11-19 ENCOUNTER — Encounter: Payer: Self-pay | Admitting: Cardiology

## 2021-11-19 ENCOUNTER — Telehealth: Payer: Self-pay | Admitting: Cardiology

## 2021-11-19 ENCOUNTER — Encounter: Payer: Self-pay | Admitting: *Deleted

## 2021-11-19 VITALS — BP 138/82 | HR 127 | Ht 72.0 in | Wt 140.4 lb

## 2021-11-19 DIAGNOSIS — D631 Anemia in chronic kidney disease: Secondary | ICD-10-CM | POA: Diagnosis not present

## 2021-11-19 DIAGNOSIS — N250 Renal osteodystrophy: Secondary | ICD-10-CM | POA: Diagnosis not present

## 2021-11-19 DIAGNOSIS — Z992 Dependence on renal dialysis: Secondary | ICD-10-CM

## 2021-11-19 DIAGNOSIS — I4819 Other persistent atrial fibrillation: Secondary | ICD-10-CM

## 2021-11-19 DIAGNOSIS — E559 Vitamin D deficiency, unspecified: Secondary | ICD-10-CM | POA: Diagnosis not present

## 2021-11-19 DIAGNOSIS — I5020 Unspecified systolic (congestive) heart failure: Secondary | ICD-10-CM

## 2021-11-19 DIAGNOSIS — D509 Iron deficiency anemia, unspecified: Secondary | ICD-10-CM | POA: Diagnosis not present

## 2021-11-19 DIAGNOSIS — N186 End stage renal disease: Secondary | ICD-10-CM

## 2021-11-19 DIAGNOSIS — I10 Essential (primary) hypertension: Secondary | ICD-10-CM | POA: Diagnosis not present

## 2021-11-19 DIAGNOSIS — Z23 Encounter for immunization: Secondary | ICD-10-CM | POA: Diagnosis not present

## 2021-11-19 DIAGNOSIS — N2581 Secondary hyperparathyroidism of renal origin: Secondary | ICD-10-CM | POA: Diagnosis not present

## 2021-11-19 MED ORDER — METOPROLOL SUCCINATE ER 50 MG PO TB24
75.0000 mg | ORAL_TABLET | Freq: Every day | ORAL | 6 refills | Status: AC
Start: 2021-11-19 — End: ?

## 2021-11-19 NOTE — Patient Instructions (Signed)
Medication Instructions:  ?Increase Toprol XL to 75mg  daily ?Continue all other medications.    ? ?Labwork: ?none ? ?Testing/Procedures: ?Your physician has recommended that you have a Cardioversion (DCCV). Electrical Cardioversion uses a jolt of electricity to your heart either through paddles or wired patches attached to your chest. This is a controlled, usually prescheduled, procedure. Defibrillation is done under light anesthesia in the hospital, and you usually go home the day of the procedure. This is done to get your heart back into a normal rhythm. You are not awake for the procedure. Please see the instruction sheet given to you today.  ? ?Follow-Up: ?6 weeks ? ?Any Other Special Instructions Will Be Listed Below (If Applicable). ? ? ?If you need a refill on your cardiac medications before your next appointment, please call your pharmacy. ? ?

## 2021-11-19 NOTE — Telephone Encounter (Signed)
PERCERT: ? ?DCCV - scheduled for Monday, 5/8 - 9:30 - [place]  @ [facility]  ?

## 2021-11-20 DIAGNOSIS — N250 Renal osteodystrophy: Secondary | ICD-10-CM | POA: Diagnosis not present

## 2021-11-20 DIAGNOSIS — N186 End stage renal disease: Secondary | ICD-10-CM | POA: Diagnosis not present

## 2021-11-20 DIAGNOSIS — D509 Iron deficiency anemia, unspecified: Secondary | ICD-10-CM | POA: Diagnosis not present

## 2021-11-20 DIAGNOSIS — Z23 Encounter for immunization: Secondary | ICD-10-CM | POA: Diagnosis not present

## 2021-11-20 DIAGNOSIS — N2581 Secondary hyperparathyroidism of renal origin: Secondary | ICD-10-CM | POA: Diagnosis not present

## 2021-11-20 DIAGNOSIS — Z992 Dependence on renal dialysis: Secondary | ICD-10-CM | POA: Diagnosis not present

## 2021-11-22 DIAGNOSIS — N2581 Secondary hyperparathyroidism of renal origin: Secondary | ICD-10-CM | POA: Diagnosis not present

## 2021-11-22 DIAGNOSIS — D509 Iron deficiency anemia, unspecified: Secondary | ICD-10-CM | POA: Diagnosis not present

## 2021-11-22 DIAGNOSIS — Z992 Dependence on renal dialysis: Secondary | ICD-10-CM | POA: Diagnosis not present

## 2021-11-22 DIAGNOSIS — Z23 Encounter for immunization: Secondary | ICD-10-CM | POA: Diagnosis not present

## 2021-11-22 DIAGNOSIS — N250 Renal osteodystrophy: Secondary | ICD-10-CM | POA: Diagnosis not present

## 2021-11-22 DIAGNOSIS — N186 End stage renal disease: Secondary | ICD-10-CM | POA: Diagnosis not present

## 2021-11-22 NOTE — Patient Instructions (Addendum)
? ? ANVAY TENNIS ? 11/22/2021  ?  ? @PREFPERIOPPHARMACY @ ? ? Your procedure is scheduled on 11/26/2021. ? Report to [facility] at 8:00 A.M. ? Call this number if you have problems the morning of surgery: ? [phone] ? ? Remember: ? Do not eat or drink after midnight. ?   ? Take these medicines the morning of surgery with A SIP OF WATER : Pacerone, Sensipar, Robaxin and Flomax ?  ? Do not wear jewelry, make-up or nail polish. ? Do not wear lotions, powders, or perfumes, or deodorant. ? Do not shave 48 hours prior to surgery.  Men may shave face and neck. ? Do not bring valuables to the hospital. ? [facility] is not responsible for any belongings or valuables. ? ?Contacts, dentures or bridgework may not be worn into surgery.  Leave your suitcase in the car.  After surgery it may be brought to your room. ? ?For patients admitted to the hospital, discharge time will be determined by your treatment team. ? ?Patients discharged the day of surgery will not be allowed to drive home.  ? ?Name and phone number of your driver:   family ?Special instructions:  N/A ? ?Please read over the following fact sheets that you were given. ?Care and Recovery After Surgery ?  ? ?Electrical Cardioversion ?Electrical cardioversion is the delivery of a jolt of electricity to restore a normal rhythm to the heart. A rhythm that is too fast or is not regular keeps the heart from pumping well. In this procedure, sticky patches or metal paddles are placed on the chest to deliver electricity to the heart from a device. ?This procedure may be done in an emergency if: ?There is low or no blood pressure as a result of the heart rhythm. ?Normal rhythm must be restored as fast as possible to protect the brain and heart from further damage. ?It may save a life. ?This may also be a scheduled procedure for irregular or fast heart rhythms that are not immediately life-threatening. ?Tell a health care provider about: ?Any allergies you have. ?All  medicines you are taking, including vitamins, herbs, eye drops, creams, and over-the-counter medicines. ?Any problems you or family members have had with anesthetic medicines. ?Any blood disorders you have. ?Any surgeries you have had. ?Any medical conditions you have. ?Whether you are pregnant or may be pregnant. ?What are the risks? ?Generally, this is a safe procedure. However, problems may occur, including: ?Allergic reactions to medicines. ?A blood clot that breaks free and travels to other parts of your body. ?The possible return of an abnormal heart rhythm within hours or days after the procedure. ?Your heart stopping (cardiac arrest). This is rare. ?What happens before the procedure? ?Medicines ?Your health care provider may have you start taking: ?Blood-thinning medicines (anticoagulants) so your blood does not clot as easily. ?Medicines to help stabilize your heart rate and rhythm. ?Ask your health care provider about: ?Changing or stopping your regular medicines. This is especially important if you are taking diabetes medicines or blood thinners. ?Taking medicines such as aspirin and ibuprofen. These medicines can thin your blood. Do not take these medicines unless your health care provider tells you to take them. ?Taking over-the-counter medicines, vitamins, herbs, and supplements. ?General instructions ?Follow instructions from your health care provider about eating or drinking restrictions. ?Plan to have someone take you home from the hospital or clinic. ?If you will be going home right after the procedure, plan to have someone with you for  24 hours. ?Ask your health care provider what steps will be taken to help prevent infection. These may include washing your skin with a germ-killing soap. ?What happens during the procedure? ? ?An IV will be inserted into one of your veins. ?Sticky patches (electrodes) or metal paddles may be placed on your chest. ?You will be given a medicine to help you relax  (sedative). ?An electrical shock will be delivered. ?The procedure may vary among health care providers and hospitals. ?What can I expect after the procedure? ?Your blood pressure, heart rate, breathing rate, and blood oxygen level will be monitored until you leave the hospital or clinic. ?Your heart rhythm will be watched to make sure it does not change. ?You may have some redness on the skin where the shocks were given. ?Follow these instructions at home: ?Do not drive for 24 hours if you were given a sedative during your procedure. ?Take over-the-counter and prescription medicines only as told by your health care provider. ?Ask your health care provider how to check your pulse. Check it often. ?Rest for 48 hours after the procedure or as told by your health care provider. ?Avoid or limit your caffeine use as told by your health care provider. ?Keep all follow-up visits as told by your health care provider. This is important. ?Contact a health care provider if: ?You feel like your heart is beating too quickly or your pulse is not regular. ?You have a serious muscle cramp that does not go away. ?Get help right away if: ?You have discomfort in your chest. ?You are dizzy or you feel faint. ?You have trouble breathing or you are short of breath. ?Your speech is slurred. ?You have trouble moving an arm or leg on one side of your body. ?Your fingers or toes turn cold or blue. ?Summary ?Electrical cardioversion is the delivery of a jolt of electricity to restore a normal rhythm to the heart. ?This procedure may be done right away in an emergency or may be a scheduled procedure if the condition is not an emergency. ?Generally, this is a safe procedure. ?After the procedure, check your pulse often as told by your health care provider. ?This information is not intended to replace advice given to you by your health care provider. Make sure you discuss any questions you have with your health care provider. ?Document Revised:  06/07/2021 Document Reviewed: 02/08/2019 ?Elsevier Patient Education ? [place]. ? ? ? ?  ?

## 2021-11-23 ENCOUNTER — Encounter (HOSPITAL_COMMUNITY)
Admission: RE | Admit: 2021-11-23 | Discharge: 2021-11-23 | Disposition: A | Discharge status: Home / Self Care | Payer: Medicare Other | Source: Ambulatory Visit | Attending: Cardiovascular Disease | Admitting: Cardiovascular Disease

## 2021-11-24 DIAGNOSIS — Z23 Encounter for immunization: Secondary | ICD-10-CM | POA: Diagnosis not present

## 2021-11-24 DIAGNOSIS — N250 Renal osteodystrophy: Secondary | ICD-10-CM | POA: Diagnosis not present

## 2021-11-24 DIAGNOSIS — Z992 Dependence on renal dialysis: Secondary | ICD-10-CM | POA: Diagnosis not present

## 2021-11-24 DIAGNOSIS — D509 Iron deficiency anemia, unspecified: Secondary | ICD-10-CM | POA: Diagnosis not present

## 2021-11-24 DIAGNOSIS — N186 End stage renal disease: Secondary | ICD-10-CM | POA: Diagnosis not present

## 2021-11-24 DIAGNOSIS — N2581 Secondary hyperparathyroidism of renal origin: Secondary | ICD-10-CM | POA: Diagnosis not present

## 2021-11-26 ENCOUNTER — Encounter (HOSPITAL_COMMUNITY): Payer: Self-pay | Admitting: Cardiovascular Disease

## 2021-11-26 ENCOUNTER — Ambulatory Visit (HOSPITAL_COMMUNITY): Payer: Medicare Other | Admitting: Certified Registered"

## 2021-11-26 ENCOUNTER — Ambulatory Visit (HOSPITAL_BASED_OUTPATIENT_CLINIC_OR_DEPARTMENT_OTHER): Payer: Medicare Other | Admitting: Certified Registered"

## 2021-11-26 ENCOUNTER — Ambulatory Visit (HOSPITAL_COMMUNITY)
Admission: RE | Admit: 2021-11-26 | Discharge: 2021-11-26 | Disposition: A | Discharge status: Home / Self Care | Payer: Medicare Other | Attending: Cardiovascular Disease | Admitting: Cardiovascular Disease

## 2021-11-26 ENCOUNTER — Encounter (HOSPITAL_COMMUNITY)
Admission: RE | Disposition: A | Discharge status: Home / Self Care | Payer: Self-pay | Source: Home / Self Care | Attending: Cardiovascular Disease

## 2021-11-26 DIAGNOSIS — I5022 Chronic systolic (congestive) heart failure: Secondary | ICD-10-CM | POA: Diagnosis not present

## 2021-11-26 DIAGNOSIS — Z79899 Other long term (current) drug therapy: Secondary | ICD-10-CM | POA: Diagnosis not present

## 2021-11-26 DIAGNOSIS — I739 Peripheral vascular disease, unspecified: Secondary | ICD-10-CM | POA: Diagnosis not present

## 2021-11-26 DIAGNOSIS — I4891 Unspecified atrial fibrillation: Secondary | ICD-10-CM

## 2021-11-26 DIAGNOSIS — I4819 Other persistent atrial fibrillation: Secondary | ICD-10-CM | POA: Insufficient documentation

## 2021-11-26 DIAGNOSIS — I132 Hypertensive heart and chronic kidney disease with heart failure and with stage 5 chronic kidney disease, or end stage renal disease: Secondary | ICD-10-CM | POA: Insufficient documentation

## 2021-11-26 DIAGNOSIS — I428 Other cardiomyopathies: Secondary | ICD-10-CM | POA: Diagnosis not present

## 2021-11-26 DIAGNOSIS — Z20822 Contact with and (suspected) exposure to covid-19: Secondary | ICD-10-CM | POA: Diagnosis not present

## 2021-11-26 DIAGNOSIS — N186 End stage renal disease: Secondary | ICD-10-CM | POA: Diagnosis not present

## 2021-11-26 DIAGNOSIS — Z87891 Personal history of nicotine dependence: Secondary | ICD-10-CM | POA: Insufficient documentation

## 2021-11-26 DIAGNOSIS — I351 Nonrheumatic aortic (valve) insufficiency: Secondary | ICD-10-CM | POA: Diagnosis not present

## 2021-11-26 DIAGNOSIS — Z7901 Long term (current) use of anticoagulants: Secondary | ICD-10-CM | POA: Diagnosis not present

## 2021-11-26 DIAGNOSIS — I120 Hypertensive chronic kidney disease with stage 5 chronic kidney disease or end stage renal disease: Secondary | ICD-10-CM

## 2021-11-26 DIAGNOSIS — Z992 Dependence on renal dialysis: Secondary | ICD-10-CM

## 2021-11-26 DIAGNOSIS — I2510 Atherosclerotic heart disease of native coronary artery without angina pectoris: Secondary | ICD-10-CM

## 2021-11-26 HISTORY — PX: CARDIOVERSION: SHX1299

## 2021-11-26 LAB — COMPREHENSIVE METABOLIC PANEL
ALT: 18 U/L (ref 0–44)
AST: 29 U/L (ref 15–41)
Albumin: 2.7 g/dL — ABNORMAL LOW (ref 3.5–5.0)
Alkaline Phosphatase: 120 U/L (ref 38–126)
Anion gap: 8 (ref 5–15)
BUN: 28 mg/dL — ABNORMAL HIGH (ref 8–23)
CO2: 30 mmol/L (ref 22–32)
Calcium: 10 mg/dL (ref 8.9–10.3)
Chloride: 98 mmol/L (ref 98–111)
Creatinine, Ser: 5.82 mg/dL — ABNORMAL HIGH (ref 0.61–1.24)
GFR, Estimated: 10 mL/min — ABNORMAL LOW (ref >60)
Glucose, Bld: 110 mg/dL — ABNORMAL HIGH (ref 70–99)
Potassium: 3.2 mmol/L — ABNORMAL LOW (ref 3.5–5.1)
Sodium: 136 mmol/L (ref 135–145)
Total Bilirubin: 2.8 mg/dL — ABNORMAL HIGH (ref 0.3–1.2)
Total Protein: 7.8 g/dL (ref 6.5–8.1)

## 2021-11-26 SURGERY — CARDIOVERSION
Anesthesia: General

## 2021-11-26 MED ORDER — LIDOCAINE HCL (PF) 2 % IJ SOLN
INTRAMUSCULAR | Status: AC
  Filled 2021-11-26: qty 5

## 2021-11-26 MED ORDER — SODIUM CHLORIDE 0.9 % IV SOLN: INTRAVENOUS | Status: DC

## 2021-11-26 MED ORDER — LIDOCAINE HCL (CARDIAC) PF 100 MG/5ML IV SOSY
PREFILLED_SYRINGE | INTRAVENOUS | Status: DC | PRN
  Administered 2021-11-26: 50 mg via INTRAVENOUS

## 2021-11-26 MED ORDER — SUCCINYLCHOLINE CHLORIDE 200 MG/10ML IV SOSY
PREFILLED_SYRINGE | INTRAVENOUS | Status: AC
  Filled 2021-11-26: qty 10

## 2021-11-26 MED ORDER — PROPOFOL 10 MG/ML IV BOLUS
INTRAVENOUS | Status: DC | PRN
  Administered 2021-11-26: 50 mg via INTRAVENOUS

## 2021-11-26 NOTE — Transfer of Care (Signed)
Immediate Anesthesia Transfer of Care Note ? ?Patient: Rodney Gordon ? ?Procedure(s) Performed: Procedure(s): ?CARDIOVERSION (N/A) ? ?Patient Location: PACU ? ?Anesthesia Type:MAC ? ?Level of Consciousness:  sedated, patient cooperative and responds to stimulation ? ?Airway & Oxygen Therapy:Patient Spontanous Breathing and Patient connected to face mask oxgen ? ?Post-op Assessment:  Report given to PACU RN and Post -op Vital signs reviewed and stable ? ?Post vital signs:  Reviewed and stable ? ?Last Vitals:  ?Vitals:  ? 11/26/21 0900 11/26/21 0915  ?BP: (!) 142/101   ?Resp: (!) 28 19  ?Temp:    ?SpO2:    ? ? ?Complications: No apparent anesthesia complications ? ?

## 2021-11-26 NOTE — Anesthesia Preprocedure Evaluation (Signed)
Anesthesia Evaluation  ?Patient identified by MRN, date of birth, ID band ?Patient awake ? ? ? ?Reviewed: ?Allergy & Precautions, H&P , NPO status , Patient's Chart, lab work & pertinent test results, reviewed documented beta blocker date and time  ? ?Airway ?Mallampati: II ? ?TM Distance: >3 FB ?Neck ROM: full ? ? ? Dental ?no notable dental hx. ? ?  ?Pulmonary ?neg pulmonary ROS, former smoker,  ?  ?Pulmonary exam normal ?breath sounds clear to auscultation ? ? ? ? ? ? Cardiovascular ?Exercise Tolerance: Good ?hypertension, + CAD and + Peripheral Vascular Disease  ?Atrial Fibrillation + Valvular Problems/Murmurs AI  ?Rhythm:irregular Rate:Normal ?+ Systolic murmurs ? ?  ?Neuro/Psych ?negative neurological ROS ? negative psych ROS  ? GI/Hepatic ?negative GI ROS, Neg liver ROS,   ?Endo/Other  ?negative endocrine ROS ? Renal/GU ?CRF and ESRFRenal disease  ?negative genitourinary ?  ?Musculoskeletal ? ? Abdominal ?  ?Peds ? Hematology ? ?(+) Blood dyscrasia, anemia ,   ?Anesthesia Other Findings ? ? Reproductive/Obstetrics ?negative OB ROS ? ?  ? ? ? ? ? ? ? ? ? ? ? ? ? ?  ?  ? ? ? ? ? ? ? ? ?Anesthesia Physical ?Anesthesia Plan ? ?ASA: 3 ? ?Anesthesia Plan: General  ? ?Post-op Pain Management:   ? ?Induction:  ? ?PONV Risk Score and Plan: Propofol infusion and Midazolam ? ?Airway Management Planned:  ? ?Additional Equipment:  ? ?Intra-op Plan:  ? ?Post-operative Plan:  ? ?Informed Consent: I have reviewed the patients History and Physical, chart, labs and discussed the procedure including the risks, benefits and alternatives for the proposed anesthesia with the patient or authorized representative who has indicated his/her understanding and acceptance.  ? ? ? ?Dental Advisory Given ? ?Plan Discussed with: CRNA ? ?Anesthesia Plan Comments:   ? ? ? ? ? ? ?Anesthesia Quick Evaluation ? ?

## 2021-11-26 NOTE — Interval H&P Note (Signed)
History and Physical Interval Note: ? ?11/26/2021 ?9:34 AM ? ?Rodney Gordon  has presented today for surgery, with the diagnosis of a-fib.  The various methods of treatment have been discussed with the patient and family. After consideration of risks, benefits and other options for treatment, the patient has consented to  Procedure(s): ?CARDIOVERSION (N/A) as a surgical intervention.  The patient's history has been reviewed, patient examined, no change in status, stable for surgery.  I have reviewed the patient's chart and labs.  Questions were answered to the patient's satisfaction.   ? ?NPO for DCCV. No missed doses >3 weeks. On eliquis.  ? ?Lake Bells T. Audie Box, MD, [facility] ?[address]  ?[address], [address] ?[place], [place] [address] ?[phone]  ?9:34 AM ? ? ? ?

## 2021-11-26 NOTE — Progress Notes (Signed)
Electrical Cardioversion Procedure Note ?Rodney Gordon ?[id number] ?1955-11-28 ? ?Procedure: Electrical Cardioversion ?Indications:  Atrial Fibrillation ? ?Procedure Details ?Consent: Risks of procedure as well as the alternatives and risks of each were explained to the (patient/caregiver).  Consent for procedure obtained. ?Time Out: Verified patient identification, verified procedure, site/side was marked, verified correct patient position, special equipment/implants available, medications/allergies/relevent history reviewed, required imaging and test results available.  Performed ? ?Patient placed on cardiac monitor, pulse oximetry, supplemental oxygen as necessary.  ?Sedation given: Benzodiazepines ?Pacer pads placed anterior and posterior chest. ? ?Cardioverted 1 time(s).  ?Cardioverted at 200J. ? ?Evaluation ?Findings: Post procedure EKG shows: NSR ?Complications: None ?Patient did tolerate procedure well. ? ? ?Rodney Gordon ?11/26/2021, 9:52 AM ? ? ? ?

## 2021-11-26 NOTE — Anesthesia Postprocedure Evaluation (Signed)
Anesthesia Post Note ? ?Patient: Rodney Gordon ? ?Procedure(s) Performed: CARDIOVERSION ? ?Patient location during evaluation: Phase II ?Anesthesia Type: General ?Level of consciousness: awake ?Pain management: pain level controlled ?Vital Signs Assessment: post-procedure vital signs reviewed and stable ?Respiratory status: spontaneous breathing and respiratory function stable ?Cardiovascular status: blood pressure returned to baseline and stable ?Postop Assessment: no headache and no apparent nausea or vomiting ?Anesthetic complications: no ?Comments: Late entry ? ? ?No notable events documented. ? ? ?Last Vitals:  ?Vitals:  ? 11/26/21 1030 11/26/21 1055  ?BP: 140/83 (!) 149/81  ?Pulse: 66 69  ?Resp: 17 18  ?Temp:  36.4 ?C  ?SpO2: 100% 100%  ?  ?Last Pain:  ?Vitals:  ? 11/26/21 1055  ?TempSrc: Oral  ?PainSc: 0-No pain  ? ? ?  ?  ?  ?  ?  ?  ? ?Louann Sjogren ? ? ? ? ?

## 2021-11-26 NOTE — CV Procedure (Signed)
? ?  DIRECT CURRENT CARDIOVERSION ? ?NAME:  Rodney Gordon    ?MRN: [id number] ?DOB:  [birth year]    ?ADMIT DATE: 11/26/2021 ? ?Indication:  Symptomatic atrial fibrillation ? ?Procedure Note:  The patient signed informed consent.  They have had had therapeutic anticoagulation with eliquis greater than 3 weeks.  Anesthesia was administered by Dr. Briant Cedar.  Adequate airway was maintained throughout and vital followed per protocol.  They were cardioverted x 1 with 200J of biphasic synchronized energy.  They converted to NSR.  There were no apparent complications.  The patient had normal neuro status and respiratory status post procedure with vitals stable as recorded elsewhere.   ? ?Follow up: They will continue on current medical therapy and follow up with cardiology as scheduled. ? ?[place] Bells T. Audie Box, MD, [facility] ?[address]  [address] ?[place], [place] [address] ?[phone]  ?9:45 AM ? ?

## 2021-11-27 ENCOUNTER — Telehealth: Payer: Self-pay | Admitting: Cardiology

## 2021-11-27 ENCOUNTER — Encounter (HOSPITAL_COMMUNITY): Payer: Self-pay | Admitting: Cardiovascular Disease

## 2021-11-27 DIAGNOSIS — N2581 Secondary hyperparathyroidism of renal origin: Secondary | ICD-10-CM | POA: Diagnosis not present

## 2021-11-27 DIAGNOSIS — N250 Renal osteodystrophy: Secondary | ICD-10-CM | POA: Diagnosis not present

## 2021-11-27 DIAGNOSIS — D509 Iron deficiency anemia, unspecified: Secondary | ICD-10-CM | POA: Diagnosis not present

## 2021-11-27 DIAGNOSIS — Z23 Encounter for immunization: Secondary | ICD-10-CM | POA: Diagnosis not present

## 2021-11-27 DIAGNOSIS — N186 End stage renal disease: Secondary | ICD-10-CM | POA: Diagnosis not present

## 2021-11-27 DIAGNOSIS — Z992 Dependence on renal dialysis: Secondary | ICD-10-CM | POA: Diagnosis not present

## 2021-11-27 NOTE — Telephone Encounter (Signed)
Per CV Procedure note dated 11/26/2021 by Dr. O'Neal-continue on current medical therapy  ? ?Patient informed and verbalized understanding of plan. ?

## 2021-11-27 NOTE — Telephone Encounter (Signed)
After his procedure patient calling to see when/f if he supposed to still take his medication. Please advise  ?

## 2021-11-29 DIAGNOSIS — Z23 Encounter for immunization: Secondary | ICD-10-CM | POA: Diagnosis not present

## 2021-11-29 DIAGNOSIS — N2581 Secondary hyperparathyroidism of renal origin: Secondary | ICD-10-CM | POA: Diagnosis not present

## 2021-11-29 DIAGNOSIS — N186 End stage renal disease: Secondary | ICD-10-CM | POA: Diagnosis not present

## 2021-11-29 DIAGNOSIS — D509 Iron deficiency anemia, unspecified: Secondary | ICD-10-CM | POA: Diagnosis not present

## 2021-11-29 DIAGNOSIS — N250 Renal osteodystrophy: Secondary | ICD-10-CM | POA: Diagnosis not present

## 2021-11-29 DIAGNOSIS — Z992 Dependence on renal dialysis: Secondary | ICD-10-CM | POA: Diagnosis not present

## 2021-12-01 DIAGNOSIS — Z23 Encounter for immunization: Secondary | ICD-10-CM | POA: Diagnosis not present

## 2021-12-01 DIAGNOSIS — N2581 Secondary hyperparathyroidism of renal origin: Secondary | ICD-10-CM | POA: Diagnosis not present

## 2021-12-01 DIAGNOSIS — Z992 Dependence on renal dialysis: Secondary | ICD-10-CM | POA: Diagnosis not present

## 2021-12-01 DIAGNOSIS — N186 End stage renal disease: Secondary | ICD-10-CM | POA: Diagnosis not present

## 2021-12-01 DIAGNOSIS — D509 Iron deficiency anemia, unspecified: Secondary | ICD-10-CM | POA: Diagnosis not present

## 2021-12-01 DIAGNOSIS — N250 Renal osteodystrophy: Secondary | ICD-10-CM | POA: Diagnosis not present

## 2021-12-03 DIAGNOSIS — N2581 Secondary hyperparathyroidism of renal origin: Secondary | ICD-10-CM | POA: Diagnosis not present

## 2021-12-03 DIAGNOSIS — N186 End stage renal disease: Secondary | ICD-10-CM | POA: Diagnosis not present

## 2021-12-03 DIAGNOSIS — Z23 Encounter for immunization: Secondary | ICD-10-CM | POA: Diagnosis not present

## 2021-12-03 DIAGNOSIS — Z992 Dependence on renal dialysis: Secondary | ICD-10-CM | POA: Diagnosis not present

## 2021-12-03 DIAGNOSIS — D509 Iron deficiency anemia, unspecified: Secondary | ICD-10-CM | POA: Diagnosis not present

## 2021-12-03 DIAGNOSIS — N250 Renal osteodystrophy: Secondary | ICD-10-CM | POA: Diagnosis not present

## 2021-12-20 DEATH — deceased

## 2021-12-27 ENCOUNTER — Ambulatory Visit: Payer: Medicare Other | Admitting: Student

## 2022-09-10 IMAGING — CT CT ABD-PELV W/O CM
2 of 4 series · 15 of 46 positions shown, 17 images · non-contrast
Comparison: CT 04/11/2021

CLINICAL DATA: Dialysis nausea vomiting diarrhea

EXAM:
CT ABDOMEN AND PELVIS WITHOUT CONTRAST
TECHNIQUE: Multidetector CT imaging of the abdomen and pelvis was performed
following the standard protocol without IV contrast.

[Series 2: routine abd/pel wo · axial · 0.78mm/px · z∈[-983,-568]mm · 12 of 95 slices shown, 14 images]
[im 8/95  soft-tissue]
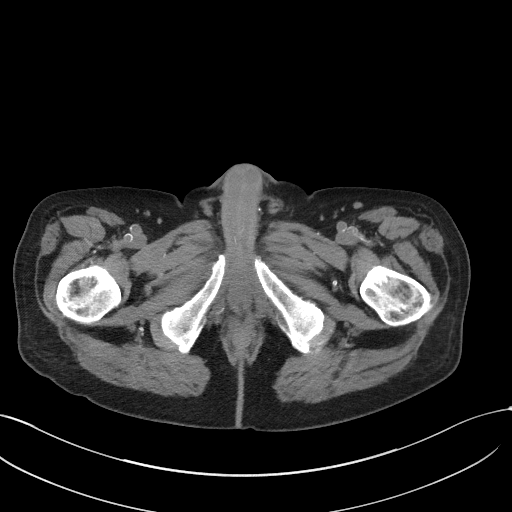
[im 8/95  bone]
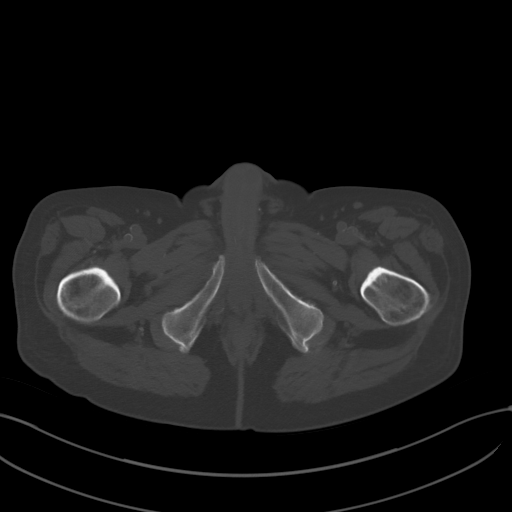
[im 16/95  soft-tissue]
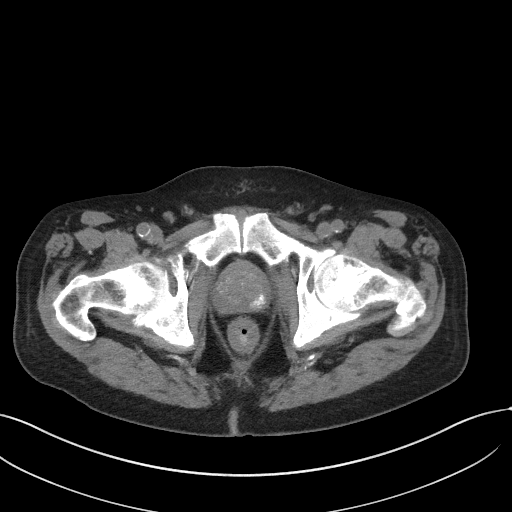
[im 23/95  soft-tissue]
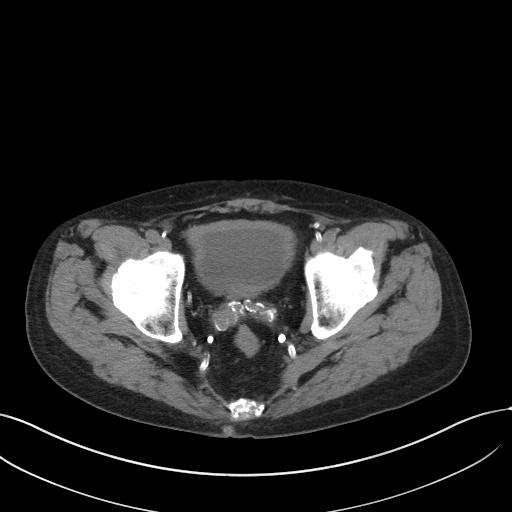
[im 31/95  soft-tissue]
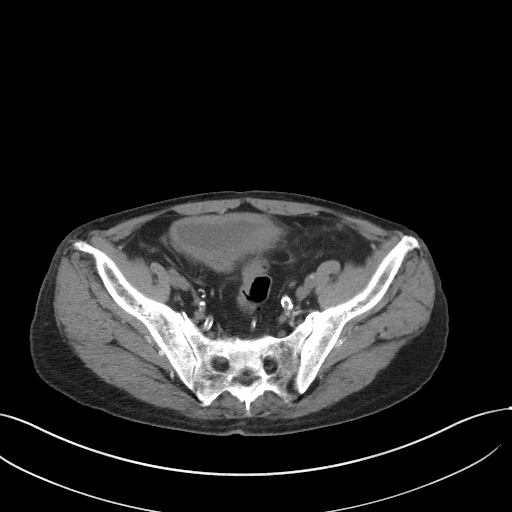
[im 38/95  soft-tissue]
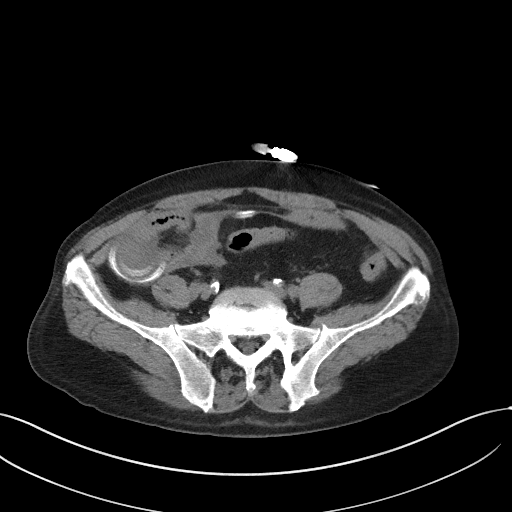
[im 46/95  soft-tissue]
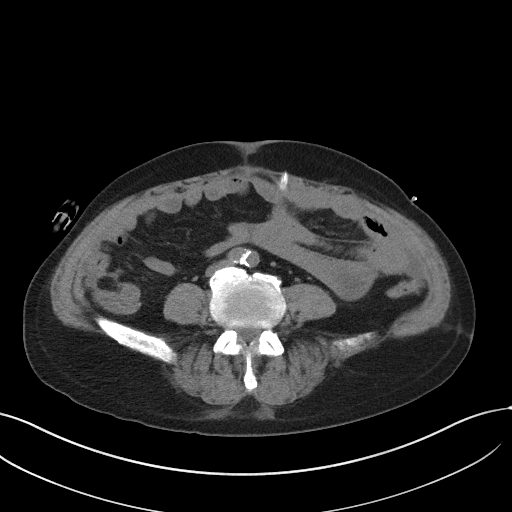
[im 53/95  soft-tissue]
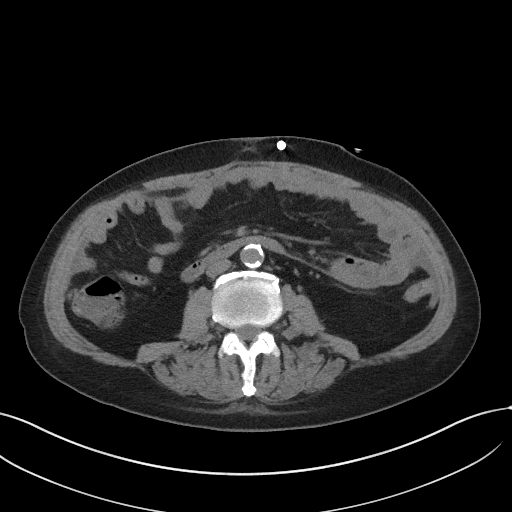
[im 61/95  soft-tissue]
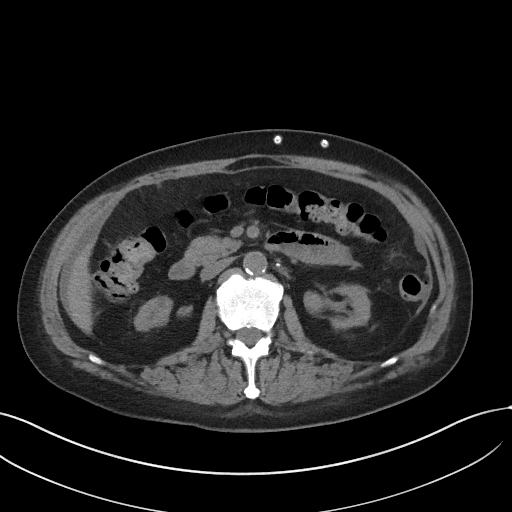
[im 68/95  soft-tissue]
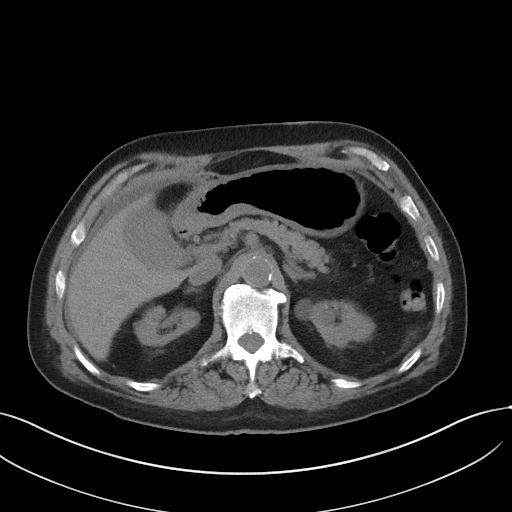
[im 68/95  bone]
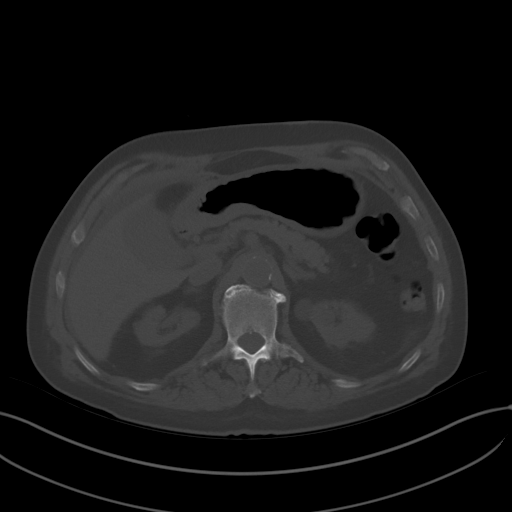
[im 76/95  soft-tissue]
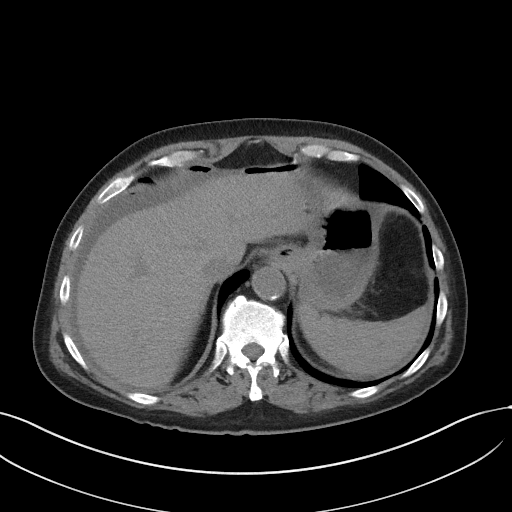
[im 83/95  soft-tissue]
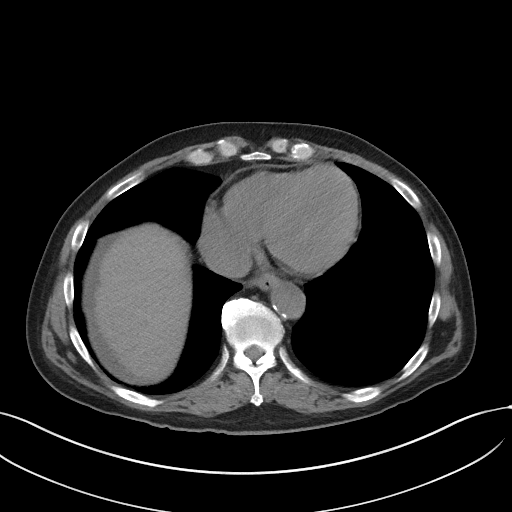
[im 91/95  soft-tissue]
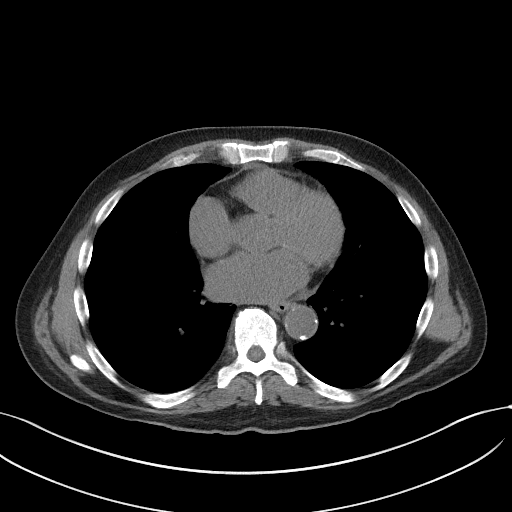

[Series 5: coronal st · coronal · 0.74mm/px · 3 of 78 slices shown]
[im 26/78  soft-tissue]
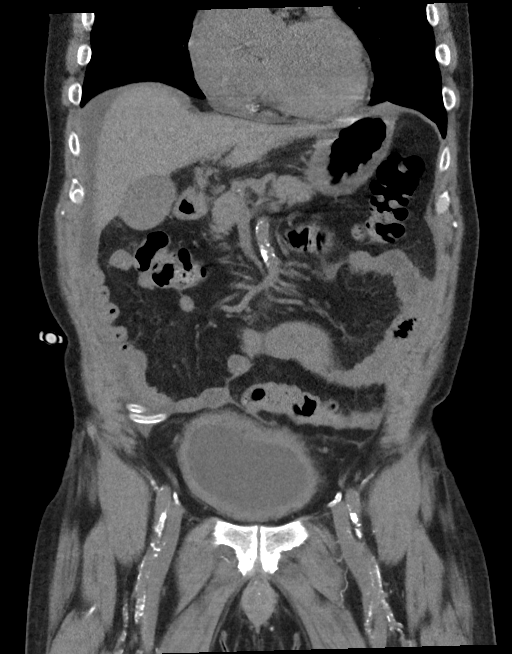
[im 35/78  soft-tissue]
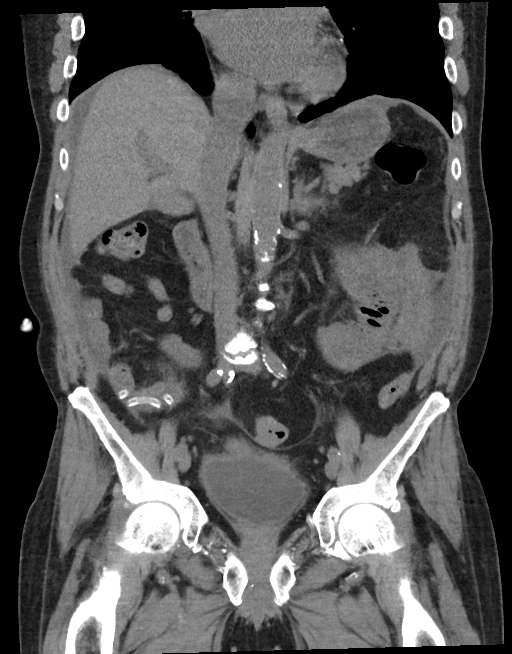
[im 43/78  soft-tissue]
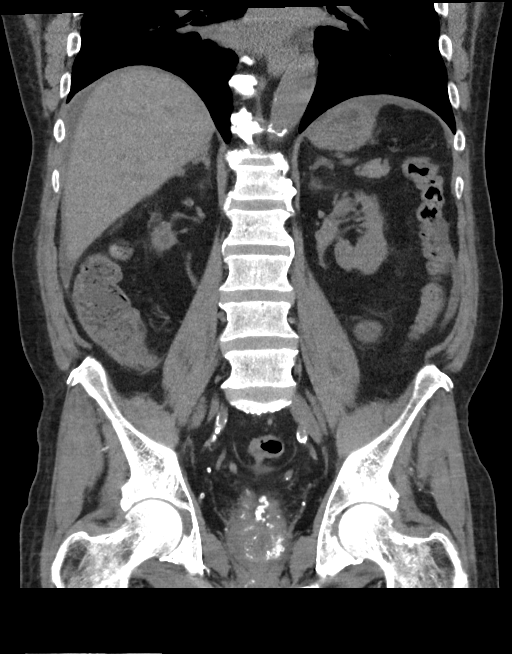

[15 of 46 positions shown; findings below may reference images not displayed]

FINDINGS: Lower chest: Lung bases demonstrate no acute consolidation or
effusion. Coronary vascular calcification. Trace pericardial
effusion.

Hepatobiliary: No calcified gallstone. No focal hepatic abnormality.
No biliary dilatation.

Pancreas: Unremarkable. No pancreatic ductal dilatation or
surrounding inflammatory changes.

Spleen: Normal in size without focal abnormality.

Adrenals/Urinary Tract: Adrenal glands are within normal limits.
Atrophic kidneys without hydronephrosis. Stable exophytic cyst upper
pole left kidney. Small stones and intrarenal vascular calcification
on the right. Mild dilatation of left renal collecting system with
possible hyperdense material in the left renal pelvis. No
obstructing stone. Thick-walled urinary bladder with stranding.

Stomach/Bowel: Stomach nonenlarged. No dilated small bowel. No acute
bowel wall thickening. Partially visualized negative appendix.

Vascular/Lymphatic: Advanced aortic atherosclerosis. No aneurysm. No
suspicious nodes

Reproductive: Coarse prostate calcifications. Calcified seminal
vesicles.

Other: Small amount of free fluid surrounding the liver and spleen
and also within the right lower quadrant. Small amount of free air
in the upper abdomen. Peritoneal dialysis catheter coiled in the
right lower quadrant.

Musculoskeletal: No acute or suspicious osseous abnormality.
IMPRESSION: 1. Negative for bowel obstruction or bowel inflammatory process.
2. Peritoneal dialysis catheter coiled in the right lower quadrant.
There is small amount of free fluid in the upper abdomen as well as
the right lower quadrant in addition to small amount of
pneumoperitoneum which is felt related to peritoneal dialysis
catheter. These findings have been present on the comparison
studies.
3. Atrophic native kidneys. Small stones in the right kidney.
Slightly dilated left renal collecting system with possible
hyperdense material or tissue within the left renal collecting
system, nonemergent follow-up contrasted exam may be considered.
4. Slightly thick-walled urinary bladder with mild
stranding/perivesical inflammatory change

## 2022-12-12 IMAGING — DX DG CHEST 1V PORT
1 series · 1 of 1 positions shown · non-contrast
Comparison: Chest x-ray dated June 08, 2021

CLINICAL DATA: Weakness

EXAM:
PORTABLE CHEST 1 VIEW

[chest ap]
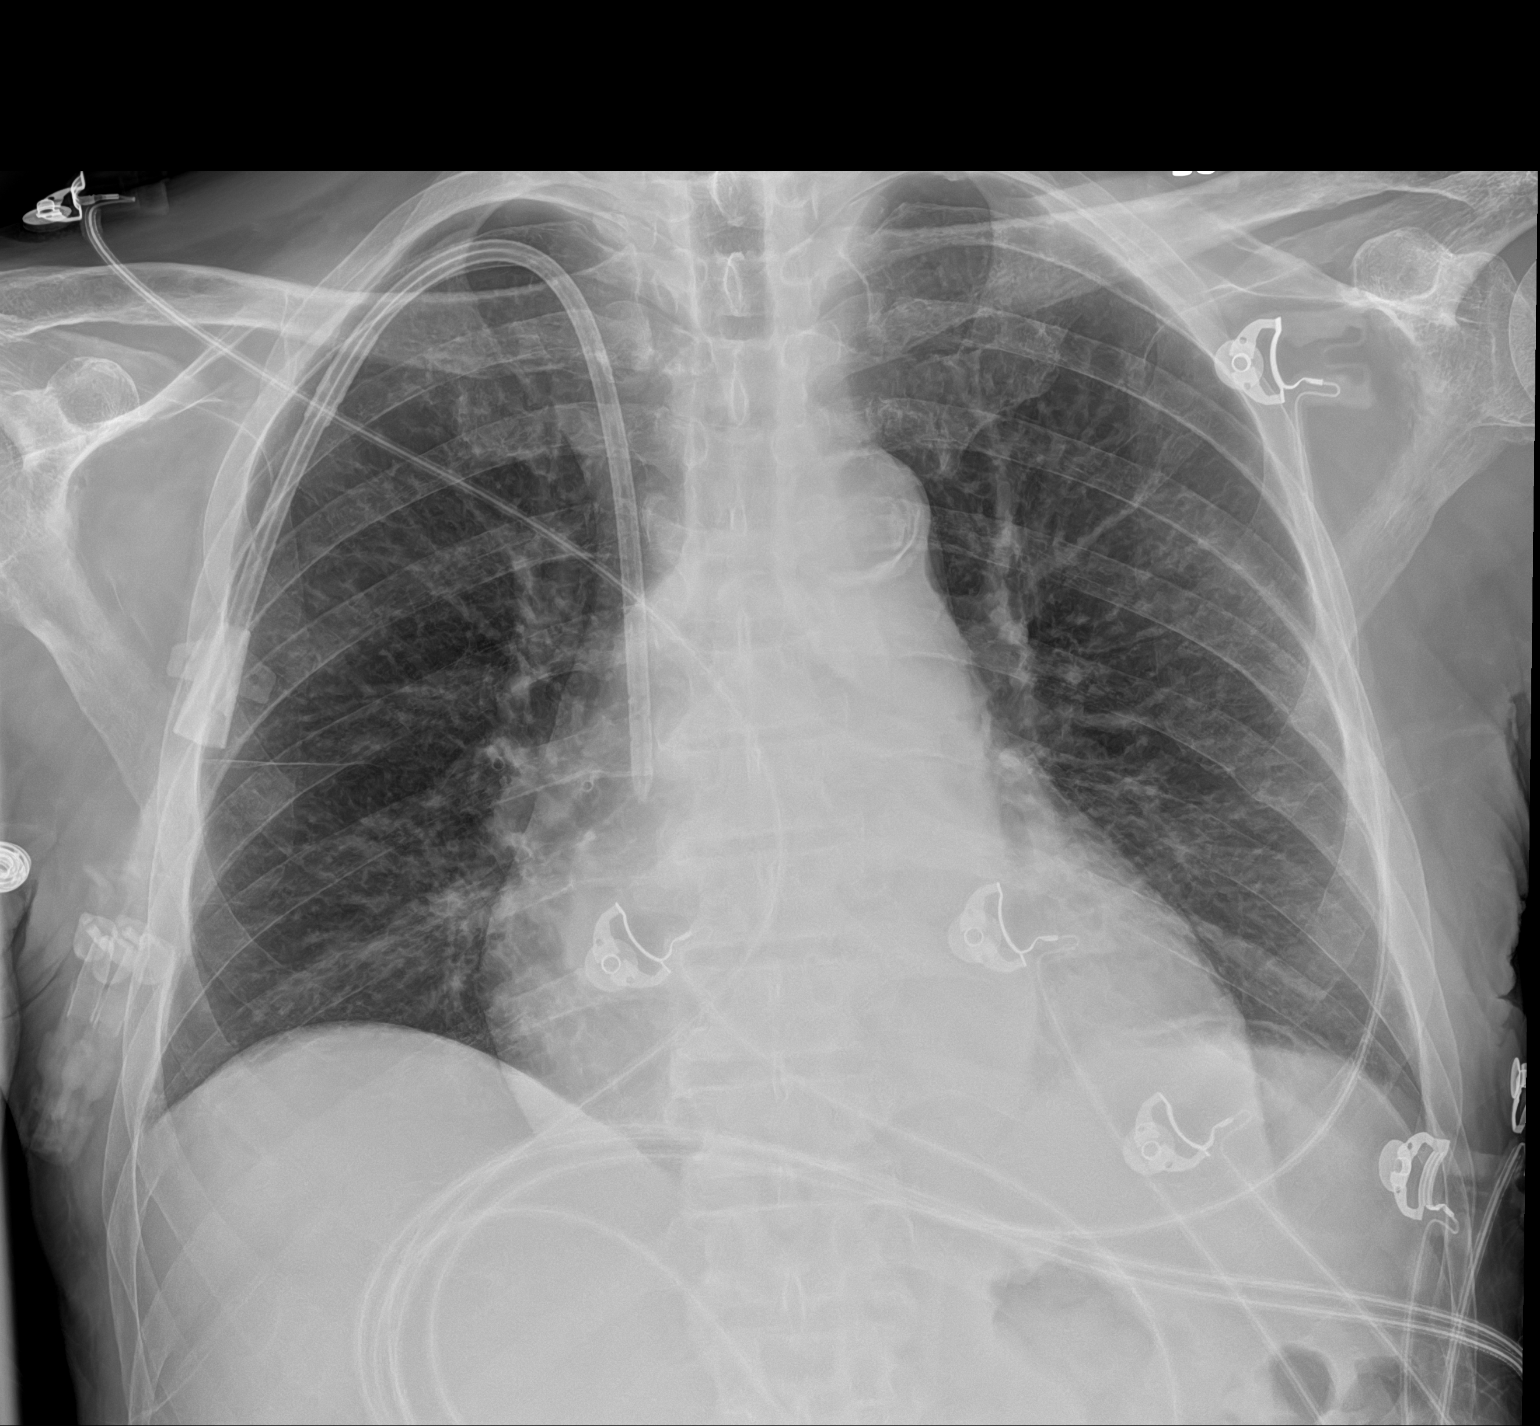

[1 of 1 positions shown; findings below may reference images not displayed]

FINDINGS: Cardiac and mediastinal contours are unchanged. Right central venous
catheter with tip positioned near the superior cavoatrial junction.
Left basilar atelectasis. No large pleural effusion or evidence of
pneumothorax.
IMPRESSION: No active disease.
